# Patient Record
Sex: Male | Born: 1960 | Race: White | Hispanic: No | Smoking: Current every day smoker
Health system: Southern US, Community
[De-identification: ages and names within clinical notes are randomized; demographics above are authoritative.]

## PROBLEM LIST (undated history)

## (undated) DIAGNOSIS — F17200 Nicotine dependence, unspecified, uncomplicated: Secondary | ICD-10-CM

## (undated) DIAGNOSIS — Z8639 Personal history of other endocrine, nutritional and metabolic disease: Secondary | ICD-10-CM

## (undated) DIAGNOSIS — Z862 Personal history of diseases of the blood and blood-forming organs and certain disorders involving the immune mechanism: Secondary | ICD-10-CM

## (undated) DIAGNOSIS — N183 Chronic kidney disease, stage 3 (moderate): Secondary | ICD-10-CM

## (undated) DIAGNOSIS — I2510 Atherosclerotic heart disease of native coronary artery without angina pectoris: Secondary | ICD-10-CM

## (undated) DIAGNOSIS — E7800 Pure hypercholesterolemia, unspecified: Secondary | ICD-10-CM

## (undated) DIAGNOSIS — E782 Mixed hyperlipidemia: Secondary | ICD-10-CM

## (undated) DIAGNOSIS — I219 Acute myocardial infarction, unspecified: Secondary | ICD-10-CM

## (undated) DIAGNOSIS — E119 Type 2 diabetes mellitus without complications: Secondary | ICD-10-CM

## (undated) DIAGNOSIS — I10 Essential (primary) hypertension: Secondary | ICD-10-CM

## (undated) DIAGNOSIS — K219 Gastro-esophageal reflux disease without esophagitis: Secondary | ICD-10-CM

## (undated) DIAGNOSIS — E118 Type 2 diabetes mellitus with unspecified complications: Secondary | ICD-10-CM

## (undated) DIAGNOSIS — I639 Cerebral infarction, unspecified: Secondary | ICD-10-CM

## (undated) DIAGNOSIS — I739 Peripheral vascular disease, unspecified: Secondary | ICD-10-CM

## (undated) DIAGNOSIS — M1990 Unspecified osteoarthritis, unspecified site: Secondary | ICD-10-CM

## (undated) DIAGNOSIS — D649 Anemia, unspecified: Secondary | ICD-10-CM

## (undated) HISTORY — DX: Personal history of diseases of the blood and blood-forming organs and certain disorders involving the immune mechanism: Z86.2

## (undated) HISTORY — DX: Cerebral infarction, unspecified: I63.9

## (undated) HISTORY — DX: Atherosclerotic heart disease of native coronary artery without angina pectoris: I25.10

## (undated) HISTORY — DX: Personal history of other endocrine, nutritional and metabolic disease: Z86.39

## (undated) HISTORY — DX: Acute myocardial infarction, unspecified: I21.9

## (undated) HISTORY — PX: TONSILLECTOMY: SUR1361

## (undated) HISTORY — DX: Chronic kidney disease, stage 3 (moderate): N18.3

## (undated) HISTORY — DX: Anemia, unspecified: D64.9

## (undated) HISTORY — DX: Type 2 diabetes mellitus with unspecified complications: E11.8

## (undated) HISTORY — PX: KNEE ARTHROSCOPY: SHX127

## (undated) HISTORY — DX: Unspecified osteoarthritis, unspecified site: M19.90

## (undated) HISTORY — DX: Nicotine dependence, unspecified, uncomplicated: F17.200

## (undated) HISTORY — PX: BELOW KNEE LEG AMPUTATION: SUR23

## (undated) HISTORY — DX: Type 2 diabetes mellitus without complications: E11.9

## (undated) HISTORY — PX: TOE AMPUTATION: SHX809

## (undated) HISTORY — DX: Peripheral vascular disease, unspecified: I73.9

## (undated) HISTORY — DX: Mixed hyperlipidemia: E78.2

## (undated) HISTORY — DX: Pure hypercholesterolemia, unspecified: E78.00

## (undated) HISTORY — PX: HAND SURGERY: SHX662

## (undated) HISTORY — DX: Essential (primary) hypertension: I10

---

## 2004-08-08 DIAGNOSIS — I219 Acute myocardial infarction, unspecified: Secondary | ICD-10-CM

## 2004-08-08 HISTORY — DX: Acute myocardial infarction, unspecified: I21.9

## 2004-08-08 HISTORY — PX: CARDIAC CATHETERIZATION: SHX172

## 2009-03-13 ENCOUNTER — Emergency Department (HOSPITAL_COMMUNITY): Admission: EM | Admit: 2009-03-13 | Discharge: 2009-03-13 | Payer: Self-pay | Admitting: Emergency Medicine

## 2009-03-14 ENCOUNTER — Ambulatory Visit (HOSPITAL_COMMUNITY): Admission: RE | Admit: 2009-03-14 | Discharge: 2009-03-14 | Payer: Self-pay | Admitting: Emergency Medicine

## 2009-03-14 ENCOUNTER — Ambulatory Visit: Payer: Self-pay | Admitting: Vascular Surgery

## 2009-03-14 ENCOUNTER — Encounter (INDEPENDENT_AMBULATORY_CARE_PROVIDER_SITE_OTHER): Payer: Self-pay | Admitting: Emergency Medicine

## 2009-06-22 ENCOUNTER — Encounter: Admission: RE | Admit: 2009-06-22 | Discharge: 2009-08-06 | Payer: Self-pay | Admitting: Orthopedic Surgery

## 2009-08-12 ENCOUNTER — Encounter: Admission: RE | Admit: 2009-08-12 | Discharge: 2009-11-10 | Payer: Self-pay | Admitting: Orthopedic Surgery

## 2010-10-27 DIAGNOSIS — I252 Old myocardial infarction: Secondary | ICD-10-CM | POA: Insufficient documentation

## 2012-12-20 DIAGNOSIS — Z955 Presence of coronary angioplasty implant and graft: Secondary | ICD-10-CM | POA: Insufficient documentation

## 2014-09-29 DIAGNOSIS — M899 Disorder of bone, unspecified: Secondary | ICD-10-CM | POA: Insufficient documentation

## 2014-09-29 DIAGNOSIS — M79644 Pain in right finger(s): Secondary | ICD-10-CM | POA: Insufficient documentation

## 2016-09-20 DIAGNOSIS — Z8673 Personal history of transient ischemic attack (TIA), and cerebral infarction without residual deficits: Secondary | ICD-10-CM | POA: Insufficient documentation

## 2017-04-24 DIAGNOSIS — E1140 Type 2 diabetes mellitus with diabetic neuropathy, unspecified: Secondary | ICD-10-CM | POA: Insufficient documentation

## 2017-04-24 DIAGNOSIS — Z794 Long term (current) use of insulin: Secondary | ICD-10-CM | POA: Insufficient documentation

## 2017-05-12 HISTORY — PX: FEMORAL-POPLITEAL BYPASS GRAFT: SHX937

## 2017-06-15 DIAGNOSIS — D649 Anemia, unspecified: Secondary | ICD-10-CM | POA: Insufficient documentation

## 2017-06-15 DIAGNOSIS — D62 Acute posthemorrhagic anemia: Secondary | ICD-10-CM | POA: Insufficient documentation

## 2017-06-15 DIAGNOSIS — R778 Other specified abnormalities of plasma proteins: Secondary | ICD-10-CM | POA: Insufficient documentation

## 2017-06-15 DIAGNOSIS — I5032 Chronic diastolic (congestive) heart failure: Secondary | ICD-10-CM | POA: Insufficient documentation

## 2017-09-15 DIAGNOSIS — K922 Gastrointestinal hemorrhage, unspecified: Secondary | ICD-10-CM | POA: Insufficient documentation

## 2017-10-12 DIAGNOSIS — K5903 Drug induced constipation: Secondary | ICD-10-CM | POA: Insufficient documentation

## 2017-10-12 DIAGNOSIS — F17200 Nicotine dependence, unspecified, uncomplicated: Secondary | ICD-10-CM | POA: Insufficient documentation

## 2017-10-12 DIAGNOSIS — K611 Rectal abscess: Secondary | ICD-10-CM | POA: Insufficient documentation

## 2017-10-16 DIAGNOSIS — K610 Anal abscess: Secondary | ICD-10-CM | POA: Insufficient documentation

## 2018-02-01 DIAGNOSIS — M6282 Rhabdomyolysis: Secondary | ICD-10-CM | POA: Insufficient documentation

## 2018-02-01 DIAGNOSIS — E876 Hypokalemia: Secondary | ICD-10-CM | POA: Insufficient documentation

## 2018-04-10 DIAGNOSIS — R9439 Abnormal result of other cardiovascular function study: Secondary | ICD-10-CM | POA: Insufficient documentation

## 2018-06-07 DIAGNOSIS — Z2820 Immunization not carried out because of patient decision for unspecified reason: Secondary | ICD-10-CM | POA: Insufficient documentation

## 2018-06-08 DIAGNOSIS — G8929 Other chronic pain: Secondary | ICD-10-CM | POA: Insufficient documentation

## 2018-09-26 ENCOUNTER — Ambulatory Visit: Payer: Self-pay | Admitting: Osteopathic Medicine

## 2018-10-11 ENCOUNTER — Encounter: Payer: Self-pay | Admitting: Osteopathic Medicine

## 2018-10-11 ENCOUNTER — Ambulatory Visit (INDEPENDENT_AMBULATORY_CARE_PROVIDER_SITE_OTHER): Payer: Medicare HMO | Admitting: Osteopathic Medicine

## 2018-10-11 DIAGNOSIS — N183 Chronic kidney disease, stage 3 unspecified: Secondary | ICD-10-CM

## 2018-10-11 DIAGNOSIS — F17200 Nicotine dependence, unspecified, uncomplicated: Secondary | ICD-10-CM

## 2018-10-11 DIAGNOSIS — I10 Essential (primary) hypertension: Secondary | ICD-10-CM

## 2018-10-11 DIAGNOSIS — Z794 Long term (current) use of insulin: Secondary | ICD-10-CM

## 2018-10-11 DIAGNOSIS — E782 Mixed hyperlipidemia: Secondary | ICD-10-CM | POA: Diagnosis not present

## 2018-10-11 DIAGNOSIS — I739 Peripheral vascular disease, unspecified: Secondary | ICD-10-CM

## 2018-10-11 DIAGNOSIS — E118 Type 2 diabetes mellitus with unspecified complications: Secondary | ICD-10-CM

## 2018-10-11 DIAGNOSIS — M1990 Unspecified osteoarthritis, unspecified site: Secondary | ICD-10-CM | POA: Diagnosis not present

## 2018-10-11 DIAGNOSIS — Z862 Personal history of diseases of the blood and blood-forming organs and certain disorders involving the immune mechanism: Secondary | ICD-10-CM | POA: Diagnosis not present

## 2018-10-11 DIAGNOSIS — I2510 Atherosclerotic heart disease of native coronary artery without angina pectoris: Secondary | ICD-10-CM | POA: Diagnosis not present

## 2018-10-11 DIAGNOSIS — N184 Chronic kidney disease, stage 4 (severe): Secondary | ICD-10-CM | POA: Insufficient documentation

## 2018-10-11 HISTORY — DX: Chronic kidney disease, stage 3 unspecified: N18.30

## 2018-10-11 HISTORY — DX: Personal history of diseases of the blood and blood-forming organs and certain disorders involving the immune mechanism: Z86.2

## 2018-10-11 HISTORY — DX: Mixed hyperlipidemia: E78.2

## 2018-10-11 HISTORY — DX: Peripheral vascular disease, unspecified: I73.9

## 2018-10-11 HISTORY — DX: Nicotine dependence, unspecified, uncomplicated: F17.200

## 2018-10-11 HISTORY — DX: Essential (primary) hypertension: I10

## 2018-10-11 HISTORY — DX: Atherosclerotic heart disease of native coronary artery without angina pectoris: I25.10

## 2018-10-11 HISTORY — DX: Type 2 diabetes mellitus with unspecified complications: E11.8

## 2018-10-11 MED ORDER — ASPIRIN 81 MG PO TABS: 81.0000 mg | ORAL_TABLET | Freq: Every day | ORAL | 3 refills | Status: AC

## 2018-10-11 MED ORDER — FERROUS SULFATE 325 (65 FE) MG PO TABS: 325.0000 mg | ORAL_TABLET | Freq: Two times a day (BID) | ORAL | 3 refills | Status: DC

## 2018-10-11 MED ORDER — PANTOPRAZOLE SODIUM 40 MG PO TBEC: 40.0000 mg | DELAYED_RELEASE_TABLET | Freq: Every day | ORAL | 3 refills | Status: DC

## 2018-10-11 MED ORDER — FUROSEMIDE 20 MG PO TABS: 20.0000 mg | ORAL_TABLET | Freq: Every day | ORAL | 3 refills | Status: DC

## 2018-10-11 MED ORDER — CLOPIDOGREL BISULFATE 75 MG PO TABS: 75.0000 mg | ORAL_TABLET | Freq: Every day | ORAL | 3 refills | Status: DC

## 2018-10-11 MED ORDER — CILOSTAZOL 100 MG PO TABS: 100.0000 mg | ORAL_TABLET | Freq: Two times a day (BID) | ORAL | 3 refills | Status: DC

## 2018-10-11 MED ORDER — POTASSIUM CHLORIDE ER 10 MEQ PO TBCR: 10.0000 meq | EXTENDED_RELEASE_TABLET | Freq: Every day | ORAL | 3 refills | Status: DC

## 2018-10-11 MED ORDER — OXYCODONE HCL 10 MG PO TABS: 5.0000 mg | ORAL_TABLET | Freq: Four times a day (QID) | ORAL | 0 refills | Status: DC | PRN

## 2018-10-11 NOTE — Patient Instructions (Signed)
Plan:  Please return to the lab downstairs for fasting blood work sometime within the next week.    Based on those results, we may think about adjusting your medications, particularly adding a statin medicine for cholesterol and prevention of heart attack/stroke, to others to prevent heart attack / stroke and reduce risk of complications from uncontrolled blood pressure.  Refills on chronic medications were sent to [facility].  You should have a years worth of everything.  Sent a refill of the pain medication locally to [place].  Let us plan to see you back in a couple of weeks after we adjust your medications, we can recheck your blood pressure and you lab results in detail.  After that, as long as you are feeling well, can probably see me every 6 months or so, or as needed if anything new comes up.  Thanks for coming in today, and please let me know if there is anything else I can do for you!

## 2018-10-11 NOTE — Progress Notes (Signed)
HPI: Joseph Grimes is a 58 y.o. male who  has a past medical history of CKD (chronic kidney disease) stage 3, GFR 30-59 ml/min ([place]) (10/11/2018), Coronary artery disease (10/11/2018), Diabetes ([place]), Diabetes mellitus type 2, controlled, with complications ([place]) (10/09/1962), Essential hypertension (10/11/2018), Heart attack ([place]), High cholesterol, History of anemia (10/11/2018), History of low potassium, Low hemoglobin, Mixed hyperlipidemia (10/11/2018), Osteoarthritis (10/11/2018), Peripheral vascular disease ([place]) (10/11/2018), Stroke ([place]), and Tobacco dependence (10/11/2018).  he presents to [facility] today, 10/11/18,  for chief complaint of: New to establish - SEE HEADINGS   CARDIOVASCULAR History of coronary artery disease: History of heart attack and stroke.  Patient is on aspirin 81 mg, Plavix 75 mg.  Peripheral vascular disease, following with vascular surgery.  Taking cilostazol 100 mg twice daily.  Reports needs refills on furosemide 20 mg, takes this once daily.  I do not see an ACE inhibitor/ARB or statin on his list.  History of high cholesterol.  Last lipid panel I see on file is from 08/07/2017.  History of hypertension, blood pressure on intake was 157/72, and on recheck was 159/76, patient reports he took his medications this morning at around 10:00  RESPIRATORY Patient is a smoker, about a half a pack a day for 45 years.  Not interested in quitting.  No previous diagnosis of COPD, has rescue inhaler he rarely uses.   NEUROLOGICAL Hx CVA w/ residual R sided weakness.   RENAL  Hx CKD3, pt states previously on lisinopril, stopped for unknown reason.   GASTROINTESTINAL Reports colonoscopy was attempted sometime last year but obstruction complicated the procedure. History of GERD, taking pantoprazole 40 mg daily.  ENDOCRINE Type 2 diabetes, following with [facility] end[facility], last visit 07/16/2018.  At that point, plan was to continue Novolin 70/30, 24  units before breakfast and before supper, follow-up in 3 months.  A1c at that visit was 6.5.  Previously 9.7 on 02/02/2018.  MUSCULOSKELETAL/RHEUM Occasional Oxycodone use, PDMP reviewed, last fill #20 in 02/2018  HEMONC Taking ferrous sulfate 325 mg twice daily.  INFECTIOUS DISEASE  Declines all vaccinations.       Past medical, surgical, social and family history reviewed:  Patient Active Problem List   Diagnosis Date Noted  . Essential hypertension 10/11/2018  . Coronary artery disease 10/11/2018  . Peripheral vascular disease ([place]) 10/11/2018  . Diabetes mellitus type 2, controlled, with complications ([place]) 22/97/9892  . Mixed hyperlipidemia 10/11/2018  . Tobacco dependence 10/11/2018  . Osteoarthritis 10/11/2018  . CKD (chronic kidney disease) stage 3, GFR 30-59 ml/min ([facility]) 10/11/2018  . History of anemia 10/11/2018    History reviewed. No pertinent surgical history.  Social History   Tobacco Use  . Smoking status: Current Every Day Smoker  . Smokeless tobacco: Never Used  Substance Use Topics  . Alcohol use: Not Currently    Frequency: Never    Family History  Problem Relation Age of Onset  . Diabetes Sister   . Diabetes Brother      Current medication list and allergy/intolerance information reviewed:    Current Outpatient Medications  Medication Sig Dispense Refill  . aspirin 81 MG tablet Take 1 tablet (81 mg total) by mouth daily. 90 tablet 3  . cilostazol (PLETAL) 100 MG tablet Take 1 tablet (100 mg total) by mouth 2 (two) times daily. 180 tablet 3  . clopidogrel (PLAVIX) 75 MG tablet Take 1 tablet (75 mg total) by mouth daily. 90 tablet 3  . cyanocobalamin ([employer] VITAMIN B12)  1000 MCG tablet Take by mouth.    . ferrous sulfate 325 (65 FE) MG tablet Take 1 tablet (325 mg total) by mouth 2 (two) times daily with a meal. 180 tablet 3  . furosemide (LASIX) 20 MG tablet Take 1 tablet (20 mg total) by mouth daily. 90 tablet 3  . insulin NPH-regular Human  (70-30) 100 UNIT/ML injection Inject into the skin.    . Oxycodone HCl 10 MG TABS Take 0.5-1 tablets (5-10 mg total) by mouth every 6 (six) hours as needed. 20 tablet 0  . pantoprazole (PROTONIX) 40 MG tablet Take 1 tablet (40 mg total) by mouth daily. 90 tablet 3  . potassium chloride (K-DUR) 10 MEQ tablet Take 1 tablet (10 mEq total) by mouth daily. 90 tablet 3  . rosuvastatin (CRESTOR) 40 MG tablet      No current facility-administered medications for this visit.     No Known Allergies    Review of Systems:  Constitutional:  No  fever, no chills, No recent illness, No unintentional weight changes. No significant fatigue.   HEENT: No  headache, no vision change, no hearing change, No sore throat, No  sinus pressure  Cardiac: No  chest pain, No  pressure, No palpitations, No  Orthopnea  Respiratory:  No  shortness of breath. No  Cough  Gastrointestinal: No  abdominal pain, No  nausea, No  vomiting,  No  blood in stool, No  diarrhea, No  constipation   Musculoskeletal: No new myalgia/arthralgia  Skin: No  Rash, No other wounds/concerning lesions  Genitourinary: No  incontinence, No  abnormal genital bleeding, No abnormal genital discharge  Hem/Onc: No  easy bruising/bleeding, No  abnormal lymph node  Endocrine: No cold intolerance,  No heat intolerance. No polyuria/polydipsia/polyphagia   Neurologic: No  weakness, No  dizziness, No  slurred speech/focal weakness/facial droop  Psychiatric: No  concerns with depression, No  concerns with anxiety, No sleep problems, No mood problems  Exam:  BP (!) 159/76 (BP Location: Left Arm, Patient Position: Sitting, Cuff Size: Normal)   Pulse 90   Temp 97.7 F (36.5 C) (Oral)   Ht 5\' 9"  (1.753 m)   Wt 172 lb 9.6 oz (78.3 kg)   BMI 25.49 kg/m   Constitutional: VS see above. General Appearance: alert, well-developed, well-nourished, NAD  Eyes: Normal lids and conjunctive, non-icteric sclera  Ears, Nose, Mouth, Throat: MMM, Normal  external inspection ears/nares/mouth/lips/gums. TM normal bilaterally. Pharynx/tonsils no erythema, no exudate. Nasal mucosa normal.   Neck: No masses, trachea midline. No thyroid enlargement. No tenderness/mass appreciated. No lymphadenopathy  Respiratory: Normal respiratory effort. no wheeze, no rhonchi, no rales  Cardiovascular: S1/S2 normal, no murmur, no rub/gallop auscultated. RRR. No lower extremity edema.   Gastrointestinal: Nontender, no masses. No hepatomegaly, no splenomegaly. No hernia appreciated. Bowel sounds normal. Rectal exam deferred.   Musculoskeletal: Gait normal. No clubbing/cyanosis of digits.   Neurological: Normal balance/coordination. No tremor. No cranial nerve deficit on limited exam. Motor and sensation intact and symmetric. Cerebellar reflexes intact.   Skin: warm, dry, intact. No rash/ulcer. No concerning nevi or subq nodules on limited exam.    Psychiatric: Normal judgment/insight. Normal mood and affect. Oriented x3.      ASSESSMENT/PLAN: Diagnoses of Essential hypertension, Coronary artery disease involving native heart without angina pectoris, unspecified vessel or lesion type, Peripheral vascular disease ([place]), Controlled type 2 diabetes mellitus with complication, with long-term current use of insulin ([place]), Mixed hyperlipidemia, Tobacco dependence, Osteoarthritis, unspecified osteoarthritis type, unspecified site, CKD (chronic  kidney disease) stage 3, GFR 30-59 ml/min (HCC), and History of anemia were pertinent to this visit.   Orders Placed This Encounter  Procedures  . CBC  . COMPLETE METABOLIC PANEL WITH GFR  . Lipid panel    Meds ordered this encounter  Medications  . aspirin 81 MG tablet    Sig: Take 1 tablet (81 mg total) by mouth daily.    Dispense:  90 tablet    Refill:  3  . cilostazol (PLETAL) 100 MG tablet    Sig: Take 1 tablet (100 mg total) by mouth 2 (two) times daily.    Dispense:  180 tablet    Refill:  3  . clopidogrel  (PLAVIX) 75 MG tablet    Sig: Take 1 tablet (75 mg total) by mouth daily.    Dispense:  90 tablet    Refill:  3  . ferrous sulfate 325 (65 FE) MG tablet    Sig: Take 1 tablet (325 mg total) by mouth 2 (two) times daily with a meal.    Dispense:  180 tablet    Refill:  3  . furosemide (LASIX) 20 MG tablet    Sig: Take 1 tablet (20 mg total) by mouth daily.    Dispense:  90 tablet    Refill:  3  . pantoprazole (PROTONIX) 40 MG tablet    Sig: Take 1 tablet (40 mg total) by mouth daily.    Dispense:  90 tablet    Refill:  3  . potassium chloride (K-DUR) 10 MEQ tablet    Sig: Take 1 tablet (10 mEq total) by mouth daily.    Dispense:  90 tablet    Refill:  3  . Oxycodone HCl 10 MG TABS    Sig: Take 0.5-1 tablets (5-10 mg total) by mouth every 6 (six) hours as needed.    Dispense:  20 tablet    Refill:  0    Patient Instructions  Plan:  Please return to the lab downstairs for fasting blood work sometime within the next week.    Based on those results, we may think about adjusting your medications, particularly adding a statin medicine for cholesterol and prevention of heart attack/stroke, to others to prevent heart attack / stroke and reduce risk of complications from uncontrolled blood pressure.  Refills on chronic medications were sent to [facility].  You should have a years worth of everything.  Sent a refill of the pain medication locally to [place].  Let us plan to see you back in a couple of weeks after we adjust your medications, we can recheck your blood pressure and you lab results in detail.  After that, as long as you are feeling well, can probably see me every 6 months or so, or as needed if anything new comes up.  Thanks for coming in today, and please let me know if there is anything else I can do for you!         Visit summary with medication list and pertinent instructions was printed for patient to review. All questions at time of visit were answered - patient  instructed to contact office with any additional concerns or updates. ER/RTC precautions were reviewed with the patient.   Note: Total time spent 60 minutes, greater than 50% of the visit was spent face-to-face counseling and coordinating care for the above diagnoses listed in assessment/plan.   Please note: voice recognition software was used to produce this document, and typos may escape review. Please contact  Dr. Sheppard Coil for any needed clarifications.     Follow-up plan: Return for Follow-up on blood pressure and review lab results in 2 weeks with Dr. Loni Muse.

## 2018-10-12 DIAGNOSIS — I739 Peripheral vascular disease, unspecified: Secondary | ICD-10-CM | POA: Diagnosis not present

## 2018-10-12 DIAGNOSIS — Z794 Long term (current) use of insulin: Secondary | ICD-10-CM | POA: Diagnosis not present

## 2018-10-12 DIAGNOSIS — I10 Essential (primary) hypertension: Secondary | ICD-10-CM | POA: Diagnosis not present

## 2018-10-12 DIAGNOSIS — F17200 Nicotine dependence, unspecified, uncomplicated: Secondary | ICD-10-CM | POA: Diagnosis not present

## 2018-10-12 DIAGNOSIS — M1990 Unspecified osteoarthritis, unspecified site: Secondary | ICD-10-CM | POA: Diagnosis not present

## 2018-10-12 DIAGNOSIS — I2510 Atherosclerotic heart disease of native coronary artery without angina pectoris: Secondary | ICD-10-CM | POA: Diagnosis not present

## 2018-10-12 DIAGNOSIS — N183 Chronic kidney disease, stage 3 (moderate): Secondary | ICD-10-CM | POA: Diagnosis not present

## 2018-10-12 DIAGNOSIS — E782 Mixed hyperlipidemia: Secondary | ICD-10-CM | POA: Diagnosis not present

## 2018-10-12 DIAGNOSIS — Z862 Personal history of diseases of the blood and blood-forming organs and certain disorders involving the immune mechanism: Secondary | ICD-10-CM | POA: Diagnosis not present

## 2018-10-12 DIAGNOSIS — E118 Type 2 diabetes mellitus with unspecified complications: Secondary | ICD-10-CM | POA: Diagnosis not present

## 2018-10-20 LAB — CBC
HCT: 43.9 % (ref 38.5–50.0)
Hemoglobin: 14.7 g/dL (ref 13.2–17.1)
MCH: 29.9 pg (ref 27.0–33.0)
MCHC: 33.5 g/dL (ref 32.0–36.0)
MCV: 89.2 fL (ref 80.0–100.0)
MPV: 11.5 fL (ref 7.5–12.5)
Platelets: 293 10*3/uL (ref 140–400)
RBC: 4.92 10*6/uL (ref 4.20–5.80)
RDW: 12.4 % (ref 11.0–15.0)
WBC: 11.6 10*3/uL — ABNORMAL HIGH (ref 3.8–10.8)

## 2018-10-20 LAB — COMPLETE METABOLIC PANEL WITH GFR
AG Ratio: 0.8 (calc) — ABNORMAL LOW (ref 1.0–2.5)
ALT: 14 U/L (ref 9–46)
AST: 14 U/L (ref 10–35)
Albumin: 2.5 g/dL — ABNORMAL LOW (ref 3.6–5.1)
Alkaline phosphatase (APISO): 161 U/L — ABNORMAL HIGH (ref 35–144)
BUN/Creatinine Ratio: 11 (calc) (ref 6–22)
BUN: 18 mg/dL (ref 7–25)
CO2: 25 mmol/L (ref 20–32)
Calcium: 8.8 mg/dL (ref 8.6–10.3)
Chloride: 97 mmol/L — ABNORMAL LOW (ref 98–110)
Creat: 1.71 mg/dL — ABNORMAL HIGH (ref 0.70–1.33)
GFR, Est African American: 50 mL/min/{1.73_m2} — ABNORMAL LOW (ref >=60)
GFR, Est Non African American: 43 mL/min/{1.73_m2} — ABNORMAL LOW (ref >=60)
Globulin: 3.1 g/dL (calc) (ref 1.9–3.7)
Glucose, Bld: 492 mg/dL — ABNORMAL HIGH (ref 65–99)
Potassium: 4.4 mmol/L (ref 3.5–5.3)
Sodium: 130 mmol/L — ABNORMAL LOW (ref 135–146)
Total Bilirubin: 0.3 mg/dL (ref 0.2–1.2)
Total Protein: 5.6 g/dL — ABNORMAL LOW (ref 6.1–8.1)

## 2018-10-20 LAB — TEST AUTHORIZATION

## 2018-10-20 LAB — LIPID PANEL
CHOL/HDL RATIO: 8.5 (calc) — AB (ref <5.0)
Cholesterol: 392 mg/dL — ABNORMAL HIGH (ref <200)
HDL: 46 mg/dL (ref >=40)
LDL Cholesterol (Calc): 303 mg/dL (calc) — ABNORMAL HIGH
Non-HDL Cholesterol (Calc): 346 mg/dL (calc) — ABNORMAL HIGH (ref <130)
TRIGLYCERIDES: 233 mg/dL — AB (ref <150)

## 2018-10-20 LAB — HEMOGLOBIN A1C W/OUT EAG: HEMOGLOBIN A1C: 12.2 %{Hb} — AB (ref <5.7)

## 2018-10-25 ENCOUNTER — Ambulatory Visit: Payer: Medicare HMO | Admitting: Osteopathic Medicine

## 2018-11-09 DIAGNOSIS — I69351 Hemiplegia and hemiparesis following cerebral infarction affecting right dominant side: Secondary | ICD-10-CM | POA: Diagnosis not present

## 2018-11-09 DIAGNOSIS — Z7902 Long term (current) use of antithrombotics/antiplatelets: Secondary | ICD-10-CM | POA: Diagnosis not present

## 2018-11-09 DIAGNOSIS — G6289 Other specified polyneuropathies: Secondary | ICD-10-CM | POA: Diagnosis not present

## 2018-11-09 DIAGNOSIS — I69398 Other sequelae of cerebral infarction: Secondary | ICD-10-CM | POA: Diagnosis not present

## 2018-11-09 DIAGNOSIS — E113399 Type 2 diabetes mellitus with moderate nonproliferative diabetic retinopathy without macular edema, unspecified eye: Secondary | ICD-10-CM | POA: Diagnosis not present

## 2018-11-09 DIAGNOSIS — Z95820 Peripheral vascular angioplasty status with implants and grafts: Secondary | ICD-10-CM | POA: Diagnosis not present

## 2018-11-09 DIAGNOSIS — Z7982 Long term (current) use of aspirin: Secondary | ICD-10-CM | POA: Diagnosis not present

## 2018-11-09 DIAGNOSIS — F1120 Opioid dependence, uncomplicated: Secondary | ICD-10-CM | POA: Diagnosis not present

## 2018-11-09 DIAGNOSIS — E1122 Type 2 diabetes mellitus with diabetic chronic kidney disease: Secondary | ICD-10-CM | POA: Diagnosis not present

## 2018-11-09 DIAGNOSIS — Z89421 Acquired absence of other right toe(s): Secondary | ICD-10-CM | POA: Diagnosis not present

## 2018-11-09 DIAGNOSIS — Z794 Long term (current) use of insulin: Secondary | ICD-10-CM | POA: Diagnosis not present

## 2018-11-09 DIAGNOSIS — N183 Chronic kidney disease, stage 3 (moderate): Secondary | ICD-10-CM | POA: Diagnosis not present

## 2018-11-09 DIAGNOSIS — E1151 Type 2 diabetes mellitus with diabetic peripheral angiopathy without gangrene: Secondary | ICD-10-CM | POA: Diagnosis not present

## 2018-12-27 ENCOUNTER — Ambulatory Visit (INDEPENDENT_AMBULATORY_CARE_PROVIDER_SITE_OTHER): Payer: Medicare HMO | Admitting: Osteopathic Medicine

## 2018-12-27 ENCOUNTER — Encounter: Payer: Self-pay | Admitting: Osteopathic Medicine

## 2018-12-27 ENCOUNTER — Other Ambulatory Visit: Payer: Self-pay

## 2018-12-27 VITALS — BP 145/60 | HR 80 | Temp 97.6°F | Wt 175.3 lb

## 2018-12-27 DIAGNOSIS — N183 Chronic kidney disease, stage 3 unspecified: Secondary | ICD-10-CM

## 2018-12-27 DIAGNOSIS — Z125 Encounter for screening for malignant neoplasm of prostate: Secondary | ICD-10-CM | POA: Diagnosis not present

## 2018-12-27 DIAGNOSIS — E1165 Type 2 diabetes mellitus with hyperglycemia: Secondary | ICD-10-CM

## 2018-12-27 DIAGNOSIS — E559 Vitamin D deficiency, unspecified: Secondary | ICD-10-CM

## 2018-12-27 DIAGNOSIS — I493 Ventricular premature depolarization: Secondary | ICD-10-CM | POA: Insufficient documentation

## 2018-12-27 DIAGNOSIS — I499 Cardiac arrhythmia, unspecified: Secondary | ICD-10-CM

## 2018-12-27 DIAGNOSIS — I739 Peripheral vascular disease, unspecified: Secondary | ICD-10-CM | POA: Diagnosis not present

## 2018-12-27 DIAGNOSIS — H532 Diplopia: Secondary | ICD-10-CM | POA: Diagnosis not present

## 2018-12-27 DIAGNOSIS — N529 Male erectile dysfunction, unspecified: Secondary | ICD-10-CM

## 2018-12-27 DIAGNOSIS — I2510 Atherosclerotic heart disease of native coronary artery without angina pectoris: Secondary | ICD-10-CM

## 2018-12-27 MED ORDER — LISINOPRIL 5 MG PO TABS: 5.0000 mg | ORAL_TABLET | Freq: Every day | ORAL | 3 refills | Status: DC

## 2018-12-27 NOTE — Progress Notes (Signed)
HPI: Joseph Grimes is a 58 y.o. male who  has a past medical history of CKD (chronic kidney disease) stage 3, GFR 30-59 ml/min ([place]) (10/11/2018), Coronary artery disease (10/11/2018), Diabetes ([place]), Diabetes mellitus type 2, controlled, with complications ([place]) (10/07/5174), Essential hypertension (10/11/2018), Heart attack ([place]), High cholesterol, History of anemia (10/11/2018), History of low potassium, Low hemoglobin, Mixed hyperlipidemia (10/11/2018), Osteoarthritis (10/11/2018), Peripheral vascular disease ([place]) (10/11/2018), Stroke ([place]), and Tobacco dependence (10/11/2018).  he presents to [facility] today, 12/27/18,  for chief complaint of:  Follow-up diabetes   Was seen 10/11/2018, was advised to RTC 2 weeks after that for discussion of labs and medications, he has not followed up until now, about 2.5 mos later.   DM2: Type 2 diabetes, was following with [facility], last visit 07/16/2018.  At that point, plan was to continue Novolin 70/30, 24 units before breakfast and before supper, follow-up in 3 months.  A1c at that visit was 6.5.  Previously 9.7 on 02/02/2018. We checked it at 12.2, he had been out of insulin for awhile, is back to injections of 25 units bid. Cholesterol was horrible. LDL was >300.   ED: Has been on as needed Viagra and Cialis in the past, these were not particularly helpful for him.  Has not tried daily Cialis and would like to try this medication.  Blurry/double vision: No vision loss, reports can see okay out of 1 eye but when he looks out of both there is a bit of double vision.  No dizziness or headache, no falls.  Has been over a year since ophthalmology visit  Slightly irregular heart rate on auscultation, see below.  Patient denies palpitations, chest pain, trouble breathing   At today's visit 12/28/18 ... PMH, PSH, FH reviewed and updated as needed.  Current medication list and allergy/intolerance hx reviewed and updated as  needed. (See remainder of HPI, ROS, Phys Exam below)   No results found.  Results for orders placed or performed in visit on 12/27/18 (from the past 72 hour(s))  CBC with Differential/Platelet     Status: Abnormal   Collection Time: 12/27/18 11:56 AM  Result Value Ref Range   WBC 10.7 3.8 - 10.8 Thousand/uL   RBC 4.85 4.20 - 5.80 Million/uL   Hemoglobin 14.4 13.2 - 17.1 g/dL   HCT 44.1 38.5 - 50.0 %   MCV 90.9 80.0 - 100.0 fL   MCH 29.7 27.0 - 33.0 pg   MCHC 32.7 32.0 - 36.0 g/dL   RDW 12.7 11.0 - 15.0 %   Platelets 331 140 - 400 Thousand/uL   MPV 11.0 7.5 - 12.5 fL   Neutro Abs 7,201 1,500 - 7,800 cells/uL   Lymphs Abs 1,680 850 - 3,900 cells/uL   Absolute Monocytes 1,081 (H) 200 - 950 cells/uL   Eosinophils Absolute 674 (H) 15 - 500 cells/uL   Basophils Absolute 64 0 - 200 cells/uL   Neutrophils Relative % 67.3 %   Total Lymphocyte 15.7 %   Monocytes Relative 10.1 %   Eosinophils Relative 6.3 %   Basophils Relative 0.6 %  Lipid Panel w/reflex Direct LDL     Status: Abnormal   Collection Time: 12/27/18 11:56 AM  Result Value Ref Range   Cholesterol 339 (H) <200 mg/dL   HDL 38 (L) > OR = 40 mg/dL   Triglycerides 300 (H) <150 mg/dL    Comment: . If a non-fasting specimen was collected, consider repeat triglyceride testing on a fasting specimen  if clinically indicated.  Yates Decamp et al. J. of Clin. Lipidol. 1245;8:099-833. Marland Kitchen    LDL Cholesterol (Calc) 250 (H) mg/dL (calc)    Comment: LDL-C levels > or = 190 mg/dL may indicate familial  hypercholesterolemia (FH). Clinical assessment and  measurement of blood lipid levels should be  considered for all first degree relatives of  patients with an FH diagnosis.  For questions about testing for familial hypercholesterolemia, please call Insurance risk surveyor at [email or web address]. Duncan Dull, et al. J National Lipid Association  Recommendations for Patient-Centered Management of  Dyslipidemia: Part 1 Journal of  Clinical Lipidology  2015;9(2), 129-169. Reference range: <100 . Desirable range <100 mg/dL for primary prevention;   <70 mg/dL for patients with CHD or diabetic patients  with > or = 2 CHD risk factors. Marland Kitchen LDL-C is now calculated using the Martin-Hopkins  calculation, which is a validated novel method providing  better accuracy than the Friedewald equation in the  estimation of LDL-C.  Cresenciano Genre et al. Annamaria Helling. 8250;539(76): 2061-2068  (http://education.[facility].com/faq/FAQ164)    Total CHOL/HDL Ratio 8.9 (H) <5.0 (calc)   Non-HDL Cholesterol (Calc) 301 (H) <130 mg/dL (calc)    Comment: Non-HDL level > or = 220 is very high and may indicate  genetic familial hypercholesterolemia (FH). Clinical  assessment and measurement of blood lipid levels  should be considered for all first-degree relatives  of patients with an FH diagnosis. . For patients with diabetes plus 1 major ASCVD risk  factor, treating to a non-HDL-C goal of <100 mg/dL  (LDL-C of <70 mg/dL) is considered a therapeutic  option.   COMPLETE METABOLIC PANEL WITH GFR     Status: Abnormal   Collection Time: 12/27/18 11:56 AM  Result Value Ref Range   Glucose, Bld 196 (H) 65 - 99 mg/dL    Comment: .            Fasting reference interval . For someone without known diabetes, a glucose value >125 mg/dL indicates that they may have diabetes and this should be confirmed with a follow-up test. .    BUN 17 7 - 25 mg/dL   Creat 1.63 (H) 0.70 - 1.33 mg/dL    Comment: For patients >89 years of age, the reference limit for Creatinine is approximately 13% higher for people identified as African-American. .    GFR, Est Non African American 45 (L) > OR = 60 mL/min/1.67m2   GFR, Est African American 53 (L) > OR = 60 mL/min/1.35m2   BUN/Creatinine Ratio 10 6 - 22 (calc)   Sodium 138 135 - 146 mmol/L   Potassium 4.8 3.5 - 5.3 mmol/L   Chloride 104 98 - 110 mmol/L   CO2 27 20 - 32 mmol/L   Calcium 9.0 8.6 - 10.3 mg/dL    Total Protein 6.0 (L) 6.1 - 8.1 g/dL   Albumin 3.0 (L) 3.6 - 5.1 g/dL   Globulin 3.0 1.9 - 3.7 g/dL (calc)   AG Ratio 1.0 1.0 - 2.5 (calc)   Total Bilirubin 0.3 0.2 - 1.2 mg/dL   Alkaline phosphatase (APISO) 118 35 - 144 U/L   AST 15 10 - 35 U/L   ALT 15 9 - 46 U/L  Fe+TIBC+Fer     Status: Abnormal   Collection Time: 12/27/18 11:56 AM  Result Value Ref Range   Iron 50 50 - 180 mcg/dL   TIBC 207 (L) 250 - 425 mcg/dL (calc)   %SAT 24 20 - 48 % (calc)   Ferritin 133 38 -  380 ng/mL  VITAMIN D 25 Hydroxy (Vit-D Deficiency, Fractures)     Status: Abnormal   Collection Time: 12/27/18 11:56 AM  Result Value Ref Range   Vit D, 25-Hydroxy 8 (L) 30 - 100 ng/mL    Comment: Vitamin D Status         25-OH Vitamin D: . Deficiency:                    <20 ng/mL Insufficiency:             20 - 29 ng/mL Optimal:                 > or = 30 ng/mL . For 25-OH Vitamin D testing on patients on  D2-supplementation and patients for whom quantitation  of D2 and D3 fractions is required, the QuestAssureD(TM) 25-OH VIT D, (D2,D3), LC/MS/MS is recommended: order  code [phone] (patients >47yrs). See Note 1 . Note 1 . For additional information, please refer to  http://education.[facility].com/faq/FAQ199  (This link is being provided for informational/ educational purposes only.)   Hemoglobin A1c     Status: Abnormal   Collection Time: 12/27/18 11:56 AM  Result Value Ref Range   Hgb A1c MFr Bld 7.9 (H) <5.7 % of total Hgb    Comment: For someone without known diabetes, a hemoglobin A1c value of 6.5% or greater indicates that they may have  diabetes and this should be confirmed with a follow-up  test. . For someone with known diabetes, a value <7% indicates  that their diabetes is well controlled and a value  greater than or equal to 7% indicates suboptimal  control. A1c targets should be individualized based on  duration of diabetes, age, comorbid conditions, and  other considerations. .  Currently, no consensus exists regarding use of hemoglobin A1c for diagnosis of diabetes for children. .    Mean Plasma Glucose 180 (calc)   eAG (mmol/L) 10.0 (calc)          ASSESSMENT/PLAN: The primary encounter diagnosis was Uncontrolled type 2 diabetes mellitus with hyperglycemia ([place]). Diagnoses of Coronary artery disease involving native heart without angina pectoris, unspecified vessel or lesion type, Peripheral vascular disease ([place]), CKD (chronic kidney disease) stage 3, GFR 30-59 ml/min (HCC), Irregular heart rate, PVC (premature ventricular contraction), Double vision, Erectile dysfunction, unspecified erectile dysfunction type, and Vitamin D deficiency were also pertinent to this visit.   A1c is improving, cholesterol really has not budged, would strongly consider PCSK9 inhibitor, will discuss with patient.  I think okay to follow-up with me in 3 months  Orders Placed This Encounter  Procedures  . CBC with Differential/Platelet  . Lipid Panel w/reflex Direct LDL  . COMPLETE METABOLIC PANEL WITH GFR  . Fe+TIBC+Fer  . VITAMIN D 25 Hydroxy (Vit-D Deficiency, Fractures)  . PSA, Total with Reflex to PSA, Free  . Hemoglobin A1c  . Ambulatory referral to Ophthalmology  . EKG 12-Lead     Meds ordered this encounter  Medications  . lisinopril (ZESTRIL) 5 MG tablet    Sig: Take 1 tablet (5 mg total) by mouth daily.    Dispense:  90 tablet    Refill:  3  . tadalafil (CIALIS) 5 MG tablet    Sig: Take 0.5-1 tablets (2.5-5 mg total) by mouth daily.    Dispense:  30 tablet    Refill:  11  . Vitamin D, Ergocalciferol, (DRISDOL) 1.25 MG (50000 UT) CAPS capsule    Sig: Take 1 capsule (50,000 Units  total) by mouth every 7 (seven) days. Take for 12 total doses(weeks) than can transition to 1000 units OTC supplement daily    Dispense:  12 capsule    Refill:  0    Patient Instructions  Plan: EKG is ok, see below for information on PVC I added a medication for blood  pressure / kidney protection Will recheck blood work  Follow-up based on blood work results!      Premature Ventricular Contraction  A premature ventricular contraction (PVC) is a common kind of irregular heartbeat (arrhythmia). These contractions are extra heartbeats that start in the ventricles of the heart and occur too early in the normal sequence. During the PVC, the heart's normal electrical pathway is not used, so the beat is shorter and less effective. In most cases, these contractions come and go and do not require treatment. What are the causes? Common causes of the condition include:  Smoking.  Drinking alcohol.  Certain medicines.  Some illegal drugs.  Stress.  Caffeine. Certain medical conditions can also cause PVCs:  Heart failure.  Heart attack, or coronary artery disease.  Heart valve problems.  Changes in minerals in the blood (electrolytes).  Low blood oxygen levels or high carbon dioxide levels. In many cases, the cause of this condition is not known. What are the signs or symptoms? The main symptom of this condition is fast or skipped heartbeats (palpitations). Other symptoms include:  Chest pain.  Shortness of breath.  Feeling tired.  Dizziness.  Difficulty exercising. In some cases, there are no symptoms. How is this diagnosed? This condition may be diagnosed based on:  Your medical history.  A physical exam. During the exam, the health care provider will check for irregular heartbeats.  Tests, such as: ? An ECG (electrocardiogram) to monitor the electrical activity of your heart. ? Ambulatory cardiac monitor. This device records your heartbeats for 24 hours or more. ? Stress tests to see how exercise affects your heart rhythm and blood supply. ? Echocardiogram. This test uses sound waves (ultrasound) to produce an image of your heart. ? Electrophysiology study (EPS). This test checks for electrical problems in your heart. How is  this treated? Treatment for this condition depends on any underlying conditions, the type of PVCs that you are having, and how much the symptoms are interfering with your daily life. Possible treatments include:  Avoiding things that cause premature contractions (triggers). These include caffeine and alcohol.  Taking medicines if symptoms are severe or if the extra heartbeats are frequent.  Getting treatment for underlying conditions that cause PVCs.  Having an implantable cardioverter defibrillator (ICD), if you are at risk for a serious arrhythmia. The ICD is a small device that is inserted into your chest to monitor your heartbeat. When it senses an irregular heartbeat, it sends a shock to bring the heartbeat back to normal.  Having a procedure to destroy the portion of the heart tissue that sends out abnormal signals (catheter ablation). In some cases, no treatment is required. Follow these instructions at home: Alcohol use   Do not drink alcohol if: ? Your health care provider tells you not to drink. ? You are pregnant, may be pregnant, or are planning to become pregnant. ? Alcohol triggers your episodes.  If you drink alcohol, limit how much you have. You may drink: ? 0-1 drink a day for women. ? 0-2 drinks a day for men.  Be aware of how much alcohol is in your drink. In the [place] one  drink equals one typical bottle of beer (12 oz), one-half glass of wine (5 oz), or one shot of hard liquor (1 oz). General instructions  Do not use any products that contain nicotine or tobacco, such as cigarettes and e-cigarettes. If you need help quitting, ask your health care provider.  Find healthy ways to manage stress. Avoid stressful situations when possible.  Exercise regularly. Ask your health care provider what type of exercise is safe for you.  Try to get at least 7-9 hours of sleep each night, or as much as recommended by your health care provider.  If caffeine triggers episodes  of PVC, do not eat, drink, or use anything with caffeine in it.  Do not use illegal drugs.  Take over-the-counter and prescription medicines only as told by your health care provider.  Keep all follow-up visits as told by your health care provider. This is important. Contact a health care provider if you:  Feel palpitations. Get help right away if you:  Have chest pain.  Have shortness of breath.  Have sweating for no reason.  Have nausea and vomiting.  Become light-headed or you faint. Summary  A premature ventricular contraction (PVC) is a common kind of irregular heartbeat (arrhythmia).  In most cases, these contractions come and go and do not require treatment.  You may need to wear an ambulatory cardiac monitor. This records your heartbeats for 24 hours or more.  Treatment depends on any underlying conditions, the type of PVCs that you are having, and how much the symptoms are interfering with your daily life. This information is not intended to replace advice given to you by your health care provider. Make sure you discuss any questions you have with your health care provider. Document Released: 03/11/2004 Document Revised: 03/09/2018 Document Reviewed: 09/12/2017 Elsevier Interactive Patient Education  2019 [employer].      Follow-up plan: Return in about 3 months (around 03/29/2019) for Recheck A1c, see me sooner if needed.                                                 ################################################# ################################################# ################################################# #################################################    Current Meds  Medication Sig  . aspirin 81 MG tablet Take 1 tablet (81 mg total) by mouth daily.  . cilostazol (PLETAL) 100 MG tablet Take 1 tablet (100 mg total) by mouth 2 (two) times daily.  . clopidogrel (PLAVIX) 75 MG tablet Take 1 tablet  (75 mg total) by mouth daily.  . cyanocobalamin ([employer] VITAMIN B12) 1000 MCG tablet Take by mouth.  . ferrous sulfate 325 (65 FE) MG tablet Take 1 tablet (325 mg total) by mouth 2 (two) times daily with a meal.  . furosemide (LASIX) 20 MG tablet Take 1 tablet (20 mg total) by mouth daily.  . insulin NPH-regular Human (70-30) 100 UNIT/ML injection Inject into the skin.  . Oxycodone HCl 10 MG TABS Take 0.5-1 tablets (5-10 mg total) by mouth every 6 (six) hours as needed.  . pantoprazole (PROTONIX) 40 MG tablet Take 1 tablet (40 mg total) by mouth daily.  . potassium chloride (K-DUR) 10 MEQ tablet Take 1 tablet (10 mEq total) by mouth daily.  . rosuvastatin (CRESTOR) 40 MG tablet     No Known Allergies     Review of Systems:  Constitutional: No recent illness  HEENT: No  headache, +  vision change  Cardiac: No  chest pain, No  pressure, No palpitations  Respiratory:  No  shortness of breath. No  Cough  Gastrointestinal: No  abdominal pain, no change on bowel habits  Musculoskeletal: No new myalgia/arthralgia  Skin: No  Rash  Hem/Onc: No  easy bruising/bleeding, No  abnormal lumps/bumps  Neurologic: No  weakness, No  Dizziness  Psychiatric: No  concerns with depression, No  concerns with anxiety  Exam:  BP (!) 145/60   Pulse 80   Temp 97.6 F (36.4 C) (Oral)   Wt 175 lb 4.8 oz (79.5 kg)   BMI 25.89 kg/m    BP Readings from Last 3 Encounters:  12/27/18 (!) 171/54  10/11/18 (!) 159/76     Constitutional: VS see above. General Appearance: alert, well-developed, well-nourished, NAD  Eyes: Normal lids and conjunctive, non-icteric sclera  Ears, Nose, Mouth, Throat: MMM, Normal external inspection ears/nares/mouth/lips/gums.  Neck: No masses, trachea midline.   Respiratory: Normal respiratory effort. no wheeze, no rhonchi, no rales  Cardiovascular: S1/S2 normal, no murmur, no rub/gallop auscultated. RRR but sounds like beats are periodically skipping.    Musculoskeletal: Gait normal. Symmetric and independent movement of all extremities  Neurological: Normal balance/coordination. No tremor.  Skin: warm, dry, intact.   Psychiatric: Normal judgment/insight. Normal mood and affect. Oriented x3.       Visit summary with medication list and pertinent instructions was printed for patient to review, patient was advised to alert [place] if any updates are needed. All questions at time of visit were answered - patient instructed to contact office with any additional concerns. ER/RTC precautions were reviewed with the patient and understanding verbalized.     Please note: voice recognition software was used to produce this document, and typos may escape review. Please contact Dr. Sheppard Coil for any needed clarifications.    Follow up plan: Return in about 3 months (around 03/29/2019) for Recheck A1c, see me sooner if needed.

## 2018-12-27 NOTE — Patient Instructions (Signed)
Plan: EKG is ok, see below for information on PVC I added a medication for blood pressure / kidney protection Will recheck blood work  Follow-up based on blood work results!      Premature Ventricular Contraction  A premature ventricular contraction (PVC) is a common kind of irregular heartbeat (arrhythmia). These contractions are extra heartbeats that start in the ventricles of the heart and occur too early in the normal sequence. During the PVC, the heart's normal electrical pathway is not used, so the beat is shorter and less effective. In most cases, these contractions come and go and do not require treatment. What are the causes? Common causes of the condition include:  Smoking.  Drinking alcohol.  Certain medicines.  Some illegal drugs.  Stress.  Caffeine. Certain medical conditions can also cause PVCs:  Heart failure.  Heart attack, or coronary artery disease.  Heart valve problems.  Changes in minerals in the blood (electrolytes).  Low blood oxygen levels or high carbon dioxide levels. In many cases, the cause of this condition is not known. What are the signs or symptoms? The main symptom of this condition is fast or skipped heartbeats (palpitations). Other symptoms include:  Chest pain.  Shortness of breath.  Feeling tired.  Dizziness.  Difficulty exercising. In some cases, there are no symptoms. How is this diagnosed? This condition may be diagnosed based on:  Your medical history.  A physical exam. During the exam, the health care provider will check for irregular heartbeats.  Tests, such as: ? An ECG (electrocardiogram) to monitor the electrical activity of your heart. ? Ambulatory cardiac monitor. This device records your heartbeats for 24 hours or more. ? Stress tests to see how exercise affects your heart rhythm and blood supply. ? Echocardiogram. This test uses sound waves (ultrasound) to produce an image of your heart. ?  Electrophysiology study (EPS). This test checks for electrical problems in your heart. How is this treated? Treatment for this condition depends on any underlying conditions, the type of PVCs that you are having, and how much the symptoms are interfering with your daily life. Possible treatments include:  Avoiding things that cause premature contractions (triggers). These include caffeine and alcohol.  Taking medicines if symptoms are severe or if the extra heartbeats are frequent.  Getting treatment for underlying conditions that cause PVCs.  Having an implantable cardioverter defibrillator (ICD), if you are at risk for a serious arrhythmia. The ICD is a small device that is inserted into your chest to monitor your heartbeat. When it senses an irregular heartbeat, it sends a shock to bring the heartbeat back to normal.  Having a procedure to destroy the portion of the heart tissue that sends out abnormal signals (catheter ablation). In some cases, no treatment is required. Follow these instructions at home: Alcohol use   Do not drink alcohol if: ? Your health care provider tells you not to drink. ? You are pregnant, may be pregnant, or are planning to become pregnant. ? Alcohol triggers your episodes.  If you drink alcohol, limit how much you have. You may drink: ? 0-1 drink a day for women. ? 0-2 drinks a day for men.  Be aware of how much alcohol is in your drink. In the [place] one drink equals one typical bottle of beer (12 oz), one-half glass of wine (5 oz), or one shot of hard liquor (1 oz). General instructions  Do not use any products that contain nicotine or tobacco, such as cigarettes and e-cigarettes.  If you need help quitting, ask your health care provider.  Find healthy ways to manage stress. Avoid stressful situations when possible.  Exercise regularly. Ask your health care provider what type of exercise is safe for you.  Try to get at least 7-9 hours of sleep each  night, or as much as recommended by your health care provider.  If caffeine triggers episodes of PVC, do not eat, drink, or use anything with caffeine in it.  Do not use illegal drugs.  Take over-the-counter and prescription medicines only as told by your health care provider.  Keep all follow-up visits as told by your health care provider. This is important. Contact a health care provider if you:  Feel palpitations. Get help right away if you:  Have chest pain.  Have shortness of breath.  Have sweating for no reason.  Have nausea and vomiting.  Become light-headed or you faint. Summary  A premature ventricular contraction (PVC) is a common kind of irregular heartbeat (arrhythmia).  In most cases, these contractions come and go and do not require treatment.  You may need to wear an ambulatory cardiac monitor. This records your heartbeats for 24 hours or more.  Treatment depends on any underlying conditions, the type of PVCs that you are having, and how much the symptoms are interfering with your daily life. This information is not intended to replace advice given to you by your health care provider. Make sure you discuss any questions you have with your health care provider. Document Released: 03/11/2004 Document Revised: 03/09/2018 Document Reviewed: 09/12/2017 Elsevier Interactive Patient Education  2019 [employer].

## 2018-12-28 DIAGNOSIS — E559 Vitamin D deficiency, unspecified: Secondary | ICD-10-CM | POA: Insufficient documentation

## 2018-12-28 DIAGNOSIS — N529 Male erectile dysfunction, unspecified: Secondary | ICD-10-CM | POA: Insufficient documentation

## 2018-12-28 LAB — CBC WITH DIFFERENTIAL/PLATELET
Absolute Monocytes: 1081 cells/uL — ABNORMAL HIGH (ref 200–950)
Basophils Absolute: 64 cells/uL (ref 0–200)
Basophils Relative: 0.6 %
Eosinophils Absolute: 674 cells/uL — ABNORMAL HIGH (ref 15–500)
Eosinophils Relative: 6.3 %
HCT: 44.1 % (ref 38.5–50.0)
Hemoglobin: 14.4 g/dL (ref 13.2–17.1)
Lymphs Abs: 1680 cells/uL (ref 850–3900)
MCH: 29.7 pg (ref 27.0–33.0)
MCHC: 32.7 g/dL (ref 32.0–36.0)
MCV: 90.9 fL (ref 80.0–100.0)
MPV: 11 fL (ref 7.5–12.5)
Monocytes Relative: 10.1 %
Neutro Abs: 7201 cells/uL (ref 1500–7800)
Neutrophils Relative %: 67.3 %
Platelets: 331 10*3/uL (ref 140–400)
RBC: 4.85 10*6/uL (ref 4.20–5.80)
RDW: 12.7 % (ref 11.0–15.0)
Total Lymphocyte: 15.7 %
WBC: 10.7 10*3/uL (ref 3.8–10.8)

## 2018-12-28 LAB — COMPLETE METABOLIC PANEL WITH GFR
AG Ratio: 1 (calc) (ref 1.0–2.5)
ALT: 15 U/L (ref 9–46)
AST: 15 U/L (ref 10–35)
Albumin: 3 g/dL — ABNORMAL LOW (ref 3.6–5.1)
Alkaline phosphatase (APISO): 118 U/L (ref 35–144)
BUN/Creatinine Ratio: 10 (calc) (ref 6–22)
BUN: 17 mg/dL (ref 7–25)
CO2: 27 mmol/L (ref 20–32)
Calcium: 9 mg/dL (ref 8.6–10.3)
Chloride: 104 mmol/L (ref 98–110)
Creat: 1.63 mg/dL — ABNORMAL HIGH (ref 0.70–1.33)
GFR, Est African American: 53 mL/min/{1.73_m2} — ABNORMAL LOW (ref >=60)
GFR, Est Non African American: 45 mL/min/{1.73_m2} — ABNORMAL LOW (ref >=60)
Globulin: 3 g/dL (calc) (ref 1.9–3.7)
Glucose, Bld: 196 mg/dL — ABNORMAL HIGH (ref 65–99)
Potassium: 4.8 mmol/L (ref 3.5–5.3)
Sodium: 138 mmol/L (ref 135–146)
Total Bilirubin: 0.3 mg/dL (ref 0.2–1.2)
Total Protein: 6 g/dL — ABNORMAL LOW (ref 6.1–8.1)

## 2018-12-28 LAB — IRON,TIBC AND FERRITIN PANEL
%SAT: 24 % (calc) (ref 20–48)
Ferritin: 133 ng/mL (ref 38–380)
Iron: 50 ug/dL (ref 50–180)
TIBC: 207 mcg/dL (calc) — ABNORMAL LOW (ref 250–425)

## 2018-12-28 LAB — LIPID PANEL W/REFLEX DIRECT LDL
Cholesterol: 339 mg/dL — ABNORMAL HIGH
HDL: 38 mg/dL — ABNORMAL LOW
LDL Cholesterol (Calc): 250 mg/dL — ABNORMAL HIGH
Non-HDL Cholesterol (Calc): 301 mg/dL — ABNORMAL HIGH
Total CHOL/HDL Ratio: 8.9 (calc) — ABNORMAL HIGH
Triglycerides: 300 mg/dL — ABNORMAL HIGH

## 2018-12-28 LAB — HEMOGLOBIN A1C
Hgb A1c MFr Bld: 7.9 % of total Hgb — ABNORMAL HIGH (ref <5.7)
Mean Plasma Glucose: 180 (calc)
eAG (mmol/L): 10 (calc)

## 2018-12-28 LAB — PSA, TOTAL WITH REFLEX TO PSA, FREE: PSA, Total: 0.6 ng/mL

## 2018-12-28 LAB — VITAMIN D 25 HYDROXY (VIT D DEFICIENCY, FRACTURES): Vit D, 25-Hydroxy: 8 ng/mL — ABNORMAL LOW (ref 30–100)

## 2018-12-28 MED ORDER — VITAMIN D (ERGOCALCIFEROL) 1.25 MG (50000 UNIT) PO CAPS: 50000.0000 [IU] | ORAL_CAPSULE | ORAL | 0 refills | Status: DC

## 2018-12-28 MED ORDER — TADALAFIL 5 MG PO TABS: 2.5000 mg | ORAL_TABLET | Freq: Every day | ORAL | 11 refills | Status: DC

## 2019-01-01 ENCOUNTER — Other Ambulatory Visit: Payer: Self-pay | Admitting: Osteopathic Medicine

## 2019-01-01 ENCOUNTER — Telehealth: Payer: Self-pay | Admitting: Osteopathic Medicine

## 2019-01-01 MED ORDER — ROSUVASTATIN CALCIUM 40 MG PO TABS: 40.0000 mg | ORAL_TABLET | Freq: Every day | ORAL | 3 refills | Status: DC

## 2019-01-01 NOTE — Progress Notes (Signed)
Left a detailed vm msg for pt regarding cholesterol medication sent to [facility] m/o & provider's note. Direct call back info provided.

## 2019-01-01 NOTE — Telephone Encounter (Signed)
Additional Information Required Available without authorization. Insurance will only allow 1 a day and does not give me an option to appeal for the Plavix. Please advise.

## 2019-01-01 NOTE — Telephone Encounter (Signed)
Medication: Cilostazol 100MG  Tablets  Key: [place]

## 2019-01-02 NOTE — Telephone Encounter (Signed)
His cardiologist had him on the cilostazol 100 mg bid, plavix 75 mg daily.  if there is an issue, insurance should contact  Cardiologist Horald Chestnut, [place]  [address]  [place], [place] [address]  [phone]  [phone] (Fax)

## 2019-01-02 NOTE — Telephone Encounter (Signed)
Left brief VM for patient to follow up with Cardiology. Patient was asked to call back with questions.

## 2019-01-23 DIAGNOSIS — H2513 Age-related nuclear cataract, bilateral: Secondary | ICD-10-CM | POA: Diagnosis not present

## 2019-01-23 DIAGNOSIS — H524 Presbyopia: Secondary | ICD-10-CM | POA: Diagnosis not present

## 2019-01-23 DIAGNOSIS — E113493 Type 2 diabetes mellitus with severe nonproliferative diabetic retinopathy without macular edema, bilateral: Secondary | ICD-10-CM | POA: Diagnosis not present

## 2019-01-23 LAB — HM DIABETES EYE EXAM

## 2019-01-25 ENCOUNTER — Encounter: Payer: Self-pay | Admitting: Osteopathic Medicine

## 2019-01-30 DIAGNOSIS — E1140 Type 2 diabetes mellitus with diabetic neuropathy, unspecified: Secondary | ICD-10-CM | POA: Diagnosis not present

## 2019-01-30 DIAGNOSIS — E113313 Type 2 diabetes mellitus with moderate nonproliferative diabetic retinopathy with macular edema, bilateral: Secondary | ICD-10-CM | POA: Diagnosis not present

## 2019-01-30 DIAGNOSIS — Z794 Long term (current) use of insulin: Secondary | ICD-10-CM | POA: Diagnosis not present

## 2019-03-11 DIAGNOSIS — I252 Old myocardial infarction: Secondary | ICD-10-CM | POA: Diagnosis not present

## 2019-03-11 DIAGNOSIS — I2510 Atherosclerotic heart disease of native coronary artery without angina pectoris: Secondary | ICD-10-CM | POA: Diagnosis not present

## 2019-03-11 DIAGNOSIS — Z89421 Acquired absence of other right toe(s): Secondary | ICD-10-CM | POA: Diagnosis not present

## 2019-03-11 DIAGNOSIS — I69351 Hemiplegia and hemiparesis following cerebral infarction affecting right dominant side: Secondary | ICD-10-CM | POA: Diagnosis not present

## 2019-03-11 DIAGNOSIS — E1151 Type 2 diabetes mellitus with diabetic peripheral angiopathy without gangrene: Secondary | ICD-10-CM | POA: Diagnosis not present

## 2019-03-11 DIAGNOSIS — E113399 Type 2 diabetes mellitus with moderate nonproliferative diabetic retinopathy without macular edema, unspecified eye: Secondary | ICD-10-CM | POA: Diagnosis not present

## 2019-03-11 DIAGNOSIS — Z7902 Long term (current) use of antithrombotics/antiplatelets: Secondary | ICD-10-CM | POA: Diagnosis not present

## 2019-03-11 DIAGNOSIS — G6289 Other specified polyneuropathies: Secondary | ICD-10-CM | POA: Diagnosis not present

## 2019-03-11 DIAGNOSIS — Z955 Presence of coronary angioplasty implant and graft: Secondary | ICD-10-CM | POA: Diagnosis not present

## 2019-03-11 DIAGNOSIS — F1120 Opioid dependence, uncomplicated: Secondary | ICD-10-CM | POA: Diagnosis not present

## 2019-03-11 DIAGNOSIS — Z7901 Long term (current) use of anticoagulants: Secondary | ICD-10-CM | POA: Diagnosis not present

## 2019-03-11 DIAGNOSIS — Z794 Long term (current) use of insulin: Secondary | ICD-10-CM | POA: Diagnosis not present

## 2019-03-21 ENCOUNTER — Telehealth: Payer: Self-pay | Admitting: Physician Assistant

## 2019-03-21 NOTE — Telephone Encounter (Signed)
Received a fax from [facility] that Plavix tablet is covered through 08/08/2019. Pharmacy aware.

## 2019-05-01 DIAGNOSIS — Z794 Long term (current) use of insulin: Secondary | ICD-10-CM | POA: Diagnosis not present

## 2019-05-01 DIAGNOSIS — E1140 Type 2 diabetes mellitus with diabetic neuropathy, unspecified: Secondary | ICD-10-CM | POA: Diagnosis not present

## 2019-05-01 DIAGNOSIS — E113313 Type 2 diabetes mellitus with moderate nonproliferative diabetic retinopathy with macular edema, bilateral: Secondary | ICD-10-CM | POA: Diagnosis not present

## 2019-06-07 ENCOUNTER — Telehealth: Payer: Self-pay

## 2019-06-07 MED ORDER — OXYCODONE HCL 10 MG PO TABS: 5.0000 mg | ORAL_TABLET | Freq: Four times a day (QID) | ORAL | 0 refills | Status: DC | PRN

## 2019-06-07 NOTE — Telephone Encounter (Signed)
Please call and have pt schedule follow up

## 2019-06-07 NOTE — Telephone Encounter (Signed)
Pt called requesting to schedule an appt with Dr. Sheppard Coil. Pls contact pt for scheduling. Thanks.

## 2019-06-07 NOTE — Telephone Encounter (Signed)
Refilled but he is overdue for visit, from last note:   Follow-up plan: Return in about 3 months (around 03/29/2019) for Recheck A1c, see me sooner if needed.   PDMP reviewed, no red flags, c/w sparing use of opiate

## 2019-06-07 NOTE — Telephone Encounter (Signed)
Pt called requesting a med refill for oxycodone. Pls send rx to [facility].

## 2019-06-07 NOTE — Telephone Encounter (Signed)
Left patient a voicemail with information below. Let patient know to call us back to schedule an appointment in the office. °

## 2019-06-10 ENCOUNTER — Encounter: Payer: Self-pay | Admitting: Family Medicine

## 2019-06-10 ENCOUNTER — Ambulatory Visit (INDEPENDENT_AMBULATORY_CARE_PROVIDER_SITE_OTHER): Payer: Medicare HMO | Admitting: Family Medicine

## 2019-06-10 ENCOUNTER — Other Ambulatory Visit: Payer: Self-pay

## 2019-06-10 VITALS — BP 126/63 | HR 90 | Ht 69.0 in | Wt 161.0 lb

## 2019-06-10 DIAGNOSIS — M79645 Pain in left finger(s): Secondary | ICD-10-CM

## 2019-06-10 DIAGNOSIS — L03012 Cellulitis of left finger: Secondary | ICD-10-CM

## 2019-06-10 MED ORDER — DOXYCYCLINE HYCLATE 100 MG PO TABS: 100.0000 mg | ORAL_TABLET | Freq: Two times a day (BID) | ORAL | 0 refills | Status: DC

## 2019-06-10 MED ORDER — DICLOFENAC SODIUM 1 % TD GEL: 2.0000 g | Freq: Four times a day (QID) | TRANSDERMAL | 99 refills | Status: DC

## 2019-06-10 NOTE — Progress Notes (Signed)
Acute Office Visit  Subjective:    Patient ID: Joseph Grimes, male    DOB: 07/02/1961, 58 y.o.   MRN: [id number]  Chief Complaint  Patient presents with  . infected nail    L thumb nail. he stated that he had a hangnail about 2 mos ago and tried to cut this out. the area is very sore and tender to touch. he hasn't tried soaking or using anything on the area    HPI Patient is in today for L thumb nail. he stated that he had a hangnail about 2 mos ago and tried to cut this out. the area is very sore and tender to touch.  He says about 2 weeks ago he got some thickened skin on it and he pulled that off and saw a little bit of yellow pus.  He hasn't tried soaking or using anything on the area.  Says the area is just getting more sore.  No fever chills or sweats. Past Medical History:  Diagnosis Date  . CKD (chronic kidney disease) stage 3, GFR 30-59 ml/min 10/11/2018  . Coronary artery disease 10/11/2018  . Diabetes ([place])   . Diabetes mellitus type 2, controlled, with complications ([place]) A999333  . Essential hypertension 10/11/2018  . Heart attack ([place])   . High cholesterol   . History of anemia 10/11/2018  . History of low potassium   . Low hemoglobin   . Mixed hyperlipidemia 10/11/2018  . Osteoarthritis 10/11/2018  . Peripheral vascular disease ([place]) 10/11/2018  . Stroke ([place])   . Tobacco dependence 10/11/2018    History reviewed. No pertinent surgical history.  Family History  Problem Relation Age of Onset  . Diabetes Sister   . Diabetes Brother     Social History   Socioeconomic History  . Marital status: Married    Spouse name: Not on file  . Number of children: Not on file  . Years of education: Not on file  . Highest education level: Not on file  Occupational History  . Not on file  Social Needs  . Financial resource strain: Not on file  . Food insecurity    Worry: Not on file    Inability: Not on file  . Transportation needs    Medical: Not on file    Non-medical:  Not on file  Tobacco Use  . Smoking status: Current Every Day Smoker    Packs/day: 0.50  . Smokeless tobacco: Never Used  Substance and Sexual Activity  . Alcohol use: Not Currently    Frequency: Never  . Drug use: Never  . Sexual activity: Not Currently    Partners: Female  Lifestyle  . Physical activity    Days per week: Not on file    Minutes per session: Not on file  . Stress: Not on file  Relationships  . Social Herbalist on phone: Not on file    Gets together: Not on file    Attends religious service: Not on file    Active member of club or organization: Not on file    Attends meetings of clubs or organizations: Not on file    Relationship status: Not on file  . Intimate partner violence    Fear of current or ex partner: Not on file    Emotionally abused: Not on file    Physically abused: Not on file    Forced sexual activity: Not on file  Other Topics Concern  . Not on file  Social History Narrative  . Not on file    Outpatient Medications Prior to Visit  Medication Sig Dispense Refill  . ACCU-CHEK AVIVA PLUS test strip     . Alcohol Swabs (B-D SINGLE USE SWABS REGULAR) PADS     . aspirin 81 MG tablet Take 1 tablet (81 mg total) by mouth daily. 90 tablet 3  . Aspirin Buf,CaCarb-MgCarb-MgO, 81 MG TABS Take by mouth.    . cilostazol (PLETAL) 100 MG tablet Take 1 tablet (100 mg total) by mouth 2 (two) times daily. 180 tablet 3  . clopidogrel (PLAVIX) 75 MG tablet Take 1 tablet (75 mg total) by mouth daily. 90 tablet 3  . cyanocobalamin ([facility] VITAMIN B12) 1000 MCG tablet Take by mouth.    . ferrous sulfate 325 (65 FE) MG tablet Take 1 tablet (325 mg total) by mouth 2 (two) times daily with a meal. 180 tablet 3  . furosemide (LASIX) 20 MG tablet Take 1 tablet (20 mg total) by mouth daily. 90 tablet 3  . insulin NPH-regular Human (70-30) 100 UNIT/ML injection Inject into the skin.    . Oxycodone HCl 10 MG TABS Take 0.5-1 tablets (5-10 mg total) by mouth every  6 (six) hours as needed. 20 tablet 0  . pantoprazole (PROTONIX) 40 MG tablet Take 1 tablet (40 mg total) by mouth daily. 90 tablet 3  . potassium chloride (K-DUR) 10 MEQ tablet Take 1 tablet (10 mEq total) by mouth daily. 90 tablet 3  . potassium chloride (K-DUR) 10 MEQ tablet     . rosuvastatin (CRESTOR) 40 MG tablet Take 1 tablet (40 mg total) by mouth daily. 90 tablet 3  . clotrimazole-betamethasone (LOTRISONE) cream     . collagenase (SANTYL) ointment APP EXT AA D    . folic acid (FOLVITE) 1 MG tablet Take by mouth.    Marland Kitchen lisinopril (ZESTRIL) 5 MG tablet Take 1 tablet (5 mg total) by mouth daily. 90 tablet 3  . tadalafil (CIALIS) 5 MG tablet Take 0.5-1 tablets (2.5-5 mg total) by mouth daily. 30 tablet 11  . Vitamin D, Ergocalciferol, (DRISDOL) 1.25 MG (50000 UT) CAPS capsule Take 1 capsule (50,000 Units total) by mouth every 7 (seven) days. Take for 12 total doses(weeks) than can transition to 1000 units OTC supplement daily 12 capsule 0   No facility-administered medications prior to visit.     No Known Allergies  ROS     Objective:    Physical Exam  Constitutional: He is oriented to person, place, and time. He appears well-developed and well-nourished.  HENT:  Head: Normocephalic and atraumatic.  Eyes: Conjunctivae and EOM are normal.  Cardiovascular: Normal rate.  Pulmonary/Chest: Effort normal.  Musculoskeletal:     Comments: Left thumb with normal flexion range of motion.  He does have a little bit of swelling and tenderness along the lateral border.  He is also just a little bit tender over the distal joint.  A little bit of dusky appearance at the base of the nail but no bright red erythema.  No active drainage.  No significant fluctuance.  The skin is mostly dry  Neurological: He is alert and oriented to person, place, and time.  Skin: Skin is dry. No pallor.  Psychiatric: He has a normal mood and affect. His behavior is normal.  Vitals reviewed.   BP 126/63   Pulse  90   Ht 5\' 9"  (1.753 m)   Wt 161 lb (73 kg)   SpO2 99%   BMI 23.78  kg/m  Wt Readings from Last 3 Encounters:  06/10/19 161 lb (73 kg)  12/27/18 175 lb 4.8 oz (79.5 kg)  10/11/18 172 lb 9.6 oz (78.3 kg)    There are no preventive care reminders to display for this patient.  There are no preventive care reminders to display for this patient.   No results found for: TSH Lab Results  Component Value Date   WBC 10.7 12/27/2018   HGB 14.4 12/27/2018   HCT 44.1 12/27/2018   MCV 90.9 12/27/2018   PLT 331 12/27/2018   Lab Results  Component Value Date   NA 138 12/27/2018   K 4.8 12/27/2018   CO2 27 12/27/2018   GLUCOSE 196 (H) 12/27/2018   BUN 17 12/27/2018   CREATININE 1.63 (H) 12/27/2018   BILITOT 0.3 12/27/2018   AST 15 12/27/2018   ALT 15 12/27/2018   PROT 6.0 (L) 12/27/2018   CALCIUM 9.0 12/27/2018   Lab Results  Component Value Date   CHOL 339 (H) 12/27/2018   Lab Results  Component Value Date   HDL 38 (L) 12/27/2018   Lab Results  Component Value Date   LDLCALC 250 (H) 12/27/2018   Lab Results  Component Value Date   TRIG 300 (H) 12/27/2018   Lab Results  Component Value Date   CHOLHDL 8.9 (H) 12/27/2018   Lab Results  Component Value Date   HGBA1C 7.9 (H) 12/27/2018       Assessment & Plan:   Problem List Items Addressed This Visit    None    Visit Diagnoses    Thumb pain, left    -  Primary   Relevant Medications   diclofenac sodium (VOLTAREN) 1 % GEL   doxycycline (VIBRA-TABS) 100 MG tablet   Paronychia of left thumb         Left thumb pain/paronychia-though I really do not think there is a lot of pus or drainage present so I did not recommend I&D.  We will treat with oral antibiotics as he starting to get some pain near the joint.  If not improving over the next 10 days then please return.  Also sent a prescription for Voltaren gel to use on the joint.   Meds ordered this encounter  Medications  . diclofenac sodium (VOLTAREN) 1 %  GEL    Sig: Apply 2 g topically 4 (four) times daily.    Dispense:  100 g    Refill:  PRN  . doxycycline (VIBRA-TABS) 100 MG tablet    Sig: Take 1 tablet (100 mg total) by mouth 2 (two) times daily.    Dispense:  20 tablet    Refill:  0     Beatrice Lecher, MD

## 2019-06-24 ENCOUNTER — Ambulatory Visit (INDEPENDENT_AMBULATORY_CARE_PROVIDER_SITE_OTHER): Payer: Medicare HMO

## 2019-06-24 ENCOUNTER — Ambulatory Visit (INDEPENDENT_AMBULATORY_CARE_PROVIDER_SITE_OTHER): Payer: Medicare HMO | Admitting: Family Medicine

## 2019-06-24 ENCOUNTER — Encounter: Payer: Self-pay | Admitting: Family Medicine

## 2019-06-24 ENCOUNTER — Other Ambulatory Visit: Payer: Self-pay

## 2019-06-24 VITALS — BP 124/60 | HR 65 | Ht 69.0 in | Wt 164.0 lb

## 2019-06-24 DIAGNOSIS — M79645 Pain in left finger(s): Secondary | ICD-10-CM | POA: Diagnosis not present

## 2019-06-24 DIAGNOSIS — R937 Abnormal findings on diagnostic imaging of other parts of musculoskeletal system: Secondary | ICD-10-CM

## 2019-06-24 DIAGNOSIS — G8929 Other chronic pain: Secondary | ICD-10-CM

## 2019-06-24 NOTE — Progress Notes (Signed)
Acute Office Visit  Subjective:    Patient ID: Joseph Grimes, male    DOB: 11/28/1960, 58 y.o.   MRN: [id number]  Chief Complaint  Patient presents with  . thumb pain    L thumb he finished the ABX and reports that the thumb has not begun to feel any better. he has not tried any epsom salt soaks    HPI Patient is in today for persistent left thumb pain.  I actually saw him about 2 weeks ago.  Prior to that about 2 months prior he had a hangnail and tried to cut it out.  The area became very sore and tender and then the skin became very thickened and he pulled it off and saw a little bit of yellow pus.  He had not tried soaking the area or using any other products on her medications.  Decided to treat him with oral doxycycline and Voltaren gel.  He says he completed all the antibiotics and says it really does not feel tremendously better to him.  Just feels sore to touch over the border of the nail particularly facing the first finger he has not had any more drainage or pus from the area.  Sometimes he will get a little discomfort in the DIP joint but not often.  He is right-handed.  Past Medical History:  Diagnosis Date  . CKD (chronic kidney disease) stage 3, GFR 30-59 ml/min 10/11/2018  . Coronary artery disease 10/11/2018  . Diabetes ([place])   . Diabetes mellitus type 2, controlled, with complications ([place]) A999333  . Essential hypertension 10/11/2018  . Heart attack ([place])   . High cholesterol   . History of anemia 10/11/2018  . History of low potassium   . Low hemoglobin   . Mixed hyperlipidemia 10/11/2018  . Osteoarthritis 10/11/2018  . Peripheral vascular disease ([place]) 10/11/2018  . Stroke ([place])   . Tobacco dependence 10/11/2018    No past surgical history on file.  Family History  Problem Relation Age of Onset  . Diabetes Sister   . Diabetes Brother     Social History   Socioeconomic History  . Marital status: Married    Spouse name: Not on file  . Number of children: Not  on file  . Years of education: Not on file  . Highest education level: Not on file  Occupational History  . Not on file  Social Needs  . Financial resource strain: Not on file  . Food insecurity    Worry: Not on file    Inability: Not on file  . Transportation needs    Medical: Not on file    Non-medical: Not on file  Tobacco Use  . Smoking status: Current Every Day Smoker    Packs/day: 0.50  . Smokeless tobacco: Never Used  Substance and Sexual Activity  . Alcohol use: Not Currently    Frequency: Never  . Drug use: Never  . Sexual activity: Not Currently    Partners: Female  Lifestyle  . Physical activity    Days per week: Not on file    Minutes per session: Not on file  . Stress: Not on file  Relationships  . Social Herbalist on phone: Not on file    Gets together: Not on file    Attends religious service: Not on file    Active member of club or organization: Not on file    Attends meetings of clubs or organizations: Not on file  Relationship status: Not on file  . Intimate partner violence    Fear of current or ex partner: Not on file    Emotionally abused: Not on file    Physically abused: Not on file    Forced sexual activity: Not on file  Other Topics Concern  . Not on file  Social History Narrative  . Not on file    Outpatient Medications Prior to Visit  Medication Sig Dispense Refill  . ACCU-CHEK AVIVA PLUS test strip     . Alcohol Swabs (B-D SINGLE USE SWABS REGULAR) PADS     . aspirin 81 MG tablet Take 1 tablet (81 mg total) by mouth daily. 90 tablet 3  . Aspirin Buf,CaCarb-MgCarb-MgO, 81 MG TABS Take by mouth.    . cilostazol (PLETAL) 100 MG tablet Take 1 tablet (100 mg total) by mouth 2 (two) times daily. 180 tablet 3  . clopidogrel (PLAVIX) 75 MG tablet Take 1 tablet (75 mg total) by mouth daily. 90 tablet 3  . cyanocobalamin ([facility]S VITAMIN B12) 1000 MCG tablet Take by mouth.    . diclofenac sodium (VOLTAREN) 1 % GEL Apply 2 g topically  4 (four) times daily. 100 g PRN  . ferrous sulfate 325 (65 FE) MG tablet Take 1 tablet (325 mg total) by mouth 2 (two) times daily with a meal. 180 tablet 3  . furosemide (LASIX) 20 MG tablet Take 1 tablet (20 mg total) by mouth daily. 90 tablet 3  . insulin NPH-regular Human (70-30) 100 UNIT/ML injection Inject into the skin.    . pantoprazole (PROTONIX) 40 MG tablet Take 1 tablet (40 mg total) by mouth daily. 90 tablet 3  . potassium chloride (K-DUR) 10 MEQ tablet Take 1 tablet (10 mEq total) by mouth daily. 90 tablet 3  . potassium chloride (K-DUR) 10 MEQ tablet     . rosuvastatin (CRESTOR) 40 MG tablet Take 1 tablet (40 mg total) by mouth daily. 90 tablet 3  . doxycycline (VIBRA-TABS) 100 MG tablet Take 1 tablet (100 mg total) by mouth 2 (two) times daily. 20 tablet 0  . Oxycodone HCl 10 MG TABS Take 0.5-1 tablets (5-10 mg total) by mouth every 6 (six) hours as needed. 20 tablet 0   No facility-administered medications prior to visit.     No Known Allergies  ROS     Objective:    Physical Exam  Constitutional: He is oriented to person, place, and time. He appears well-developed and well-nourished.  HENT:  Head: Normocephalic and atraumatic.  Eyes: Conjunctivae and EOM are normal.  Cardiovascular: Normal rate.  Pulmonary/Chest: Effort normal.  Neurological: He is alert and oriented to person, place, and time.  Skin: Skin is dry. No pallor.  Left thumb with normal range of motion.  No induration or swelling.  There is just a little bit of a dusky appearance of the DIP joint.  But no erythema or drainage.  Is mildly tender over the border facing the first finger  Psychiatric: He has a normal mood and affect. His behavior is normal.  Vitals reviewed.   BP 124/60   Pulse 65   Ht 5\' 9"  (1.753 m)   Wt 164 lb (74.4 kg)   SpO2 99%   BMI 24.22 kg/m  Wt Readings from Last 3 Encounters:  06/24/19 164 lb (74.4 kg)  06/10/19 161 lb (73 kg)  12/27/18 175 lb 4.8 oz (79.5 kg)     Health Maintenance Due  Topic Date Due  . URINE MICROALBUMIN  07/28/1969    There are no preventive care reminders to display for this patient.   No results found for: TSH Lab Results  Component Value Date   WBC 10.7 12/27/2018   HGB 14.4 12/27/2018   HCT 44.1 12/27/2018   MCV 90.9 12/27/2018   PLT 331 12/27/2018   Lab Results  Component Value Date   NA 138 12/27/2018   K 4.8 12/27/2018   CO2 27 12/27/2018   GLUCOSE 196 (H) 12/27/2018   BUN 17 12/27/2018   CREATININE 1.63 (H) 12/27/2018   BILITOT 0.3 12/27/2018   AST 15 12/27/2018   ALT 15 12/27/2018   PROT 6.0 (L) 12/27/2018   CALCIUM 9.0 12/27/2018   Lab Results  Component Value Date   CHOL 339 (H) 12/27/2018   Lab Results  Component Value Date   HDL 38 (L) 12/27/2018   Lab Results  Component Value Date   LDLCALC 250 (H) 12/27/2018   Lab Results  Component Value Date   TRIG 300 (H) 12/27/2018   Lab Results  Component Value Date   CHOLHDL 8.9 (H) 12/27/2018   Lab Results  Component Value Date   HGBA1C 7.9 (H) 12/27/2018       Assessment & Plan:   Problem List Items Addressed This Visit    None    Visit Diagnoses    Chronic pain of left thumb    -  Primary   Relevant Orders   DG Finger Thumb Left   Ambulatory referral to Hand Surgery     Left thumb pain-today just has a little bit more of a dusky appearance right around the joint space but no acute swelling no induration and no active drainage.  Normal range of motion of the DIP joint.  He does not member any trauma or injury that would have caused it he does not member any kind of foreign body or anything poking or stabbing the skin.  It just started with a hangnail.  Recommend x-ray today just to rule out any other bony lesions that could be causing his pain or any sign of foreign body.  We will plan to get him in with hand Ortho for further work-up as at this point I am not really sure what is causing his pain   No orders of the defined  types were placed in this encounter.    Beatrice Lecher, MD

## 2019-06-27 ENCOUNTER — Other Ambulatory Visit: Payer: Self-pay | Admitting: Family Medicine

## 2019-06-27 DIAGNOSIS — N1832 Chronic kidney disease, stage 3b: Secondary | ICD-10-CM

## 2019-06-27 DIAGNOSIS — I10 Essential (primary) hypertension: Secondary | ICD-10-CM

## 2019-06-27 NOTE — Progress Notes (Signed)
Labs needed for imaging.

## 2019-06-28 DIAGNOSIS — I10 Essential (primary) hypertension: Secondary | ICD-10-CM | POA: Diagnosis not present

## 2019-06-29 LAB — COMPLETE METABOLIC PANEL WITH GFR
AG Ratio: 1.1 (calc) (ref 1.0–2.5)
ALT: 11 U/L (ref 9–46)
AST: 14 U/L (ref 10–35)
Albumin: 3.3 g/dL — ABNORMAL LOW (ref 3.6–5.1)
Alkaline phosphatase (APISO): 155 U/L — ABNORMAL HIGH (ref 35–144)
BUN/Creatinine Ratio: 7 (calc) (ref 6–22)
BUN: 13 mg/dL (ref 7–25)
CO2: 23 mmol/L (ref 20–32)
Calcium: 9.4 mg/dL (ref 8.6–10.3)
Chloride: 106 mmol/L (ref 98–110)
Creat: 1.89 mg/dL — ABNORMAL HIGH (ref 0.70–1.33)
GFR, Est African American: 44 mL/min/{1.73_m2} — ABNORMAL LOW (ref >=60)
GFR, Est Non African American: 38 mL/min/{1.73_m2} — ABNORMAL LOW (ref >=60)
Globulin: 2.9 g/dL (calc) (ref 1.9–3.7)
Glucose, Bld: 137 mg/dL — ABNORMAL HIGH (ref 65–99)
Potassium: 3.6 mmol/L (ref 3.5–5.3)
Sodium: 138 mmol/L (ref 135–146)
Total Bilirubin: 0.3 mg/dL (ref 0.2–1.2)
Total Protein: 6.2 g/dL (ref 6.1–8.1)

## 2019-07-01 ENCOUNTER — Ambulatory Visit (INDEPENDENT_AMBULATORY_CARE_PROVIDER_SITE_OTHER): Payer: Medicare HMO

## 2019-07-01 ENCOUNTER — Other Ambulatory Visit: Payer: Self-pay

## 2019-07-01 ENCOUNTER — Telehealth: Payer: Self-pay

## 2019-07-01 DIAGNOSIS — R937 Abnormal findings on diagnostic imaging of other parts of musculoskeletal system: Secondary | ICD-10-CM | POA: Diagnosis not present

## 2019-07-01 DIAGNOSIS — G8929 Other chronic pain: Secondary | ICD-10-CM | POA: Diagnosis not present

## 2019-07-01 DIAGNOSIS — L03012 Cellulitis of left finger: Secondary | ICD-10-CM | POA: Diagnosis not present

## 2019-07-01 DIAGNOSIS — M899 Disorder of bone, unspecified: Secondary | ICD-10-CM

## 2019-07-01 DIAGNOSIS — M86142 Other acute osteomyelitis, left hand: Secondary | ICD-10-CM

## 2019-07-01 DIAGNOSIS — M79645 Pain in left finger(s): Secondary | ICD-10-CM | POA: Diagnosis not present

## 2019-07-01 MED ORDER — GADOBUTROL 1 MMOL/ML IV SOLN
7.5000 mL | Freq: Once | INTRAVENOUS | Status: AC | PRN
  Administered 2019-07-01: 7.5 mL via INTRAVENOUS

## 2019-07-01 NOTE — Addendum Note (Signed)
Addended by: Beatrice Lecher D on: 07/01/2019 04:18 PM   Modules accepted: Orders

## 2019-07-01 NOTE — Progress Notes (Signed)
US ordered

## 2019-07-01 NOTE — Telephone Encounter (Signed)
Referrals placed per request in result   "Notes recorded by Hali Marry, MD on 07/01/2019 at 4:11 PM EST  Call patient and let him know the MRI does show that he has osteomyelitis which is an infection of the bone. This definitely explains why he is continuing to have discomfort and pain. Work on try to get him in with the infectious disease doctor and orthopedics ASAP. We will try to get him in tomorrow if at all possible."

## 2019-07-02 ENCOUNTER — Ambulatory Visit: Payer: Medicare HMO | Admitting: Osteopathic Medicine

## 2019-07-08 ENCOUNTER — Telehealth: Payer: Self-pay | Admitting: Family Medicine

## 2019-07-08 MED ORDER — SULFAMETHOXAZOLE-TRIMETHOPRIM 800-160 MG PO TABS: 1.0000 | ORAL_TABLET | Freq: Two times a day (BID) | ORAL | 0 refills | Status: DC

## 2019-07-08 NOTE — Telephone Encounter (Signed)
Please call patient and let him know I think his appointments are coming up this week for his thumb I like to go ahead and put him back on an oral antibiotic at least while were waiting to get in with him.  Prescription sent over to his local pharmacy.

## 2019-07-08 NOTE — Telephone Encounter (Signed)
I called the patient and he is agreeable the antibiotic and he has picked this up. He did not have any other questions.

## 2019-07-09 ENCOUNTER — Other Ambulatory Visit: Payer: Self-pay

## 2019-07-09 ENCOUNTER — Ambulatory Visit (INDEPENDENT_AMBULATORY_CARE_PROVIDER_SITE_OTHER): Payer: Medicare HMO

## 2019-07-09 DIAGNOSIS — N189 Chronic kidney disease, unspecified: Secondary | ICD-10-CM | POA: Diagnosis not present

## 2019-07-09 DIAGNOSIS — N1832 Chronic kidney disease, stage 3b: Secondary | ICD-10-CM

## 2019-07-12 ENCOUNTER — Encounter: Payer: Self-pay | Admitting: Family Medicine

## 2019-07-12 ENCOUNTER — Ambulatory Visit (INDEPENDENT_AMBULATORY_CARE_PROVIDER_SITE_OTHER): Payer: Medicare HMO | Admitting: Orthopaedic Surgery

## 2019-07-12 ENCOUNTER — Other Ambulatory Visit: Payer: Self-pay

## 2019-07-12 ENCOUNTER — Encounter: Payer: Self-pay | Admitting: Orthopaedic Surgery

## 2019-07-12 ENCOUNTER — Encounter (HOSPITAL_BASED_OUTPATIENT_CLINIC_OR_DEPARTMENT_OTHER): Payer: Self-pay

## 2019-07-12 DIAGNOSIS — N183 Chronic kidney disease, stage 3 unspecified: Secondary | ICD-10-CM

## 2019-07-12 DIAGNOSIS — I739 Peripheral vascular disease, unspecified: Secondary | ICD-10-CM

## 2019-07-12 DIAGNOSIS — M86242 Subacute osteomyelitis, left hand: Secondary | ICD-10-CM

## 2019-07-12 DIAGNOSIS — F17200 Nicotine dependence, unspecified, uncomplicated: Secondary | ICD-10-CM

## 2019-07-12 DIAGNOSIS — M86642 Other chronic osteomyelitis, left hand: Secondary | ICD-10-CM | POA: Diagnosis not present

## 2019-07-12 DIAGNOSIS — E118 Type 2 diabetes mellitus with unspecified complications: Secondary | ICD-10-CM

## 2019-07-12 DIAGNOSIS — Z794 Long term (current) use of insulin: Secondary | ICD-10-CM

## 2019-07-12 MED ORDER — HYDROCODONE-ACETAMINOPHEN 5-325 MG PO TABS: 1.0000 | ORAL_TABLET | Freq: Every day | ORAL | 0 refills | Status: DC | PRN

## 2019-07-12 NOTE — Progress Notes (Signed)
Patient was on my schedule for left thumb distal phalanx osteomyelitis.  He has diabetes, peripheral vascular disease, tobacco abuse, chronic kidney disease.  He has been on 2 different antibiotics and is not making progress.  MRI scan confirmed osteomyelitis.  I have discussed his case with Dr. Erlinda Hong who will see him this morning instead.

## 2019-07-12 NOTE — Progress Notes (Signed)
Office Visit Note   Patient: Joseph Grimes           Date of Birth: 1961-01-23           MRN: [id number] Visit Date: 07/12/2019              Requested by: Emeterio Reeve, [address] [place],  [place] [id number] PCP: Emeterio Reeve, DO   Assessment & Plan: Visit Diagnoses:  1. Chronic osteomyelitis of left hand ([place])     Plan: Impression is chronic left thumb distal phalanx osteomyelitis.  MRI shows erosion of the volar aspect of the distal phalanx consistent with chronic osteomyelitis.  These findings were reviewed with the patient in detail and based on discussion on treatment with antibiotics versus partial amputation the patient has elected to proceed with partial amputation of the left thumb.  Risk benefits alternatives reviewed with the patient including surgical site infection, nonhealing.  He understands and wishes to proceed. Total face to face encounter time was greater than 45 minutes and over half of this time was spent in counseling and/or coordination of care.  Follow-Up Instructions: Return for 1 week postop visit.   Orders:  No orders of the defined types were placed in this encounter.  Meds ordered this encounter  Medications   HYDROcodone-acetaminophen (NORCO) 5-325 MG tablet    Sig: Take 1 tablet by mouth daily as needed.    Dispense:  10 tablet    Refill:  0      Procedures: No procedures performed   Clinical Data: No additional findings.   Subjective: Chief Complaint  Patient presents with   Left Hand - Pain    Joseph Grimes is a 58 year old male who is a referral from his primary care doctor for chronic left thumb pain.  He states that couple months ago he was clipping an ingrown nail and he pulled some skin off and since then it has been tender and swollen but without drainage.  Denies any constitutional symptoms.  He is on Plavix chronically.  Due to the continued pain he had an MRI of the left thumb which shows chronic  osteomyelitis of the distal phalanx left thumb.  He comes in today for management of this.   Review of Systems  Constitutional: Negative.   All other systems reviewed and are negative.    Objective: Vital Signs: Ht 5\' 9"  (1.753 m)    Wt 161 lb (73 kg)    BMI 23.78 kg/m   Physical Exam Vitals signs and nursing note reviewed.  Constitutional:      Appearance: He is well-developed.  HENT:     Head: Normocephalic and atraumatic.  Eyes:     Pupils: Pupils are equal, round, and reactive to light.  Neck:     Musculoskeletal: Neck supple.  Pulmonary:     Effort: Pulmonary effort is normal.  Abdominal:     Palpations: Abdomen is soft.  Musculoskeletal: Normal range of motion.  Skin:    General: Skin is warm.  Neurological:     Mental Status: He is alert and oriented to person, place, and time.  Psychiatric:        Behavior: Behavior normal.        Thought Content: Thought content normal.        Judgment: Judgment normal.     Ortho Exam Left thumb exam shows no swelling or drainage or cellulitis forming warmth.  It is quite tender to palpation throughout the distal portion.  Sensation is intact.  Capillary refill is normal.  Flexor extensor functions are intact. Specialty Comments:  No specialty comments available.  Imaging: No results found.   PMFS History: Patient Active Problem List   Diagnosis Date Noted   Chronic osteomyelitis of left hand ([place]) 07/12/2019   Vitamin D deficiency 12/28/2018   Erectile dysfunction 12/28/2018   PVC (premature ventricular contraction) 12/27/2018   Essential hypertension 10/11/2018   Coronary artery disease 10/11/2018   Peripheral vascular disease ([place]) 10/11/2018   Diabetes mellitus type 2, controlled, with complications ([place]) [id number]   Mixed hyperlipidemia 10/11/2018   Tobacco dependence 10/11/2018   Osteoarthritis 10/11/2018   CKD (chronic kidney disease) stage 3, GFR 30-59 ml/min 10/11/2018   History of anemia  10/11/2018   Other chronic pain 06/08/2018   Abnormal stress test 04/10/2018   Non-traumatic rhabdomyolysis 02/01/2018   Hypokalemia 02/01/2018   Hypophosphatemia 02/01/2018   Perianal abscess 10/16/2017   Perirectal abscess 10/12/2017   Drug-induced constipation 10/12/2017   GI bleed 09/15/2017   Elevated troponin 06/15/2017   Chronic diastolic heart failure ([place]) 06/15/2017   Acute on chronic blood loss anemia 06/15/2017   History of cerebrovascular accident 09/20/2016   Bone lesion 09/29/2014   Finger pain, right 09/29/2014   S/P coronary artery stent placement 12/20/2012   Old myocardial infarction 10/27/2010   Past Medical History:  Diagnosis Date   CKD (chronic kidney disease) stage 3, GFR 30-59 ml/min 10/11/2018   Coronary artery disease 10/11/2018   Diabetes ([place])    Diabetes mellitus type 2, controlled, with complications ([place]) [id number]   Essential hypertension 10/11/2018   Heart attack ([place])    High cholesterol    History of anemia 10/11/2018   History of low potassium    Low hemoglobin    Mixed hyperlipidemia 10/11/2018   Osteoarthritis 10/11/2018   Peripheral vascular disease ([place]) 10/11/2018   Stroke ([place])    Tobacco dependence 10/11/2018    Family History  Problem Relation Age of Onset   Diabetes Sister    Diabetes Brother     History reviewed. No pertinent surgical history. Social History   Occupational History   Not on file  Tobacco Use   Smoking status: Current Every Day Smoker    Packs/day: 0.50   Smokeless tobacco: Never Used  Substance and Sexual Activity   Alcohol use: Not Currently    Frequency: Never   Drug use: Never   Sexual activity: Not Currently    Partners: Female

## 2019-07-12 NOTE — Progress Notes (Signed)
Chart reviewed with Dr. Sabra Heck. Ok to proceed with surgery as scheduled. Cardiac clearance not needed at this time.

## 2019-07-15 ENCOUNTER — Ambulatory Visit: Payer: Medicare HMO | Admitting: Internal Medicine

## 2019-07-15 ENCOUNTER — Other Ambulatory Visit: Payer: Self-pay

## 2019-07-15 NOTE — Progress Notes (Signed)
Notified pt to come pick up Ensure pre surgery drink, pt verbalized understanding.

## 2019-07-16 ENCOUNTER — Other Ambulatory Visit (HOSPITAL_COMMUNITY)
Admission: RE | Admit: 2019-07-16 | Discharge: 2019-07-16 | Disposition: A | Discharge status: Home / Self Care | Payer: Medicare HMO | Source: Ambulatory Visit | Attending: Orthopaedic Surgery | Admitting: Orthopaedic Surgery

## 2019-07-16 ENCOUNTER — Other Ambulatory Visit: Payer: Self-pay | Admitting: Physician Assistant

## 2019-07-16 ENCOUNTER — Telehealth: Payer: Self-pay

## 2019-07-16 DIAGNOSIS — Z20828 Contact with and (suspected) exposure to other viral communicable diseases: Secondary | ICD-10-CM | POA: Insufficient documentation

## 2019-07-16 DIAGNOSIS — Z01812 Encounter for preprocedural laboratory examination: Secondary | ICD-10-CM | POA: Insufficient documentation

## 2019-07-16 NOTE — Progress Notes (Signed)

## 2019-07-16 NOTE — Telephone Encounter (Signed)
Patient needs more pain medication.  Surgery is Friday.  He uses [facility] in [place].

## 2019-07-16 NOTE — Telephone Encounter (Signed)
Please send in Rx into his pharm. Thanks.

## 2019-07-16 NOTE — Telephone Encounter (Signed)
Too early to refill.  Will give new rx after sx

## 2019-07-17 LAB — NOVEL CORONAVIRUS, NAA (HOSP ORDER, SEND-OUT TO REF LAB; TAT 18-24 HRS): SARS-CoV-2, NAA: NOT DETECTED

## 2019-07-17 NOTE — Telephone Encounter (Signed)
Called patient to advise  °

## 2019-07-19 ENCOUNTER — Ambulatory Visit (HOSPITAL_BASED_OUTPATIENT_CLINIC_OR_DEPARTMENT_OTHER)
Admission: RE | Admit: 2019-07-19 | Discharge: 2019-07-19 | Disposition: A | Discharge status: Home / Self Care | Payer: Medicare HMO | Attending: Orthopaedic Surgery | Admitting: Orthopaedic Surgery

## 2019-07-19 ENCOUNTER — Encounter (HOSPITAL_BASED_OUTPATIENT_CLINIC_OR_DEPARTMENT_OTHER): Payer: Self-pay | Admitting: Orthopaedic Surgery

## 2019-07-19 ENCOUNTER — Ambulatory Visit (HOSPITAL_BASED_OUTPATIENT_CLINIC_OR_DEPARTMENT_OTHER): Payer: Medicare HMO | Admitting: Certified Registered"

## 2019-07-19 ENCOUNTER — Encounter (HOSPITAL_BASED_OUTPATIENT_CLINIC_OR_DEPARTMENT_OTHER)
Admission: RE | Disposition: A | Discharge status: Home / Self Care | Payer: Self-pay | Source: Home / Self Care | Attending: Orthopaedic Surgery

## 2019-07-19 ENCOUNTER — Other Ambulatory Visit: Payer: Self-pay

## 2019-07-19 DIAGNOSIS — E782 Mixed hyperlipidemia: Secondary | ICD-10-CM | POA: Diagnosis not present

## 2019-07-19 DIAGNOSIS — I252 Old myocardial infarction: Secondary | ICD-10-CM | POA: Diagnosis not present

## 2019-07-19 DIAGNOSIS — M1990 Unspecified osteoarthritis, unspecified site: Secondary | ICD-10-CM | POA: Insufficient documentation

## 2019-07-19 DIAGNOSIS — E1151 Type 2 diabetes mellitus with diabetic peripheral angiopathy without gangrene: Secondary | ICD-10-CM | POA: Insufficient documentation

## 2019-07-19 DIAGNOSIS — E1169 Type 2 diabetes mellitus with other specified complication: Secondary | ICD-10-CM | POA: Diagnosis not present

## 2019-07-19 DIAGNOSIS — Z95820 Peripheral vascular angioplasty status with implants and grafts: Secondary | ICD-10-CM | POA: Diagnosis not present

## 2019-07-19 DIAGNOSIS — Z8673 Personal history of transient ischemic attack (TIA), and cerebral infarction without residual deficits: Secondary | ICD-10-CM | POA: Insufficient documentation

## 2019-07-19 DIAGNOSIS — Z7902 Long term (current) use of antithrombotics/antiplatelets: Secondary | ICD-10-CM | POA: Insufficient documentation

## 2019-07-19 DIAGNOSIS — Z79899 Other long term (current) drug therapy: Secondary | ICD-10-CM | POA: Insufficient documentation

## 2019-07-19 DIAGNOSIS — K219 Gastro-esophageal reflux disease without esophagitis: Secondary | ICD-10-CM | POA: Diagnosis not present

## 2019-07-19 DIAGNOSIS — Z7982 Long term (current) use of aspirin: Secondary | ICD-10-CM | POA: Diagnosis not present

## 2019-07-19 DIAGNOSIS — E1122 Type 2 diabetes mellitus with diabetic chronic kidney disease: Secondary | ICD-10-CM | POA: Diagnosis not present

## 2019-07-19 DIAGNOSIS — M86642 Other chronic osteomyelitis, left hand: Secondary | ICD-10-CM | POA: Insufficient documentation

## 2019-07-19 DIAGNOSIS — I2510 Atherosclerotic heart disease of native coronary artery without angina pectoris: Secondary | ICD-10-CM | POA: Insufficient documentation

## 2019-07-19 DIAGNOSIS — N183 Chronic kidney disease, stage 3 unspecified: Secondary | ICD-10-CM | POA: Diagnosis not present

## 2019-07-19 DIAGNOSIS — M869 Osteomyelitis, unspecified: Secondary | ICD-10-CM | POA: Diagnosis not present

## 2019-07-19 DIAGNOSIS — I129 Hypertensive chronic kidney disease with stage 1 through stage 4 chronic kidney disease, or unspecified chronic kidney disease: Secondary | ICD-10-CM | POA: Insufficient documentation

## 2019-07-19 DIAGNOSIS — Z794 Long term (current) use of insulin: Secondary | ICD-10-CM | POA: Diagnosis not present

## 2019-07-19 DIAGNOSIS — F17210 Nicotine dependence, cigarettes, uncomplicated: Secondary | ICD-10-CM | POA: Insufficient documentation

## 2019-07-19 DIAGNOSIS — L905 Scar conditions and fibrosis of skin: Secondary | ICD-10-CM | POA: Diagnosis not present

## 2019-07-19 DIAGNOSIS — M868X4 Other osteomyelitis, hand: Secondary | ICD-10-CM | POA: Diagnosis not present

## 2019-07-19 HISTORY — PX: AMPUTATION: SHX166

## 2019-07-19 HISTORY — DX: Gastro-esophageal reflux disease without esophagitis: K21.9

## 2019-07-19 LAB — GLUCOSE, CAPILLARY
Glucose-Capillary: 74 mg/dL (ref 70–99)
Glucose-Capillary: 73 mg/dL (ref 70–99)
Glucose-Capillary: 93 mg/dL (ref 70–99)

## 2019-07-19 SURGERY — AMPUTATION DIGIT
Anesthesia: Monitor Anesthesia Care | Site: Thumb | Laterality: Left

## 2019-07-19 MED ORDER — LACTATED RINGERS IV SOLN
INTRAVENOUS | Status: DC
  Administered 2019-07-19: 12:00:00 via INTRAVENOUS

## 2019-07-19 MED ORDER — CEFAZOLIN SODIUM-DEXTROSE 2-4 GM/100ML-% IV SOLN
2.0000 g | INTRAVENOUS | Status: AC
  Administered 2019-07-19: 2 g via INTRAVENOUS

## 2019-07-19 MED ORDER — PROPOFOL 10 MG/ML IV BOLUS
INTRAVENOUS | Status: AC
  Filled 2019-07-19: qty 20

## 2019-07-19 MED ORDER — LIDOCAINE HCL (CARDIAC) PF 100 MG/5ML IV SOSY
PREFILLED_SYRINGE | INTRAVENOUS | Status: DC | PRN
  Administered 2019-07-19: 60 mg via INTRAVENOUS

## 2019-07-19 MED ORDER — ONDANSETRON HCL 4 MG/2ML IJ SOLN: 4.0000 mg | Freq: Once | INTRAMUSCULAR | Status: DC | PRN

## 2019-07-19 MED ORDER — HYDROCODONE-ACETAMINOPHEN 5-325 MG PO TABS: 1.0000 | ORAL_TABLET | Freq: Three times a day (TID) | ORAL | 0 refills | Status: DC | PRN

## 2019-07-19 MED ORDER — HYDROMORPHONE HCL 1 MG/ML IJ SOLN: 0.2500 mg | INTRAMUSCULAR | Status: DC | PRN

## 2019-07-19 MED ORDER — MUPIROCIN 2 % EX OINT
TOPICAL_OINTMENT | CUTANEOUS | Status: DC | PRN
  Administered 2019-07-19: 1 via TOPICAL

## 2019-07-19 MED ORDER — PROPOFOL 500 MG/50ML IV EMUL
INTRAVENOUS | Status: DC | PRN
  Administered 2019-07-19: 50 ug/kg/min via INTRAVENOUS

## 2019-07-19 MED ORDER — CHLORHEXIDINE GLUCONATE 4 % EX LIQD: 60.0000 mL | Freq: Once | CUTANEOUS | Status: DC

## 2019-07-19 MED ORDER — CEFAZOLIN SODIUM-DEXTROSE 2-4 GM/100ML-% IV SOLN
INTRAVENOUS | Status: AC
  Filled 2019-07-19: qty 100

## 2019-07-19 MED ORDER — MIDAZOLAM HCL 2 MG/2ML IJ SOLN
INTRAMUSCULAR | Status: AC
  Filled 2019-07-19: qty 2

## 2019-07-19 MED ORDER — DEXAMETHASONE SODIUM PHOSPHATE 10 MG/ML IJ SOLN
INTRAMUSCULAR | Status: DC | PRN
  Administered 2019-07-19: 5 mg via INTRAVENOUS

## 2019-07-19 MED ORDER — PHENYLEPHRINE 40 MCG/ML (10ML) SYRINGE FOR IV PUSH (FOR BLOOD PRESSURE SUPPORT)
PREFILLED_SYRINGE | INTRAVENOUS | Status: DC | PRN
  Administered 2019-07-19 (×4): 80 ug via INTRAVENOUS

## 2019-07-19 MED ORDER — LIDOCAINE HCL (PF) 1 % IJ SOLN
INTRAMUSCULAR | Status: AC
  Filled 2019-07-19: qty 30

## 2019-07-19 MED ORDER — LACTATED RINGERS IV SOLN: INTRAVENOUS | Status: DC

## 2019-07-19 MED ORDER — GLYCOPYRROLATE PF 0.2 MG/ML IJ SOSY
PREFILLED_SYRINGE | INTRAMUSCULAR | Status: AC
  Filled 2019-07-19: qty 2

## 2019-07-19 MED ORDER — BUPIVACAINE HCL (PF) 0.5 % IJ SOLN
INTRAMUSCULAR | Status: AC
  Filled 2019-07-19: qty 30

## 2019-07-19 MED ORDER — MEPERIDINE HCL 25 MG/ML IJ SOLN: 6.2500 mg | INTRAMUSCULAR | Status: DC | PRN

## 2019-07-19 MED ORDER — FENTANYL CITRATE (PF) 100 MCG/2ML IJ SOLN
INTRAMUSCULAR | Status: AC
  Filled 2019-07-19: qty 2

## 2019-07-19 MED ORDER — MIDAZOLAM HCL 5 MG/5ML IJ SOLN
INTRAMUSCULAR | Status: DC | PRN
  Administered 2019-07-19: 2 mg via INTRAVENOUS

## 2019-07-19 MED ORDER — ONDANSETRON HCL 4 MG/2ML IJ SOLN
INTRAMUSCULAR | Status: DC | PRN
  Administered 2019-07-19: 4 mg via INTRAVENOUS

## 2019-07-19 MED ORDER — PHENYLEPHRINE 40 MCG/ML (10ML) SYRINGE FOR IV PUSH (FOR BLOOD PRESSURE SUPPORT)
PREFILLED_SYRINGE | INTRAVENOUS | Status: AC
  Filled 2019-07-19: qty 10

## 2019-07-19 MED ORDER — ONDANSETRON HCL 4 MG PO TABS: 4.0000 mg | ORAL_TABLET | Freq: Three times a day (TID) | ORAL | 0 refills | Status: DC | PRN

## 2019-07-19 MED ORDER — ONDANSETRON HCL 4 MG/2ML IJ SOLN
INTRAMUSCULAR | Status: AC
  Filled 2019-07-19: qty 2

## 2019-07-19 MED ORDER — LIDOCAINE 2% (20 MG/ML) 5 ML SYRINGE
INTRAMUSCULAR | Status: AC
  Filled 2019-07-19: qty 10

## 2019-07-19 MED ORDER — MUPIROCIN 2 % EX OINT
TOPICAL_OINTMENT | CUTANEOUS | Status: AC
  Filled 2019-07-19: qty 22

## 2019-07-19 MED ORDER — FENTANYL CITRATE (PF) 100 MCG/2ML IJ SOLN
INTRAMUSCULAR | Status: DC | PRN
  Administered 2019-07-19 (×2): 25 ug via INTRAVENOUS

## 2019-07-19 MED ORDER — LIDOCAINE HCL 1 % IJ SOLN
INTRAMUSCULAR | Status: DC | PRN
  Administered 2019-07-19: 19 mL

## 2019-07-19 SURGICAL SUPPLY — 54 items
BAND RUBBER #18 3X1/16 STRL (MISCELLANEOUS) IMPLANT
BLADE SURG 15 STRL LF DISP TIS (BLADE) ×2 IMPLANT
BLADE SURG 15 STRL SS (BLADE) ×2
BNDG COHESIVE 1X5 TAN STRL LF (GAUZE/BANDAGES/DRESSINGS) ×2 IMPLANT
BNDG CONFORM 2 STRL LF (GAUZE/BANDAGES/DRESSINGS) ×2 IMPLANT
BNDG ELASTIC 3X5.8 VLCR STR LF (GAUZE/BANDAGES/DRESSINGS) IMPLANT
BNDG ESMARK 4X9 LF (GAUZE/BANDAGES/DRESSINGS) ×2 IMPLANT
BRUSH SCRUB EZ PLAIN DRY (MISCELLANEOUS) ×2 IMPLANT
CORD BIPOLAR FORCEPS 12FT (ELECTRODE) ×2 IMPLANT
COVER BACK TABLE REUSABLE LG (DRAPES) ×2 IMPLANT
COVER MAYO STAND REUSABLE (DRAPES) ×2 IMPLANT
COVER WAND RF STERILE (DRAPES) IMPLANT
CUFF TOURN SGL QUICK 18X4 (TOURNIQUET CUFF) ×2 IMPLANT
DECANTER SPIKE VIAL GLASS SM (MISCELLANEOUS) IMPLANT
DERMABOND ADVANCED (GAUZE/BANDAGES/DRESSINGS)
DERMABOND ADVANCED .7 DNX12 (GAUZE/BANDAGES/DRESSINGS) IMPLANT
DRAPE EXTREMITY T 121X128X90 (DISPOSABLE) ×2 IMPLANT
DRAPE IMP U-DRAPE 54X76 (DRAPES) ×2 IMPLANT
DRAPE SURG 17X23 STRL (DRAPES) ×2 IMPLANT
DRSG EMULSION OIL 3X3 NADH (GAUZE/BANDAGES/DRESSINGS) ×2 IMPLANT
DRSG TEGADERM 4X4.75 (GAUZE/BANDAGES/DRESSINGS) ×2 IMPLANT
GAUZE 4X4 16PLY RFD (DISPOSABLE) IMPLANT
GAUZE SPONGE 4X4 12PLY STRL (GAUZE/BANDAGES/DRESSINGS) ×2 IMPLANT
GAUZE XEROFORM 1X8 LF (GAUZE/BANDAGES/DRESSINGS) ×2 IMPLANT
GLOVE BIOGEL PI IND STRL 7.0 (GLOVE) ×2 IMPLANT
GLOVE BIOGEL PI INDICATOR 7.0 (GLOVE) ×2
GLOVE ECLIPSE 6.5 STRL STRAW (GLOVE) ×2 IMPLANT
GLOVE ECLIPSE 7.0 STRL STRAW (GLOVE) ×2 IMPLANT
GLOVE EXAM NITRILE MD LF STRL (GLOVE) ×2 IMPLANT
GLOVE SKINSENSE NS SZ7.5 (GLOVE) ×1
GLOVE SKINSENSE STRL SZ7.5 (GLOVE) ×1 IMPLANT
GLOVE SURG SYN 7.5  E (GLOVE) ×1
GLOVE SURG SYN 7.5 E (GLOVE) ×1 IMPLANT
GOWN STRL REIN XL XLG (GOWN DISPOSABLE) ×2 IMPLANT
GOWN STRL REUS W/ TWL LRG LVL3 (GOWN DISPOSABLE) ×1 IMPLANT
GOWN STRL REUS W/ TWL XL LVL3 (GOWN DISPOSABLE) ×1 IMPLANT
GOWN STRL REUS W/TWL LRG LVL3 (GOWN DISPOSABLE) ×1
GOWN STRL REUS W/TWL XL LVL3 (GOWN DISPOSABLE) ×1
NEEDLE HYPO 25X1 1.5 SAFETY (NEEDLE) ×2 IMPLANT
NS IRRIG 1000ML POUR BTL (IV SOLUTION) ×2 IMPLANT
PACK BASIN DAY SURGERY FS (CUSTOM PROCEDURE TRAY) ×2 IMPLANT
STOCKINETTE 4X48 STRL (DRAPES) IMPLANT
SUT ETHILON 2 0 FS 18 (SUTURE) IMPLANT
SUT ETHILON 4 0 PS 2 18 (SUTURE) ×4 IMPLANT
SUT VIC AB 0 CT1 27 (SUTURE)
SUT VIC AB 0 CT1 27XBRD ANBCTR (SUTURE) IMPLANT
SUT VIC AB 2-0 CT1 27 (SUTURE)
SUT VIC AB 2-0 CT1 TAPERPNT 27 (SUTURE) IMPLANT
SWAB COLLECTION DEVICE MRSA (MISCELLANEOUS) IMPLANT
SWAB CULTURE ESWAB REG 1ML (MISCELLANEOUS) IMPLANT
SYR BULB 3OZ (MISCELLANEOUS) ×2 IMPLANT
SYR CONTROL 10ML LL (SYRINGE) ×2 IMPLANT
TOWEL GREEN STERILE FF (TOWEL DISPOSABLE) ×2 IMPLANT
TRAY DSU PREP LF (CUSTOM PROCEDURE TRAY) ×2 IMPLANT

## 2019-07-19 NOTE — Anesthesia Postprocedure Evaluation (Signed)
Anesthesia Post Note  Patient: Joseph Grimes  Procedure(s) Performed: LEFT PARTIAL THUMB AMPUTATION (Left Thumb)     Patient location during evaluation: PACU Anesthesia Type: MAC Level of consciousness: awake and alert Pain management: pain level controlled Vital Signs Assessment: post-procedure vital signs reviewed and stable Respiratory status: spontaneous breathing, nonlabored ventilation, respiratory function stable and patient connected to nasal cannula oxygen Cardiovascular status: stable and blood pressure returned to baseline Postop Assessment: no apparent nausea or vomiting Anesthetic complications: no    Last Vitals:  Vitals:   07/19/19 1443 07/19/19 1512  BP:  125/78  Pulse: 73 83  Resp: 14 16  Temp:  36.6 C  SpO2: 98% 97%    Last Pain:  Vitals:   07/19/19 1512  TempSrc:   PainSc: 0-No pain                 Kadesia Robel DAVID

## 2019-07-19 NOTE — Discharge Instructions (Signed)
 Postoperative instructions:  Dressing instructions: Keep your dressing and/or splint clean and dry at all times.  It will be removed at your first post-operative appointment.  Your stitches and/or staples will be removed at this visit.  Incision instructions:  Do not soak your incision for 3 weeks after surgery.  If the incision gets wet, pat dry and do not scrub the incision.  Pain control:  You have been given a prescription to be taken as directed for post-operative pain control.  In addition, elevate the operative extremity above the heart at all times to prevent swelling and throbbing pain.  Take over-the-counter Colace, 100mg by mouth twice a day while taking narcotic pain medications to help prevent constipation.  Follow up appointments: 1) 7 days for wound check. 2) Joseph Grimes as scheduled.   -------------------------------------------------------------------------------------------------------------  After Surgery Pain Control:  After your surgery, post-surgical discomfort or pain is likely. This discomfort can last several days to a few weeks. At certain times of the day your discomfort may be more intense.  Did you receive a nerve block?  A nerve block can provide pain relief for one hour to two days after your surgery. As long as the nerve block is working, you will experience little or no sensation in the area the surgeon operated on.  As the nerve block wears off, you will begin to experience pain or discomfort. It is very important that you begin taking your prescribed pain medication before the nerve block fully wears off. Treating your pain at the first sign of the block wearing off will ensure your pain is better controlled and more tolerable when full-sensation returns. Do not wait until the pain is intolerable, as the medicine will be less effective. It is better to treat pain in advance than to try and catch up.  General Anesthesia:  If you did not receive a nerve block  during your surgery, you will need to start taking your pain medication shortly after your surgery and should continue to do so as prescribed by your surgeon.  Pain Medication:  Most commonly we prescribe Vicodin and Percocet for post-operative pain. Both of these medications contain a combination of acetaminophen (Tylenol) and a narcotic to help control pain.   It takes between 30 and 45 minutes before pain medication starts to work. It is important to take your medication before your pain level gets too intense.   Nausea is a common side effect of many pain medications. You will want to eat something before taking your pain medicine to help prevent nausea.   If you are taking a prescription pain medication that contains acetaminophen, we recommend that you do not take additional over the counter acetaminophen (Tylenol).  Other pain relieving options:   Using a cold pack to ice the affected area a few times a day (15 to 20 minutes at a time) can help to relieve pain, reduce swelling and bruising.   Elevation of the affected area can also help to reduce pain and swelling.     Post Anesthesia Home Care Instructions  Activity: Get plenty of rest for the remainder of the day. A responsible individual must stay with you for 24 hours following the procedure.  For the next 24 hours, DO NOT: -Drive a car -Operate machinery -Drink alcoholic beverages -Take any medication unless instructed by your physician -Make any legal decisions or sign important papers.  Meals: Start with liquid foods such as gelatin or soup. Progress to regular foods as tolerated.   Avoid greasy, spicy, heavy foods. If nausea and/or vomiting occur, drink only clear liquids until the nausea and/or vomiting subsides. Call your physician if vomiting continues.  Special Instructions/Symptoms: Your throat may feel dry or sore from the anesthesia or the breathing tube placed in your throat during surgery. If this causes  discomfort, gargle with warm salt water. The discomfort should disappear within 24 hours.  If you had a scopolamine patch placed behind your ear for the management of post- operative nausea and/or vomiting:  1. The medication in the patch is effective for 72 hours, after which it should be removed.  Wrap patch in a tissue and discard in the trash. Wash hands thoroughly with soap and water. 2. You may remove the patch earlier than 72 hours if you experience unpleasant side effects which may include dry mouth, dizziness or visual disturbances. 3. Avoid touching the patch. Wash your hands with soap and water after contact with the patch.     

## 2019-07-19 NOTE — Anesthesia Preprocedure Evaluation (Signed)
Anesthesia Evaluation  Patient identified by MRN, date of birth, ID band Patient awake    Reviewed: Allergy & Precautions, NPO status , Patient's Chart, lab work & pertinent test results  Airway Mallampati: I  TM Distance: >3 FB Neck ROM: Full    Dental   Pulmonary Current Smoker,    Pulmonary exam normal        Cardiovascular hypertension, + CAD, + Past MI and + Cardiac Stents  Normal cardiovascular exam     Neuro/Psych CVA    GI/Hepatic GERD  Controlled,  Endo/Other  diabetes, Type 2, Insulin Dependent  Renal/GU Renal InsufficiencyRenal disease     Musculoskeletal   Abdominal   Peds  Hematology   Anesthesia Other Findings   Reproductive/Obstetrics                             Anesthesia Physical Anesthesia Plan  ASA: III  Anesthesia Plan: MAC   Post-op Pain Management:    Induction: Intravenous  PONV Risk Score and Plan: Ondansetron and Treatment may vary due to age or medical condition  Airway Management Planned: Nasal Cannula  Additional Equipment:   Intra-op Plan:   Post-operative Plan:   Informed Consent: I have reviewed the patients History and Physical, chart, labs and discussed the procedure including the risks, benefits and alternatives for the proposed anesthesia with the patient or authorized representative who has indicated his/her understanding and acceptance.       Plan Discussed with: CRNA and Surgeon  Anesthesia Plan Comments:         Anesthesia Quick Evaluation

## 2019-07-19 NOTE — H&P (Addendum)
PREOPERATIVE H&P  Chief Complaint: left thumb osteomyelitis  HPI: Joseph Grimes is a 58 y.o. male who presents for surgical treatment of left thumb osteomyelitis.  He denies any changes in medical history.  Past Medical History:  Diagnosis Date  . CKD (chronic kidney disease) stage 3, GFR 30-59 ml/min 10/11/2018  . Coronary artery disease 10/11/2018  . Diabetes ([place])   . Diabetes mellitus type 2, controlled, with complications ([place]) [id number]  . Essential hypertension 10/11/2018  . GERD (gastroesophageal reflux disease)   . Heart attack [facility]) 2006   stent placed in right coronary artery  . High cholesterol   . History of anemia 10/11/2018  . History of low potassium   . Low hemoglobin   . Mixed hyperlipidemia 10/11/2018  . Osteoarthritis 10/11/2018  . Peripheral vascular disease ([place]) 10/11/2018  . Stroke ([place])   . Tobacco dependence 10/11/2018   Past Surgical History:  Procedure Laterality Date  . CARDIAC CATHETERIZATION  2006   04/18/18: no stents, stent in '06  . FEMORAL-POPLITEAL BYPASS GRAFT Right 05/12/2017  . HAND SURGERY    . KNEE ARTHROSCOPY     bilat  . TOE AMPUTATION     x2  . TONSILLECTOMY     Social History   Socioeconomic History  . Marital status: Married    Spouse name: Not on file  . Number of children: Not on file  . Years of education: Not on file  . Highest education level: Not on file  Occupational History  . Not on file  Tobacco Use  . Smoking status: Current Every Day Smoker    Packs/day: 0.50  . Smokeless tobacco: Never Used  Substance and Sexual Activity  . Alcohol use: Not Currently  . Drug use: Never  . Sexual activity: Not Currently    Partners: Female  Other Topics Concern  . Not on file  Social History Narrative  . Not on file   Social Determinants of Health   Financial Resource Strain:   . Difficulty of Paying Living Expenses: Not on file  Food Insecurity:   . Worried About Charity fundraiser in the Last Year: Not on file  .  Ran Out of Food in the Last Year: Not on file  Transportation Needs:   . Lack of Transportation (Medical): Not on file  . Lack of Transportation (Non-Medical): Not on file  Physical Activity:   . Days of Exercise per Week: Not on file  . Minutes of Exercise per Session: Not on file  Stress:   . Feeling of Stress : Not on file  Social Connections:   . Frequency of Communication with Friends and Family: Not on file  . Frequency of Social Gatherings with Friends and Family: Not on file  . Attends Religious Services: Not on file  . Active Member of Clubs or Organizations: Not on file  . Attends Archivist Meetings: Not on file  . Marital Status: Not on file   Family History  Problem Relation Age of Onset  . Diabetes Sister   . Diabetes Brother    No Known Allergies Prior to Admission medications   Medication Sig Start Date End Date Taking? Authorizing Provider  aspirin 81 MG tablet Take 1 tablet (81 mg total) by mouth daily. 10/11/18  Yes Emeterio Reeve, DO  cilostazol (PLETAL) 100 MG tablet Take 1 tablet (100 mg total) by mouth 2 (two) times daily. 10/11/18  Yes Emeterio Reeve, DO  clopidogrel (PLAVIX) 75 MG tablet  Take 1 tablet (75 mg total) by mouth daily. 10/11/18  Yes Emeterio Reeve, DO  cyanocobalamin ([employer] VITAMIN B12) 1000 MCG tablet Take by mouth.   Yes [provider]  ferrous sulfate 325 (65 FE) MG tablet Take 1 tablet (325 mg total) by mouth 2 (two) times daily with a meal. 10/11/18  Yes Emeterio Reeve, DO  furosemide (LASIX) 20 MG tablet Take 1 tablet (20 mg total) by mouth daily. 10/11/18  Yes Emeterio Reeve, DO  insulin NPH-regular Human (70-30) 100 UNIT/ML injection Inject 30 Units into the skin 2 (two) times daily.  06/09/17  Yes [provider]  pantoprazole (PROTONIX) 40 MG tablet Take 1 tablet (40 mg total) by mouth daily. 10/11/18  Yes Emeterio Reeve, DO  potassium chloride (K-DUR) 10 MEQ tablet Take 1 tablet (10 mEq total) by  mouth daily. 10/11/18 06/25/20 Yes Emeterio Reeve, DO  rosuvastatin (CRESTOR) 40 MG tablet Take 1 tablet (40 mg total) by mouth daily. 01/01/19  Yes Emeterio Reeve, DO  sulfamethoxazole-trimethoprim (BACTRIM DS) 800-160 MG tablet Take 1 tablet by mouth 2 (two) times daily. 07/08/19  Yes Hali Marry, MD  ACCU-CHEK AVIVA PLUS test strip  12/24/18   [provider]  Alcohol Swabs (B-D SINGLE USE SWABS REGULAR) PADS  03/27/19 09/08/19  [provider]     Positive ROS: All other systems have been reviewed and were otherwise negative with the exception of those mentioned in the HPI and as above.  Physical Exam: General: Alert, no acute distress Cardiovascular: No pedal edema Respiratory: No cyanosis, no use of accessory musculature GI: abdomen soft Skin: No lesions in the area of chief complaint Neurologic: Sensation intact distally Psychiatric: Patient is competent for consent with normal mood and affect Lymphatic: no lymphedema  MUSCULOSKELETAL: exam stable  Assessment: left thumb osteomyelitis  Plan: Plan for Procedure(s): LEFT PARTIAL THUMB AMPUTATION  The risks benefits and alternatives were discussed with the patient including but not limited to the risks of nonoperative treatment, versus surgical intervention including infection, bleeding, nerve injury,  blood clots, cardiopulmonary complications, morbidity, mortality, among others, and they were willing to proceed.   Eduard Roux, MD   07/19/2019 10:44 AM

## 2019-07-19 NOTE — Op Note (Signed)
   Date of Surgery: 07/19/2019  INDICATIONS: Mr. Shiller is a 58 y.o.-year-old male with a left thumb distal phalanx chronic osteomyelitis;  The patient did consent to the procedure after discussion of the risks and benefits.  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  PROCEDURE:  1. Amputation of left thumb through IP joint 2. Adjacent tissue rearrangement left thumb 2 cm  SURGEON: N. Eduard Roux, M.D.  ASSIST: Ciro Backer Goodman, [place]; necessary for the timely completion of procedure and due to complexity of procedure.  ANESTHESIA:  Digital block and MAC  IV FLUIDS AND URINE: See anesthesia.  ESTIMATED BLOOD LOSS: minimal mL.  IMPLANTS: none  DRAINS: none  COMPLICATIONS: see description of procedure.  DESCRIPTION OF PROCEDURE: The patient was brought to the operating room and placed supine on the operating table.  The patient had been signed prior to the procedure and this was documented. The patient had the anesthesia placed by the anesthesiologist.  A time-out was performed to confirm that this was the correct patient, site, side and location. The patient did receive antibiotics prior to the incision and was re-dosed during the procedure as needed at indicated intervals. The patient had the operative extremity prepped and draped in the standard surgical fashion.    A fishmouth incision was made based over the IP joint of the left thumb.  Full-thickness flaps were raised off of the proximal phalanx and the IP joint.  The IP joint was disarticulated with a 15 blade.  The neurovascular bundles were identified and cauterized and the nerves were transected.  This was then thoroughly irrigated.  Hemostasis was obtained with bipolar cautery.  Adjacent tissue rearrangement was performed of approximately 2 cm in order to approximate the skin edges with 4-0 nylon sutures.  Sterile dressings were applied.  Patient tolerated  procedure well had no me complications.  POSTOPERATIVE PLAN: Discharge home and follow-up in 1 week for wound check.  Azucena Cecil, MD 2:21 PM

## 2019-07-19 NOTE — Transfer of Care (Signed)
Immediate Anesthesia Transfer of Care Note  Patient: JASHAN COTTEN  Procedure(s) Performed: LEFT PARTIAL THUMB AMPUTATION (Left Thumb)  Patient Location: PACU  Anesthesia Type:MAC  Level of Consciousness: awake, alert  and oriented  Airway & Oxygen Therapy: Patient Spontanous Breathing and Patient connected to face mask oxygen  Post-op Assessment: Report given to RN and Post -op Vital signs reviewed and stable  Post vital signs: Reviewed and stable  Last Vitals:  Vitals Value Taken Time  BP 118/68 07/19/19 1430  Temp    Pulse 74 07/19/19 1430  Resp 16 07/19/19 1430  SpO2 100 % 07/19/19 1430  Vitals shown include unvalidated device data.  Last Pain:  Vitals:   07/19/19 1220  TempSrc: Temporal  PainSc: 5       Patients Stated Pain Goal: 3 (32/76/14 7092)  Complications: No apparent anesthesia complications

## 2019-07-22 ENCOUNTER — Encounter: Payer: Self-pay | Admitting: *Deleted

## 2019-07-22 LAB — SURGICAL PATHOLOGY

## 2019-07-26 ENCOUNTER — Ambulatory Visit (INDEPENDENT_AMBULATORY_CARE_PROVIDER_SITE_OTHER): Payer: Medicare HMO | Admitting: Osteopathic Medicine

## 2019-07-26 ENCOUNTER — Encounter: Payer: Self-pay | Admitting: Orthopaedic Surgery

## 2019-07-26 ENCOUNTER — Encounter: Payer: Self-pay | Admitting: Osteopathic Medicine

## 2019-07-26 ENCOUNTER — Ambulatory Visit (INDEPENDENT_AMBULATORY_CARE_PROVIDER_SITE_OTHER): Payer: Medicare HMO | Admitting: Physician Assistant

## 2019-07-26 ENCOUNTER — Other Ambulatory Visit: Payer: Self-pay

## 2019-07-26 VITALS — BP 135/77 | HR 94 | Temp 97.8°F | Wt 165.0 lb

## 2019-07-26 VITALS — Ht 69.0 in | Wt 160.7 lb

## 2019-07-26 DIAGNOSIS — Z89012 Acquired absence of left thumb: Secondary | ICD-10-CM

## 2019-07-26 DIAGNOSIS — S68012D Complete traumatic metacarpophalangeal amputation of left thumb, subsequent encounter: Secondary | ICD-10-CM

## 2019-07-26 DIAGNOSIS — E118 Type 2 diabetes mellitus with unspecified complications: Secondary | ICD-10-CM

## 2019-07-26 LAB — POCT GLYCOSYLATED HEMOGLOBIN (HGB A1C): Hemoglobin A1C: 6.8 % — AB (ref 4.0–5.6)

## 2019-07-26 MED ORDER — MUPIROCIN 2 % EX OINT: 1.0000 "application " | TOPICAL_OINTMENT | Freq: Two times a day (BID) | CUTANEOUS | 0 refills | Status: DC

## 2019-07-26 MED ORDER — HYDROCODONE-ACETAMINOPHEN 5-325 MG PO TABS: 1.0000 | ORAL_TABLET | Freq: Four times a day (QID) | ORAL | 0 refills | Status: DC | PRN

## 2019-07-26 NOTE — Progress Notes (Signed)
Post-Op Visit Note   Patient: Joseph Grimes           Date of Birth: Aug 11, 1960           MRN: [id number] Visit Date: 07/26/2019 PCP: Emeterio Reeve, DO   Assessment & Plan:  Chief Complaint:  Chief Complaint  Patient presents with  . Left Thumb - Routine Post Op    07/19/2019 left thumb partial amp   Visit Diagnoses:  1. Amputation of left thumb, subsequent encounter     Plan: Patient is a pleasant 58 year old gentleman who presents our clinic today 7 days status post left partial thumb amputation, date of surgery 07/19/2019.  He has been doing well.  He does note throbbing pain most of the time.  No fevers or chills.  Semination of his left thumb reveals a well-healing surgical incision with nylon sutures in place.  No evidence of infection.  There is slight bloody drainage on his bandage.  At this point, we will apply mupirocin and a dry bandage.  He will do this twice a day.  He will follow-up with [place] in a little over a week for likely suture removal.  Call with concerns or questions in the meantime.  Follow-Up Instructions: Return in about 2 weeks (around 08/09/2019).   Orders:  No orders of the defined types were placed in this encounter.  Meds ordered this encounter  Medications  . HYDROcodone-acetaminophen (NORCO) 5-325 MG tablet    Sig: Take 1-2 tablets by mouth every 6 (six) hours as needed for moderate pain.    Dispense:  30 tablet    Refill:  0  . mupirocin ointment (BACTROBAN) 2 %    Sig: Place 1 application into the nose 2 (two) times daily.    Dispense:  22 g    Refill:  0    Imaging: No new imaging   PMFS History: Patient Active Problem List   Diagnosis Date Noted  . Osteomyelitis of finger of left hand ([place])   . Vitamin D deficiency 12/28/2018  . Erectile dysfunction 12/28/2018  . PVC (premature ventricular contraction) 12/27/2018  . Essential hypertension 10/11/2018  . Coronary artery disease 10/11/2018  . Peripheral vascular disease  ([place]) 10/11/2018  . Diabetes mellitus type 2, controlled, with complications ([place]) [id number]  . Mixed hyperlipidemia 10/11/2018  . Tobacco dependence 10/11/2018  . Osteoarthritis 10/11/2018  . CKD (chronic kidney disease) stage 3, GFR 30-59 ml/min 10/11/2018  . History of anemia 10/11/2018  . Other chronic pain 06/08/2018  . Abnormal stress test 04/10/2018  . Non-traumatic rhabdomyolysis 02/01/2018  . Hypokalemia 02/01/2018  . Hypophosphatemia 02/01/2018  . Perianal abscess 10/16/2017  . Perirectal abscess 10/12/2017  . Drug-induced constipation 10/12/2017  . GI bleed 09/15/2017  . Elevated troponin 06/15/2017  . Chronic diastolic heart failure ([place]) 06/15/2017  . Acute on chronic blood loss anemia 06/15/2017  . History of cerebrovascular accident 09/20/2016  . Bone lesion 09/29/2014  . Finger pain, right 09/29/2014  . S/P coronary artery stent placement 12/20/2012  . Old myocardial infarction 10/27/2010   Past Medical History:  Diagnosis Date  . CKD (chronic kidney disease) stage 3, GFR 30-59 ml/min 10/11/2018  . Coronary artery disease 10/11/2018  . Diabetes ([place])   . Diabetes mellitus type 2, controlled, with complications ([place]) [id number]  . Essential hypertension 10/11/2018  . GERD (gastroesophageal reflux disease)   . Heart attack [facility]) 2006   stent placed in right coronary artery  . High cholesterol   . History of  anemia 10/11/2018  . History of low potassium   . Low hemoglobin   . Mixed hyperlipidemia 10/11/2018  . Osteoarthritis 10/11/2018  . Peripheral vascular disease ([place]) 10/11/2018  . Stroke ([place])   . Tobacco dependence 10/11/2018    Family History  Problem Relation Age of Onset  . Diabetes Sister   . Diabetes Brother     Past Surgical History:  Procedure Laterality Date  . AMPUTATION Left 07/19/2019   Procedure: LEFT PARTIAL THUMB AMPUTATION;  Surgeon: Leandrew Koyanagi, MD;  Location: [place];  Service: Orthopedics;  Laterality: Left;  with digital  block  . CARDIAC CATHETERIZATION  2006   04/18/18: no stents, stent in '06  . FEMORAL-POPLITEAL BYPASS GRAFT Right 05/12/2017  . HAND SURGERY    . KNEE ARTHROSCOPY     bilat  . TOE AMPUTATION     x2  . TONSILLECTOMY     Social History   Occupational History  . Not on file  Tobacco Use  . Smoking status: Current Every Day Smoker    Packs/day: 0.50  . Smokeless tobacco: Never Used  Substance and Sexual Activity  . Alcohol use: Not Currently  . Drug use: Never  . Sexual activity: Not Currently    Partners: Female

## 2019-07-26 NOTE — Progress Notes (Signed)
HPI: Joseph Grimes is a 58 y.o. male who  has a past medical history of CKD (chronic kidney disease) stage 3, GFR 30-59 ml/min (10/11/2018), Coronary artery disease (10/11/2018), Diabetes ([place]), Diabetes mellitus type 2, controlled, with complications ([place]) (A999333), Essential hypertension (10/11/2018), GERD (gastroesophageal reflux disease), Heart attack ([place]) (2006), High cholesterol, History of anemia (10/11/2018), History of low potassium, Low hemoglobin, Mixed hyperlipidemia (10/11/2018), Osteoarthritis (10/11/2018), Peripheral vascular disease ([place]) (10/11/2018), Stroke ([place]), and Tobacco dependence (10/11/2018).  he presents to 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  Follow-up today with orthopedics, no concerns from their standpoint.  Patient here today for diabetes follow-up.    DIABETES SCREENING/PREVENTIVE CARE: A1C past 3-6 mos: Yes  controlled? Yes   10/12/18: 12.2!  12/27/18: 7.9  07/26/19 today: 6.8% -patient reports minimal hypoglycemic episodes, if he has these he typically will decrease his dose of insulin.  He is not concerned about these episodes as they have been few and far between.  BP goal <130/80: close!  BP Readings from Last 3 Encounters:  07/26/19 135/77  07/19/19 125/78  06/24/19 124/60   LDL goal <70: No  Eye exam annually: 01/2019, importance discussed with patient Foot exam: done Microalbuminuria:No  Metformin: No -Declines ACE/ARB: No -Declines Antiplatelet if ASCVD Risk >10%: Yes  Statin: Yes  Pneumovax: none on file -Declines  There is no immunization history on file for this patient.      At today's visit 07/26/19 ... PMH, PSH, FH reviewed and updated as needed.  Current medication list and allergy/intolerance hx reviewed and updated as needed. (See remainder of HPI, ROS, Phys Exam below)   No  results found.  No results found for this or any previous visit (from the past 72 hour(s)).        ASSESSMENT/PLAN: The encounter diagnosis was Controlled type 2 diabetes mellitus with complication, without long-term current use of insulin ([place]).   Orders Placed This Encounter  Procedures  . POCT HgB A1C        Follow-up plan: Return in about 4 months (around 11/24/2019) for follow up diabetes, see me sooner if needed! .                                                 ################################################# ################################################# ################################################# #################################################    No outpatient medications have been marked as taking for the 07/26/19 encounter (Appointment) with Emeterio Reeve, DO.    No Known Allergies     Review of Systems:  Constitutional: No recent illness  HEENT: No  headache, no vision change  Cardiac: No  chest pain, No  pressure, No palpitations  Respiratory:  No  shortness of breath. No  Cough  Neurologic: No  weakness, No  Dizziness  Psychiatric: No  concerns with depression, No  concerns with anxiety  Exam:  BP 135/77 (BP Location: Right Arm, Patient Position: Sitting, Cuff Size: Normal)   Pulse 94   Temp 97.8 F (36.6 C) (Oral)   Wt 165 lb 0.6 oz (74.9 kg)   BMI 24.37 kg/m   Constitutional: VS see above. General Appearance: alert, well-developed, well-nourished, NAD  Eyes: Normal lids and conjunctive, non-icteric sclera  Neck: No masses, trachea midline.   Respiratory: Normal respiratory effort. no wheeze, no  rhonchi, no rales, diminished breath sounds bilaterally   Cardiovascular: S1/S2 normal, no murmur, no rub/gallop auscultated. RRR.   Musculoskeletal: Gait normal. Symmetric and independent movement of all extremities  Neurological: Normal balance/coordination. No  tremor.  Skin: warm, dry, intact.   Psychiatric: Normal judgment/insight. Normal mood and affect. Oriented x3.       Visit summary with medication list and pertinent instructions was printed for patient to review, patient was advised to alert [place] if any updates are needed. All questions at time of visit were answered - patient instructed to contact office with any additional concerns. ER/RTC precautions were reviewed with the patient and understanding verbalized.   Note: Total time spent 25 minutes, greater than 50% of the visit was spent face-to-face counseling and coordinating care for the following: The encounter diagnosis was Controlled type 2 diabetes mellitus with complication, without long-term current use of insulin ([place])..  Please note: voice recognition software was used to produce this document, and typos may escape review. Please contact Dr. Sheppard Coil for any needed clarifications.    Follow up plan: Return in about 4 months (around 11/24/2019) for follow up diabetes, see me sooner if needed! Marland Kitchen

## 2019-07-29 ENCOUNTER — Telehealth: Payer: Self-pay

## 2019-07-29 ENCOUNTER — Telehealth: Payer: Self-pay | Admitting: Orthopaedic Surgery

## 2019-07-29 DIAGNOSIS — I509 Heart failure, unspecified: Secondary | ICD-10-CM | POA: Diagnosis not present

## 2019-07-29 DIAGNOSIS — E261 Secondary hyperaldosteronism: Secondary | ICD-10-CM | POA: Diagnosis not present

## 2019-07-29 DIAGNOSIS — Z89012 Acquired absence of left thumb: Secondary | ICD-10-CM | POA: Diagnosis not present

## 2019-07-29 DIAGNOSIS — D692 Other nonthrombocytopenic purpura: Secondary | ICD-10-CM | POA: Diagnosis not present

## 2019-07-29 DIAGNOSIS — Z6824 Body mass index (BMI) 24.0-24.9, adult: Secondary | ICD-10-CM | POA: Diagnosis not present

## 2019-07-29 NOTE — Telephone Encounter (Signed)
Pt called in requesting a call back from someone regarding home health care said he didn't want to talk to me.   [phone]

## 2019-07-29 NOTE — Telephone Encounter (Signed)
Spoke with Caryl Asp, they will contact surgeon

## 2019-07-29 NOTE — Telephone Encounter (Signed)
Order for home health for dressing change for surgical wound should probably be managed by the surgeon, please have nurse contact patient's orthopedic surgeon so that they can specify dressing instructions etc.

## 2019-07-29 NOTE — Telephone Encounter (Signed)
NP Bevelyn Buckles called requesting a referral for at home nursing services for wound dressing care. As per provider, pt's last dressing change was on 07/26/2019 and he is due to have his partial thumb amputation wound cleaned and dressed. NP Kathie Rhodes did not specify a location. Pls advise, thanks.

## 2019-07-30 ENCOUNTER — Telehealth: Payer: Self-pay | Admitting: Orthopaedic Surgery

## 2019-07-30 NOTE — Telephone Encounter (Signed)
Patient called and stated that he is needing a Home health service and Erlinda Hong needs to put order in.  Please call patient to advise  [phone]

## 2019-07-31 ENCOUNTER — Telehealth: Payer: Self-pay

## 2019-07-31 NOTE — Telephone Encounter (Signed)
Pt called requesting a med refill for oxycodone. As per pt, he had mentioned about possibly wanting a med refill from provider. Pls send rx to [facility].

## 2019-07-31 NOTE — Telephone Encounter (Signed)
See other message

## 2019-07-31 NOTE — Telephone Encounter (Signed)
Yes approve

## 2019-08-01 MED ORDER — HYDROCODONE-ACETAMINOPHEN 5-325 MG PO TABS: 1.0000 | ORAL_TABLET | Freq: Three times a day (TID) | ORAL | 0 refills | Status: AC | PRN

## 2019-08-01 NOTE — Telephone Encounter (Signed)
Will send limited supply given holiday weekend but he needs to discuss pain management with his surgeon if he is having issues.

## 2019-08-05 NOTE — Telephone Encounter (Signed)
Faxed order to [facility]

## 2019-08-06 ENCOUNTER — Other Ambulatory Visit: Payer: Self-pay

## 2019-08-06 ENCOUNTER — Telehealth: Payer: Self-pay

## 2019-08-06 DIAGNOSIS — S68012D Complete traumatic metacarpophalangeal amputation of left thumb, subsequent encounter: Secondary | ICD-10-CM

## 2019-08-06 NOTE — Telephone Encounter (Signed)
I had faxed HHPT order to [facility]. States patient will need to go to outpatient PT instead since in will not cover/approve since he does not have a medical reason why patient cannot go out to Outpatient PT per [facility] guidelines.   Called patient to advise no answer LMOM. If he calls back please advise. Thanks.   Will put outpatient referral for PT

## 2019-08-06 NOTE — Telephone Encounter (Signed)
Patient called back and I advised him of the message.  He stated that he does not need PT, but instead would like someone to come out to change the bandages on his thumb which needs to be done every couple of days and he is not able to do that.  CB#[phone].  Thank you.

## 2019-08-07 DIAGNOSIS — Z794 Long term (current) use of insulin: Secondary | ICD-10-CM | POA: Diagnosis not present

## 2019-08-07 DIAGNOSIS — E113393 Type 2 diabetes mellitus with moderate nonproliferative diabetic retinopathy without macular edema, bilateral: Secondary | ICD-10-CM | POA: Diagnosis not present

## 2019-08-07 DIAGNOSIS — E1140 Type 2 diabetes mellitus with diabetic neuropathy, unspecified: Secondary | ICD-10-CM | POA: Diagnosis not present

## 2019-08-07 NOTE — Telephone Encounter (Signed)
See messages below.  

## 2019-08-07 NOTE — Telephone Encounter (Signed)
He needs to be applying mupirocin and dry bandage twice daily.  Is he not able to do this?  Does he have anyone that can help him?  If not, is it possible to have someone go out and help him?

## 2019-08-08 ENCOUNTER — Telehealth: Payer: Self-pay | Admitting: Orthopaedic Surgery

## 2019-08-08 ENCOUNTER — Other Ambulatory Visit: Payer: Self-pay | Admitting: Physician Assistant

## 2019-08-08 MED ORDER — HYDROCODONE-ACETAMINOPHEN 5-325 MG PO TABS: 1.0000 | ORAL_TABLET | Freq: Every day | ORAL | 0 refills | Status: DC | PRN

## 2019-08-08 NOTE — Telephone Encounter (Signed)
Patient called in requesting refill on hydrocodone pain medication. Patient asked for medication be filled at [facility] in [place]. Patient phone number is [phone].

## 2019-08-12 ENCOUNTER — Other Ambulatory Visit: Payer: Self-pay | Admitting: Physician Assistant

## 2019-08-12 NOTE — Telephone Encounter (Signed)
He states he would like to discuss PT and also dressing changes with MD tomorrow at his appt.

## 2019-08-12 NOTE — Telephone Encounter (Signed)
I filled this on 12/31

## 2019-08-13 ENCOUNTER — Other Ambulatory Visit: Payer: Self-pay

## 2019-08-13 ENCOUNTER — Ambulatory Visit (INDEPENDENT_AMBULATORY_CARE_PROVIDER_SITE_OTHER): Payer: Medicare HMO | Admitting: Physician Assistant

## 2019-08-13 ENCOUNTER — Encounter: Payer: Self-pay | Admitting: Orthopaedic Surgery

## 2019-08-13 DIAGNOSIS — S68012D Complete traumatic metacarpophalangeal amputation of left thumb, subsequent encounter: Secondary | ICD-10-CM

## 2019-08-13 DIAGNOSIS — Z89012 Acquired absence of left thumb: Secondary | ICD-10-CM

## 2019-08-13 MED ORDER — HYDROCODONE-ACETAMINOPHEN 5-325 MG PO TABS: 1.0000 | ORAL_TABLET | Freq: Two times a day (BID) | ORAL | 0 refills | Status: DC | PRN

## 2019-08-13 NOTE — Progress Notes (Signed)
Post-Op Visit Note   Patient: Joseph Grimes           Date of Birth: 1961-04-10           MRN: [id number] Visit Date: 08/13/2019 PCP: Emeterio Reeve, DO   Assessment & Plan:  Chief Complaint:  Chief Complaint  Patient presents with  . Left Thumb - Follow-up   Visit Diagnoses:  1. Amputation of left thumb, subsequent encounter     Plan: Patient is a pleasant 59 year old gentleman who presents our clinic today 3-1/2 weeks out left partial thumb amputation, date of surgery 07/19/2019.  He has been doing okay.  No fevers or chills.  He does note a throbbing to the thumb.  He has been taking a fair amount of Norco for this.  He has been doing mupirocin bandage changes every other day as opposed to twice daily like we discussed at his last appointment.  examination of the left thumb reveals well-healing surgical incision with nylon sutures in place.  No evidence of infection or cellulitis.  Today, nylon sutures were removed.  He will advance with activity as tolerated.  Follow-up with [place] in 4 weeks time for recheck.  Call with concerns or questions.  Follow-Up Instructions: Return in about 4 weeks (around 09/10/2019).   Orders:  No orders of the defined types were placed in this encounter.  Meds ordered this encounter  Medications  . HYDROcodone-acetaminophen (NORCO) 5-325 MG tablet    Sig: Take 1 tablet by mouth 2 (two) times daily as needed for moderate pain.    Dispense:  20 tablet    Refill:  0    Imaging: No new imaging  PMFS History: Patient Active Problem List   Diagnosis Date Noted  . Osteomyelitis of finger of left hand ([place])   . Vitamin D deficiency 12/28/2018  . Erectile dysfunction 12/28/2018  . PVC (premature ventricular contraction) 12/27/2018  . Essential hypertension 10/11/2018  . Coronary artery disease 10/11/2018  . Peripheral vascular disease ([place]) 10/11/2018  . Diabetes mellitus type 2, controlled, with complications ([place]) [id number]  . Mixed  hyperlipidemia 10/11/2018  . Tobacco dependence 10/11/2018  . Osteoarthritis 10/11/2018  . CKD (chronic kidney disease) stage 3, GFR 30-59 ml/min 10/11/2018  . History of anemia 10/11/2018  . Other chronic pain 06/08/2018  . Abnormal stress test 04/10/2018  . Non-traumatic rhabdomyolysis 02/01/2018  . Hypokalemia 02/01/2018  . Hypophosphatemia 02/01/2018  . Perianal abscess 10/16/2017  . Perirectal abscess 10/12/2017  . Drug-induced constipation 10/12/2017  . GI bleed 09/15/2017  . Elevated troponin 06/15/2017  . Chronic diastolic heart failure ([place]) 06/15/2017  . Acute on chronic blood loss anemia 06/15/2017  . History of cerebrovascular accident 09/20/2016  . Bone lesion 09/29/2014  . Finger pain, right 09/29/2014  . S/P coronary artery stent placement 12/20/2012  . Old myocardial infarction 10/27/2010   Past Medical History:  Diagnosis Date  . CKD (chronic kidney disease) stage 3, GFR 30-59 ml/min 10/11/2018  . Coronary artery disease 10/11/2018  . Diabetes ([place])   . Diabetes mellitus type 2, controlled, with complications ([place]) [id number]  . Essential hypertension 10/11/2018  . GERD (gastroesophageal reflux disease)   . Heart attack [facility]) 2006   stent placed in right coronary artery  . High cholesterol   . History of anemia 10/11/2018  . History of low potassium   . Low hemoglobin   . Mixed hyperlipidemia 10/11/2018  . Osteoarthritis 10/11/2018  . Peripheral vascular disease ([place]) 10/11/2018  . Stroke ([place])   .  Tobacco dependence 10/11/2018    Family History  Problem Relation Age of Onset  . Diabetes Sister   . Diabetes Brother     Past Surgical History:  Procedure Laterality Date  . AMPUTATION Left 07/19/2019   Procedure: LEFT PARTIAL THUMB AMPUTATION;  Surgeon: Leandrew Koyanagi, MD;  Location: [place];  Service: Orthopedics;  Laterality: Left;  with digital block  . CARDIAC CATHETERIZATION  2006   04/18/18: no stents, stent in '06  . FEMORAL-POPLITEAL BYPASS  GRAFT Right 05/12/2017  . HAND SURGERY    . KNEE ARTHROSCOPY     bilat  . TOE AMPUTATION     x2  . TONSILLECTOMY     Social History   Occupational History  . Not on file  Tobacco Use  . Smoking status: Current Every Day Smoker    Packs/day: 0.50  . Smokeless tobacco: Never Used  Substance and Sexual Activity  . Alcohol use: Not Currently  . Drug use: Never  . Sexual activity: Not Currently    Partners: Female

## 2019-08-19 ENCOUNTER — Other Ambulatory Visit: Payer: Self-pay | Admitting: Osteopathic Medicine

## 2019-08-23 ENCOUNTER — Telehealth: Payer: Self-pay | Admitting: Osteopathic Medicine

## 2019-08-23 NOTE — Telephone Encounter (Signed)
Received a fax from [facility] that Plavix is approved through 08/07/2020.

## 2019-08-23 NOTE — Telephone Encounter (Signed)
Received fax from [employer] that Plavix requires a PA. Information has been sent to the insurance company. Awaiting determination.

## 2019-08-26 ENCOUNTER — Telehealth: Payer: Self-pay

## 2019-08-26 NOTE — Telephone Encounter (Signed)
Pt left two msgs today with his name, DOB and contact number. No other info was given as to reason of call. Did not return a call back to pt since medical assistant vm msg is clear on clinic's protocol for leaving a msg.

## 2019-08-28 NOTE — Progress Notes (Deleted)
Subjective:   Joseph Grimes is a 59 y.o. male who presents for Medicare Annual/Subsequent preventive examination.  Review of Systems:  No ROS.  Medicare Wellness Virtual Visit.  Visual/audio telehealth visit, UTA vital signs.   See social history for additional risk factors.      Sleep patterns:  Home Safety/Smoke Alarms: Feels safe in home. Smoke alarms in place.  Living environment; Seat Belt Safety/Bike Helmet: Wears seat belt.   Male:   CCS-     PSA- No results found for: PSA       Objective:    Vitals: There were no vitals taken for this visit.  There is no height or weight on file to calculate BMI.  Advanced Directives 07/19/2019 07/12/2019  Does Patient Have a Medical Advance Directive? No Yes  Type of Advance Directive - Living will;Healthcare Power of Attorney  Does patient want to make changes to medical advance directive? No - Patient declined No - Patient declined  Would patient like information on creating a medical advance directive? No - Patient declined -    Tobacco Social History   Tobacco Use  Smoking Status Current Every Day Smoker  . Packs/day: 0.50  Smokeless Tobacco Never Used     Ready to quit: Not Answered Counseling given: Not Answered   Clinical Intake:                       Past Medical History:  Diagnosis Date  . CKD (chronic kidney disease) stage 3, GFR 30-59 ml/min 10/11/2018  . Coronary artery disease 10/11/2018  . Diabetes ([place])   . Diabetes mellitus type 2, controlled, with complications ([place]) [id number]  . Essential hypertension 10/11/2018  . GERD (gastroesophageal reflux disease)   . Heart attack [facility]) 2006   stent placed in right coronary artery  . High cholesterol   . History of anemia 10/11/2018  . History of low potassium   . Low hemoglobin   . Mixed hyperlipidemia 10/11/2018  . Osteoarthritis 10/11/2018  . Peripheral vascular disease ([place]) 10/11/2018  . Stroke ([place])   . Tobacco dependence 10/11/2018   Past  Surgical History:  Procedure Laterality Date  . AMPUTATION Left 07/19/2019   Procedure: LEFT PARTIAL THUMB AMPUTATION;  Surgeon: Leandrew Koyanagi, MD;  Location: [place];  Service: Orthopedics;  Laterality: Left;  with digital block  . CARDIAC CATHETERIZATION  2006   04/18/18: no stents, stent in '06  . FEMORAL-POPLITEAL BYPASS GRAFT Right 05/12/2017  . HAND SURGERY    . KNEE ARTHROSCOPY     bilat  . TOE AMPUTATION     x2  . TONSILLECTOMY     Family History  Problem Relation Age of Onset  . Diabetes Sister   . Diabetes Brother    Social History   Socioeconomic History  . Marital status: Married    Spouse name: Not on file  . Number of children: Not on file  . Years of education: Not on file  . Highest education level: Not on file  Occupational History  . Not on file  Tobacco Use  . Smoking status: Current Every Day Smoker    Packs/day: 0.50  . Smokeless tobacco: Never Used  Substance and Sexual Activity  . Alcohol use: Not Currently  . Drug use: Never  . Sexual activity: Not Currently    Partners: Female  Other Topics Concern  . Not on file  Social History Narrative  . Not on file  Social Determinants of Health   Financial Resource Strain:   . Difficulty of Paying Living Expenses: Not on file  Food Insecurity:   . Worried About Charity fundraiser in the Last Year: Not on file  . Ran Out of Food in the Last Year: Not on file  Transportation Needs:   . Lack of Transportation (Medical): Not on file  . Lack of Transportation (Non-Medical): Not on file  Physical Activity:   . Days of Exercise per Week: Not on file  . Minutes of Exercise per Session: Not on file  Stress:   . Feeling of Stress : Not on file  Social Connections:   . Frequency of Communication with Friends and Family: Not on file  . Frequency of Social Gatherings with Friends and Family: Not on file  . Attends Religious Services: Not on file  . Active Member of Clubs or  Organizations: Not on file  . Attends Archivist Meetings: Not on file  . Marital Status: Not on file    Outpatient Encounter Medications as of 09/04/2019  Medication Sig  . ACCU-CHEK AVIVA PLUS test strip   . Alcohol Swabs (B-D SINGLE USE SWABS REGULAR) PADS   . aspirin 81 MG tablet Take 1 tablet (81 mg total) by mouth daily.  . cilostazol (PLETAL) 100 MG tablet Take 1 tablet (100 mg total) by mouth 2 (two) times daily.  . clopidogrel (PLAVIX) 75 MG tablet TAKE 1 TABLET EVERY DAY  . cyanocobalamin ([employer] VITAMIN B12) 1000 MCG tablet Take by mouth.  . ferrous sulfate 325 (65 FE) MG tablet Take 1 tablet (325 mg total) by mouth 2 (two) times daily with a meal.  . furosemide (LASIX) 20 MG tablet TAKE 1 TABLET EVERY DAY  . HYDROcodone-acetaminophen (NORCO) 5-325 MG tablet Take 1 tablet by mouth 2 (two) times daily as needed for moderate pain.  Marland Kitchen insulin NPH-regular Human (70-30) 100 UNIT/ML injection Inject 30 Units into the skin 2 (two) times daily.   . mupirocin ointment (BACTROBAN) 2 % Place 1 application into the nose 2 (two) times daily.  . ondansetron (ZOFRAN) 4 MG tablet Take 1-2 tablets (4-8 mg total) by mouth every 8 (eight) hours as needed for nausea or vomiting.  . pantoprazole (PROTONIX) 40 MG tablet TAKE 1 TABLET (40 MG TOTAL) BY MOUTH DAILY.  Marland Kitchen potassium chloride (K-DUR) 10 MEQ tablet Take 1 tablet (10 mEq total) by mouth daily.  . potassium chloride (KLOR-CON) 10 MEQ tablet TAKE 1 TABLET EVERY DAY  . rosuvastatin (CRESTOR) 40 MG tablet Take 1 tablet (40 mg total) by mouth daily.  Marland Kitchen sulfamethoxazole-trimethoprim (BACTRIM DS) 800-160 MG tablet Take 1 tablet by mouth 2 (two) times daily.   No facility-administered encounter medications on file as of 09/04/2019.    Activities of Daily Living In your present state of health, do you have any difficulty performing the following activities: 07/19/2019  Hearing? N  Vision? N  Difficulty concentrating or making decisions? N   Walking or climbing stairs? N  Dressing or bathing? N  Some recent data might be hidden    Patient Care Team: Emeterio Reeve, DO as PCP - General (Osteopathic Medicine)   Assessment:   This is a routine wellness examination for [place].Physical assessment deferred to PCP.   Exercise Activities and Dietary recommendations   Diet  Breakfast: Lunch:  Dinner:       Goals   None     Fall Risk Fall Risk  06/24/2019  Falls in the  past year? 0  Number falls in past yr: 0  Injury with Fall? 0   Is the patient's home free of loose throw rugs in walkways, pet beds, electrical cords, etc?   {Blank single:19197::"yes","no"}      Grab bars in the bathroom? {Blank single:19197::"yes","no"}      Handrails on the stairs?   {Blank single:19197::"yes","no"}      Adequate lighting?   {Blank single:19197::"yes","no"}   Depression Screen PHQ 2/9 Scores 10/11/2018  PHQ - 2 Score 0    Cognitive Function         There is no immunization history on file for this patient.  Screening Tests Health Maintenance  Topic Date Due  . URINE MICROALBUMIN  07/28/1969  . TETANUS/TDAP  10/11/2019 (Originally 07/28/1978)  . INFLUENZA VACCINE  11/06/2019 (Originally 03/09/2019)  . PNEUMOCOCCAL POLYSACCHARIDE VACCINE AGE 51-64 HIGH RISK  03/20/2020 (Originally 07/28/1961)  . Hepatitis C Screening  03/20/2020 (Originally [birth year])  . HIV Screening  03/20/2020 (Originally 07/28/1974)  . COLONOSCOPY  06/09/2020 (Originally 07/28/2009)  . FOOT EXAM  12/27/2019  . OPHTHALMOLOGY EXAM  01/23/2020  . HEMOGLOBIN A1C  01/24/2020       Plan:   ***  I have personally reviewed and noted the following in the patient's chart:   . Medical and social history . Use of alcohol, tobacco or illicit drugs  . Current medications and supplements . Functional ability and status . Nutritional status . Physical activity . Advanced directives . List of other physicians . Hospitalizations, surgeries, and ER  visits in previous 12 months . Vitals . Screenings to include cognitive, depression, and falls . Referrals and appointments  In addition, I have reviewed and discussed with patient certain preventive protocols, quality metrics, and best practice recommendations. A written personalized care plan for preventive services as well as general preventive health recommendations were provided to patient.     Joanne Chars, LPN  [id number]

## 2019-09-04 ENCOUNTER — Ambulatory Visit: Payer: Medicare HMO

## 2019-09-10 ENCOUNTER — Telehealth: Payer: Self-pay

## 2019-09-10 ENCOUNTER — Ambulatory Visit (INDEPENDENT_AMBULATORY_CARE_PROVIDER_SITE_OTHER): Payer: Medicare HMO | Admitting: Physician Assistant

## 2019-09-10 ENCOUNTER — Other Ambulatory Visit: Payer: Self-pay

## 2019-09-10 ENCOUNTER — Encounter: Payer: Self-pay | Admitting: Orthopaedic Surgery

## 2019-09-10 DIAGNOSIS — S68012D Complete traumatic metacarpophalangeal amputation of left thumb, subsequent encounter: Secondary | ICD-10-CM

## 2019-09-10 DIAGNOSIS — Z89012 Acquired absence of left thumb: Secondary | ICD-10-CM

## 2019-09-10 NOTE — Progress Notes (Signed)
Post-Op Visit Note   Patient: Joseph Grimes           Date of Birth: 1960/11/11           MRN: [id number] Visit Date: 09/10/2019 PCP: Emeterio Reeve, DO   Assessment & Plan:  Chief Complaint:  Chief Complaint  Patient presents with  . Left Thumb - Follow-up   Visit Diagnoses:  1. Amputation of left thumb, subsequent encounter     Plan: Patient is a pleasant 59 year old gentleman who comes and almost 8 weeks out left partial thumb amputation 07/19/2019.  He has been doing well.  He denies any pain to the thumb.  He has not been doing mupirocin dressing changes for the past week due to running out of supplies.  No fevers or chills.  Examination of left thumb reveals a fully healed surgical scar without complication.  He does have fair amount of dry skin to the tip of the thumb.  At this point, we will have him use mupirocin to soften his tissue.  He will advance with activity as tolerated and follow-up with [place] as needed.  Call with concerns or questions in the meantime.  Follow-Up Instructions: Return if symptoms worsen or fail to improve.   Orders:  No orders of the defined types were placed in this encounter.  No orders of the defined types were placed in this encounter.   Imaging: No new imaging   PMFS History: Patient Active Problem List   Diagnosis Date Noted  . Osteomyelitis of finger of left hand ([place])   . Vitamin D deficiency 12/28/2018  . Erectile dysfunction 12/28/2018  . PVC (premature ventricular contraction) 12/27/2018  . Essential hypertension 10/11/2018  . Coronary artery disease 10/11/2018  . Peripheral vascular disease ([place]) 10/11/2018  . Diabetes mellitus type 2, controlled, with complications ([place]) [id number]  . Mixed hyperlipidemia 10/11/2018  . Tobacco dependence 10/11/2018  . Osteoarthritis 10/11/2018  . CKD (chronic kidney disease) stage 3, GFR 30-59 ml/min 10/11/2018  . History of anemia 10/11/2018  . Other chronic pain 06/08/2018  .  Abnormal stress test 04/10/2018  . Non-traumatic rhabdomyolysis 02/01/2018  . Hypokalemia 02/01/2018  . Hypophosphatemia 02/01/2018  . Perianal abscess 10/16/2017  . Perirectal abscess 10/12/2017  . Drug-induced constipation 10/12/2017  . GI bleed 09/15/2017  . Elevated troponin 06/15/2017  . Chronic diastolic heart failure ([place]) 06/15/2017  . Acute on chronic blood loss anemia 06/15/2017  . History of cerebrovascular accident 09/20/2016  . Bone lesion 09/29/2014  . Finger pain, right 09/29/2014  . S/P coronary artery stent placement 12/20/2012  . Old myocardial infarction 10/27/2010   Past Medical History:  Diagnosis Date  . CKD (chronic kidney disease) stage 3, GFR 30-59 ml/min 10/11/2018  . Coronary artery disease 10/11/2018  . Diabetes ([place])   . Diabetes mellitus type 2, controlled, with complications ([place]) [id number]  . Essential hypertension 10/11/2018  . GERD (gastroesophageal reflux disease)   . Heart attack [facility]) 2006   stent placed in right coronary artery  . High cholesterol   . History of anemia 10/11/2018  . History of low potassium   . Low hemoglobin   . Mixed hyperlipidemia 10/11/2018  . Osteoarthritis 10/11/2018  . Peripheral vascular disease ([place]) 10/11/2018  . Stroke ([place])   . Tobacco dependence 10/11/2018    Family History  Problem Relation Age of Onset  . Diabetes Sister   . Diabetes Brother     Past Surgical History:  Procedure Laterality Date  . AMPUTATION Left  07/19/2019   Procedure: LEFT PARTIAL THUMB AMPUTATION;  Surgeon: Leandrew Koyanagi, MD;  Location: [place];  Service: Orthopedics;  Laterality: Left;  with digital block  . CARDIAC CATHETERIZATION  2006   04/18/18: no stents, stent in '06  . FEMORAL-POPLITEAL BYPASS GRAFT Right 05/12/2017  . HAND SURGERY    . KNEE ARTHROSCOPY     bilat  . TOE AMPUTATION     x2  . TONSILLECTOMY     Social History   Occupational History  . Not on file  Tobacco Use  . Smoking status: Current Every Day  Smoker    Packs/day: 0.50  . Smokeless tobacco: Never Used  Substance and Sexual Activity  . Alcohol use: Not Currently  . Drug use: Never  . Sexual activity: Not Currently    Partners: Female

## 2019-09-10 NOTE — Telephone Encounter (Signed)
Pt left a vm msg requesting a call back. No specific details given for reason of call. Attempted to contacted, no answer. Left pt a msg to return a call back to clinic for assistance. Direct call back info provided.

## 2019-09-10 NOTE — Telephone Encounter (Signed)
Pt called requesting a med refill for oxycodone. Pls advise, thanks.

## 2019-09-11 NOTE — Telephone Encounter (Signed)
Denied PDMP reviewed Looks like he's recently gotten this from a different prescriber last filled 08/16/19 - orthopedics, he needs to follow up with them  He should be relatively well recovered from surgery by now

## 2019-09-11 NOTE — Telephone Encounter (Signed)
Pt returned call back - upset that request was denied. As per pt, he got a rx for hydrocodone-ace filled in January for the pain in his amputated thumb. He mentioned that he is requesting oxycodone, which provider has filled in the past for body pains and is different from hydrocodone-ace. Pt states that provider has made an error in denying his refill. Pt insists on med refill for oxycodone. Pls advise, thanks.

## 2019-09-11 NOTE — Telephone Encounter (Signed)
Attempted to contact pt, no answer. Left a detailed vm msg for pt regarding provider's note. Pt aware to contact orthopedics office regarding oxycodone rx. Direct call back info provided.

## 2019-09-12 MED ORDER — OXYCODONE HCL 10 MG PO TABS: 5.0000 mg | ORAL_TABLET | Freq: Four times a day (QID) | ORAL | 0 refills | Status: DC | PRN

## 2019-09-12 NOTE — Telephone Encounter (Signed)
He does not have opiate agreement on file with [place], so this needs to be established if he is going to request narcotics from this office for chronic use. He needs visit in office. I'll refill for now but NO NARCOTICS AGAIN UNTIL SEEN IN OFFICE

## 2019-09-12 NOTE — Telephone Encounter (Signed)
Called pt, no answer. Left a detailed vm msg for pt regarding provider's note / recommendation. Aware oxycodone rx refill sent to local pharmacy. Pt informed of clinic's policy regarding management of controlled medications. Direct call back info provided.

## 2019-10-21 DIAGNOSIS — I509 Heart failure, unspecified: Secondary | ICD-10-CM | POA: Diagnosis not present

## 2019-10-21 DIAGNOSIS — Z89012 Acquired absence of left thumb: Secondary | ICD-10-CM | POA: Diagnosis not present

## 2019-10-21 DIAGNOSIS — N183 Chronic kidney disease, stage 3 unspecified: Secondary | ICD-10-CM | POA: Diagnosis not present

## 2019-10-21 DIAGNOSIS — Z7902 Long term (current) use of antithrombotics/antiplatelets: Secondary | ICD-10-CM | POA: Diagnosis not present

## 2019-10-21 DIAGNOSIS — E261 Secondary hyperaldosteronism: Secondary | ICD-10-CM | POA: Diagnosis not present

## 2019-10-21 DIAGNOSIS — G63 Polyneuropathy in diseases classified elsewhere: Secondary | ICD-10-CM | POA: Diagnosis not present

## 2019-10-21 DIAGNOSIS — E1169 Type 2 diabetes mellitus with other specified complication: Secondary | ICD-10-CM | POA: Diagnosis not present

## 2019-10-21 DIAGNOSIS — Z7982 Long term (current) use of aspirin: Secondary | ICD-10-CM | POA: Diagnosis not present

## 2019-10-21 DIAGNOSIS — E1122 Type 2 diabetes mellitus with diabetic chronic kidney disease: Secondary | ICD-10-CM | POA: Diagnosis not present

## 2019-10-21 DIAGNOSIS — Z794 Long term (current) use of insulin: Secondary | ICD-10-CM | POA: Diagnosis not present

## 2019-10-21 DIAGNOSIS — E1151 Type 2 diabetes mellitus with diabetic peripheral angiopathy without gangrene: Secondary | ICD-10-CM | POA: Diagnosis not present

## 2019-10-21 DIAGNOSIS — F1120 Opioid dependence, uncomplicated: Secondary | ICD-10-CM | POA: Diagnosis not present

## 2019-10-21 DIAGNOSIS — Z95820 Peripheral vascular angioplasty status with implants and grafts: Secondary | ICD-10-CM | POA: Diagnosis not present

## 2019-10-21 DIAGNOSIS — Z6823 Body mass index (BMI) 23.0-23.9, adult: Secondary | ICD-10-CM | POA: Diagnosis not present

## 2019-10-22 ENCOUNTER — Ambulatory Visit (INDEPENDENT_AMBULATORY_CARE_PROVIDER_SITE_OTHER): Payer: Medicare HMO | Admitting: Osteopathic Medicine

## 2019-10-22 ENCOUNTER — Encounter: Payer: Self-pay | Admitting: Osteopathic Medicine

## 2019-10-22 ENCOUNTER — Other Ambulatory Visit: Payer: Self-pay | Admitting: Osteopathic Medicine

## 2019-10-22 VITALS — BP 137/72 | HR 99 | Wt 158.0 lb

## 2019-10-22 DIAGNOSIS — M1990 Unspecified osteoarthritis, unspecified site: Secondary | ICD-10-CM

## 2019-10-22 DIAGNOSIS — Z79899 Other long term (current) drug therapy: Secondary | ICD-10-CM | POA: Diagnosis not present

## 2019-10-22 DIAGNOSIS — L989 Disorder of the skin and subcutaneous tissue, unspecified: Secondary | ICD-10-CM

## 2019-10-22 DIAGNOSIS — E118 Type 2 diabetes mellitus with unspecified complications: Secondary | ICD-10-CM | POA: Diagnosis not present

## 2019-10-22 LAB — POCT UA - MICROALBUMIN
Albumin/Creatinine Ratio, Urine, POC: >300
Creatinine, POC: 50 mg/dL
Microalbumin Ur, POC: 150 mg/L

## 2019-10-22 LAB — POCT GLYCOSYLATED HEMOGLOBIN (HGB A1C): Hemoglobin A1C: 7.1 % — AB (ref 4.0–5.6)

## 2019-10-22 MED ORDER — HYDROCODONE-ACETAMINOPHEN 5-325 MG PO TABS: 1.0000 | ORAL_TABLET | Freq: Three times a day (TID) | ORAL | 0 refills | Status: DC | PRN

## 2019-10-22 NOTE — Progress Notes (Signed)
Joseph Grimes is a 59 y.o. male who presents to  [place] [facility]  today, 10/22/19, seeking care for the following: . Rash  . Pain Rx     ASSESSMENT & PLAN with other pertinent history/findings:  The primary encounter diagnosis was Skin lesion. Diagnoses of Controlled type 2 diabetes mellitus with complication, without long-term current use of insulin (HCC) and Osteoarthritis, unspecified osteoarthritis type, unspecified site were also pertinent to this visit.  1. Controlled type 2 diabetes mellitus with complication, without long-term current use of insulin (HCC) A1C ok  2. Osteoarthritis, unspecified osteoarthritis type, unspecified site Pain management: limited use of opiates is ok with me but sparing use to prevent tolerance/dependence. Pt reports his R thumb amputation bothers him somewhat as well as other aches/pains in knees and feet on occasion. UDs today or tomorrow, controlled substance agreement in place   3. Skin lesions L scrotum - few months, lump, nonpainful. On exam, pea-sized lump, firm, non-ulcerated, fleshy-colored. Ddx genital wart vs basal cell vs sebaceous cyst.  R hip - 3 mos, few scattered crusting lesions. Ddx inflamed seborrheic keratosis vs squamous cell  Unsure what to make of these skin lesions, destruction w/o clear diagnosis seems unwise, thin scrotal skin would like dermatology opinion re: biopsy vs other management. Thanks!   Orders Placed This Encounter  Procedures  . Pain Mgmt, Profile 5 w/o Conf, U  . Ambulatory referral to Dermatology  . POCT HgB A1C    Meds ordered this encounter  Medications  . HYDROcodone-acetaminophen (NORCO/VICODIN) 5-325 MG tablet    Sig: Take 1-2 tablets by mouth every 8 (eight) hours as needed for moderate pain. #30 must last at least 90 days (until 01/20/20)    Dispense:  30 tablet    Refill:  0       Follow-up instructions: Return in about 3 months (around  01/22/2020) for recheck A1C, continue controlled substance pain Rx .                                         BP 137/72 (BP Location: Left Arm, Patient Position: Sitting, Cuff Size: Normal)   Pulse 99   Wt 158 lb 0.6 oz (71.7 kg)   BMI 23.34 kg/m   Current Meds  Medication Sig  . ACCU-CHEK AVIVA PLUS test strip   . Accu-Chek Softclix Lancets lancets Use to monitor blood glucose 4 time(s) daily  . aspirin 81 MG tablet Take 1 tablet (81 mg total) by mouth daily.  . cilostazol (PLETAL) 100 MG tablet Take 1 tablet (100 mg total) by mouth 2 (two) times daily.  . clopidogrel (PLAVIX) 75 MG tablet TAKE 1 TABLET EVERY DAY  . cyanocobalamin ([employer]S VITAMIN B12) 1000 MCG tablet Take by mouth.  . ferrous sulfate 325 (65 FE) MG tablet Take 1 tablet (325 mg total) by mouth 2 (two) times daily with a meal.  . furosemide (LASIX) 20 MG tablet TAKE 1 TABLET EVERY DAY  . insulin NPH-regular Human (70-30) 100 UNIT/ML injection Inject 30 Units into the skin 2 (two) times daily.   . mupirocin ointment (BACTROBAN) 2 % Place 1 application into the nose 2 (two) times daily.  . ondansetron (ZOFRAN) 4 MG tablet Take 1-2 tablets (4-8 mg total) by mouth every 8 (eight) hours as needed for nausea or vomiting.  . Oxycodone HCl 10 MG TABS Take  0.5-1 tablets (5-10 mg total) by mouth every 6 (six) hours as needed.  . pantoprazole (PROTONIX) 40 MG tablet TAKE 1 TABLET (40 MG TOTAL) BY MOUTH DAILY.  Marland Kitchen potassium chloride (K-DUR) 10 MEQ tablet Take 1 tablet (10 mEq total) by mouth daily.  . potassium chloride (KLOR-CON) 10 MEQ tablet TAKE 1 TABLET EVERY DAY  . rosuvastatin (CRESTOR) 40 MG tablet Take 1 tablet (40 mg total) by mouth daily.  Marland Kitchen sulfamethoxazole-trimethoprim (BACTRIM DS) 800-160 MG tablet Take 1 tablet by mouth 2 (two) times daily.    Results for orders placed or performed in visit on 10/22/19 (from the past 72 hour(s))  POCT HgB A1C     Status: Abnormal   Collection Time:  10/22/19  3:09 PM  Result Value Ref Range   Hemoglobin A1C 7.1 (A) 4.0 - 5.6 %   HbA1c POC (<> result, manual entry)     HbA1c, POC (prediabetic range)     HbA1c, POC (controlled diabetic range)      No results found.  Depression screen [facility] 2/9 10/11/2018  Decreased Interest 0  Down, Depressed, Hopeless 0  PHQ - 2 Score 0    GAD 7 : Generalized Anxiety Score 10/11/2018  Nervous, Anxious, on Edge 0  Control/stop worrying 0  Worry too much - different things 0  Trouble relaxing 0  Restless 0  Easily annoyed or irritable 0  Afraid - awful might happen 0  Total GAD 7 Score 0      All questions at time of visit were answered - patient instructed to contact office with any additional concerns or updates.  ER/RTC precautions were reviewed with the patient.  Please note: voice recognition software was used to produce this document, and typos may escape review. Please contact Dr. Sheppard Coil for any needed clarifications.   Total encounter time: 30 minutes.

## 2019-10-22 NOTE — Addendum Note (Signed)
Addended by: Peggye Ley on: 10/22/2019 03:44 PM   Modules accepted: Orders

## 2019-10-23 ENCOUNTER — Telehealth: Payer: Self-pay

## 2019-10-23 LAB — PAIN MGMT, PROFILE 5 W/O CONF, U
Amphetamines: NEGATIVE ng/mL
Barbiturates: NEGATIVE ng/mL
Benzodiazepines: NEGATIVE ng/mL
Cocaine Metabolite: NEGATIVE ng/mL
Creatinine: 34.6 mg/dL
Marijuana Metabolite: NEGATIVE ng/mL
Methadone Metabolite: NEGATIVE ng/mL
Opiates: NEGATIVE ng/mL
Oxidant: NEGATIVE ug/mL
Oxycodone: NEGATIVE ng/mL
pH: 5.5 (ref 4.5–9.0)

## 2019-10-23 NOTE — Telephone Encounter (Signed)
Pt called stating that provider sent in the wrong pain medication to the pharmacy. As per pt, he is requesting Oxycodone not Hydrocodone. Requesting that provider expedite the request. Pls advise, thanks.

## 2019-10-23 NOTE — Telephone Encounter (Signed)
Please call pharmacy and cancel hydrocodone Once confirmed cancelled I will send oxycodone  Thanks!

## 2019-10-24 MED ORDER — OXYCODONE HCL 10 MG PO TABS: 5.0000 mg | ORAL_TABLET | Freq: Four times a day (QID) | ORAL | 0 refills | Status: DC | PRN

## 2019-10-24 NOTE — Telephone Encounter (Signed)
At provider's request contacted [place]. Spoke to pharmacist Minette Brine - hydrocodone-ace rx has been cancelled. Pls send med refill for oxycodone. Thanks.

## 2019-10-24 NOTE — Addendum Note (Signed)
Addended by: Maryla Morrow on: 10/24/2019 11:33 AM   Modules accepted: Orders

## 2019-11-01 DIAGNOSIS — E1140 Type 2 diabetes mellitus with diabetic neuropathy, unspecified: Secondary | ICD-10-CM | POA: Diagnosis not present

## 2019-11-01 DIAGNOSIS — Z794 Long term (current) use of insulin: Secondary | ICD-10-CM | POA: Diagnosis not present

## 2019-11-03 DIAGNOSIS — N179 Acute kidney failure, unspecified: Secondary | ICD-10-CM | POA: Insufficient documentation

## 2019-11-03 DIAGNOSIS — D649 Anemia, unspecified: Secondary | ICD-10-CM | POA: Diagnosis not present

## 2019-11-03 DIAGNOSIS — R531 Weakness: Secondary | ICD-10-CM | POA: Diagnosis not present

## 2019-11-03 DIAGNOSIS — K611 Rectal abscess: Secondary | ICD-10-CM | POA: Diagnosis not present

## 2019-11-03 DIAGNOSIS — E876 Hypokalemia: Secondary | ICD-10-CM | POA: Diagnosis not present

## 2019-11-03 DIAGNOSIS — D6489 Other specified anemias: Secondary | ICD-10-CM | POA: Diagnosis not present

## 2019-11-03 DIAGNOSIS — K8020 Calculus of gallbladder without cholecystitis without obstruction: Secondary | ICD-10-CM | POA: Diagnosis not present

## 2019-11-03 DIAGNOSIS — E871 Hypo-osmolality and hyponatremia: Secondary | ICD-10-CM | POA: Diagnosis not present

## 2019-11-03 DIAGNOSIS — E1151 Type 2 diabetes mellitus with diabetic peripheral angiopathy without gangrene: Secondary | ICD-10-CM | POA: Diagnosis not present

## 2019-11-03 DIAGNOSIS — E11621 Type 2 diabetes mellitus with foot ulcer: Secondary | ICD-10-CM | POA: Diagnosis not present

## 2019-11-03 DIAGNOSIS — R778 Other specified abnormalities of plasma proteins: Secondary | ICD-10-CM | POA: Diagnosis not present

## 2019-11-03 DIAGNOSIS — I739 Peripheral vascular disease, unspecified: Secondary | ICD-10-CM | POA: Diagnosis not present

## 2019-11-03 DIAGNOSIS — L0501 Pilonidal cyst with abscess: Secondary | ICD-10-CM | POA: Diagnosis not present

## 2019-11-03 DIAGNOSIS — N281 Cyst of kidney, acquired: Secondary | ICD-10-CM | POA: Diagnosis not present

## 2019-11-03 DIAGNOSIS — I10 Essential (primary) hypertension: Secondary | ICD-10-CM | POA: Diagnosis not present

## 2019-11-03 DIAGNOSIS — A419 Sepsis, unspecified organism: Secondary | ICD-10-CM | POA: Insufficient documentation

## 2019-11-03 DIAGNOSIS — I4510 Unspecified right bundle-branch block: Secondary | ICD-10-CM | POA: Diagnosis not present

## 2019-11-04 DIAGNOSIS — E876 Hypokalemia: Secondary | ICD-10-CM | POA: Diagnosis not present

## 2019-11-04 DIAGNOSIS — D649 Anemia, unspecified: Secondary | ICD-10-CM | POA: Diagnosis not present

## 2019-11-04 DIAGNOSIS — K611 Rectal abscess: Secondary | ICD-10-CM | POA: Diagnosis not present

## 2019-11-04 DIAGNOSIS — R778 Other specified abnormalities of plasma proteins: Secondary | ICD-10-CM | POA: Diagnosis not present

## 2019-11-04 DIAGNOSIS — I739 Peripheral vascular disease, unspecified: Secondary | ICD-10-CM | POA: Diagnosis not present

## 2019-11-04 DIAGNOSIS — E871 Hypo-osmolality and hyponatremia: Secondary | ICD-10-CM | POA: Diagnosis not present

## 2019-11-04 DIAGNOSIS — E1151 Type 2 diabetes mellitus with diabetic peripheral angiopathy without gangrene: Secondary | ICD-10-CM | POA: Diagnosis not present

## 2019-11-04 DIAGNOSIS — R531 Weakness: Secondary | ICD-10-CM | POA: Diagnosis not present

## 2019-11-04 DIAGNOSIS — N179 Acute kidney failure, unspecified: Secondary | ICD-10-CM | POA: Diagnosis not present

## 2019-11-05 DIAGNOSIS — E876 Hypokalemia: Secondary | ICD-10-CM | POA: Diagnosis not present

## 2019-11-05 DIAGNOSIS — E871 Hypo-osmolality and hyponatremia: Secondary | ICD-10-CM | POA: Diagnosis not present

## 2019-11-05 DIAGNOSIS — D649 Anemia, unspecified: Secondary | ICD-10-CM | POA: Diagnosis not present

## 2019-11-05 DIAGNOSIS — E1151 Type 2 diabetes mellitus with diabetic peripheral angiopathy without gangrene: Secondary | ICD-10-CM | POA: Diagnosis not present

## 2019-11-05 DIAGNOSIS — I739 Peripheral vascular disease, unspecified: Secondary | ICD-10-CM | POA: Diagnosis not present

## 2019-11-05 DIAGNOSIS — R531 Weakness: Secondary | ICD-10-CM | POA: Diagnosis not present

## 2019-11-05 DIAGNOSIS — N179 Acute kidney failure, unspecified: Secondary | ICD-10-CM | POA: Diagnosis not present

## 2019-11-05 DIAGNOSIS — L0501 Pilonidal cyst with abscess: Secondary | ICD-10-CM | POA: Diagnosis not present

## 2019-11-05 DIAGNOSIS — R778 Other specified abnormalities of plasma proteins: Secondary | ICD-10-CM | POA: Diagnosis not present

## 2019-11-25 ENCOUNTER — Ambulatory Visit: Payer: Medicare HMO | Admitting: Osteopathic Medicine

## 2019-11-25 DIAGNOSIS — L0591 Pilonidal cyst without abscess: Secondary | ICD-10-CM | POA: Diagnosis not present

## 2019-11-26 ENCOUNTER — Other Ambulatory Visit: Payer: Self-pay

## 2019-11-26 ENCOUNTER — Ambulatory Visit (INDEPENDENT_AMBULATORY_CARE_PROVIDER_SITE_OTHER): Payer: Medicare HMO | Admitting: Osteopathic Medicine

## 2019-11-26 ENCOUNTER — Encounter: Payer: Self-pay | Admitting: Osteopathic Medicine

## 2019-11-26 VITALS — BP 108/59 | HR 99 | Temp 97.5°F | Wt 158.0 lb

## 2019-11-26 DIAGNOSIS — D649 Anemia, unspecified: Secondary | ICD-10-CM

## 2019-11-26 DIAGNOSIS — E876 Hypokalemia: Secondary | ICD-10-CM | POA: Diagnosis not present

## 2019-11-26 DIAGNOSIS — R202 Paresthesia of skin: Secondary | ICD-10-CM | POA: Diagnosis not present

## 2019-11-26 DIAGNOSIS — N183 Chronic kidney disease, stage 3 unspecified: Secondary | ICD-10-CM

## 2019-11-26 DIAGNOSIS — E118 Type 2 diabetes mellitus with unspecified complications: Secondary | ICD-10-CM

## 2019-11-26 NOTE — Progress Notes (Signed)
Joseph Grimes is a 59 y.o. male who presents to  [place] at [facility]  today, 11/26/19, seeking care for the following: . Potassium concern . Refills      ASSESSMENT & PLAN with other pertinent history/findings:  The primary encounter diagnosis was Anemia, unspecified type. Diagnoses of Paresthesia, Controlled type 2 diabetes mellitus with complication, without long-term current use of insulin ([place]), Stage 3 chronic kidney disease, unspecified whether stage 3a or 3b CKD, and Hypokalemia were also pertinent to this visit.  Patient states potassium was low due to being out of his medication Refill has since been sent and patient states that he has this on hand at home, due for potassium recheck.  Only other complaint is some intermittent numbness in his fingertips.  Labs in [place]: 11/05/2019: Hgb 7.6, MCV 105, Cr 1.98 (basline), Potassium 4.2 11/03/2019: Hemoglobin 9.1, MCV 99, written 2.14, potassium down elevated 2.8 at 03 40 9 AM, up to 3.5 on 11/04/2019 at 4:05 AM and at 11/05/2019 as above at 4.2.  Plan: Labs today to recheck issues as above, ensure that he has refills necessary.   Denies weakness, dizziness, blood in stool or epistaxis.  On exam, cardiac RRR, S1-S2 normal, CTA BL     Orders Placed This Encounter  Procedures  . COMPLETE METABOLIC PANEL WITH GFR  . TSH  . Vitamin B12  . Fe+TIBC+Fer  . Reticulocytes  . CBC (INCLUDES DIFF/PLT) WITH PATHOLOGIST REVIEW  . Urinalysis, Routine w reflex microscopic    No orders of the defined types were placed in this encounter.      Follow-up instructions: Return for RECHECK PENDING RESULTS / IF WORSE OR CHANGE.  Due for follow-up diabetes 01/2020                                         BP (!) 108/59 (BP Location: Right Arm, Patient Position: Sitting, Cuff Size: Normal)   Pulse 99   Temp (!) 97.5 F (36.4 C) (Oral)   Wt 158 lb (71.7 kg)    BMI 23.33 kg/m   Current Meds  Medication Sig  . ACCU-CHEK AVIVA PLUS test strip   . Accu-Chek Softclix Lancets lancets Use to monitor blood glucose 4 time(s) daily  . aspirin 81 MG tablet Take 1 tablet (81 mg total) by mouth daily.  . cilostazol (PLETAL) 100 MG tablet TAKE 1 TABLET TWICE DAILY  . clopidogrel (PLAVIX) 75 MG tablet TAKE 1 TABLET EVERY DAY  . cyanocobalamin ([employer] VITAMIN B12) 1000 MCG tablet Take by mouth.  . ferrous sulfate 325 (65 FE) MG tablet Take 1 tablet (325 mg total) by mouth 2 (two) times daily with a meal.  . furosemide (LASIX) 20 MG tablet TAKE 1 TABLET EVERY DAY  . HYDROcodone-acetaminophen (NORCO/VICODIN) 5-325 MG tablet Take 1-2 tablets by mouth every 8 (eight) hours as needed for moderate pain. #30 must last at least 90 days (until 01/20/20)  . insulin NPH-regular Human (70-30) 100 UNIT/ML injection Inject 30 Units into the skin 2 (two) times daily.   . mupirocin ointment (BACTROBAN) 2 % Place 1 application into the nose 2 (two) times daily.  . ondansetron (ZOFRAN) 4 MG tablet Take 1-2 tablets (4-8 mg total) by mouth every 8 (eight) hours as needed for nausea or vomiting.  . Oxycodone HCl 10 MG TABS Take 0.5-1 tablets (5-10 mg total) by mouth  every 6 (six) hours as needed. Please print on bottle and verbally confirm w/ patient: "#30 pills must last 90 days"  . pantoprazole (PROTONIX) 40 MG tablet TAKE 1 TABLET (40 MG TOTAL) BY MOUTH DAILY.  Marland Kitchen potassium chloride (K-DUR) 10 MEQ tablet Take 1 tablet (10 mEq total) by mouth daily.  . potassium chloride (KLOR-CON) 10 MEQ tablet TAKE 1 TABLET EVERY DAY  . rosuvastatin (CRESTOR) 40 MG tablet TAKE 1 TABLET (40 MG TOTAL) BY MOUTH DAILY.    No results found for this or any previous visit (from the past 72 hour(s)).  No results found.  Depression screen [facility] 2/9 10/11/2018  Decreased Interest 0  Down, Depressed, Hopeless 0  PHQ - 2 Score 0    GAD 7 : Generalized Anxiety Score 10/11/2018  Nervous, Anxious, on Edge 0   Control/stop worrying 0  Worry too much - different things 0  Trouble relaxing 0  Restless 0  Easily annoyed or irritable 0  Afraid - awful might happen 0  Total GAD 7 Score 0      All questions at time of visit were answered - patient instructed to contact office with any additional concerns or updates.  ER/RTC precautions were reviewed with the patient.  Please note: voice recognition software was used to produce this document, and typos may escape review. Please contact Dr. Sheppard Coil for any needed clarifications.

## 2019-11-27 LAB — CBC (INCLUDES DIFF/PLT) WITH PATHOLOGIST REVIEW
Absolute Monocytes: 1172 cells/uL — ABNORMAL HIGH (ref 200–950)
Basophils Absolute: 35 cells/uL (ref 0–200)
Basophils Relative: 0.3 %
Eosinophils Absolute: 278 cells/uL (ref 15–500)
Eosinophils Relative: 2.4 %
HCT: 26.7 % — ABNORMAL LOW (ref 38.5–50.0)
Hemoglobin: 8.5 g/dL — ABNORMAL LOW (ref 13.2–17.1)
Lymphs Abs: 1264 cells/uL (ref 850–3900)
MCH: 32.7 pg (ref 27.0–33.0)
MCHC: 31.8 g/dL — ABNORMAL LOW (ref 32.0–36.0)
MCV: 102.7 fL — ABNORMAL HIGH (ref 80.0–100.0)
MPV: 10.4 fL (ref 7.5–12.5)
Monocytes Relative: 10.1 %
Neutro Abs: 8851 cells/uL — ABNORMAL HIGH (ref 1500–7800)
Neutrophils Relative %: 76.3 %
Platelets: 397 10*3/uL (ref 140–400)
RBC: 2.6 10*6/uL — ABNORMAL LOW (ref 4.20–5.80)
RDW: 12.3 % (ref 11.0–15.0)
Total Lymphocyte: 10.9 %
WBC: 11.6 10*3/uL — ABNORMAL HIGH (ref 3.8–10.8)

## 2019-11-27 LAB — IRON,TIBC AND FERRITIN PANEL
%SAT: 31 % (calc) (ref 20–48)
Ferritin: 45 ng/mL (ref 24–380)
Iron: 71 ug/dL (ref 50–180)
TIBC: 229 mcg/dL (calc) — ABNORMAL LOW (ref 250–425)

## 2019-11-27 LAB — URINALYSIS, ROUTINE W REFLEX MICROSCOPIC
Bacteria, UA: NONE SEEN /HPF
Bilirubin Urine: NEGATIVE
Hyaline Cast: NONE SEEN /LPF
Ketones, ur: NEGATIVE
Leukocytes,Ua: NEGATIVE
Nitrite: NEGATIVE
Specific Gravity, Urine: 1.026 (ref 1.001–1.03)
Squamous Epithelial / HPF: NONE SEEN /HPF (ref <=5)
WBC, UA: NONE SEEN /HPF (ref 0–5)
pH: 6 (ref 5.0–8.0)

## 2019-11-27 LAB — COMPLETE METABOLIC PANEL WITH GFR
AG Ratio: 1.3 (calc) (ref 1.0–2.5)
ALT: 9 U/L (ref 9–46)
AST: 9 U/L — ABNORMAL LOW (ref 10–35)
Albumin: 3.3 g/dL — ABNORMAL LOW (ref 3.6–5.1)
Alkaline phosphatase (APISO): 138 U/L (ref 35–144)
BUN/Creatinine Ratio: 9 (calc) (ref 6–22)
BUN: 17 mg/dL (ref 7–25)
CO2: 23 mmol/L (ref 20–32)
Calcium: 9.1 mg/dL (ref 8.6–10.3)
Chloride: 102 mmol/L (ref 98–110)
Creat: 1.82 mg/dL — ABNORMAL HIGH (ref 0.70–1.25)
GFR, Est African American: 46 mL/min/{1.73_m2} — ABNORMAL LOW (ref >=60)
GFR, Est Non African American: 39 mL/min/{1.73_m2} — ABNORMAL LOW (ref >=60)
Globulin: 2.6 g/dL (calc) (ref 1.9–3.7)
Glucose, Bld: 391 mg/dL — ABNORMAL HIGH (ref 65–99)
Potassium: 3.6 mmol/L (ref 3.5–5.3)
Sodium: 133 mmol/L — ABNORMAL LOW (ref 135–146)
Total Bilirubin: 0.4 mg/dL (ref 0.2–1.2)
Total Protein: 5.9 g/dL — ABNORMAL LOW (ref 6.1–8.1)

## 2019-11-27 LAB — RETICULOCYTES
ABS Retic: 247000 cells/uL — ABNORMAL HIGH (ref 25000–9000)
Retic Ct Pct: 9.5 %

## 2019-11-27 LAB — VITAMIN B12: Vitamin B-12: 952 pg/mL (ref 200–1100)

## 2019-11-27 LAB — TSH: TSH: 1.25 mIU/L (ref 0.40–4.50)

## 2019-11-29 NOTE — Addendum Note (Signed)
Addended by: Maryla Morrow on: 11/29/2019 09:36 AM   Modules accepted: Orders

## 2019-12-03 ENCOUNTER — Telehealth: Payer: Self-pay

## 2019-12-03 NOTE — Telephone Encounter (Signed)
Okay to stop Plavix 7 days prior to surgical procedure

## 2019-12-03 NOTE — Telephone Encounter (Signed)
Levada Dy from Dr. Reyes/[facility] office called requesting authorization regarding pt's current anticoagulation therapy. Pt will need to stop their clopidogrel therapy 7 days prior to having surgical procedure for Pilonidal cystectomy. Pls advise, thanks.

## 2019-12-04 NOTE — Telephone Encounter (Signed)
Left a message advising of recommendations.  

## 2019-12-04 NOTE — Telephone Encounter (Signed)
Levada Dy with Dr Arvin Collard office advised of recommendations.

## 2019-12-04 NOTE — Telephone Encounter (Signed)
Left a detailed vm msg for [name] (Dr. Arvin Collard assistant) regarding provider's note on 12/04/19. Direct contact info was provided.

## 2019-12-05 DIAGNOSIS — E119 Type 2 diabetes mellitus without complications: Secondary | ICD-10-CM | POA: Diagnosis not present

## 2019-12-05 DIAGNOSIS — Z794 Long term (current) use of insulin: Secondary | ICD-10-CM | POA: Diagnosis not present

## 2019-12-11 DIAGNOSIS — N1832 Chronic kidney disease, stage 3b: Secondary | ICD-10-CM | POA: Diagnosis not present

## 2019-12-11 DIAGNOSIS — D649 Anemia, unspecified: Secondary | ICD-10-CM | POA: Diagnosis not present

## 2019-12-23 DIAGNOSIS — E1122 Type 2 diabetes mellitus with diabetic chronic kidney disease: Secondary | ICD-10-CM | POA: Diagnosis not present

## 2019-12-23 DIAGNOSIS — E1142 Type 2 diabetes mellitus with diabetic polyneuropathy: Secondary | ICD-10-CM | POA: Diagnosis not present

## 2019-12-23 DIAGNOSIS — M1990 Unspecified osteoarthritis, unspecified site: Secondary | ICD-10-CM | POA: Diagnosis not present

## 2019-12-23 DIAGNOSIS — N183 Chronic kidney disease, stage 3 unspecified: Secondary | ICD-10-CM | POA: Diagnosis not present

## 2019-12-23 DIAGNOSIS — I129 Hypertensive chronic kidney disease with stage 1 through stage 4 chronic kidney disease, or unspecified chronic kidney disease: Secondary | ICD-10-CM | POA: Diagnosis not present

## 2019-12-23 DIAGNOSIS — Z794 Long term (current) use of insulin: Secondary | ICD-10-CM | POA: Diagnosis not present

## 2019-12-23 DIAGNOSIS — E785 Hyperlipidemia, unspecified: Secondary | ICD-10-CM | POA: Diagnosis not present

## 2019-12-23 DIAGNOSIS — I69951 Hemiplegia and hemiparesis following unspecified cerebrovascular disease affecting right dominant side: Secondary | ICD-10-CM | POA: Diagnosis not present

## 2019-12-23 DIAGNOSIS — E113313 Type 2 diabetes mellitus with moderate nonproliferative diabetic retinopathy with macular edema, bilateral: Secondary | ICD-10-CM | POA: Diagnosis not present

## 2019-12-23 DIAGNOSIS — I69351 Hemiplegia and hemiparesis following cerebral infarction affecting right dominant side: Secondary | ICD-10-CM | POA: Diagnosis not present

## 2019-12-23 DIAGNOSIS — L0591 Pilonidal cyst without abscess: Secondary | ICD-10-CM | POA: Diagnosis not present

## 2019-12-23 DIAGNOSIS — I2510 Atherosclerotic heart disease of native coronary artery without angina pectoris: Secondary | ICD-10-CM | POA: Diagnosis not present

## 2019-12-23 DIAGNOSIS — Z955 Presence of coronary angioplasty implant and graft: Secondary | ICD-10-CM | POA: Diagnosis not present

## 2019-12-23 DIAGNOSIS — N179 Acute kidney failure, unspecified: Secondary | ICD-10-CM | POA: Diagnosis not present

## 2020-01-20 ENCOUNTER — Telehealth: Payer: Self-pay | Admitting: Osteopathic Medicine

## 2020-01-20 NOTE — Telephone Encounter (Signed)
Received fax for PA on Cilostazol sent through cover my meds waiting on determination. - CF

## 2020-01-22 NOTE — Telephone Encounter (Signed)
Received fax from [facility] and they approved coverage on Cilostazol valid until 08/07/2020. - CF

## 2020-01-24 ENCOUNTER — Other Ambulatory Visit: Payer: Self-pay

## 2020-01-24 MED ORDER — FERROUS SULFATE 325 (65 FE) MG PO TABS: 325.0000 mg | ORAL_TABLET | Freq: Two times a day (BID) | ORAL | 1 refills | Status: DC

## 2020-01-29 DIAGNOSIS — E1140 Type 2 diabetes mellitus with diabetic neuropathy, unspecified: Secondary | ICD-10-CM | POA: Diagnosis not present

## 2020-01-29 DIAGNOSIS — Z794 Long term (current) use of insulin: Secondary | ICD-10-CM | POA: Diagnosis not present

## 2020-02-13 DIAGNOSIS — Z794 Long term (current) use of insulin: Secondary | ICD-10-CM | POA: Diagnosis not present

## 2020-02-13 DIAGNOSIS — E1159 Type 2 diabetes mellitus with other circulatory complications: Secondary | ICD-10-CM | POA: Diagnosis not present

## 2020-02-13 DIAGNOSIS — D692 Other nonthrombocytopenic purpura: Secondary | ICD-10-CM | POA: Diagnosis not present

## 2020-02-13 DIAGNOSIS — Z6823 Body mass index (BMI) 23.0-23.9, adult: Secondary | ICD-10-CM | POA: Diagnosis not present

## 2020-02-13 DIAGNOSIS — I2510 Atherosclerotic heart disease of native coronary artery without angina pectoris: Secondary | ICD-10-CM | POA: Diagnosis not present

## 2020-02-13 DIAGNOSIS — F17200 Nicotine dependence, unspecified, uncomplicated: Secondary | ICD-10-CM | POA: Diagnosis not present

## 2020-02-13 DIAGNOSIS — Z7901 Long term (current) use of anticoagulants: Secondary | ICD-10-CM | POA: Diagnosis not present

## 2020-02-13 DIAGNOSIS — D600 Chronic acquired pure red cell aplasia: Secondary | ICD-10-CM | POA: Diagnosis not present

## 2020-02-13 DIAGNOSIS — I252 Old myocardial infarction: Secondary | ICD-10-CM | POA: Diagnosis not present

## 2020-04-21 ENCOUNTER — Other Ambulatory Visit: Payer: Self-pay

## 2020-04-21 DIAGNOSIS — M1990 Unspecified osteoarthritis, unspecified site: Secondary | ICD-10-CM

## 2020-04-21 MED ORDER — OXYCODONE HCL 10 MG PO TABS: 5.0000 mg | ORAL_TABLET | Freq: Four times a day (QID) | ORAL | 0 refills | Status: DC | PRN

## 2020-04-21 NOTE — Telephone Encounter (Signed)
Patient called into office stating he has waited 6 months for Oxycodone. [facility] confirmed the last RX filled and picked up was Feburary 2021 for Hydrocodone. Oxycodone was sent on 10/24/2019 but patient never picked it up from pharmacy. Everything confirmed with pharmacy. They said this RX has expired and a new one will need to be sent. Please Thanks

## 2020-04-27 ENCOUNTER — Telehealth: Payer: Self-pay

## 2020-04-27 NOTE — Telephone Encounter (Signed)
Pharmacy has been updated, patient is to receive 10 pills a month for 3 months. Per Sheppard Coil 30 pills must last 90 days.

## 2020-05-20 DIAGNOSIS — E113293 Type 2 diabetes mellitus with mild nonproliferative diabetic retinopathy without macular edema, bilateral: Secondary | ICD-10-CM | POA: Diagnosis not present

## 2020-05-20 LAB — HM DIABETES EYE EXAM

## 2020-05-28 DIAGNOSIS — Z0289 Encounter for other administrative examinations: Secondary | ICD-10-CM | POA: Diagnosis not present

## 2020-05-30 ENCOUNTER — Other Ambulatory Visit: Payer: Self-pay | Admitting: Osteopathic Medicine

## 2020-06-03 ENCOUNTER — Other Ambulatory Visit: Payer: Self-pay | Admitting: Osteopathic Medicine

## 2020-06-11 DIAGNOSIS — E1165 Type 2 diabetes mellitus with hyperglycemia: Secondary | ICD-10-CM | POA: Diagnosis not present

## 2020-06-11 DIAGNOSIS — Z794 Long term (current) use of insulin: Secondary | ICD-10-CM | POA: Diagnosis not present

## 2020-06-11 DIAGNOSIS — Z6824 Body mass index (BMI) 24.0-24.9, adult: Secondary | ICD-10-CM | POA: Diagnosis not present

## 2020-07-08 ENCOUNTER — Ambulatory Visit: Payer: Medicare HMO | Admitting: Osteopathic Medicine

## 2020-07-13 ENCOUNTER — Telehealth: Payer: Self-pay | Admitting: Osteopathic Medicine

## 2020-07-13 ENCOUNTER — Ambulatory Visit: Payer: Medicare HMO | Admitting: Osteopathic Medicine

## 2020-07-13 NOTE — Telephone Encounter (Signed)
FYI: Patient called first thing this morning to reschedule his appt time to another day

## 2020-07-14 ENCOUNTER — Encounter: Payer: Self-pay | Admitting: Osteopathic Medicine

## 2020-07-14 ENCOUNTER — Ambulatory Visit (INDEPENDENT_AMBULATORY_CARE_PROVIDER_SITE_OTHER): Payer: Medicare HMO | Admitting: Osteopathic Medicine

## 2020-07-14 ENCOUNTER — Other Ambulatory Visit: Payer: Self-pay

## 2020-07-14 VITALS — BP 142/74 | HR 84 | Temp 97.4°F | Wt 138.1 lb

## 2020-07-14 DIAGNOSIS — N5089 Other specified disorders of the male genital organs: Secondary | ICD-10-CM

## 2020-07-14 NOTE — Progress Notes (Signed)
Joseph Grimes is a 59 y.o. male who presents to  [place] at [facility]  today, 07/14/20, seeking care for the following:  . Enlargement in R csrotum, just noted it seems bigger, no pain, no rash, no discoloration, testicle feels normal in the scrotum, no penile discharge, no rectal pain. On exam, normal scrotum/testicles, R side may be slightly larger than on L, no masses      ASSESSMENT & PLAN with other pertinent findings:  The encounter diagnosis was Swollen scrotum.   [place] pending Very low suspicion for torsion Unclear if mass   No results found for this or any previous visit (from the past 24 hour(s)).  There are no Patient Instructions on file for this visit.  Orders Placed This Encounter  Procedures  . US Scrotum    No orders of the defined types were placed in this encounter.      Follow-up instructions: Return for RECHECK PENDING RESULTS / IF WORSE OR CHANGE.                                         BP (!) 142/74 (BP Location: Left Arm, Patient Position: Sitting, Cuff Size: Normal)   Pulse 84   Temp (!) 97.4 F (36.3 C) (Oral)   Wt 138 lb 1.9 oz (62.7 kg)   BMI 20.40 kg/m   Current Meds  Medication Sig  . ACCU-CHEK AVIVA PLUS test strip   . Accu-Chek Softclix Lancets lancets Use to monitor blood glucose 4 time(s) daily  . aspirin 81 MG tablet Take 1 tablet (81 mg total) by mouth daily.  . cilostazol (PLETAL) 100 MG tablet TAKE 1 TABLET TWICE DAILY  . FEROSUL 325 (65 Fe) MG tablet TAKE 1 TABLET TWICE DAILY WITH MEALS  . furosemide (LASIX) 20 MG tablet TAKE 1 TABLET EVERY DAY  . insulin NPH-regular Human (70-30) 100 UNIT/ML injection Inject 30 Units into the skin 2 (two) times daily.   . Oxycodone HCl 10 MG TABS Take 0.5-1 tablets (5-10 mg total) by mouth every 6 (six) hours as needed. Please print on bottle and verbally confirm w/ patient: "#30 pills must last 90 days"  .  pantoprazole (PROTONIX) 40 MG tablet TAKE 1 TABLET BY MOUTH DAILY.  Marland Kitchen potassium chloride (KLOR-CON) 10 MEQ tablet TAKE 1 TABLET EVERY DAY  . rosuvastatin (CRESTOR) 40 MG tablet TAKE 1 TABLET (40 MG TOTAL) BY MOUTH DAILY.    No results found for this or any previous visit (from the past 72 hour(s)).  No results found.     All questions at time of visit were answered - patient instructed to contact office with any additional concerns or updates.  ER/RTC precautions were reviewed with the patient as applicable.   Please note: voice recognition software was used to produce this document, and typos may escape review. Please contact Dr. Sheppard Coil for any needed clarifications.

## 2020-07-23 ENCOUNTER — Ambulatory Visit: Payer: Medicare HMO

## 2020-07-23 ENCOUNTER — Ambulatory Visit (INDEPENDENT_AMBULATORY_CARE_PROVIDER_SITE_OTHER): Payer: Medicare HMO | Admitting: Osteopathic Medicine

## 2020-07-23 DIAGNOSIS — Z0000 Encounter for general adult medical examination without abnormal findings: Secondary | ICD-10-CM | POA: Diagnosis not present

## 2020-07-23 NOTE — Patient Instructions (Signed)
Diabetes Mellitus and Foot Care Foot care is an important part of your health, especially when you have diabetes. Diabetes may cause you to have problems because of poor blood flow (circulation) to your feet and legs, which can cause your skin to:  Become thinner and drier.  Break more easily.  Heal more slowly.  Peel and crack. You may also have nerve damage (neuropathy) in your legs and feet, causing decreased feeling in them. This means that you may not notice minor injuries to your feet that could lead to more serious problems. Noticing and addressing any potential problems early is the best way to prevent future foot problems. How to care for your feet Foot hygiene  Wash your feet daily with warm water and mild soap. Do not use hot water. Then, pat your feet and the areas between your toes until they are completely dry. Do not soak your feet as this can dry your skin.  Trim your toenails straight across. Do not dig under them or around the cuticle. File the edges of your nails with an emery board or nail file.  Apply a moisturizing lotion or petroleum jelly to the skin on your feet and to dry, brittle toenails. Use lotion that does not contain alcohol and is unscented. Do not apply lotion between your toes. Shoes and socks  Wear clean socks or stockings every day. Make sure they are not too tight. Do not wear knee-high stockings since they may decrease blood flow to your legs.  Wear shoes that fit properly and have enough cushioning. Always look in your shoes before you put them on to be sure there are no objects inside.  To break in new shoes, wear them for just a few hours a day. This prevents injuries on your feet. Wounds, scrapes, corns, and calluses  Check your feet daily for blisters, cuts, bruises, sores, and redness. If you cannot see the bottom of your feet, use a mirror or ask someone for help.  Do not cut corns or calluses or try to remove them with medicine.  If  you find a minor scrape, cut, or break in the skin on your feet, keep it and the skin around it clean and dry. You may clean these areas with mild soap and water. Do not clean the area with peroxide, alcohol, or iodine.  If you have a wound, scrape, corn, or callus on your foot, look at it several times a day to make sure it is healing and not infected. Check for: ? Redness, swelling, or pain. ? Fluid or blood. ? Warmth. ? Pus or a bad smell. General instructions  Do not cross your legs. This may decrease blood flow to your feet.  Do not use heating pads or hot water bottles on your feet. They may burn your skin. If you have lost feeling in your feet or legs, you may not know this is happening until it is too late.  Protect your feet from hot and cold by wearing shoes, such as at the beach or on hot pavement.  Schedule a complete foot exam at least once a year (annually) or more often if you have foot problems. If you have foot problems, report any cuts, sores, or bruises to your health care provider immediately. Contact a health care provider if:  You have a medical condition that increases your risk of infection and you have any cuts, sores, or bruises on your feet.  You have an injury that is  not healing.  You have redness on your legs or feet.  You feel burning or tingling in your legs or feet.  You have pain or cramps in your legs and feet.  Your legs or feet are numb.  Your feet always feel cold.  You have pain around a toenail. Get help right away if:  You have a wound, scrape, corn, or callus on your foot and: ? You have pain, swelling, or redness that gets worse. ? You have fluid or blood coming from the wound, scrape, corn, or callus. ? Your wound, scrape, corn, or callus feels warm to the touch. ? You have pus or a bad smell coming from the wound, scrape, corn, or callus. ? You have a fever. ? You have a red line going up your leg. Summary  Check your feet every  day for cuts, sores, red spots, swelling, and blisters.  Moisturize feet and legs daily.  Wear shoes that fit properly and have enough cushioning.  If you have foot problems, report any cuts, sores, or bruises to your health care provider immediately.  Schedule a complete foot exam at least once a year (annually) or more often if you have foot problems. This information is not intended to replace advice given to you by your health care provider. Make sure you discuss any questions you have with your health care provider. Document Revised: 04/17/2019 Document Reviewed: 08/26/2016 Elsevier Patient Education  [place] Prevention in the Home, Adult Falls can cause injuries. They can happen to people of all ages. There are many things you can do to make your home safe and to help prevent falls. Ask for help when making these changes, if needed. What actions can I take to prevent falls? General Instructions  Use good lighting in all rooms. Replace any light bulbs that burn out.  Turn on the lights when you go into a dark area. Use night-lights.  Keep items that you use often in easy-to-reach places. Lower the shelves around your home if necessary.  Set up your furniture so you have a clear path. Avoid moving your furniture around.  Do not have throw rugs and other things on the floor that can make you trip.  Avoid walking on wet floors.  If any of your floors are uneven, fix them.  Add color or contrast paint or tape to clearly mark and help you see: ? Any grab bars or handrails. ? First and last steps of stairways. ? Where the edge of each step is.  If you use a stepladder: ? Make sure that it is fully opened. Do not climb a closed stepladder. ? Make sure that both sides of the stepladder are locked into place. ? Ask someone to hold the stepladder for you while you use it.  If there are any pets around you, be aware of where they are. What can I do in the  bathroom?      Keep the floor dry. Clean up any water that spills onto the floor as soon as it happens.  Remove soap buildup in the tub or shower regularly.  Use non-skid mats or decals on the floor of the tub or shower.  Attach bath mats securely with double-sided, non-slip rug tape.  If you need to sit down in the shower, use a plastic, non-slip stool.  Install grab bars by the toilet and in the tub and shower. Do not use towel bars as grab bars. What can  I do in the bedroom?  Make sure that you have a light by your bed that is easy to reach.  Do not use any sheets or blankets that are too big for your bed. They should not hang down onto the floor.  Have a firm chair that has side arms. You can use this for support while you get dressed. What can I do in the kitchen?  Clean up any spills right away.  If you need to reach something above you, use a strong step stool that has a grab bar.  Keep electrical cords out of the way.  Do not use floor polish or wax that makes floors slippery. If you must use wax, use non-skid floor wax. What can I do with my stairs?  Do not leave any items on the stairs.  Make sure that you have a light switch at the top of the stairs and the bottom of the stairs. If you do not have them, ask someone to add them for you.  Make sure that there are handrails on both sides of the stairs, and use them. Fix handrails that are broken or loose. Make sure that handrails are as long as the stairways.  Install non-slip stair treads on all stairs in your home.  Avoid having throw rugs at the top or bottom of the stairs. If you do have throw rugs, attach them to the floor with carpet tape.  Choose a carpet that does not hide the edge of the steps on the stairway.  Check any carpeting to make sure that it is firmly attached to the stairs. Fix any carpet that is loose or worn. What can I do on the outside of my home?  Use bright outdoor  lighting.  Regularly fix the edges of walkways and driveways and fix any cracks.  Remove anything that might make you trip as you walk through a door, such as a raised step or threshold.  Trim any bushes or trees on the path to your home.  Regularly check to see if handrails are loose or broken. Make sure that both sides of any steps have handrails.  Install guardrails along the edges of any raised decks and porches.  Clear walking paths of anything that might make someone trip, such as tools or rocks.  Have any leaves, snow, or ice cleared regularly.  Use sand or salt on walking paths during winter.  Clean up any spills in your garage right away. This includes grease or oil spills. What other actions can I take?  Wear shoes that: ? Have a low heel. Do not wear high heels. ? Have rubber bottoms. ? Are comfortable and fit you well. ? Are closed at the toe. Do not wear open-toe sandals.  Use tools that help you move around (mobility aids) if they are needed. These include: ? Canes. ? Walkers. ? Scooters. ? Crutches.  Review your medicines with your doctor. Some medicines can make you feel dizzy. This can increase your chance of falling. Ask your doctor what other things you can do to help prevent falls. Where to find more information  Centers for Disease Control and Prevention, STEADI: https://garcia.biz/  [employer] on Aging: BrainJudge.co.uk Contact a doctor if:  You are afraid of falling at home.  You feel weak, drowsy, or dizzy at home.  You fall at home. Summary  There are many simple things that you can do to make your home safe and to help prevent falls.  Ways  to make your home safe include removing tripping hazards and installing grab bars in the bathroom.  Ask for help when making these changes in your home. This information is not intended to replace advice given to you by your health care provider. Make sure you discuss any questions you  have with your health care provider. Document Revised: 11/15/2018 Document Reviewed: 03/09/2017 Elsevier Patient Education  2020 [place] Maintenance, Male Adopting a healthy lifestyle and getting preventive care are important in promoting health and wellness. Ask your health care provider about:  The right schedule for you to have regular tests and exams.  Things you can do on your own to prevent diseases and keep yourself healthy. What should I know about diet, weight, and exercise? Eat a healthy diet   Eat a diet that includes plenty of vegetables, fruits, low-fat dairy products, and lean protein.  Do not eat a lot of foods that are high in solid fats, added sugars, or sodium. Maintain a healthy weight Body mass index (BMI) is a measurement that can be used to identify possible weight problems. It estimates body fat based on height and weight. Your health care provider can help determine your BMI and help you achieve or maintain a healthy weight. Get regular exercise Get regular exercise. This is one of the most important things you can do for your health. Most adults should:  Exercise for at least 150 minutes each week. The exercise should increase your heart rate and make you sweat (moderate-intensity exercise).  Do strengthening exercises at least twice a week. This is in addition to the moderate-intensity exercise.  Spend less time sitting. Even light physical activity can be beneficial. Watch cholesterol and blood lipids Have your blood tested for lipids and cholesterol at 61 years of age, then have this test every 5 years. You may need to have your cholesterol levels checked more often if:  Your lipid or cholesterol levels are high.  You are older than 61 years of age.  You are at high risk for heart disease. What should I know about cancer screening? Many types of cancers can be detected early and may often be prevented. Depending on your health history  and family history, you may need to have cancer screening at various ages. This may include screening for:  Colorectal cancer.  Prostate cancer.  Skin cancer.  Lung cancer. What should I know about heart disease, diabetes, and high blood pressure? Blood pressure and heart disease  High blood pressure causes heart disease and increases the risk of stroke. This is more likely to develop in people who have high blood pressure readings, are of African descent, or are overweight.  Talk with your health care provider about your target blood pressure readings.  Have your blood pressure checked: ? Every 3-5 years if you are 52-31 years of age. ? Every year if you are 57 years old or older.  If you are between the ages of 64 and 27 and are a current or former smoker, ask your health care provider if you should have a one-time screening for abdominal aortic aneurysm (AAA). Diabetes Have regular diabetes screenings. This checks your fasting blood sugar level. Have the screening done:  Once every three years after age 20 if you are at a normal weight and have a low risk for diabetes.  More often and at a younger age if you are overweight or have a high risk for diabetes. What should I know about  preventing infection? Hepatitis B If you have a higher risk for hepatitis B, you should be screened for this virus. Talk with your health care provider to find out if you are at risk for hepatitis B infection. Hepatitis C Blood testing is recommended for:  Everyone born from [birth year] through 1967.  Anyone with known risk factors for hepatitis C. Sexually transmitted infections (STIs)  You should be screened each year for STIs, including gonorrhea and chlamydia, if: ? You are sexually active and are younger than 61 years of age. ? You are older than 61 years of age and your health care provider tells you that you are at risk for this type of infection. ? Your sexual activity has changed since you were  last screened, and you are at increased risk for chlamydia or gonorrhea. Ask your health care provider if you are at risk.  Ask your health care provider about whether you are at high risk for HIV. Your health care provider may recommend a prescription medicine to help prevent HIV infection. If you choose to take medicine to prevent HIV, you should first get tested for HIV. You should then be tested every 3 months for as long as you are taking the medicine. Follow these instructions at home: Lifestyle  Do not use any products that contain nicotine or tobacco, such as cigarettes, e-cigarettes, and chewing tobacco. If you need help quitting, ask your health care provider.  Do not use street drugs.  Do not share needles.  Ask your health care provider for help if you need support or information about quitting drugs. Alcohol use  Do not drink alcohol if your health care provider tells you not to drink.  If you drink alcohol: ? Limit how much you have to 0-2 drinks a day. ? Be aware of how much alcohol is in your drink. In the [place] one drink equals one 12 oz bottle of beer (355 mL), one 5 oz glass of wine (148 mL), or one 1 oz glass of hard liquor (44 mL). General instructions  Schedule regular health, dental, and eye exams.  Stay current with your vaccines.  Tell your health care provider if: ? You often feel depressed. ? You have ever been abused or do not feel safe at home. Summary  Adopting a healthy lifestyle and getting preventive care are important in promoting health and wellness.  Follow your health care provider's instructions about healthy diet, exercising, and getting tested or screened for diseases.  Follow your health care provider's instructions on monitoring your cholesterol and blood pressure. This information is not intended to replace advice given to you by your health care provider. Make sure you discuss any questions you have with your health care  provider. Document Revised: 07/18/2018 Document Reviewed: 07/18/2018 Elsevier Patient Education  2020 [employer].   Steps to Quit Smoking Smoking tobacco is the leading cause of preventable death. It can affect almost every organ in the body. Smoking puts you and people around you at risk for many serious, long-lasting (chronic) diseases. Quitting smoking can be hard, but it is one of the best things that you can do for your health. It is never too late to quit. How do I get ready to quit? When you decide to quit smoking, make a plan to help you succeed. Before you quit:  Pick a date to quit. Set a date within the next 2 weeks to give you time to prepare.  Write down the reasons why you are  quitting. Keep this list in places where you will see it often.  Tell your family, friends, and co-workers that you are quitting. Their support is important.  Talk with your doctor about the choices that may help you quit.  Find out if your health insurance will pay for these treatments.  Know the people, places, things, and activities that make you want to smoke (triggers). Avoid them. What first steps can I take to quit smoking?  Throw away all cigarettes at home, at work, and in your car.  Throw away the things that you use when you smoke, such as ashtrays and lighters.  Clean your car. Make sure to empty the ashtray.  Clean your home, including curtains and carpets. What can I do to help me quit smoking? Talk with your doctor about taking medicines and seeing a counselor at the same time. You are more likely to succeed when you do both.  If you are pregnant or breastfeeding, talk with your doctor about counseling or other ways to quit smoking. Do not take medicine to help you quit smoking unless your doctor tells you to do so. To quit smoking: Quit right away  Quit smoking totally, instead of slowly cutting back on how much you smoke over a period of time.  Go to counseling. You are  more likely to quit if you go to counseling sessions regularly. Take medicine You may take medicines to help you quit. Some medicines need a prescription, and some you can buy over-the-counter. Some medicines may contain a drug called nicotine to replace the nicotine in cigarettes. Medicines may:  Help you to stop having the desire to smoke (cravings).  Help to stop the problems that come when you stop smoking (withdrawal symptoms). Your doctor may ask you to use:  Nicotine patches, gum, or lozenges.  Nicotine inhalers or sprays.  Non-nicotine medicine that is taken by mouth. Find resources Find resources and other ways to help you quit smoking and remain smoke-free after you quit. These resources are most helpful when you use them often. They include:  Online chats with a Social worker.  Phone quitlines.  Printed Furniture conservator/restorer.  Support groups or group counseling.  Text messaging programs.  Mobile phone apps. Use apps on your mobile phone or tablet that can help you stick to your quit plan. There are many free apps for mobile phones and tablets as well as websites. Examples include Quit Guide from the [employer] and smokefree.gov  What things can I do to make it easier to quit?   Talk to your family and friends. Ask them to support and encourage you.  Call a phone quitline (1-800-QUIT-NOW), reach out to support groups, or work with a Social worker.  Ask people who smoke to not smoke around you.  Avoid places that make you want to smoke, such as: ? Bars. ? Parties. ? Smoke-break areas at work.  Spend time with people who do not smoke.  Lower the stress in your life. Stress can make you want to smoke. Try these things to help your stress: ? Getting regular exercise. ? Doing deep-breathing exercises. ? Doing yoga. ? Meditating. ? Doing a body scan. To do this, close your eyes, focus on one area of your body at a time from head to toe. Notice which parts of your body are tense.  Try to relax the muscles in those areas. How will I feel when I quit smoking? Day 1 to 3 weeks Within the first 24 hours, you  may start to have some problems that come from quitting tobacco. These problems are very bad 2-3 days after you quit, but they do not often last for more than 2-3 weeks. You may get these symptoms:  Mood swings.  Feeling restless, nervous, angry, or annoyed.  Trouble concentrating.  Dizziness.  Strong desire for high-sugar foods and nicotine.  Weight gain.  Trouble pooping (constipation).  Feeling like you may vomit (nausea).  Coughing or a sore throat.  Changes in how the medicines that you take for other issues work in your body.  Depression.  Trouble sleeping (insomnia). Week 3 and afterward After the first 2-3 weeks of quitting, you may start to notice more positive results, such as:  Better sense of smell and taste.  Less coughing and sore throat.  Slower heart rate.  Lower blood pressure.  Clearer skin.  Better breathing.  Fewer sick days. Quitting smoking can be hard. Do not give up if you fail the first time. Some people need to try a few times before they succeed. Do your best to stick to your quit plan, and talk with your doctor if you have any questions or concerns. Summary  Smoking tobacco is the leading cause of preventable death. Quitting smoking can be hard, but it is one of the best things that you can do for your health.  When you decide to quit smoking, make a plan to help you succeed.  Quit smoking right away, not slowly over a period of time.  When you start quitting, seek help from your doctor, family, or friends. This information is not intended to replace advice given to you by your health care provider. Make sure you discuss any questions you have with your health care provider. Document Revised: 04/19/2019 Document Reviewed: 10/13/2018 Elsevier Patient Education  2020 [place] you for enrolling in  [place]. Please follow the instructions below to securely access your online medical record. MyChart allows you to send messages to your doctor, view your test results, manage appointments, and more.   How Do I Sign Up? 1. In your Internet browser, go to [employer] and enter [email or web address]. 2. Click on the Sign Up Now link in the Sign In box. You will see the New Member Sign Up page. 3. Enter your MyChart Access Code exactly as it appears below. You will not need to use this code after you've completed the sign-up process. If you do not sign up before the expiration date, you must request a new code.  MyChart Access Code: [email or web address] Expires: 08/28/2020 10:47 AM  4. Enter your Social Security Number ([email or web address]) and Date of Birth (mm/dd/yyyy) as indicated and click Submit. You will be taken to the next sign-up page. 5. Create a MyChart ID. This will be your MyChart login ID and cannot be changed, so think of one that is secure and easy to remember. 6. Create a MyChart password. You can change your password at any time. 7. Enter your Password Reset Question and Answer. This can be used at a later time if you forget your password.  8. Enter your e-mail address. You will receive e-mail notification when new information is available in [place]. 9. Click Sign Up. You can now view your medical record.   Additional Information Remember, MyChart is NOT to be used for urgent needs. For medical emergencies, dial 911.

## 2020-07-23 NOTE — Progress Notes (Signed)
MEDICARE ANNUAL WELLNESS VISIT  07/23/2020  Telephone Visit Disclaimer This Medicare AWV was conducted by telephone due to national recommendations for restrictions regarding the COVID-19 Pandemic (e.g. social distancing).  I verified, using two identifiers, that I am speaking with Joseph Grimes or their authorized healthcare agent. I discussed the limitations, risks, security, and privacy concerns of performing an evaluation and management service by telephone and the potential availability of an in-person appointment in the future. The patient expressed understanding and agreed to proceed.  Location of Patient: Home Location of Provider (nurse):  [place], In the office.    Subjective:   Joseph Grimes is a 59 y.o. male who presents for Medicare Annual/Subsequent preventive examination.  Review of Systems    Cardiac Risk Factors include: advanced age (>25men, >17 women);diabetes mellitus;family history of premature cardiovascular disease;hypertension;male gender;sedentary lifestyle;smoking/ tobacco exposure     Objective:    There were no vitals filed for this visit. There is no height or weight on file to calculate BMI.  Advanced Directives 07/23/2020 07/19/2019 07/12/2019  Does Patient Have a Medical Advance Directive? No No Yes  Type of Advance Directive - - Living will;Healthcare Power of Attorney  Does patient want to make changes to medical advance directive? - No - Patient declined No - Patient declined  Would patient like information on creating a medical advance directive? No - Patient declined No - Patient declined -    Current Medications (verified) Outpatient Encounter Medications as of 07/23/2020  Medication Sig   ACCU-CHEK AVIVA PLUS test strip    Accu-Chek Softclix Lancets lancets Use to monitor blood glucose 4 time(s) daily   aspirin 81 MG EC tablet Take by mouth.   aspirin 81 MG tablet Take 1 tablet (81 mg total) by mouth daily.    cilostazol (PLETAL) 100 MG tablet TAKE 1 TABLET TWICE DAILY   cyanocobalamin 1000 MCG tablet Take by mouth.   FEROSUL 325 (65 Fe) MG tablet TAKE 1 TABLET TWICE DAILY WITH MEALS   furosemide (LASIX) 20 MG tablet TAKE 1 TABLET EVERY DAY   insulin NPH-regular Human (70-30) 100 UNIT/ML injection Inject 30 Units into the skin 2 (two) times daily.    ondansetron (ZOFRAN) 4 MG tablet Take 1-2 tablets (4-8 mg total) by mouth every 8 (eight) hours as needed for nausea or vomiting.   Oxycodone HCl 10 MG TABS Take 0.5-1 tablets (5-10 mg total) by mouth every 6 (six) hours as needed. Please print on bottle and verbally confirm w/ patient: "#30 pills must last 90 days"   pantoprazole (PROTONIX) 40 MG tablet TAKE 1 TABLET BY MOUTH DAILY.   pantoprazole (PROTONIX) 40 MG tablet Take by mouth.   potassium chloride (KLOR-CON) 10 MEQ tablet TAKE 1 TABLET EVERY DAY   potassium chloride (KLOR-CON) 10 MEQ tablet Take by mouth.   rosuvastatin (CRESTOR) 40 MG tablet TAKE 1 TABLET (40 MG TOTAL) BY MOUTH DAILY.   potassium chloride (K-DUR) 10 MEQ tablet Take 1 tablet (10 mEq total) by mouth daily.   [DISCONTINUED] clopidogrel (PLAVIX) 75 MG tablet TAKE 1 TABLET EVERY DAY (Patient not taking: Reported on 07/14/2020)   [DISCONTINUED] mupirocin ointment (BACTROBAN) 2 % Place 1 application into the nose 2 (two) times daily. (Patient not taking: Reported on 07/14/2020)   No facility-administered encounter medications on file as of 07/23/2020.    Allergies (verified) Patient has no known allergies.   History: Past Medical History:  Diagnosis Date   CKD (chronic kidney disease) stage 3,  GFR 30-59 ml/min (HCC) 10/11/2018   Coronary artery disease 10/11/2018   Diabetes ([place])    Diabetes mellitus type 2, controlled, with complications ([place]) 04/10/2670   Essential hypertension 10/11/2018   GERD (gastroesophageal reflux disease)    Heart attack ([place]) 2006   stent placed in right coronary artery   High  cholesterol    History of anemia 10/11/2018   History of low potassium    Low hemoglobin    Mixed hyperlipidemia 10/11/2018   Peripheral vascular disease ([place]) 10/11/2018   Stroke ([place])    Tobacco dependence 10/11/2018   Past Surgical History:  Procedure Laterality Date   AMPUTATION Left 07/19/2019   Procedure: LEFT PARTIAL THUMB AMPUTATION;  Surgeon: Leandrew Koyanagi, MD;  Location: [place];  Service: Orthopedics;  Laterality: Left;  with digital block   CARDIAC CATHETERIZATION  2006   04/18/18: no stents, stent in '06   FEMORAL-POPLITEAL BYPASS GRAFT Right 05/12/2017   HAND SURGERY     KNEE ARTHROSCOPY     bilat   TOE AMPUTATION     x2   TONSILLECTOMY     Family History  Problem Relation Age of Onset   Diabetes Sister    Diabetes Brother    Heart disease Mother    Heart attack Father    Diabetes Father    Diabetes Sister    Social History   Socioeconomic History   Marital status: Widowed    Spouse name: Not on file   Number of children: 7   Years of education: Not on file   Highest education level: Associate degree: academic program  Occupational History   Occupation: Retired; Disabled.  Tobacco Use   Smoking status: Current Every Day Smoker    Packs/day: 0.50   Smokeless tobacco: Never Used   Tobacco comment: refused  Vaping Use   Vaping Use: Never used  Substance and Sexual Activity   Alcohol use: Not Currently   Drug use: Never   Sexual activity: Not Currently    Partners: Female  Other Topics Concern   Not on file  Social History Narrative   Lives with daughter and 2 grand children. Enjoys spending time with the grand children and watching TV.   Social Determinants of Health   Financial Resource Strain: Low Risk    Difficulty of Paying Living Expenses: Not very hard  Food Insecurity: No Food Insecurity   Worried About Charity fundraiser in the Last Year: Never true   Ran Out of Food in the Last Year:  Never true  Transportation Needs: No Transportation Needs   Lack of Transportation (Medical): No   Lack of Transportation (Non-Medical): No  Physical Activity: Inactive   Days of Exercise per Week: 0 days   Minutes of Exercise per Session: 0 min  Stress: No Stress Concern Present   Feeling of Stress : Not at all  Social Connections: Socially Isolated   Frequency of Communication with Friends and Family: More than three times a week   Frequency of Social Gatherings with Friends and Family: More than three times a week   Attends Religious Services: Never   Marine scientist or Organizations: No   Attends Archivist Meetings: Never   Marital Status: Widowed    Tobacco Counseling Ready to quit: No Counseling given: No Comment: refused   Clinical Intake:  Pre-visit preparation completed: Yes  Pain : No/denies pain     BMI - recorded: 20.4 Nutritional Status: BMI of 19-24  Normal Nutritional Risks: None Diabetes: Yes CBG done?: No Did pt. bring in CBG monitor from home?: No  How often do you need to have someone help you when you read instructions, pamphlets, or other written materials from your doctor or pharmacy?: 1 - Never What is the last grade level you completed in school?: Associates degree  Diabetic?yes  Interpreter Needed?: No  Information entered by :: Tinnie Gens, RN   Activities of Daily Living In your present state of health, do you have any difficulty performing the following activities: 07/23/2020  Hearing? N  Vision? N  Difficulty concentrating or making decisions? N  Walking or climbing stairs? Y  Dressing or bathing? N  Doing errands, shopping? N  Preparing Food and eating ? N  Using the Toilet? N  In the past six months, have you accidently leaked urine? N  Do you have problems with loss of bowel control? N  Managing your Medications? N  Managing your Finances? N  Housekeeping or managing your Housekeeping? N   Some recent data might be hidden    Patient Care Team: Emeterio Reeve, DO as PCP - General (Osteopathic Medicine)  Indicate any recent Medical Services you may have received from other than [facility] providers in the past year (date may be approximate).     Assessment:   This is a routine wellness examination for [place].  Hearing/Vision screen No exam data present  Dietary issues and exercise activities discussed: Current Exercise Habits: The patient does not participate in regular exercise at present, Exercise limited by: None identified  Goals     Patient Stated     07/23/2020 AWV Goal: Diabetes Management   Patient will maintain an A1C level below 8.0  Patient will not develop any diabetic foot complications  Patient will not experience any hypoglycemic episodes over the next 3 months  Patient will notify our office of any CBG readings outside of the provider recommended range by calling [phone]  Patient will adhere to provider recommendations for diabetes management  Patient Self Management Activities  take all medications as prescribed and report any negative side effects  monitor and record blood sugar readings as directed  adhere to a low carbohydrate diet that incorporates lean proteins, vegetables, whole grains, low glycemic fruits  check feet daily noting any sores, cracks, injuries, or callous formations  see PCP or podiatrist if he notices any changes in his legs, feet, or toenails  Patient will visit PCP and have an A1C level checked every 3 to 6 months as directed   have a yearly eye exam to monitor for vascular changes associated with diabetes and will request that the report be sent to his pcp.   consult with his PCP regarding any changes in his health or new or worsening symptoms       Depression Screen PHQ 2/9 Scores 07/23/2020 10/11/2018  PHQ - 2 Score 0 0    Fall Risk Fall Risk  07/23/2020 06/24/2019  Falls in the past year? 0 0   Number falls in past yr: 0 0  Injury with Fall? 0 0  Risk for fall due to : No Fall Risks -  Follow up Falls evaluation completed -    FALL RISK PREVENTION PERTAINING TO THE HOME:  Any stairs in or around the home? No  Home free of loose throw rugs in walkways, pet beds, electrical cords, etc? Yes Adequate lighting in your home to reduce risk of falls? Yes   Cognitive Function:  6CIT Screen 07/23/2020  What Year? 0 points  What month? 0 points  What time? 0 points  Count back from 20 0 points  Months in reverse 0 points  Repeat phrase 0 points  Total Score 0    Immunizations Immunization History  Administered Date(s) Administered   Janssen (J&J) SARS-COV-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 12/04/2019   PFIZER SARS-COV-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 06/10/2020    TDAP status: Up to date  Flu Vaccine status: Declined, Education has been provided regarding the importance of this vaccine but patient still declined. Advised may receive this vaccine at local pharmacy or Health Dept. Aware to provide a copy of the vaccination record if obtained from local pharmacy or Health Dept. Verbalized acceptance and understanding.  Pneumococcal vaccine status: Declined,  Education has been provided regarding the importance of this vaccine but patient still declined. Advised may receive this vaccine at local pharmacy or Health Dept. Aware to provide a copy of the vaccination record if obtained from local pharmacy or Health Dept. Verbalized acceptance and understanding.   Covid-19 vaccine status: Completed vaccines  Qualifies for Shingles Vaccine? Yes   Zostavax completed No   Shingrix Completed?: No.    Education has been provided regarding the importance of this vaccine. Patient has been advised to call insurance company to determine out of pocket expense if they have not yet received this vaccine. Advised may also receive vaccine at local pharmacy or Health Dept. Verbalized acceptance and understanding.  Screening  Tests Health Maintenance  Topic Date Due   FOOT EXAM  10/06/2020 (Originally 12/27/2019)   HEMOGLOBIN A1C  10/06/2020 (Originally 04/23/2020)   Hepatitis C Screening  10/06/2020 (Originally 01-Jul-1961)   HIV Screening  10/06/2020 (Originally 07/28/1974)   TETANUS/TDAP  10/21/2020 (Originally 07/28/1978)   INFLUENZA VACCINE  11/05/2020 (Originally 03/08/2020)   PNEUMOCOCCAL POLYSACCHARIDE VACCINE AGE 77-64 HIGH RISK  07/23/2021 (Originally 07/28/1961)   URINE MICROALBUMIN  10/21/2020   OPHTHALMOLOGY EXAM  05/08/2021   COLONOSCOPY  07/19/2027   COVID-19 Vaccine  Completed    Health Maintenance  There are no preventive care reminders to display for this patient.  Colorectal cancer screening: Type of screening: Colonoscopy. Completed 2018. Repeat every 10 years  Lung Cancer Screening: (Low Dose CT Chest recommended if Age 31-80 years, 30 pack-year currently smoking OR have quit w/in 15years.) does qualify.   Lung Cancer Screening Referral: patient declined  Additional Screening:  Hepatitis C Screening: does qualify; Completed Patient will complete at next office visit.  Vision Screening: Recommended annual ophthalmology exams for early detection of glaucoma and other disorders of the eye. Is the patient up to date with their annual eye exam?  Yes  Who is the provider or what is the name of the office in which the patient attends annual eye exams? [facility] If pt is not established with a provider, would they like to be referred to a provider to establish care? No .   Dental Screening: Recommended annual dental exams for proper oral hygiene  Community Resource Referral / Chronic Care Management: CRR required this visit?  [facility] referral   CCM required this visit?  [facility] referral     Plan:     I have personally reviewed and noted the following in the patients chart:    Medical and social history  Use of alcohol, tobacco or illicit drugs   Current  medications and supplements  Functional ability and status  Nutritional status  Physical activity  Advanced directives  List of other physicians  Hospitalizations, surgeries, and  ER visits in previous 12 months  Vitals  Screenings to include cognitive, depression, and falls  Referrals and appointments  In addition, I have reviewed and discussed with patient certain preventive protocols, quality metrics, and best practice recommendations. A written personalized care plan for preventive services as well as general preventive health recommendations were provided to patient.     Tinnie Gens, RN   07/23/2020   Nurse Notes: Patient stated that he has not had his insulin for 3 months and has stopped checking his blood sugar. Educated the patient on importance of checking his blood sugar daily; signs and symptoms of hyperglycemia and hypoglycemia. Dr. Sheppard Coil notified regarding the insulin and [facility] referral has been completed.

## 2020-07-30 ENCOUNTER — Other Ambulatory Visit: Payer: Self-pay

## 2020-07-30 NOTE — Patient Outreach (Signed)
[facility]) Care Management  07/30/2020  CRISTOFHER LIVECCHI 11-Apr-1961 [id number]  Referral for medication assistance from Jon Billings, RN sent to [place].  Ina Homes [facility] Management Assistant [phone]

## 2020-07-30 NOTE — Patient Outreach (Signed)
[facility]) Care Management  07/30/2020  Joseph Grimes 1961-05-08 [id number]   Referral Date: 07/23/20 Referral Source: Md referral Referral Reason: Cannot afford insulin   Outreach Attempt: spoke with patient.  Addressed reason for referral. Explained [facility] pharmacy assistance.  Patient agreeable but hesitant about referral. Patient reports his insulin costing $25-30 per month and he does not take it as he cannot afford it.  Discussed importance of needing insulin for his health.  He verbalized understanding.    Social: Patient lives in the home with his daughter but he is independent with his care.    Conditions: Patient admits to diabetes, previous heart attack and stroke.  Patient declined disease management support   Medications: Patient unable to afford insulin but takes other medications.    Appointments: Patient last saw primary care on 07-23-20.   Advanced Directives: Patient does not have an advanced directive and declined assistance.    Consent: Discussed [facility] services and support. Patient only agreeable to pharmacy only.     Plan: RN CM will refer to pharmacy and close CM part of case.    Jone Baseman, RN, MSN [facility] Care Management Care Management Coordinator Direct Line [phone] Toll Free: [phone]  Fax: [phone]

## 2020-08-08 ENCOUNTER — Other Ambulatory Visit: Payer: Self-pay | Admitting: Osteopathic Medicine

## 2020-08-11 ENCOUNTER — Other Ambulatory Visit: Payer: Medicare HMO

## 2020-08-13 ENCOUNTER — Other Ambulatory Visit: Payer: Medicare HMO

## 2020-08-20 ENCOUNTER — Other Ambulatory Visit: Payer: Self-pay

## 2020-08-20 DIAGNOSIS — M1990 Unspecified osteoarthritis, unspecified site: Secondary | ICD-10-CM

## 2020-08-20 NOTE — Telephone Encounter (Signed)
Pt called requesting a med refill for oxycodone. Rx pended.

## 2020-08-21 MED ORDER — OXYCODONE HCL 10 MG PO TABS: 5.0000 mg | ORAL_TABLET | Freq: Four times a day (QID) | ORAL | 0 refills | Status: DC | PRN

## 2020-08-21 NOTE — Telephone Encounter (Signed)
Pt left a second vm msg today regarding med refill. Routing to covering provider. Thanks in advance.

## 2020-08-21 NOTE — Telephone Encounter (Signed)
Task completed. Left a detailed vm msg for pt regarding oxycodone refill sent to the pharmacy. Direct call back info provided.

## 2020-08-24 ENCOUNTER — Encounter: Payer: Self-pay | Admitting: Osteopathic Medicine

## 2020-08-25 ENCOUNTER — Telehealth: Payer: Self-pay

## 2020-08-25 DIAGNOSIS — M1990 Unspecified osteoarthritis, unspecified site: Secondary | ICD-10-CM

## 2020-08-25 MED ORDER — OXYCODONE HCL 10 MG PO TABS: 5.0000 mg | ORAL_TABLET | Freq: Four times a day (QID) | ORAL | 0 refills | Status: DC | PRN

## 2020-08-25 NOTE — Telephone Encounter (Signed)
[employer] called and states the prescription for Oxycodone can't be more than a 30 day supply. Or the "#30 pills must last 90 days" must be taken out of the directions.

## 2020-08-25 NOTE — Telephone Encounter (Signed)
Rx amended Will still note that he needs to make 30 last 90 days

## 2020-08-26 NOTE — Telephone Encounter (Signed)
[facility] m/o pharmacy regarding amended rx. Per pharmacist, insurance will denied the amended rx. If provider wants #30 to last 90 days, pharmacy can only dispense #10/month. Pharmacist is going to send the first #10 tablets to pt, however requesting 2 additional rxs with #10 and specific dates to dispense.

## 2020-08-27 MED ORDER — OXYCODONE HCL 10 MG PO TABS: 5.0000 mg | ORAL_TABLET | Freq: Four times a day (QID) | ORAL | 0 refills | Status: DC | PRN

## 2020-08-27 NOTE — Telephone Encounter (Signed)
Sent "30 days" supply then  Patient can also   Meds ordered this encounter  Medications  . DISCONTD: Oxycodone HCl 10 MG TABS    Sig: Take 0.5-1 tablets (5-10 mg total) by mouth every 6 (six) hours as needed.    Dispense:  30 tablet    Refill:  0  . Oxycodone HCl 10 MG TABS    Sig: Take 0.5-1 tablets (5-10 mg total) by mouth every 6 (six) hours as needed.    Dispense:  10 tablet    Refill:  0   30 pills is also $10 at [facility] through [facility] if patient wants to just pay cash for 90 days supply

## 2020-08-27 NOTE — Addendum Note (Signed)
Addended by: Maryla Morrow on: 08/27/2020 03:52 PM   Modules accepted: Orders

## 2020-09-02 ENCOUNTER — Other Ambulatory Visit: Payer: Self-pay | Admitting: Osteopathic Medicine

## 2020-09-02 ENCOUNTER — Other Ambulatory Visit: Payer: Self-pay

## 2020-09-02 NOTE — Patient Outreach (Signed)
[facility]) Care Management  09/02/2020  Joseph Grimes 01-Mar-1961 [id number]     Received massage from [place] tech in response to patient application for patient assistance with insulin:   Per CPh[facility] workflow, patient has been outreached twice. Once on 08/27/20 and again on 09/02/2020. Both calls went unanswered and a voicemail was left each time. The voicemail from 08/2020 has not been returned. Therefore, since unable to maintain contact with patient, will close the patient assistance case.   Will gladly re open the case if the calls are returned and/or application is received back.   Thanks,   Luiz Ochoa. [place], [place]  [phone]    Mazi Schuff Durwin Reges, RN, MSN [facility] Management Care Management Coordinator Direct Line [phone] Cell [phone] Toll Free: [phone]  Fax: [phone]

## 2020-09-03 MED ORDER — FUROSEMIDE 20 MG PO TABS: 20.0000 mg | ORAL_TABLET | Freq: Every day | ORAL | 0 refills | Status: DC

## 2020-09-03 MED ORDER — PANTOPRAZOLE SODIUM 40 MG PO TBEC: 40.0000 mg | DELAYED_RELEASE_TABLET | Freq: Every day | ORAL | 0 refills | Status: DC

## 2020-09-07 ENCOUNTER — Other Ambulatory Visit: Payer: Medicare HMO

## 2020-09-10 ENCOUNTER — Other Ambulatory Visit: Payer: Self-pay

## 2020-09-10 ENCOUNTER — Ambulatory Visit (INDEPENDENT_AMBULATORY_CARE_PROVIDER_SITE_OTHER): Payer: Medicare HMO

## 2020-09-10 DIAGNOSIS — N433 Hydrocele, unspecified: Secondary | ICD-10-CM | POA: Diagnosis not present

## 2020-09-10 DIAGNOSIS — N492 Inflammatory disorders of scrotum: Secondary | ICD-10-CM | POA: Diagnosis not present

## 2020-10-08 DIAGNOSIS — Z9114 Patient's other noncompliance with medication regimen: Secondary | ICD-10-CM | POA: Diagnosis not present

## 2020-10-08 DIAGNOSIS — E1151 Type 2 diabetes mellitus with diabetic peripheral angiopathy without gangrene: Secondary | ICD-10-CM | POA: Diagnosis not present

## 2020-10-08 DIAGNOSIS — D692 Other nonthrombocytopenic purpura: Secondary | ICD-10-CM | POA: Diagnosis not present

## 2020-10-08 DIAGNOSIS — Z7902 Long term (current) use of antithrombotics/antiplatelets: Secondary | ICD-10-CM | POA: Diagnosis not present

## 2020-10-08 DIAGNOSIS — N183 Chronic kidney disease, stage 3 unspecified: Secondary | ICD-10-CM | POA: Diagnosis not present

## 2020-10-08 DIAGNOSIS — E1165 Type 2 diabetes mellitus with hyperglycemia: Secondary | ICD-10-CM | POA: Diagnosis not present

## 2020-10-08 DIAGNOSIS — F17200 Nicotine dependence, unspecified, uncomplicated: Secondary | ICD-10-CM | POA: Diagnosis not present

## 2020-10-08 DIAGNOSIS — Z89012 Acquired absence of left thumb: Secondary | ICD-10-CM | POA: Diagnosis not present

## 2020-10-08 DIAGNOSIS — E1142 Type 2 diabetes mellitus with diabetic polyneuropathy: Secondary | ICD-10-CM | POA: Diagnosis not present

## 2020-10-08 DIAGNOSIS — Z794 Long term (current) use of insulin: Secondary | ICD-10-CM | POA: Diagnosis not present

## 2020-10-08 DIAGNOSIS — E1122 Type 2 diabetes mellitus with diabetic chronic kidney disease: Secondary | ICD-10-CM | POA: Diagnosis not present

## 2020-10-08 DIAGNOSIS — L84 Corns and callosities: Secondary | ICD-10-CM | POA: Diagnosis not present

## 2020-10-08 DIAGNOSIS — Z89421 Acquired absence of other right toe(s): Secondary | ICD-10-CM | POA: Diagnosis not present

## 2020-10-08 DIAGNOSIS — Z7982 Long term (current) use of aspirin: Secondary | ICD-10-CM | POA: Diagnosis not present

## 2020-10-09 ENCOUNTER — Telehealth: Payer: Self-pay

## 2020-10-09 NOTE — Telephone Encounter (Signed)
NP Bevelyn Buckles called with several concerns for the patient. She is requesting physical therapy for the pt due to lower extremity weakness. Pt had 2 falls recently with no injuries but was unable to stand up after the fall and EMS was called for assistance. Pt has been without his 70/30 insulin for 4 months. Per pt, he is unable to afford the medication ($25 dollars/vial). 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 His A1c was >=13.0. NP Bevelyn Buckles stated that [facility] pharmacist provided pt with a form for patient's assistance for his insulin. However, pt was reluctant in completing the form because he is unsure whether he will qualified for the program. NP Anderson Malta has requested for patient to schedule an appointment with provider as soon as possible. No upcoming appointment scheduled. Attempted to contact the patient, no answer. Left a detailed vm msg to return a call back to the clinic for scheduling. Direct contact info provided.

## 2020-10-12 ENCOUNTER — Ambulatory Visit: Payer: Medicare HMO | Admitting: Osteopathic Medicine

## 2020-10-12 NOTE — Telephone Encounter (Signed)
Agree needs appt

## 2020-10-13 ENCOUNTER — Other Ambulatory Visit: Payer: Self-pay

## 2020-10-13 MED ORDER — ROSUVASTATIN CALCIUM 40 MG PO TABS: 40.0000 mg | ORAL_TABLET | Freq: Every day | ORAL | 0 refills | Status: DC

## 2020-10-21 ENCOUNTER — Other Ambulatory Visit: Payer: Self-pay | Admitting: Osteopathic Medicine

## 2020-11-25 ENCOUNTER — Telehealth: Payer: Self-pay

## 2020-11-25 DIAGNOSIS — M1990 Unspecified osteoarthritis, unspecified site: Secondary | ICD-10-CM

## 2020-11-25 MED ORDER — OXYCODONE HCL 10 MG PO TABS: 5.0000 mg | ORAL_TABLET | Freq: Four times a day (QID) | ORAL | 0 refills | Status: DC | PRN

## 2020-11-25 NOTE — Telephone Encounter (Signed)
Task completed. Pt has been updated that rx refill sent to mail order. No other inquiries during the call.

## 2020-11-25 NOTE — Telephone Encounter (Signed)
Pt called requesting med refill for oxycodone. Rx not listed in active med list.

## 2020-11-25 NOTE — Telephone Encounter (Signed)
Okay to refill, sent to [facility] which is where it has gone before

## 2020-12-02 ENCOUNTER — Other Ambulatory Visit: Payer: Self-pay | Admitting: Osteopathic Medicine

## 2020-12-14 ENCOUNTER — Other Ambulatory Visit: Payer: Self-pay | Admitting: Osteopathic Medicine

## 2020-12-18 ENCOUNTER — Telehealth: Payer: Self-pay | Admitting: Osteopathic Medicine

## 2020-12-18 NOTE — Progress Notes (Signed)
  Chronic Care Management   Note  12/18/2020 Name: JAZIEL POLHEMUS MRN: [id number] DOB: 29-Dec-1960  MORTEN LETO is a 60 y.o. year old male who is a primary care patient of Emeterio Reeve, DO. I reached out to [name] by phone today in response to a referral sent by Mr. Brandall Luong Bullinger's PCP, Emeterio Reeve, DO.   Mr. Kutz was given information about Chronic Care Management services today including:  1. CCM service includes personalized support from designated clinical staff supervised by his physician, including individualized plan of care and coordination with other care providers 2. 24/7 contact phone numbers for assistance for urgent and routine care needs. 3. Service will only be billed when office clinical staff spend 20 minutes or more in a month to coordinate care. 4. Only one practitioner may furnish and bill the service in a calendar month. 5. The patient may stop CCM services at any time (effective at the end of the month) by phone call to the office staff.   Patient agreed to services and verbal consent obtained.   Follow up plan:   [place]

## 2020-12-23 ENCOUNTER — Other Ambulatory Visit: Payer: Self-pay

## 2020-12-23 ENCOUNTER — Ambulatory Visit (INDEPENDENT_AMBULATORY_CARE_PROVIDER_SITE_OTHER): Payer: Medicare HMO | Admitting: Pharmacist

## 2020-12-23 DIAGNOSIS — E782 Mixed hyperlipidemia: Secondary | ICD-10-CM

## 2020-12-23 DIAGNOSIS — E118 Type 2 diabetes mellitus with unspecified complications: Secondary | ICD-10-CM

## 2020-12-23 DIAGNOSIS — I10 Essential (primary) hypertension: Secondary | ICD-10-CM

## 2020-12-23 LAB — POCT GLYCOSYLATED HEMOGLOBIN (HGB A1C): HbA1c POC (<> result, manual entry): >14 % (ref 4.0–5.6)

## 2020-12-23 NOTE — Progress Notes (Signed)
Chronic Care Management Pharmacy Note  12/23/2020 Name:  Joseph Grimes MRN:  [id number] DOB:  June 24, 1961  Subjective: Joseph Grimes is an 60 y.o. year old male who is a primary patient of Emeterio Reeve, DO.  The CCM team was consulted for assistance with disease management and care coordination needs.    Engaged with patient face to face for initial visit in response to provider referral for pharmacy case management and/or care coordination services.   Consent to Services:  The patient was given the following information about Chronic Care Management services today, agreed to services, and gave verbal consent: 1. CCM service includes personalized support from designated clinical staff supervised by the primary care provider, including individualized plan of care and coordination with other care providers 2. 24/7 contact phone numbers for assistance for urgent and routine care needs. 3. Service will only be billed when office clinical staff spend 20 minutes or more in a month to coordinate care. 4. Only one practitioner may furnish and bill the service in a calendar month. 5.The patient may stop CCM services at any time (effective at the end of the month) by phone call to the office staff. 6. The patient will be responsible for cost sharing (co-pay) of up to 20% of the service fee (after annual deductible is met). Patient agreed to services and consent obtained.  Patient Care Team: Emeterio Reeve, DO as PCP - General (Osteopathic Medicine) Darius Bump, [facility] as Pharmacist (Pharmacist)  Recent office visits: 07/23/20: medicare AWV 07/14/20: swollen scrotum  Recent consult visits: 01/29/20: Endocrinology [facility]) - patient states was video visit and lost to follow-up  Hospital visits: None in previous 6 months  Objective:  Lab Results  Component Value Date   CREATININE 1.82 (H) 11/26/2019   CREATININE 1.89 (H) 06/28/2019   CREATININE 1.63 (H) 12/27/2018     Lab Results  Component Value Date   HGBA1C >14 12/23/2020   Last diabetic Eye exam:  Lab Results  Component Value Date/Time   HMDIABEYEEXA Retinopathy (A) 05/20/2020 12:00 AM    Last diabetic Foot exam: No results found for: HMDIABFOOTEX      Component Value Date/Time   CHOL 339 (H) 12/27/2018 1156   TRIG 300 (H) 12/27/2018 1156   HDL 38 (L) 12/27/2018 1156   CHOLHDL 8.9 (H) 12/27/2018 1156   LDLCALC 250 (H) 12/27/2018 1156    Hepatic Function Latest Ref Rng & Units 11/26/2019 06/28/2019 12/27/2018  Total Protein 6.1 - 8.1 g/dL 5.9(L) 6.2 6.0(L)  AST 10 - 35 U/L 9(L) 14 15  ALT 9 - 46 U/L 9 11 15   Total Bilirubin 0.2 - 1.2 mg/dL 0.4 0.3 0.3    Lab Results  Component Value Date/Time   TSH 1.25 11/26/2019 11:14 AM    CBC Latest Ref Rng & Units 11/26/2019 12/27/2018 10/12/2018  WBC 3.8 - 10.8 Thousand/uL 11.6(H) 10.7 11.6(H)  Hemoglobin 13.2 - 17.1 g/dL 8.5(L) 14.4 14.7  Hematocrit 38.5 - 50.0 % 26.7(L) 44.1 43.9  Platelets 140 - 400 Thousand/uL 397 331 293    Lab Results  Component Value Date/Time   VD25OH 8 (L) 12/27/2018 11:56 AM    Clinical ASCVD: Yes  The ASCVD Risk score Mikey Bussing DC Jr., et al., 2013) failed to calculate for the following reasons:   The patient has a prior MI or stroke diagnosis    Other: (CHADS2VASc if Afib, PHQ9 if depression, MMRC or CAT for COPD, ACT, DEXA)  Social History   Tobacco Use  Smoking Status Current Every Day Smoker  . Packs/day: 0.50  Smokeless Tobacco Never Used  Tobacco Comment   refused   BP Readings from Last 3 Encounters:  07/14/20 (!) 142/74  11/26/19 (!) 108/59  10/22/19 137/72   Pulse Readings from Last 3 Encounters:  07/14/20 84  11/26/19 99  10/22/19 99   Wt Readings from Last 3 Encounters:  07/14/20 138 lb 1.9 oz (62.7 kg)  11/26/19 158 lb (71.7 kg)  10/22/19 158 lb 0.6 oz (71.7 kg)    Assessment: Review of patient past medical history, allergies, medications, health status, including review of  consultants reports, laboratory and other test data, was performed as part of comprehensive evaluation and provision of chronic care management services.   SDOH:  (Social Determinants of Health) assessments and interventions performed:  SDOH Interventions   Flowsheet Row Most Recent Value  SDOH Interventions   Financial Strain Interventions Other (Comment)  [patient assistance program for medication]      CCM Care Plan  No Known Allergies  Medications Reviewed Today    Reviewed by Darius Bump, [facility] (Pharmacist) on 12/23/20 at 1523  Med List Status: <None>  Medication Order Taking? Sig Documenting Provider Last Dose Status Informant  ACCU-CHEK AVIVA PLUS test strip [id number] No   Patient not taking: Reported on 12/23/2020   [provider] Not Taking Active   Accu-Chek Softclix Lancets lancets [id number] No Use to monitor blood glucose 4 time(s) daily  Patient not taking: Reported on 12/23/2020   [provider] Not Taking Active         Discontinued 12/23/20 1521 (Error)   aspirin 81 MG tablet [id number] Yes Take 1 tablet (81 mg total) by mouth daily. Emeterio Reeve, DO Taking Active   cilostazol (PLETAL) 100 MG tablet [id number] Yes TAKE 1 TABLET TWICE DAILY Emeterio Reeve, DO Taking Active         Discontinued 12/23/20 1521 (Error)   FEROSUL 325 (65 Fe) MG tablet [id number] Yes TAKE 1 TABLET TWICE DAILY WITH MEALS Emeterio Reeve, DO Taking Active            Med Note Dorene Ar Dec 23, 2020  3:21 PM) Takes 1 tablet daily.  furosemide (LASIX) 20 MG tablet [id number] Yes TAKE 1 TABLET EVERY DAY (OVERDUE FOR VISIT/LABS) Emeterio Reeve, DO Taking Active   insulin NPH-regular Human (70-30) 100 UNIT/ML injection [id number] No Inject 30 Units into the skin 2 (two) times daily.   Patient not taking: Reported on 12/23/2020   [provider] Not Taking Active         Discontinued 12/23/20 1522 (Error)   Oxycodone HCl 10 MG TABS [id number]  Take  0.5-1 tablets (5-10 mg total) by mouth every 6 (six) hours as needed. Emeterio Reeve, DO  Active         Discontinued 12/23/20 1522 (Error)   pantoprazole (PROTONIX) 40 MG tablet [id number] Yes TAKE 1 TABLET EVERY DAY (OVERDUE FOR VISIT/LABS) Emeterio Reeve, DO Taking Active         Discontinued 12/23/20 1522 (Error)   potassium chloride (KLOR-CON) 10 MEQ tablet [id number] Yes TAKE 1 TABLET EVERY DAY Emeterio Reeve, DO Taking Active         Discontinued 12/23/20 1523 (Error)   rosuvastatin (CRESTOR) 40 MG tablet [id number] Yes TAKE 1 TABLET EVERY DAY (NEED MD APPOINTMENT) Emeterio Reeve, DO Taking Active           Patient Active Problem List   Diagnosis Date Noted  .  Pilonidal cyst 11/25/2019  . AKI (acute kidney injury) ([place]) 11/03/2019  . Hyponatremia 11/03/2019  . Sepsis ([place]) 11/03/2019  . Weakness 11/03/2019  . Osteomyelitis of finger of left hand ([place])   . Vitamin D deficiency 12/28/2018  . Erectile dysfunction 12/28/2018  . PVC (premature ventricular contraction) 12/27/2018  . Essential hypertension 10/11/2018  . Coronary artery disease 10/11/2018  . Peripheral vascular disease ([place]) 10/11/2018  . Mixed hyperlipidemia 10/11/2018  . Osteoarthritis 10/11/2018  . CKD (chronic kidney disease) stage 3, GFR 30-59 ml/min (HCC) 10/11/2018  . History of anemia 10/11/2018  . Other chronic pain 06/08/2018  . Vaccine refused by patient 06/07/2018  . Abnormal stress test 04/10/2018  . Non-traumatic rhabdomyolysis 02/01/2018  . Hypokalemia 02/01/2018  . Hypophosphatemia 02/01/2018  . Perianal abscess 10/16/2017  . Nicotine dependence with current use 10/12/2017  . Perirectal abscess 10/12/2017  . Drug-induced constipation 10/12/2017  . GI bleed 09/15/2017  . Elevated troponin 06/15/2017  . Chronic diastolic heart failure ([place]) 06/15/2017  . Normocytic anemia 06/15/2017  . Type 2 diabetes mellitus with diabetic neuropathy, with long-term current use of insulin ([place])  04/24/2017  . History of cerebrovascular accident 09/20/2016  . Bone lesion 09/29/2014  . Finger pain, right 09/29/2014  . S/P coronary artery stent placement 12/20/2012  . Old myocardial infarction 10/27/2010    Immunization History  Administered Date(s) Administered  . Janssen (J&J) SARS-COV-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 12/04/2019  . PFIZER(Purple Top)SARS-COV-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 06/10/2020    Conditions to be addressed/monitored: HTN, HLD and DMII  Care Plan : Medication Management  Updates made by Darius Bump, [place] since 12/23/2020 12:00 AM    Problem: Diabetes, HTN, HLD   Priority: High    Long-Range Goal: Disease Progression Prevention   Start Date: 12/23/2020  This Visit's Progress: On track  Priority: High  Note:   Current Barriers:  . Unable to independently afford treatment regimen . Unable to achieve control of diabetes  . Does not adhere to prescribed medication regimen  Pharmacist Clinical Goal(s):  Marland Kitchen Over the next 90 days, patient will verbalize ability to afford treatment regimen . achieve adherence to monitoring guidelines and medication adherence to achieve therapeutic efficacy . adhere to prescribed medication regimen as evidenced by fill history & verbal confirmation of patient assistance status  through collaboration with PharmD and provider.   Interventions: . 1:1 collaboration with Emeterio Reeve, DO regarding development and update of comprehensive plan of care as evidenced by provider attestation and co-signature . Inter-disciplinary care team collaboration (see longitudinal plan of care) . Comprehensive medication review performed; medication list updated in electronic medical record  Diabetes: . Uncontrolled: no current treatment, patient has previously been on NPH 70/30 BID, but unable to afford and has been without medication or checking BG for ~72month . Current glucose readings: not currently checking . Denies hypoglycemic/hyperglycemic symptoms . Current  meal patterns: breakfast: eggs, biscuit, sausage or bacon, corn beef hash; lunch: skips lunch; dinner: varies - spaghetti, cheeseburgers, pork sausage; drinks: Mt Dew (2-3 per day) . Current exercise: limited by residual stroke deficits but does shoulder rotations . Educated on diabetes progression, realistic diet modification options, and medication necessity Recommended small goal of incorporating a green vegetable on his plate at least 3 times per week Assessed patient finances. Fixed income, medicare without part D, initiated [employer] patient assistance for [employer],   Initiated Toujeo 15 units nightly, sample provided Hypertension: . Uncontrolled; current treatment: furosemide 255mdaily . Current home readings: not checking at home . Denies hypotensive/hypertensive  symptoms . Recommended purchase of BP measurement device and checking at home, educated on proper technique of checking BP Assessed medication therapy, to be addressed at subsequent visits  (consider lisinopril 83m) Hyperlipidemia: . Controlled, current treatment: Rosuvastatin 423m  . Medications previously tried: N/A . Educated on multiple benefits of statin therapy Recommended continue current regimen  Patient Goals/Self-Care Activities . Over the next 90 days, patient will:  take medications as prescribed, check glucose 2x day (morning and afternoon), document, and provide at future appointments, collaborate with provider on medication access solutions, and engage in dietary modifications by choosing a green vegetable at least 3x per week  Follow Up Plan: Telephone follow up appointment with care management team member scheduled for:  01/22/21 at 11:00am          Medication Assistance: Application for [employer] [Toujeo]  medication assistance program. in process.  Anticipated assistance start date 12/30/20.  See plan of care for additional detail.  Patient's preferred pharmacy is:  [address]: [phone]: [id number] [place] [address]: [phone]: [phone] Follow Up:  Patient agrees to Care Plan and Follow-up.  Plan:  Telephone follow up appointment with care management team member scheduled for:  01/22/21 at 11:00am  KeDarius BumpRPCurahealth Hospital [facility]

## 2020-12-23 NOTE — Patient Instructions (Signed)
Visit Information   PATIENT GOALS:  Goals Addressed   None     Consent to CCM Services: Joseph Grimes was given information about Chronic Care Management services today including:  1. CCM service includes personalized support from designated clinical staff supervised by his physician, including individualized plan of care and coordination with other care providers 2. 24/7 contact phone numbers for assistance for urgent and routine care needs. 3. Service will only be billed when office clinical staff spend 20 minutes or more in a month to coordinate care. 4. Only one practitioner may furnish and bill the service in a calendar month. 5. The patient may stop CCM services at any time (effective at the end of the month) by phone call to the office staff. 6. The patient will be responsible for cost sharing (co-pay) of up to 20% of the service fee (after annual deductible is met).  Patient agreed to services and verbal consent obtained.   Print copy of patient instructions, educational materials, and care plan provided in person.  Telephone follow up appointment with care management team member scheduled for: 01/22/21 at 11:00am  Signature [place] CARE PLAN: Patient Care Plan: Medication Management    Problem Identified: Diabetes, HTN, HLD   Priority: High    Long-Range Goal: Disease Progression Prevention   Start Date: 12/23/2020  This Visit's Progress: On track  Priority: High  Note:   Current Barriers:  . Unable to independently afford treatment regimen . Unable to achieve control of diabetes  . Does not adhere to prescribed medication regimen  Pharmacist Clinical Goal(s):  Marland Kitchen Over the next 90 days, patient will verbalize ability to afford treatment regimen . achieve adherence to monitoring guidelines and medication adherence to achieve therapeutic efficacy . adhere to prescribed medication regimen as evidenced by fill history & verbal confirmation of patient  assistance status  through collaboration with PharmD and provider.   Interventions: . 1:1 collaboration with Emeterio Reeve, DO regarding development and update of comprehensive plan of care as evidenced by provider attestation and co-signature . Inter-disciplinary care team collaboration (see longitudinal plan of care) . Comprehensive medication review performed; medication list updated in electronic medical record  Diabetes: . Uncontrolled: no current treatment, patient has previously been on NPH 70/30 BID, but unable to afford and has been without medication or checking BG for ~254month . Current glucose readings: not currently checking . Denies hypoglycemic/hyperglycemic symptoms . Current meal patterns: breakfast: eggs, biscuit, sausage or bacon, corn beef hash; lunch: skips lunch; dinner: varies - spaghetti, cheeseburgers, pork sausage; drinks: Mt Dew (2-3 per day) . Current exercise: limited by residual stroke deficits but does shoulder rotations . Educated on diabetes progression, realistic diet modification options, and medication necessity Recommended small goal of incorporating a green vegetable on his plate at least 3 times per week Assessed patient finances. Fixed income, medicare without part D, initiated [employer] patient assistance for [employer],   Initiated Toujeo 15 units nightly, sample provided Hypertension: . Uncontrolled; current treatment: furosemide 247mdaily . Current home readings: not checking at home . Denies hypotensive/hypertensive symptoms . Recommended purchase of BP measurement device and checking at home, educated on proper technique of checking BP Assessed medication therapy, to be addressed at subsequent visits  (consider lisinopril 54m71mHyperlipidemia: . Controlled, current treatment: Rosuvastatin 36m31m. Medications previously tried: N/A . Educated on multiple benefits of statin therapy Recommended continue current regimen  Patient Goals/Self-Care  Activities . Over the next 90 days, patient  will:  take medications as prescribed, check glucose 2x day (morning and afternoon), document, and provide at future appointments, collaborate with provider on medication access solutions, and engage in dietary modifications by choosing a green vegetable at least 3x per week  Follow Up Plan: Telephone follow up appointment with care management team member scheduled for:  01/22/21 at 11:00am

## 2020-12-24 LAB — COMPLETE METABOLIC PANEL WITH GFR
AG Ratio: 1.5 (calc) (ref 1.0–2.5)
ALT: 14 U/L (ref 9–46)
AST: 10 U/L (ref 10–35)
Albumin: 3.7 g/dL (ref 3.6–5.1)
Alkaline phosphatase (APISO): 147 U/L — ABNORMAL HIGH (ref 35–144)
BUN/Creatinine Ratio: 11 (calc) (ref 6–22)
BUN: 22 mg/dL (ref 7–25)
CO2: 23 mmol/L (ref 20–32)
Calcium: 9.2 mg/dL (ref 8.6–10.3)
Chloride: 96 mmol/L — ABNORMAL LOW (ref 98–110)
Creat: 1.94 mg/dL — ABNORMAL HIGH (ref 0.70–1.25)
GFR, Est African American: 42 mL/min/{1.73_m2} — ABNORMAL LOW (ref >=60)
GFR, Est Non African American: 36 mL/min/{1.73_m2} — ABNORMAL LOW (ref >=60)
Globulin: 2.4 g/dL (calc) (ref 1.9–3.7)
Glucose, Bld: 715 mg/dL (ref 65–99)
Potassium: 4.1 mmol/L (ref 3.5–5.3)
Sodium: 127 mmol/L — ABNORMAL LOW (ref 135–146)
Total Bilirubin: 0.3 mg/dL (ref 0.2–1.2)
Total Protein: 6.1 g/dL (ref 6.1–8.1)

## 2020-12-24 MED ORDER — INSULIN PEN NEEDLE 29G X 12.7MM MISC: 100.0000 | Freq: Every day | 2 refills | Status: DC

## 2020-12-24 MED ORDER — TOUJEO MAX SOLOSTAR 300 UNIT/ML ~~LOC~~ SOPN: 16.0000 [IU] | PEN_INJECTOR | Freq: Every evening | SUBCUTANEOUS | 2 refills | Status: DC

## 2020-12-24 NOTE — Addendum Note (Signed)
Addended by: Darius Bump on: 12/24/2020 05:24 PM   Modules accepted: Orders

## 2021-01-06 ENCOUNTER — Ambulatory Visit (INDEPENDENT_AMBULATORY_CARE_PROVIDER_SITE_OTHER): Payer: Medicare HMO | Admitting: Pharmacist

## 2021-01-06 ENCOUNTER — Other Ambulatory Visit: Payer: Self-pay | Admitting: Osteopathic Medicine

## 2021-01-06 ENCOUNTER — Other Ambulatory Visit: Payer: Self-pay

## 2021-01-06 DIAGNOSIS — E782 Mixed hyperlipidemia: Secondary | ICD-10-CM

## 2021-01-06 DIAGNOSIS — E118 Type 2 diabetes mellitus with unspecified complications: Secondary | ICD-10-CM | POA: Diagnosis not present

## 2021-01-06 DIAGNOSIS — I10 Essential (primary) hypertension: Secondary | ICD-10-CM

## 2021-01-06 NOTE — Progress Notes (Signed)
Chronic Care Management Pharmacy Note  01/06/2021 Name:  Joseph Grimes MRN:  [id number] DOB:  09/08/1960   Subjective: Joseph Grimes is an 60 y.o. year old male who is a primary patient of Joseph Reeve, DO.  The CCM team was consulted for assistance with disease management and care coordination needs.    Engaged with patient face to face for follow up visit in response to provider referral for pharmacy case management and/or care coordination services.   Consent to Services:  The patient was given information about Chronic Care Management services, agreed to services, and gave verbal consent prior to initiation of services.  Please see initial visit note for detailed documentation.   Patient Care Team: Joseph Reeve, DO as PCP - General (Osteopathic Medicine) Joseph Grimes, [facility] as Pharmacist (Pharmacist)  Recent office visits: 12/23/20 - initial CCM pharmacist visit: a1c >14, initiated toujeo samples + sanofi patient assistance application  Recent consult visits: [facility] visits: None in previous 6 months  Objective:  Lab Results  Component Value Date   CREATININE 1.94 (H) 12/23/2020   CREATININE 1.82 (H) 11/26/2019   CREATININE 1.89 (H) 06/28/2019    Lab Results  Component Value Date   HGBA1C >14 12/23/2020   Last diabetic Eye exam:  Lab Results  Component Value Date/Time   HMDIABEYEEXA Retinopathy (A) 05/20/2020 12:00 AM    Last diabetic Foot exam: No results found for: HMDIABFOOTEX      Component Value Date/Time   CHOL 339 (H) 12/27/2018 1156   TRIG 300 (H) 12/27/2018 1156   HDL 38 (L) 12/27/2018 1156   CHOLHDL 8.9 (H) 12/27/2018 1156   LDLCALC 250 (H) 12/27/2018 1156    Hepatic Function Latest Ref Rng & Units 12/23/2020 11/26/2019 06/28/2019  Total Protein 6.1 - 8.1 g/dL 6.1 5.9(L) 6.2  AST 10 - 35 U/L 10 9(L) 14  ALT 9 - 46 U/L '14 9 11  '$ Total Bilirubin 0.2 - 1.2 mg/dL 0.3 0.4 0.3    Lab Results  Component Value Date/Time    TSH 1.25 11/26/2019 11:14 AM    CBC Latest Ref Rng & Units 11/26/2019 12/27/2018 10/12/2018  WBC 3.8 - 10.8 Thousand/uL 11.6(H) 10.7 11.6(H)  Hemoglobin 13.2 - 17.1 g/dL 8.5(L) 14.4 14.7  Hematocrit 38.5 - 50.0 % 26.7(L) 44.1 43.9  Platelets 140 - 400 Thousand/uL 397 331 293      Social History   Tobacco Use  Smoking Status Current Every Day Smoker  . Packs/day: 0.50  Smokeless Tobacco Never Used  Tobacco Comment   refused   BP Readings from Last 3 Encounters:  07/14/20 (!) 142/74  11/26/19 (!) 108/59  10/22/19 137/72   Pulse Readings from Last 3 Encounters:  07/14/20 84  11/26/19 99  10/22/19 99   Wt Readings from Last 3 Encounters:  07/14/20 138 lb 1.9 oz (62.7 kg)  11/26/19 158 lb (71.7 kg)  10/22/19 158 lb 0.6 oz (71.7 kg)    Assessment: Review of patient past medical history, allergies, medications, health status, including review of consultants reports, laboratory and other test data, was performed as part of comprehensive evaluation and provision of chronic care management services.   SDOH:  (Social Determinants of Health) assessments and interventions performed:    CCM Care Plan  No Known Allergies  Medications Reviewed Today    Reviewed by Joseph Grimes, [facility] (Pharmacist) on 01/06/21 at 1147  Med List Status: <None>  Medication Order Taking? Sig Documenting Provider Last Dose Status Informant  ACCU-CHEK AVIVA PLUS test strip [id number] Yes  [provider] Taking Active   Accu-Chek Softclix Lancets lancets [id number] Yes  [provider] Taking Active   aspirin 81 MG tablet [id number] Yes Take 1 tablet (81 mg total) by mouth daily. Joseph Reeve, DO Taking Active   cilostazol (PLETAL) 100 MG tablet [id number] Yes TAKE 1 TABLET TWICE DAILY Joseph Reeve, DO Taking Active   FEROSUL 325 (65 Fe) MG tablet [id number] Yes TAKE 1 TABLET TWICE DAILY WITH MEALS Joseph Reeve, DO Taking Active            Med Note Joseph Grimes  Dec 23, 2020  3:21 PM) Takes 1 tablet daily.  furosemide (LASIX) 20 MG tablet [id number] Yes TAKE 1 TABLET EVERY DAY (OVERDUE FOR VISIT/LABS) Joseph Reeve, DO Taking Active   insulin glargine, 2 Unit Dial, (TOUJEO MAX SOLOSTAR) 300 UNIT/ML Solostar Pen [id number] Yes Inject 16 Units into the skin at bedtime. Increase by 4 units every 4 days until fasting (morning) blood sugar is <150, then stay at that dose. Joseph Reeve, DO Taking Active   Insulin Pen Needle 29G X 12.7MM MISC [id number] Yes 100 each by Does not apply route daily. Please dispense qty, brand, appropriate pen sizing for toujeo solostar Joseph Reeve, DO Taking Active   pantoprazole (PROTONIX) 40 MG tablet [id number] Yes TAKE 1 TABLET EVERY DAY (OVERDUE FOR VISIT/LABS) Joseph Reeve, DO Taking Active   potassium chloride (KLOR-CON) 10 MEQ tablet [id number] Yes TAKE 1 TABLET EVERY DAY Joseph Reeve, DO Taking Active   rosuvastatin (CRESTOR) 40 MG tablet [id number] Yes TAKE 1 TABLET EVERY DAY (NEED MD APPOINTMENT) Joseph Reeve, DO Taking Active           Patient Active Problem List   Diagnosis Date Noted  . Pilonidal cyst 11/25/2019  . AKI (acute kidney injury) ([place]) 11/03/2019  . Hyponatremia 11/03/2019  . Sepsis ([place]) 11/03/2019  . Weakness 11/03/2019  . Osteomyelitis of finger of left hand ([place])   . Vitamin D deficiency 12/28/2018  . Erectile dysfunction 12/28/2018  . PVC (premature ventricular contraction) 12/27/2018  . Essential hypertension 10/11/2018  . Coronary artery disease 10/11/2018  . Peripheral vascular disease ([place]) 10/11/2018  . Mixed hyperlipidemia 10/11/2018  . Osteoarthritis 10/11/2018  . CKD (chronic kidney disease) stage 3, GFR 30-59 ml/min (HCC) 10/11/2018  . History of anemia 10/11/2018  . Other chronic pain 06/08/2018  . Vaccine refused by patient 06/07/2018  . Abnormal stress test 04/10/2018  . Non-traumatic rhabdomyolysis 02/01/2018  . Hypokalemia 02/01/2018  .  Hypophosphatemia 02/01/2018  . Perianal abscess 10/16/2017  . Nicotine dependence with current use 10/12/2017  . Perirectal abscess 10/12/2017  . Drug-induced constipation 10/12/2017  . GI bleed 09/15/2017  . Elevated troponin 06/15/2017  . Chronic diastolic heart failure ([place]) 06/15/2017  . Normocytic anemia 06/15/2017  . Type 2 diabetes mellitus with diabetic neuropathy, with long-term current use of insulin ([place]) 04/24/2017  . History of cerebrovascular accident 09/20/2016  . Bone lesion 09/29/2014  . Finger pain, right 09/29/2014  . S/P coronary artery stent placement 12/20/2012  . Old myocardial infarction 10/27/2010    Immunization History  Administered Date(s) Administered  . Janssen (J&J) SARS-COV-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 12/04/2019  . PFIZER(Purple Top)SARS-COV-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 06/10/2020    Conditions to be addressed/monitored: HTN, HLD and DMII  Care Plan : Medication Management  Updates made by Joseph Grimes, [place] since 01/06/2021 12:00 AM    Problem: Diabetes, HTN, HLD   Priority: High    Long-Range  Goal: Disease Progression Prevention   Start Date: 12/23/2020  Recent Progress: On track  Priority: High  Note:   Current Barriers:  . Unable to independently afford treatment regimen . Unable to achieve control of diabetes  . Does not adhere to prescribed medication regimen  Pharmacist Clinical Goal(s):  Marland Kitchen Over the next 90 days, patient will verbalize ability to afford treatment regimen . achieve adherence to monitoring guidelines and medication adherence to achieve therapeutic efficacy . adhere to prescribed medication regimen as evidenced by fill history & verbal confirmation of patient assistance status  through collaboration with PharmD and provider.   Interventions: . 1:1 collaboration with Joseph Reeve, DO regarding development and update of comprehensive plan of care as evidenced by provider attestation and co-signature . Inter-disciplinary care team  collaboration (see longitudinal plan of care) . Comprehensive medication review performed; medication list updated in electronic medical record  Diabetes: . Uncontrolled: no current treatment, patient has previously been on NPH 70/30 BID, but unable to afford and has been without medication or checking BG for ~8month . Current glucose readings: 270s in AM fasting, 300s for PM. Advised patient to write down numbers & will discuss at next f/u appt . Denies hypoglycemic/hyperglycemic symptoms . Current meal patterns: breakfast: eggs, biscuit, sausage or bacon, corn beef hash; lunch: skips lunch; dinner: varies - spaghetti, cheeseburgers, pork sausage; drinks: Mt Dew (2-3 per day) . Current exercise: limited by residual stroke deficits but does shoulder rotations . Previously Recommended small goal of incorporating a green vegetable on his plate at least 3 times per week, patient reports he is doing well with this Assessed patient finances. Fixed income, medicare without part D, initiated [employer] patient assistance for [employer],   Patient assistance for toujeo ([employer] PAP) approved 12/28/20, medication delivered to primary care office 01/06/21, 90DS will continue to be delivered. Recertification of application for [id number] can be completed July 22, 2021. Patient picked up medication today and is following dosage units increase per RX. Hypertension: . Uncontrolled; current treatment: furosemide '20mg'$  daily . Current home readings: not checking at home . Denies hypotensive/hypertensive symptoms . Recommended purchase of BP measurement device and checking at home, educated on proper technique of checking BP Assessed medication therapy, to be addressed at subsequent visits  (consider lisinopril '5mg'$ )  Hyperlipidemia: . Controlled, current treatment: Rosuvastatin '40mg'$ ;  . Medications previously tried: N/A . Educated on multiple benefits of statin therapy Recommended continue current regimen  Patient  Goals/Self-Care Activities . Over the next 90 days, patient will:  take medications as prescribed, check glucose 2x day (morning and afternoon), document, and provide at future appointments, collaborate with provider on medication access solutions, and engage in dietary modifications by choosing a green vegetable at least 3x per week  Follow Up Plan: Telephone follow up appointment with care management team member scheduled for:  01/22/21 at 11:00am          Medication Assistance: Toujeoobtained through [place]ication assistance program.  Enrollment ends 08/07/21 (resubmit for 2023 year on 07/22/21)  Patient's preferred pharmacy is:  [address]: [phone]: [address] [place]Phone: [phone]: [phone]  Follow Up:  Patient agrees to Care Plan and Follow-up.  Plan: Telephone follow up appointment with care management  team member scheduled for:  June 17 at 11:00am  Joseph Grimes

## 2021-01-06 NOTE — Telephone Encounter (Signed)
Patient made a f/u appt with PCP on 01/11/21 but stated he was out of his Oxycodone and needed a refill. AM

## 2021-01-06 NOTE — Patient Instructions (Signed)
Visit Information  PATIENT GOALS: Goals Addressed            This Visit's Progress   . Medication Management       Patient Goals/Self-Care Activities . Over the next 90 days, patient will:  take medications as prescribed, check glucose 2x day (morning and afternoon), document, and provide at future appointments, collaborate with provider on medication access solutions, and engage in dietary modifications by choosing a green vegetable at least 3x per week  Follow Up Plan: Telephone follow up appointment with care management team member scheduled for:  01/22/21 at 11:00am         The patient verbalized understanding of instructions, educational materials, and care plan provided today and agreed to receive a mailed copy of patient instructions, educational materials, and care plan.   Telephone follow up appointment with care management team member scheduled for: 01/22/21 at 11:00am  Darius Bump

## 2021-01-07 DIAGNOSIS — E1165 Type 2 diabetes mellitus with hyperglycemia: Secondary | ICD-10-CM | POA: Diagnosis not present

## 2021-01-07 DIAGNOSIS — Z794 Long term (current) use of insulin: Secondary | ICD-10-CM | POA: Diagnosis not present

## 2021-01-07 NOTE — Telephone Encounter (Signed)
[employer] m/o order pharmacy

## 2021-01-07 NOTE — Telephone Encounter (Signed)
Need to know pharmacy

## 2021-01-08 ENCOUNTER — Telehealth: Payer: Self-pay

## 2021-01-08 DIAGNOSIS — M1990 Unspecified osteoarthritis, unspecified site: Secondary | ICD-10-CM

## 2021-01-08 MED ORDER — OXYCODONE HCL 10 MG PO TABS: 5.0000 mg | ORAL_TABLET | Freq: Four times a day (QID) | ORAL | 0 refills | Status: DC | PRN

## 2021-01-08 MED ORDER — HYDROCODONE-ACETAMINOPHEN 5-325 MG PO TABS: 1.0000 | ORAL_TABLET | Freq: Three times a day (TID) | ORAL | 0 refills | Status: DC | PRN

## 2021-01-08 NOTE — Telephone Encounter (Signed)
Ok please cancel hydrocodone  Oxycodone sent instead apologies for error

## 2021-01-08 NOTE — Telephone Encounter (Signed)
Task completed. Spoke with mail order pharmacist. Hydrocodone rx cancelled. Delivery will be set up for the oxycodone rx.

## 2021-01-08 NOTE — Telephone Encounter (Signed)
Patient called stating that the wrong pain rx was refilled. Per pt, he is requesting oxycodone. Pls advise, thanks.

## 2021-01-11 ENCOUNTER — Ambulatory Visit: Payer: Medicare HMO | Admitting: Osteopathic Medicine

## 2021-01-11 ENCOUNTER — Telehealth: Payer: Self-pay | Admitting: Osteopathic Medicine

## 2021-01-11 NOTE — Telephone Encounter (Signed)
Patient didn't say why they missed appointment today. Rescheduled for Wednesday.

## 2021-01-13 ENCOUNTER — Encounter: Payer: Self-pay | Admitting: Osteopathic Medicine

## 2021-01-13 ENCOUNTER — Ambulatory Visit (INDEPENDENT_AMBULATORY_CARE_PROVIDER_SITE_OTHER): Payer: Medicare HMO | Admitting: Osteopathic Medicine

## 2021-01-13 ENCOUNTER — Other Ambulatory Visit: Payer: Self-pay

## 2021-01-13 VITALS — BP 123/69 | HR 85 | Temp 97.2°F | Wt 137.0 lb

## 2021-01-13 DIAGNOSIS — G8929 Other chronic pain: Secondary | ICD-10-CM | POA: Diagnosis not present

## 2021-01-13 DIAGNOSIS — M1990 Unspecified osteoarthritis, unspecified site: Secondary | ICD-10-CM

## 2021-01-13 DIAGNOSIS — Z9181 History of falling: Secondary | ICD-10-CM

## 2021-01-13 DIAGNOSIS — R262 Difficulty in walking, not elsewhere classified: Secondary | ICD-10-CM

## 2021-01-13 NOTE — Progress Notes (Signed)
Joseph Grimes is a 60 y.o. male who presents to  [place] at [facility]  today, 01/13/21, seeking care for the following:  Marland Kitchen Maintain opiate pain Rx      ASSESSMENT & PLAN with other pertinent findings:  The primary encounter diagnosis was Osteoarthritis, unspecified osteoarthritis type, unspecified site. Diagnoses of Other chronic pain, Ambulatory dysfunction, and At moderate risk for fall were also pertinent to this visit.   Indication for chronic opioid: severe arthritis Medication and dose:  # pills per month: 10 Last UDS date: updated today Opioid Treatment Agreement signed (Y/N): 01/13/21  Opioid Treatment Agreement last reviewed with patient:   NCCSRS reviewed this encounter (include red flags): Yesno concerns    Patient Instructions   As required by [facility] LAW to maintain opiate prescriptions:  Office visits are required every 3 months   Urine drug screen once per year AND/OR at the prescriber's discretion - updated today 01/13/21   Update controlled substance agreement once per year - updated today 01/13/21    Continue follow up with our pharmacist for diabetes medications - she and I are cooperating closely and she will let me know how you're doing!       Orders Placed This Encounter  Procedures  . DRUG MONITOR, PANEL 5, SCREEN, URINE  . Ambulatory referral to Home Health    No orders of the defined types were placed in this encounter.    See below for relevant physical exam findings  See below for recent lab and imaging results reviewed  Medications, allergies, PMH, PSH, SocH, [place] reviewed below    Follow-up instructions: Return in about 3 months (around 04/15/2021) for [place], SEE [place] SOONER IF NEEDED. CALL/MESSAGE W/ QUESTIONS.                                        Exam:  BP 123/69 (BP Location: Left Arm, Patient Position: Sitting, Cuff  Size: Normal)   Pulse 85   Temp (!) 97.2 F (36.2 C) (Oral)   Wt 137 lb 0.6 oz (62.2 kg)   BMI 20.24 kg/m   Constitutional: VS see above. General Appearance: alert, well-developed, well-nourished, NAD  Neck: No masses, trachea midline.   Respiratory: Normal respiratory effort. no wheeze, no rhonchi, no rales  Cardiovascular: S1/S2 normal, no murmur, no rub/gallop auscultated. RRR.   Neurological: Normal balance/coordination. No tremor.  Skin: warm, dry, intact.   Psychiatric: Normal judgment/insight. Normal mood and affect. Oriented x3.   Current Meds  Medication Sig  . ACCU-CHEK AVIVA PLUS test strip   . Accu-Chek Softclix Lancets lancets   . aspirin 81 MG tablet Take 1 tablet (81 mg total) by mouth daily.  . cilostazol (PLETAL) 100 MG tablet TAKE 1 TABLET TWICE DAILY  . FEROSUL 325 (65 Fe) MG tablet TAKE 1 TABLET TWICE DAILY WITH MEALS  . furosemide (LASIX) 20 MG tablet TAKE 1 TABLET EVERY DAY (OVERDUE FOR VISIT/LABS)  . insulin glargine, 2 Unit Dial, (TOUJEO MAX SOLOSTAR) 300 UNIT/ML Solostar Pen Inject 16 Units into the skin at bedtime. Increase by 4 units every 4 days until fasting (morning) blood sugar is <150, then stay at that dose.  . Insulin Pen Needle 29G X 12.7MM MISC 100 each by Does not apply route daily. Please dispense qty, brand, appropriate pen sizing for [employer]  . Oxycodone HCl 10 MG  TABS Take 0.5-1 tablets (5-10 mg total) by mouth every 6 (six) hours as needed.  . pantoprazole (PROTONIX) 40 MG tablet TAKE 1 TABLET EVERY DAY (OVERDUE FOR VISIT/LABS)  . potassium chloride (KLOR-CON) 10 MEQ tablet TAKE 1 TABLET EVERY DAY  . rosuvastatin (CRESTOR) 40 MG tablet TAKE 1 TABLET EVERY DAY (NEED MD APPOINTMENT)    No Known Allergies  Patient Active Problem List   Diagnosis Date Noted  . Pilonidal cyst 11/25/2019  . AKI (acute kidney injury) ([place]) 11/03/2019  . Hyponatremia 11/03/2019  . Sepsis ([place]) 11/03/2019  . Weakness 11/03/2019  . Osteomyelitis  of finger of left hand ([place])   . Vitamin D deficiency 12/28/2018  . Erectile dysfunction 12/28/2018  . PVC (premature ventricular contraction) 12/27/2018  . Essential hypertension 10/11/2018  . Coronary artery disease 10/11/2018  . Peripheral vascular disease ([place]) 10/11/2018  . Mixed hyperlipidemia 10/11/2018  . Osteoarthritis 10/11/2018  . CKD (chronic kidney disease) stage 3, GFR 30-59 ml/min (HCC) 10/11/2018  . History of anemia 10/11/2018  . Other chronic pain 06/08/2018  . Vaccine refused by patient 06/07/2018  . Abnormal stress test 04/10/2018  . Non-traumatic rhabdomyolysis 02/01/2018  . Hypokalemia 02/01/2018  . Hypophosphatemia 02/01/2018  . Perianal abscess 10/16/2017  . Nicotine dependence with current use 10/12/2017  . Perirectal abscess 10/12/2017  . Drug-induced constipation 10/12/2017  . GI bleed 09/15/2017  . Elevated troponin 06/15/2017  . Chronic diastolic heart failure ([place]) 06/15/2017  . Normocytic anemia 06/15/2017  . Type 2 diabetes mellitus with diabetic neuropathy, with long-term current use of insulin ([place]) 04/24/2017  . History of cerebrovascular accident 09/20/2016  . Bone lesion 09/29/2014  . Finger pain, right 09/29/2014  . S/P coronary artery stent placement 12/20/2012  . Old myocardial infarction 10/27/2010    Family History  Problem Relation Age of Onset  . Diabetes Sister   . Diabetes Brother   . Heart disease Mother   . Heart attack Father   . Diabetes Father   . Diabetes Sister     Social History   Tobacco Use  Smoking Status Current Every Day Smoker  . Packs/day: 0.50  Smokeless Tobacco Never Used  Tobacco Comment   refused    Past Surgical History:  Procedure Laterality Date  . AMPUTATION Left 07/19/2019   Procedure: LEFT PARTIAL THUMB AMPUTATION;  Surgeon: Leandrew Koyanagi, MD;  Location: [facility];  Service: Orthopedics;  Laterality: Left;  with digital block  . CARDIAC CATHETERIZATION  2006   04/18/18: no  stents, stent in '06  . FEMORAL-POPLITEAL BYPASS GRAFT Right 05/12/2017  . HAND SURGERY    . KNEE ARTHROSCOPY     bilat  . TOE AMPUTATION     x2  . TONSILLECTOMY      Immunization History  Administered Date(s) Administered  . Janssen (J&J) SARS-COV-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 12/04/2019  . PFIZER(Purple Top)SARS-COV-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 06/10/2020    Recent Results (from the past 2160 hour(s))  POCT HgB A1C     Status: None   Collection Time: 12/23/20 12:02 PM  Result Value Ref Range   Hemoglobin A1C     HbA1c POC (<> result, manual entry) >14 4.0 - 5.6 %   HbA1c, POC (prediabetic range)     HbA1c, POC (controlled diabetic range)    COMPLETE METABOLIC PANEL WITH GFR     Status: Abnormal   Collection Time: 12/23/20 12:18 PM  Result Value Ref Range   Glucose, Bld 715 (HH) 65 - 99 mg/dL    Comment: Verified  by repeat analysis. Marland Kitchen .            Fasting reference interval . For someone without known diabetes, a glucose value >125 mg/dL indicates that they may have diabetes and this should be confirmed with a follow-up test. .    BUN 22 7 - 25 mg/dL   Creat 1.94 (H) 0.70 - 1.25 mg/dL    Comment: For patients >85 years of age, the reference limit for Creatinine is approximately 13% higher for people identified as African-American. .    GFR, Est Non African American 36 (L) > OR = 60 mL/min/1.44m   GFR, Est African American 42 (L) > OR = 60 mL/min/1.720m  BUN/Creatinine Ratio 11 6 - 22 (calc)   Sodium 127 (L) 135 - 146 mmol/L   Potassium 4.1 3.5 - 5.3 mmol/L   Chloride 96 (L) 98 - 110 mmol/L   CO2 23 20 - 32 mmol/L   Calcium 9.2 8.6 - 10.3 mg/dL   Total Protein 6.1 6.1 - 8.1 g/dL   Albumin 3.7 3.6 - 5.1 g/dL   Globulin 2.4 1.9 - 3.7 g/dL (calc)   AG Ratio 1.5 1.0 - 2.5 (calc)   Total Bilirubin 0.3 0.2 - 1.2 mg/dL   Alkaline phosphatase (APISO) 147 (H) 35 - 144 U/L   AST 10 10 - 35 U/L   ALT 14 9 - 46 U/L    No results found.     All questions at time of visit were answered -  patient instructed to contact office with any additional concerns or updates. ER/RTC precautions were reviewed with the patient as applicable.   Please note: manual typing as well as voice recognition software may have been used to produce this document - typos may escape review. Please contact Dr. AlSheppard Coilor any needed clarifications.

## 2021-01-13 NOTE — Patient Instructions (Addendum)
   As required by [facility] LAW to maintain opiate prescriptions:  Office visits are required every 3 months   Urine drug screen once per year AND/OR at the prescriber's discretion - updated today 01/13/21   Update controlled substance agreement once per year - updated today 01/13/21    Continue follow up with our pharmacist for diabetes medications - she and I are cooperating closely and she will let me know how you're doing!

## 2021-01-15 LAB — DRUG MONITOR, PANEL 5, SCREEN, URINE
Amphetamines: NEGATIVE ng/mL (ref <500)
Barbiturates: NEGATIVE ng/mL (ref <300)
Benzodiazepines: NEGATIVE ng/mL (ref <100)
Cocaine Metabolite: NEGATIVE ng/mL (ref <150)
Creatinine: 63.1 mg/dL
Marijuana Metabolite: NEGATIVE ng/mL (ref <20)
Methadone Metabolite: NEGATIVE ng/mL (ref <100)
Opiates: NEGATIVE ng/mL (ref <100)
Oxidant: NEGATIVE ug/mL
Oxycodone: NEGATIVE ng/mL (ref <100)
pH: 5.8 (ref 4.5–9.0)

## 2021-01-15 LAB — DM TEMPLATE

## 2021-01-18 ENCOUNTER — Telehealth: Payer: Self-pay

## 2021-01-18 DIAGNOSIS — D631 Anemia in chronic kidney disease: Secondary | ICD-10-CM | POA: Diagnosis not present

## 2021-01-18 DIAGNOSIS — I130 Hypertensive heart and chronic kidney disease with heart failure and stage 1 through stage 4 chronic kidney disease, or unspecified chronic kidney disease: Secondary | ICD-10-CM | POA: Diagnosis not present

## 2021-01-18 DIAGNOSIS — M1990 Unspecified osteoarthritis, unspecified site: Secondary | ICD-10-CM | POA: Diagnosis not present

## 2021-01-18 DIAGNOSIS — E1151 Type 2 diabetes mellitus with diabetic peripheral angiopathy without gangrene: Secondary | ICD-10-CM | POA: Diagnosis not present

## 2021-01-18 DIAGNOSIS — N183 Chronic kidney disease, stage 3 unspecified: Secondary | ICD-10-CM | POA: Diagnosis not present

## 2021-01-18 DIAGNOSIS — I5032 Chronic diastolic (congestive) heart failure: Secondary | ICD-10-CM | POA: Diagnosis not present

## 2021-01-18 DIAGNOSIS — G8929 Other chronic pain: Secondary | ICD-10-CM | POA: Diagnosis not present

## 2021-01-18 DIAGNOSIS — E1122 Type 2 diabetes mellitus with diabetic chronic kidney disease: Secondary | ICD-10-CM | POA: Diagnosis not present

## 2021-01-18 DIAGNOSIS — E1140 Type 2 diabetes mellitus with diabetic neuropathy, unspecified: Secondary | ICD-10-CM | POA: Diagnosis not present

## 2021-01-18 NOTE — Telephone Encounter (Signed)
Shelia from [facility] called and asked for a verbal order. 2 week 8, 1 week 1 for strength and balance.  Callback # [phone]

## 2021-01-20 ENCOUNTER — Telehealth: Payer: Self-pay | Admitting: Neurology

## 2021-01-20 NOTE — Telephone Encounter (Signed)
Verbal order given  

## 2021-01-20 NOTE — Telephone Encounter (Signed)
OK to give verbal order as requested.

## 2021-01-20 NOTE — Telephone Encounter (Signed)
Verbal order given, thanks

## 2021-01-20 NOTE — Telephone Encounter (Signed)
Received vm from Freda Munro, physical therapist with [facility], [phone]) wanting to get a verbal order to see patient. Please review and call Freda Munro to give VO if appropriate.

## 2021-01-22 ENCOUNTER — Other Ambulatory Visit: Payer: Self-pay

## 2021-01-22 ENCOUNTER — Ambulatory Visit: Payer: Medicare HMO | Admitting: Pharmacist

## 2021-01-22 DIAGNOSIS — I10 Essential (primary) hypertension: Secondary | ICD-10-CM

## 2021-01-22 DIAGNOSIS — E1151 Type 2 diabetes mellitus with diabetic peripheral angiopathy without gangrene: Secondary | ICD-10-CM | POA: Diagnosis not present

## 2021-01-22 DIAGNOSIS — G8929 Other chronic pain: Secondary | ICD-10-CM | POA: Diagnosis not present

## 2021-01-22 DIAGNOSIS — E1140 Type 2 diabetes mellitus with diabetic neuropathy, unspecified: Secondary | ICD-10-CM | POA: Diagnosis not present

## 2021-01-22 DIAGNOSIS — D631 Anemia in chronic kidney disease: Secondary | ICD-10-CM | POA: Diagnosis not present

## 2021-01-22 DIAGNOSIS — E118 Type 2 diabetes mellitus with unspecified complications: Secondary | ICD-10-CM

## 2021-01-22 DIAGNOSIS — E1122 Type 2 diabetes mellitus with diabetic chronic kidney disease: Secondary | ICD-10-CM | POA: Diagnosis not present

## 2021-01-22 DIAGNOSIS — E782 Mixed hyperlipidemia: Secondary | ICD-10-CM

## 2021-01-22 DIAGNOSIS — M1990 Unspecified osteoarthritis, unspecified site: Secondary | ICD-10-CM | POA: Diagnosis not present

## 2021-01-22 DIAGNOSIS — I5032 Chronic diastolic (congestive) heart failure: Secondary | ICD-10-CM | POA: Diagnosis not present

## 2021-01-22 DIAGNOSIS — I130 Hypertensive heart and chronic kidney disease with heart failure and stage 1 through stage 4 chronic kidney disease, or unspecified chronic kidney disease: Secondary | ICD-10-CM | POA: Diagnosis not present

## 2021-01-22 DIAGNOSIS — N183 Chronic kidney disease, stage 3 unspecified: Secondary | ICD-10-CM | POA: Diagnosis not present

## 2021-01-22 MED ORDER — POTASSIUM CHLORIDE CRYS ER 10 MEQ PO TBCR: 10.0000 meq | EXTENDED_RELEASE_TABLET | Freq: Every day | ORAL | 3 refills | Status: DC

## 2021-01-22 MED ORDER — CILOSTAZOL 100 MG PO TABS
100.0000 mg | ORAL_TABLET | Freq: Two times a day (BID) | ORAL | 3 refills | Status: DC
Start: 2021-01-22 — End: 2021-11-22

## 2021-01-22 NOTE — Progress Notes (Addendum)
Chronic Care Management Pharmacy Note  01/22/2021 Name:  Joseph Grimes MRN:  [id number] DOB:  August 11, 1960  Summary: addressed DM, HTN, HLD  Recommendations/Changes made from today's visit: none, patient is doing well w/toujeo  Plan: f/u face-to-face with pharmacist in 2 months  Subjective: Joseph Grimes is an 60 y.o. year old male who is a primary patient of Joseph Reeve, DO.  The CCM team was consulted for assistance with disease management and care coordination needs.    Engaged with patient by telephone for follow up visit in response to provider referral for pharmacy case management and/or care coordination services.   Consent to Services:  The patient was given information about Chronic Care Management services, agreed to services, and gave verbal consent prior to initiation of services.  Please see initial visit note for detailed documentation.   Patient Care Team: Joseph Reeve, DO as PCP - General (Osteopathic Medicine) Darius Bump, [facility] as Pharmacist (Pharmacist)   Objective:  Lab Results  Component Value Date   CREATININE 1.94 (H) 12/23/2020   CREATININE 1.82 (H) 11/26/2019   CREATININE 1.89 (H) 06/28/2019    Lab Results  Component Value Date   HGBA1C >14 12/23/2020   Last diabetic Eye exam:  Lab Results  Component Value Date/Time   HMDIABEYEEXA Retinopathy (A) 05/20/2020 12:00 AM        Component Value Date/Time   CHOL 339 (H) 12/27/2018 1156   TRIG 300 (H) 12/27/2018 1156   HDL 38 (L) 12/27/2018 1156   CHOLHDL 8.9 (H) 12/27/2018 1156   LDLCALC 250 (H) 12/27/2018 1156    Hepatic Function Latest Ref Rng & Units 12/23/2020 11/26/2019 06/28/2019  Total Protein 6.1 - 8.1 g/dL 6.1 5.9(L) 6.2  AST 10 - 35 U/L 10 9(L) 14  ALT 9 - 46 U/L '14 9 11  '$ Total Bilirubin 0.2 - 1.2 mg/dL 0.3 0.4 0.3    Lab Results  Component Value Date/Time   TSH 1.25 11/26/2019 11:14 AM    CBC Latest Ref Rng & Units 11/26/2019 12/27/2018 10/12/2018  WBC 3.8  - 10.8 Thousand/uL 11.6(H) 10.7 11.6(H)  Hemoglobin 13.2 - 17.1 g/dL 8.5(L) 14.4 14.7  Hematocrit 38.5 - 50.0 % 26.7(L) 44.1 43.9  Platelets 140 - 400 Thousand/uL 397 331 293     Social History   Tobacco Use  Smoking Status Every Day   Packs/day: 0.50   Pack years: 0.00   Types: Cigarettes  Smokeless Tobacco Never  Tobacco Comments   refused   BP Readings from Last 3 Encounters:  01/13/21 123/69  07/14/20 (!) 142/74  11/26/19 (!) 108/59   Pulse Readings from Last 3 Encounters:  01/13/21 85  07/14/20 84  11/26/19 99   Wt Readings from Last 3 Encounters:  01/13/21 137 lb 0.6 oz (62.2 kg)  07/14/20 138 lb 1.9 oz (62.7 kg)  11/26/19 158 lb (71.7 kg)    Assessment: Review of patient past medical history, allergies, medications, health status, including review of consultants reports, laboratory and other test data, was performed as part of comprehensive evaluation and provision of chronic care management services.   SDOH:  (Social Determinants of Health) assessments and interventions performed:    CCM Care Plan  No Known Allergies  Medications Reviewed Today     Reviewed by Darius Bump, [facility] (Pharmacist) on 01/22/21 at 1107  Med List Status: <None>   Medication Order Taking? Sig Documenting Provider Last Dose Status Informant  ACCU-CHEK AVIVA PLUS test strip [id number] Yes  [provider] Taking Active   Accu-Chek Softclix Lancets lancets [id number] Yes  [provider] Taking Active   aspirin 81 MG tablet [id number] Yes Take 1 tablet (81 mg total) by mouth daily. Joseph Reeve, DO Taking Active   cilostazol (PLETAL) 100 MG tablet [id number] Yes Take 1 tablet (100 mg total) by mouth 2 (two) times daily. Joseph Reeve, DO Taking Active   FEROSUL 325 [phone] Fe) MG tablet [id number] Yes TAKE 1 TABLET TWICE DAILY WITH MEALS Joseph Reeve, DO Taking Active            Med Note Dorene Ar Dec 23, 2020  3:21 PM) Takes 1 tablet daily.   furosemide (LASIX) 20 MG tablet [id number] Yes TAKE 1 TABLET EVERY DAY (OVERDUE FOR VISIT/LABS) Joseph Reeve, DO Taking Active   insulin glargine, 2 Unit Dial, (TOUJEO MAX SOLOSTAR) 300 UNIT/ML Solostar Pen [id number] Yes Inject 16 Units into the skin at bedtime. Increase by 4 units every 4 days until fasting (morning) blood sugar is <150, then stay at that dose. Joseph Reeve, DO Taking Active            Med Note Darius Bump   Fri Jan 22, 2021 11:07 AM) Taking 36 units as of 01/22/21  Insulin Pen Needle 29G X 12.7MM MISC [id number] Yes 100 each by Does not apply route daily. Please dispense qty, brand, appropriate pen sizing for toujeo solostar Joseph Reeve, DO Taking Active   Oxycodone HCl 10 MG TABS [id number] Yes Take 0.5-1 tablets (5-10 mg total) by mouth every 6 (six) hours as needed. Joseph Reeve, DO Taking Active   pantoprazole (PROTONIX) 40 MG tablet [id number] Yes TAKE 1 TABLET EVERY DAY (OVERDUE FOR VISIT/LABS) Joseph Reeve, DO Taking Active   potassium chloride (KLOR-CON) 10 MEQ tablet [id number] Yes Take 1 tablet (10 mEq total) by mouth daily. Joseph Reeve, DO Taking Active   rosuvastatin (CRESTOR) 40 MG tablet [id number] Yes TAKE 1 TABLET EVERY DAY (NEED MD APPOINTMENT) Joseph Reeve, DO Taking Active             Patient Active Problem List   Diagnosis Date Noted   Pilonidal cyst 11/25/2019   AKI (acute kidney injury) ([place]) 11/03/2019   Hyponatremia 11/03/2019   Sepsis ([place]) 11/03/2019   Weakness 11/03/2019   Osteomyelitis of finger of left hand ([place])    Vitamin D deficiency 12/28/2018   Erectile dysfunction 12/28/2018   PVC (premature ventricular contraction) 12/27/2018   Essential hypertension 10/11/2018   Coronary artery disease 10/11/2018   Peripheral vascular disease ([place]) 10/11/2018   Mixed hyperlipidemia 10/11/2018   Osteoarthritis 10/11/2018   CKD (chronic kidney disease) stage 3, GFR 30-59 ml/min (HCC) 10/11/2018   History  of anemia 10/11/2018   Other chronic pain 06/08/2018   Vaccine refused by patient 06/07/2018   Abnormal stress test 04/10/2018   Non-traumatic rhabdomyolysis 02/01/2018   Hypokalemia 02/01/2018   Hypophosphatemia 02/01/2018   Perianal abscess 10/16/2017   Nicotine dependence with current use 10/12/2017   Perirectal abscess 10/12/2017   Drug-induced constipation 10/12/2017   GI bleed 09/15/2017   Elevated troponin 06/15/2017   Chronic diastolic heart failure ([place]) 06/15/2017   Normocytic anemia 06/15/2017   Type 2 diabetes mellitus with diabetic neuropathy, with long-term current use of insulin ([place]) 04/24/2017   History of cerebrovascular accident 09/20/2016   Bone lesion 09/29/2014   Finger pain, right 09/29/2014   S/P coronary artery stent placement 12/20/2012   Old myocardial infarction 10/27/2010    Immunization History  Administered Date(s) Administered   Janssen (J&J) SARS-COV-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 12/04/2019   PFIZER(Purple Top)SARS-COV-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 06/10/2020    Conditions to be addressed/monitored: HTN, HLD, and DMII  Care Plan : Medication Management  Updates made by Darius Bump, [place] since 01/22/2021 12:00 AM     Problem: Diabetes, HTN, HLD   Priority: High     Long-Range Goal: Disease Progression Prevention   Start Date: 12/23/2020  Recent Progress: On track  Priority: High  Note:   Current Barriers:  Unable to independently afford treatment regimen Unable to achieve control of diabetes   Pharmacist Clinical Goal(s):  Over the next 90 days, patient will verbalize ability to afford treatment regimen achieve adherence to monitoring guidelines and medication adherence to achieve therapeutic efficacy adhere to prescribed medication regimen as evidenced by fill history & verbal confirmation of patient assistance status  through collaboration with PharmD and provider.   Interventions: 1:1 collaboration with Joseph Reeve, DO regarding development and update  of comprehensive plan of care as evidenced by provider attestation and co-signature Inter-disciplinary care team collaboration (see longitudinal plan of care) Comprehensive medication review performed; medication list updated in electronic medical record  Diabetes:  Uncontrolled: toujeo 36 units, increasing to 40 units 01/23/21 per titration  Current glucose readings: 203 in AM fasting, 320s for PM. Advised patient to write down numbers & will discuss at next f/u appt  Denies hypoglycemic/hyperglycemic symptoms  Current meal patterns: breakfast: eggs, hashbrown, sausage or bacon, corn beef hash; lunch: skips lunch; dinner: varies - taco, burrito, spaghetti, cheeseburgers, pork sausage; drinks: Mt Dew (2-3 per day)  Current exercise: limited by residual stroke deficits but does shoulder rotations  Recommended goal of continuing green vegetable on plate 3x per week, NOW ADDING the goal of 2 MtDews per day, maximum, aiming for 1 per day. Assessed patient finances. Fixed income, medicare without part D, initiated [employer] patient assistance for [employer] ,   Patient assistance for toujeo ([employer] PAP) approved 12/28/20, medication delivered to primary care office 01/06/21, 90DS will continue to be delivered. Recertification of application for [id number] year can be completed July 22, 2021.   Hypertension:  Uncontrolled; current treatment: furosemide '20mg'$  daily  Current home readings: not checking at home  Denies hypotensive/hypertensive symptoms  Recommended purchase of BP measurement device and checking at home, educated on proper technique of checking BP Assessed medication therapy, to be addressed at subsequent visits  (consider lisinopril '5mg'$ )  Hyperlipidemia:  Controlled, current treatment: Rosuvastatin '40mg'$ ;   Medications previously tried: N/A  Educated on multiple benefits of statin therapy Recommended continue current regimen  Patient Goals/Self-Care Activities Over the next 90 days, patient  will:  take medications as prescribed, check glucose 2x day (morning and afternoon), document, and provide at future appointments, collaborate with provider on medication access solutions, and engage in dietary modifications by choosing a green vegetable at least 3x per week and aim to decrease to 1-2 Mt Dew per day at most  Follow Up Plan: Face-to-face follow up appointment with care management team member scheduled for:  2 months      Medication Assistance:  Patient assistance for toujeo ([employer] PAP) approved 12/28/20, medication delivered to primary care office 01/06/21, 90DS will continue to be delivered. Recertification of application for 99991111 year can be completed July 22, 2021.   Patient's preferred pharmacy is:  [facility] ([facility] - [place], [place] [address] Phone: [phone] Fax: [phone]  [id number] - [place], [place] - [address] [place] [address] Phone: [phone] Fax: [phone]   Follow Up:  Patient agrees to Care Plan and Follow-up.  Plan: Face to Face appointment with care management team member scheduled for: 2 months Darius Bump

## 2021-01-22 NOTE — Telephone Encounter (Signed)
[facility] ([facility] requesting new rxs for pt:  True Metrix blood glucose meter [employer] True metrix test strip TruePlus 33G lancets BD single use swab True metrix level 1 ctrl soln

## 2021-01-25 MED ORDER — BLOOD GLUCOSE METER KIT: PACK | 0 refills | Status: AC

## 2021-01-26 DIAGNOSIS — I5032 Chronic diastolic (congestive) heart failure: Secondary | ICD-10-CM | POA: Diagnosis not present

## 2021-01-26 DIAGNOSIS — E1140 Type 2 diabetes mellitus with diabetic neuropathy, unspecified: Secondary | ICD-10-CM | POA: Diagnosis not present

## 2021-01-26 DIAGNOSIS — I130 Hypertensive heart and chronic kidney disease with heart failure and stage 1 through stage 4 chronic kidney disease, or unspecified chronic kidney disease: Secondary | ICD-10-CM | POA: Diagnosis not present

## 2021-01-26 DIAGNOSIS — D631 Anemia in chronic kidney disease: Secondary | ICD-10-CM | POA: Diagnosis not present

## 2021-01-26 DIAGNOSIS — M1990 Unspecified osteoarthritis, unspecified site: Secondary | ICD-10-CM | POA: Diagnosis not present

## 2021-01-26 DIAGNOSIS — E1151 Type 2 diabetes mellitus with diabetic peripheral angiopathy without gangrene: Secondary | ICD-10-CM | POA: Diagnosis not present

## 2021-01-26 DIAGNOSIS — N183 Chronic kidney disease, stage 3 unspecified: Secondary | ICD-10-CM | POA: Diagnosis not present

## 2021-01-26 DIAGNOSIS — E1122 Type 2 diabetes mellitus with diabetic chronic kidney disease: Secondary | ICD-10-CM | POA: Diagnosis not present

## 2021-01-26 DIAGNOSIS — G8929 Other chronic pain: Secondary | ICD-10-CM | POA: Diagnosis not present

## 2021-01-28 DIAGNOSIS — I130 Hypertensive heart and chronic kidney disease with heart failure and stage 1 through stage 4 chronic kidney disease, or unspecified chronic kidney disease: Secondary | ICD-10-CM | POA: Diagnosis not present

## 2021-01-28 DIAGNOSIS — G8929 Other chronic pain: Secondary | ICD-10-CM | POA: Diagnosis not present

## 2021-01-28 DIAGNOSIS — M1990 Unspecified osteoarthritis, unspecified site: Secondary | ICD-10-CM | POA: Diagnosis not present

## 2021-01-28 DIAGNOSIS — I5032 Chronic diastolic (congestive) heart failure: Secondary | ICD-10-CM | POA: Diagnosis not present

## 2021-01-28 DIAGNOSIS — E1122 Type 2 diabetes mellitus with diabetic chronic kidney disease: Secondary | ICD-10-CM | POA: Diagnosis not present

## 2021-01-28 DIAGNOSIS — D631 Anemia in chronic kidney disease: Secondary | ICD-10-CM | POA: Diagnosis not present

## 2021-01-28 DIAGNOSIS — E1151 Type 2 diabetes mellitus with diabetic peripheral angiopathy without gangrene: Secondary | ICD-10-CM | POA: Diagnosis not present

## 2021-01-28 DIAGNOSIS — E1140 Type 2 diabetes mellitus with diabetic neuropathy, unspecified: Secondary | ICD-10-CM | POA: Diagnosis not present

## 2021-01-28 DIAGNOSIS — N183 Chronic kidney disease, stage 3 unspecified: Secondary | ICD-10-CM | POA: Diagnosis not present

## 2021-02-01 DIAGNOSIS — E1122 Type 2 diabetes mellitus with diabetic chronic kidney disease: Secondary | ICD-10-CM | POA: Diagnosis not present

## 2021-02-01 DIAGNOSIS — I130 Hypertensive heart and chronic kidney disease with heart failure and stage 1 through stage 4 chronic kidney disease, or unspecified chronic kidney disease: Secondary | ICD-10-CM | POA: Diagnosis not present

## 2021-02-01 DIAGNOSIS — G8929 Other chronic pain: Secondary | ICD-10-CM | POA: Diagnosis not present

## 2021-02-01 DIAGNOSIS — M1990 Unspecified osteoarthritis, unspecified site: Secondary | ICD-10-CM | POA: Diagnosis not present

## 2021-02-01 DIAGNOSIS — D631 Anemia in chronic kidney disease: Secondary | ICD-10-CM | POA: Diagnosis not present

## 2021-02-01 DIAGNOSIS — E1151 Type 2 diabetes mellitus with diabetic peripheral angiopathy without gangrene: Secondary | ICD-10-CM | POA: Diagnosis not present

## 2021-02-01 DIAGNOSIS — I5032 Chronic diastolic (congestive) heart failure: Secondary | ICD-10-CM | POA: Diagnosis not present

## 2021-02-01 DIAGNOSIS — E1140 Type 2 diabetes mellitus with diabetic neuropathy, unspecified: Secondary | ICD-10-CM | POA: Diagnosis not present

## 2021-02-01 DIAGNOSIS — N183 Chronic kidney disease, stage 3 unspecified: Secondary | ICD-10-CM | POA: Diagnosis not present

## 2021-02-03 DIAGNOSIS — M1990 Unspecified osteoarthritis, unspecified site: Secondary | ICD-10-CM | POA: Diagnosis not present

## 2021-02-03 DIAGNOSIS — D631 Anemia in chronic kidney disease: Secondary | ICD-10-CM | POA: Diagnosis not present

## 2021-02-03 DIAGNOSIS — I5032 Chronic diastolic (congestive) heart failure: Secondary | ICD-10-CM | POA: Diagnosis not present

## 2021-02-03 DIAGNOSIS — I130 Hypertensive heart and chronic kidney disease with heart failure and stage 1 through stage 4 chronic kidney disease, or unspecified chronic kidney disease: Secondary | ICD-10-CM | POA: Diagnosis not present

## 2021-02-03 DIAGNOSIS — E1151 Type 2 diabetes mellitus with diabetic peripheral angiopathy without gangrene: Secondary | ICD-10-CM | POA: Diagnosis not present

## 2021-02-03 DIAGNOSIS — G8929 Other chronic pain: Secondary | ICD-10-CM | POA: Diagnosis not present

## 2021-02-03 DIAGNOSIS — E1122 Type 2 diabetes mellitus with diabetic chronic kidney disease: Secondary | ICD-10-CM | POA: Diagnosis not present

## 2021-02-03 DIAGNOSIS — E1140 Type 2 diabetes mellitus with diabetic neuropathy, unspecified: Secondary | ICD-10-CM | POA: Diagnosis not present

## 2021-02-03 DIAGNOSIS — N183 Chronic kidney disease, stage 3 unspecified: Secondary | ICD-10-CM | POA: Diagnosis not present

## 2021-02-04 ENCOUNTER — Other Ambulatory Visit: Payer: Self-pay | Admitting: Osteopathic Medicine

## 2021-02-04 ENCOUNTER — Other Ambulatory Visit: Payer: Self-pay

## 2021-02-04 DIAGNOSIS — M1990 Unspecified osteoarthritis, unspecified site: Secondary | ICD-10-CM

## 2021-02-04 NOTE — Telephone Encounter (Signed)
Patient called requesting med refill for oxycodone. Pls send to [facility]. Rx pended.

## 2021-02-05 MED ORDER — OXYCODONE HCL 10 MG PO TABS: 5.0000 mg | ORAL_TABLET | Freq: Four times a day (QID) | ORAL | 0 refills | Status: AC | PRN

## 2021-02-09 DIAGNOSIS — I5032 Chronic diastolic (congestive) heart failure: Secondary | ICD-10-CM | POA: Diagnosis not present

## 2021-02-09 DIAGNOSIS — N183 Chronic kidney disease, stage 3 unspecified: Secondary | ICD-10-CM | POA: Diagnosis not present

## 2021-02-09 DIAGNOSIS — M1990 Unspecified osteoarthritis, unspecified site: Secondary | ICD-10-CM | POA: Diagnosis not present

## 2021-02-09 DIAGNOSIS — D631 Anemia in chronic kidney disease: Secondary | ICD-10-CM | POA: Diagnosis not present

## 2021-02-09 DIAGNOSIS — E1151 Type 2 diabetes mellitus with diabetic peripheral angiopathy without gangrene: Secondary | ICD-10-CM | POA: Diagnosis not present

## 2021-02-09 DIAGNOSIS — I130 Hypertensive heart and chronic kidney disease with heart failure and stage 1 through stage 4 chronic kidney disease, or unspecified chronic kidney disease: Secondary | ICD-10-CM | POA: Diagnosis not present

## 2021-02-09 DIAGNOSIS — G8929 Other chronic pain: Secondary | ICD-10-CM | POA: Diagnosis not present

## 2021-02-09 DIAGNOSIS — E1140 Type 2 diabetes mellitus with diabetic neuropathy, unspecified: Secondary | ICD-10-CM | POA: Diagnosis not present

## 2021-02-09 DIAGNOSIS — E1122 Type 2 diabetes mellitus with diabetic chronic kidney disease: Secondary | ICD-10-CM | POA: Diagnosis not present

## 2021-02-11 DIAGNOSIS — G8929 Other chronic pain: Secondary | ICD-10-CM | POA: Diagnosis not present

## 2021-02-11 DIAGNOSIS — N183 Chronic kidney disease, stage 3 unspecified: Secondary | ICD-10-CM | POA: Diagnosis not present

## 2021-02-11 DIAGNOSIS — I5032 Chronic diastolic (congestive) heart failure: Secondary | ICD-10-CM | POA: Diagnosis not present

## 2021-02-11 DIAGNOSIS — E1122 Type 2 diabetes mellitus with diabetic chronic kidney disease: Secondary | ICD-10-CM | POA: Diagnosis not present

## 2021-02-11 DIAGNOSIS — E1151 Type 2 diabetes mellitus with diabetic peripheral angiopathy without gangrene: Secondary | ICD-10-CM | POA: Diagnosis not present

## 2021-02-11 DIAGNOSIS — I130 Hypertensive heart and chronic kidney disease with heart failure and stage 1 through stage 4 chronic kidney disease, or unspecified chronic kidney disease: Secondary | ICD-10-CM | POA: Diagnosis not present

## 2021-02-11 DIAGNOSIS — E1140 Type 2 diabetes mellitus with diabetic neuropathy, unspecified: Secondary | ICD-10-CM | POA: Diagnosis not present

## 2021-02-11 DIAGNOSIS — D631 Anemia in chronic kidney disease: Secondary | ICD-10-CM | POA: Diagnosis not present

## 2021-02-11 DIAGNOSIS — M1990 Unspecified osteoarthritis, unspecified site: Secondary | ICD-10-CM | POA: Diagnosis not present

## 2021-02-15 DIAGNOSIS — E1140 Type 2 diabetes mellitus with diabetic neuropathy, unspecified: Secondary | ICD-10-CM | POA: Diagnosis not present

## 2021-02-15 DIAGNOSIS — M1990 Unspecified osteoarthritis, unspecified site: Secondary | ICD-10-CM | POA: Diagnosis not present

## 2021-02-15 DIAGNOSIS — I130 Hypertensive heart and chronic kidney disease with heart failure and stage 1 through stage 4 chronic kidney disease, or unspecified chronic kidney disease: Secondary | ICD-10-CM | POA: Diagnosis not present

## 2021-02-15 DIAGNOSIS — E1122 Type 2 diabetes mellitus with diabetic chronic kidney disease: Secondary | ICD-10-CM | POA: Diagnosis not present

## 2021-02-15 DIAGNOSIS — G8929 Other chronic pain: Secondary | ICD-10-CM | POA: Diagnosis not present

## 2021-02-15 DIAGNOSIS — D631 Anemia in chronic kidney disease: Secondary | ICD-10-CM | POA: Diagnosis not present

## 2021-02-15 DIAGNOSIS — E1151 Type 2 diabetes mellitus with diabetic peripheral angiopathy without gangrene: Secondary | ICD-10-CM | POA: Diagnosis not present

## 2021-02-15 DIAGNOSIS — N183 Chronic kidney disease, stage 3 unspecified: Secondary | ICD-10-CM | POA: Diagnosis not present

## 2021-02-15 DIAGNOSIS — I5032 Chronic diastolic (congestive) heart failure: Secondary | ICD-10-CM | POA: Diagnosis not present

## 2021-02-17 DIAGNOSIS — E1140 Type 2 diabetes mellitus with diabetic neuropathy, unspecified: Secondary | ICD-10-CM | POA: Diagnosis not present

## 2021-02-17 DIAGNOSIS — I130 Hypertensive heart and chronic kidney disease with heart failure and stage 1 through stage 4 chronic kidney disease, or unspecified chronic kidney disease: Secondary | ICD-10-CM | POA: Diagnosis not present

## 2021-02-17 DIAGNOSIS — G8929 Other chronic pain: Secondary | ICD-10-CM | POA: Diagnosis not present

## 2021-02-17 DIAGNOSIS — E1151 Type 2 diabetes mellitus with diabetic peripheral angiopathy without gangrene: Secondary | ICD-10-CM | POA: Diagnosis not present

## 2021-02-17 DIAGNOSIS — E1122 Type 2 diabetes mellitus with diabetic chronic kidney disease: Secondary | ICD-10-CM | POA: Diagnosis not present

## 2021-02-17 DIAGNOSIS — D631 Anemia in chronic kidney disease: Secondary | ICD-10-CM | POA: Diagnosis not present

## 2021-02-17 DIAGNOSIS — I5032 Chronic diastolic (congestive) heart failure: Secondary | ICD-10-CM | POA: Diagnosis not present

## 2021-02-17 DIAGNOSIS — N183 Chronic kidney disease, stage 3 unspecified: Secondary | ICD-10-CM | POA: Diagnosis not present

## 2021-02-17 DIAGNOSIS — M1990 Unspecified osteoarthritis, unspecified site: Secondary | ICD-10-CM | POA: Diagnosis not present

## 2021-02-22 DIAGNOSIS — N183 Chronic kidney disease, stage 3 unspecified: Secondary | ICD-10-CM | POA: Diagnosis not present

## 2021-02-22 DIAGNOSIS — D631 Anemia in chronic kidney disease: Secondary | ICD-10-CM | POA: Diagnosis not present

## 2021-02-22 DIAGNOSIS — I130 Hypertensive heart and chronic kidney disease with heart failure and stage 1 through stage 4 chronic kidney disease, or unspecified chronic kidney disease: Secondary | ICD-10-CM | POA: Diagnosis not present

## 2021-02-22 DIAGNOSIS — G8929 Other chronic pain: Secondary | ICD-10-CM | POA: Diagnosis not present

## 2021-02-22 DIAGNOSIS — I5032 Chronic diastolic (congestive) heart failure: Secondary | ICD-10-CM | POA: Diagnosis not present

## 2021-02-22 DIAGNOSIS — E1140 Type 2 diabetes mellitus with diabetic neuropathy, unspecified: Secondary | ICD-10-CM | POA: Diagnosis not present

## 2021-02-22 DIAGNOSIS — M1990 Unspecified osteoarthritis, unspecified site: Secondary | ICD-10-CM | POA: Diagnosis not present

## 2021-02-22 DIAGNOSIS — E1122 Type 2 diabetes mellitus with diabetic chronic kidney disease: Secondary | ICD-10-CM | POA: Diagnosis not present

## 2021-02-22 DIAGNOSIS — E1151 Type 2 diabetes mellitus with diabetic peripheral angiopathy without gangrene: Secondary | ICD-10-CM | POA: Diagnosis not present

## 2021-02-24 DIAGNOSIS — M1990 Unspecified osteoarthritis, unspecified site: Secondary | ICD-10-CM | POA: Diagnosis not present

## 2021-02-24 DIAGNOSIS — G8929 Other chronic pain: Secondary | ICD-10-CM | POA: Diagnosis not present

## 2021-02-24 DIAGNOSIS — I5032 Chronic diastolic (congestive) heart failure: Secondary | ICD-10-CM | POA: Diagnosis not present

## 2021-02-24 DIAGNOSIS — N183 Chronic kidney disease, stage 3 unspecified: Secondary | ICD-10-CM | POA: Diagnosis not present

## 2021-02-24 DIAGNOSIS — E1140 Type 2 diabetes mellitus with diabetic neuropathy, unspecified: Secondary | ICD-10-CM | POA: Diagnosis not present

## 2021-02-24 DIAGNOSIS — E1151 Type 2 diabetes mellitus with diabetic peripheral angiopathy without gangrene: Secondary | ICD-10-CM | POA: Diagnosis not present

## 2021-02-24 DIAGNOSIS — I130 Hypertensive heart and chronic kidney disease with heart failure and stage 1 through stage 4 chronic kidney disease, or unspecified chronic kidney disease: Secondary | ICD-10-CM | POA: Diagnosis not present

## 2021-02-24 DIAGNOSIS — D631 Anemia in chronic kidney disease: Secondary | ICD-10-CM | POA: Diagnosis not present

## 2021-02-24 DIAGNOSIS — E1122 Type 2 diabetes mellitus with diabetic chronic kidney disease: Secondary | ICD-10-CM | POA: Diagnosis not present

## 2021-03-01 DIAGNOSIS — G8929 Other chronic pain: Secondary | ICD-10-CM | POA: Diagnosis not present

## 2021-03-01 DIAGNOSIS — E1140 Type 2 diabetes mellitus with diabetic neuropathy, unspecified: Secondary | ICD-10-CM | POA: Diagnosis not present

## 2021-03-01 DIAGNOSIS — E1122 Type 2 diabetes mellitus with diabetic chronic kidney disease: Secondary | ICD-10-CM | POA: Diagnosis not present

## 2021-03-01 DIAGNOSIS — E1151 Type 2 diabetes mellitus with diabetic peripheral angiopathy without gangrene: Secondary | ICD-10-CM | POA: Diagnosis not present

## 2021-03-01 DIAGNOSIS — I5032 Chronic diastolic (congestive) heart failure: Secondary | ICD-10-CM | POA: Diagnosis not present

## 2021-03-01 DIAGNOSIS — D631 Anemia in chronic kidney disease: Secondary | ICD-10-CM | POA: Diagnosis not present

## 2021-03-01 DIAGNOSIS — N183 Chronic kidney disease, stage 3 unspecified: Secondary | ICD-10-CM | POA: Diagnosis not present

## 2021-03-01 DIAGNOSIS — I130 Hypertensive heart and chronic kidney disease with heart failure and stage 1 through stage 4 chronic kidney disease, or unspecified chronic kidney disease: Secondary | ICD-10-CM | POA: Diagnosis not present

## 2021-03-01 DIAGNOSIS — M1990 Unspecified osteoarthritis, unspecified site: Secondary | ICD-10-CM | POA: Diagnosis not present

## 2021-03-03 DIAGNOSIS — E1151 Type 2 diabetes mellitus with diabetic peripheral angiopathy without gangrene: Secondary | ICD-10-CM | POA: Diagnosis not present

## 2021-03-03 DIAGNOSIS — E1122 Type 2 diabetes mellitus with diabetic chronic kidney disease: Secondary | ICD-10-CM | POA: Diagnosis not present

## 2021-03-03 DIAGNOSIS — N183 Chronic kidney disease, stage 3 unspecified: Secondary | ICD-10-CM | POA: Diagnosis not present

## 2021-03-03 DIAGNOSIS — E1140 Type 2 diabetes mellitus with diabetic neuropathy, unspecified: Secondary | ICD-10-CM | POA: Diagnosis not present

## 2021-03-03 DIAGNOSIS — I5032 Chronic diastolic (congestive) heart failure: Secondary | ICD-10-CM | POA: Diagnosis not present

## 2021-03-03 DIAGNOSIS — I130 Hypertensive heart and chronic kidney disease with heart failure and stage 1 through stage 4 chronic kidney disease, or unspecified chronic kidney disease: Secondary | ICD-10-CM | POA: Diagnosis not present

## 2021-03-03 DIAGNOSIS — G8929 Other chronic pain: Secondary | ICD-10-CM | POA: Diagnosis not present

## 2021-03-03 DIAGNOSIS — M1990 Unspecified osteoarthritis, unspecified site: Secondary | ICD-10-CM | POA: Diagnosis not present

## 2021-03-03 DIAGNOSIS — D631 Anemia in chronic kidney disease: Secondary | ICD-10-CM | POA: Diagnosis not present

## 2021-03-10 ENCOUNTER — Telehealth: Payer: Self-pay

## 2021-03-10 ENCOUNTER — Other Ambulatory Visit: Payer: Self-pay | Admitting: Osteopathic Medicine

## 2021-03-10 ENCOUNTER — Telehealth: Payer: Self-pay | Admitting: Osteopathic Medicine

## 2021-03-10 ENCOUNTER — Ambulatory Visit: Payer: Medicare HMO

## 2021-03-10 DIAGNOSIS — I5032 Chronic diastolic (congestive) heart failure: Secondary | ICD-10-CM | POA: Diagnosis not present

## 2021-03-10 DIAGNOSIS — G8929 Other chronic pain: Secondary | ICD-10-CM | POA: Diagnosis not present

## 2021-03-10 DIAGNOSIS — M1990 Unspecified osteoarthritis, unspecified site: Secondary | ICD-10-CM | POA: Diagnosis not present

## 2021-03-10 DIAGNOSIS — E1151 Type 2 diabetes mellitus with diabetic peripheral angiopathy without gangrene: Secondary | ICD-10-CM | POA: Diagnosis not present

## 2021-03-10 DIAGNOSIS — D631 Anemia in chronic kidney disease: Secondary | ICD-10-CM | POA: Diagnosis not present

## 2021-03-10 DIAGNOSIS — E1122 Type 2 diabetes mellitus with diabetic chronic kidney disease: Secondary | ICD-10-CM | POA: Diagnosis not present

## 2021-03-10 DIAGNOSIS — I130 Hypertensive heart and chronic kidney disease with heart failure and stage 1 through stage 4 chronic kidney disease, or unspecified chronic kidney disease: Secondary | ICD-10-CM | POA: Diagnosis not present

## 2021-03-10 DIAGNOSIS — N183 Chronic kidney disease, stage 3 unspecified: Secondary | ICD-10-CM | POA: Diagnosis not present

## 2021-03-10 DIAGNOSIS — E1140 Type 2 diabetes mellitus with diabetic neuropathy, unspecified: Secondary | ICD-10-CM | POA: Diagnosis not present

## 2021-03-10 NOTE — Telephone Encounter (Signed)
Pt called requesting med refills for oxycodone. Pls send to [facility]. Preferred pharmacy updated in patient's chart.

## 2021-03-10 NOTE — Telephone Encounter (Signed)
Patient wanted to cancel and reschedule this appointment.

## 2021-03-11 DIAGNOSIS — K5900 Constipation, unspecified: Secondary | ICD-10-CM | POA: Diagnosis not present

## 2021-03-11 DIAGNOSIS — I2510 Atherosclerotic heart disease of native coronary artery without angina pectoris: Secondary | ICD-10-CM | POA: Diagnosis not present

## 2021-03-11 DIAGNOSIS — N189 Chronic kidney disease, unspecified: Secondary | ICD-10-CM | POA: Diagnosis not present

## 2021-03-11 DIAGNOSIS — I700 Atherosclerosis of aorta: Secondary | ICD-10-CM | POA: Diagnosis not present

## 2021-03-11 DIAGNOSIS — K8020 Calculus of gallbladder without cholecystitis without obstruction: Secondary | ICD-10-CM | POA: Diagnosis not present

## 2021-03-11 DIAGNOSIS — I129 Hypertensive chronic kidney disease with stage 1 through stage 4 chronic kidney disease, or unspecified chronic kidney disease: Secondary | ICD-10-CM | POA: Diagnosis not present

## 2021-03-11 DIAGNOSIS — R1032 Left lower quadrant pain: Secondary | ICD-10-CM | POA: Diagnosis not present

## 2021-03-11 DIAGNOSIS — R1084 Generalized abdominal pain: Secondary | ICD-10-CM | POA: Diagnosis not present

## 2021-03-11 DIAGNOSIS — E1122 Type 2 diabetes mellitus with diabetic chronic kidney disease: Secondary | ICD-10-CM | POA: Diagnosis not present

## 2021-03-11 DIAGNOSIS — E1165 Type 2 diabetes mellitus with hyperglycemia: Secondary | ICD-10-CM | POA: Diagnosis not present

## 2021-03-11 DIAGNOSIS — N200 Calculus of kidney: Secondary | ICD-10-CM | POA: Diagnosis not present

## 2021-03-11 MED ORDER — OXYCODONE HCL 10 MG PO TABS: 5.0000 mg | ORAL_TABLET | Freq: Four times a day (QID) | ORAL | 0 refills | Status: DC | PRN

## 2021-03-11 NOTE — Telephone Encounter (Signed)
Sent!

## 2021-03-18 DIAGNOSIS — I130 Hypertensive heart and chronic kidney disease with heart failure and stage 1 through stage 4 chronic kidney disease, or unspecified chronic kidney disease: Secondary | ICD-10-CM | POA: Diagnosis not present

## 2021-03-18 DIAGNOSIS — D631 Anemia in chronic kidney disease: Secondary | ICD-10-CM | POA: Diagnosis not present

## 2021-03-18 DIAGNOSIS — E1140 Type 2 diabetes mellitus with diabetic neuropathy, unspecified: Secondary | ICD-10-CM | POA: Diagnosis not present

## 2021-03-18 DIAGNOSIS — I5032 Chronic diastolic (congestive) heart failure: Secondary | ICD-10-CM | POA: Diagnosis not present

## 2021-03-18 DIAGNOSIS — M1990 Unspecified osteoarthritis, unspecified site: Secondary | ICD-10-CM | POA: Diagnosis not present

## 2021-03-18 DIAGNOSIS — E1151 Type 2 diabetes mellitus with diabetic peripheral angiopathy without gangrene: Secondary | ICD-10-CM | POA: Diagnosis not present

## 2021-03-18 DIAGNOSIS — E1122 Type 2 diabetes mellitus with diabetic chronic kidney disease: Secondary | ICD-10-CM | POA: Diagnosis not present

## 2021-03-18 DIAGNOSIS — N183 Chronic kidney disease, stage 3 unspecified: Secondary | ICD-10-CM | POA: Diagnosis not present

## 2021-03-18 DIAGNOSIS — G8929 Other chronic pain: Secondary | ICD-10-CM | POA: Diagnosis not present

## 2021-03-24 ENCOUNTER — Ambulatory Visit (INDEPENDENT_AMBULATORY_CARE_PROVIDER_SITE_OTHER): Payer: Medicare HMO | Admitting: Pharmacist

## 2021-03-24 VITALS — BP 116/68 | HR 97

## 2021-03-24 DIAGNOSIS — E118 Type 2 diabetes mellitus with unspecified complications: Secondary | ICD-10-CM | POA: Diagnosis not present

## 2021-03-24 DIAGNOSIS — I10 Essential (primary) hypertension: Secondary | ICD-10-CM | POA: Diagnosis not present

## 2021-03-24 DIAGNOSIS — E782 Mixed hyperlipidemia: Secondary | ICD-10-CM | POA: Diagnosis not present

## 2021-03-24 LAB — POCT GLYCOSYLATED HEMOGLOBIN (HGB A1C): HbA1c POC (<> result, manual entry): 8.7 % (ref 4.0–5.6)

## 2021-03-24 MED ORDER — INSULIN PEN NEEDLE 29G X 12.7MM MISC: 100.0000 | Freq: Every day | 2 refills | Status: DC

## 2021-03-24 NOTE — Patient Instructions (Signed)
Visit Information  PATIENT GOALS:  Goals Addressed             This Visit's Progress    Medication Management       Patient Goals/Self-Care Activities Over the next 90 days, patient will:  take medications as prescribed, check glucose 2x day (morning and afternoon), document, and provide at future appointments, collaborate with provider on medication access solutions, and engage in dietary modifications by choosing a green vegetable at least 3x per week and aim to decrease to 1-2 Mt Dew per day at most  Follow Up Plan: Face-to-face follow up appointment with care management team member scheduled for:  1 month        Patient verbalizes understanding of instructions provided today and agrees to view in [place].   Telephone follow up appointment with care management team member scheduled for: 1 month  Darius Bump

## 2021-03-24 NOTE — Progress Notes (Signed)
Chronic Care Management Pharmacy Note  03/24/2021 Name:  Joseph Grimes MRN:  [id number] DOB:  [birth year]  Summary: addressed HTN, HLD, DM. A1c down from >14 (undetectable) to 8.7! This is over the course of 3 months with initiating toujeo via patient assistance program.  Recommendations/Changes made from today's visit:  Continue current regimen, advised patient to keep checking BG, and if having more readings <70 for AM fasting, to decrease his dose from toujeo 42 units daily to 40 units daily.  Plan: f/u with pharmacist in 1 month  Subjective: Joseph Grimes is an 60 y.o. year old male who is a primary patient of Joseph Reeve, DO.  The CCM team was consulted for assistance with disease management and care coordination needs.    Engaged with patient face to face for follow up visit in response to provider referral for pharmacy case management and/or care coordination services.   Consent to Services:  The patient was given information about Chronic Care Management services, agreed to services, and gave verbal consent prior to initiation of services.  Please see initial visit note for detailed documentation.   Patient Care Team: Joseph Reeve, DO as PCP - General (Osteopathic Medicine) Darius Bump, [facility] as Pharmacist (Pharmacist)    Objective:  Lab Results  Component Value Date   CREATININE 1.94 (H) 12/23/2020   CREATININE 1.82 (H) 11/26/2019   CREATININE 1.89 (H) 06/28/2019    Lab Results  Component Value Date   HGBA1C 8.7 03/24/2021   Last diabetic Eye exam:  Lab Results  Component Value Date/Time   HMDIABEYEEXA Retinopathy (A) 05/20/2020 12:00 AM    Last diabetic Foot exam: No results found for: HMDIABFOOTEX      Component Value Date/Time   CHOL 339 (H) 12/27/2018 1156   TRIG 300 (H) 12/27/2018 1156   HDL 38 (L) 12/27/2018 1156   CHOLHDL 8.9 (H) 12/27/2018 1156   LDLCALC 250 (H) 12/27/2018 1156    Hepatic Function Latest Ref Rng & Units  12/23/2020 11/26/2019 06/28/2019  Total Protein 6.1 - 8.1 g/dL 6.1 5.9(L) 6.2  AST 10 - 35 U/L 10 9(L) 14  ALT 9 - 46 U/L 14 9 11   Total Bilirubin 0.2 - 1.2 mg/dL 0.3 0.4 0.3    Lab Results  Component Value Date/Time   TSH 1.25 11/26/2019 11:14 AM    CBC Latest Ref Rng & Units 11/26/2019 12/27/2018 10/12/2018  WBC 3.8 - 10.8 Thousand/uL 11.6(H) 10.7 11.6(H)  Hemoglobin 13.2 - 17.1 g/dL 8.5(L) 14.4 14.7  Hematocrit 38.5 - 50.0 % 26.7(L) 44.1 43.9  Platelets 140 - 400 Thousand/uL 397 331 293   Lab Results  Component Value Date/Time   VD25OH 8 (L) 12/27/2018 11:56 AM    Social History   Tobacco Use  Smoking Status Every Day   Packs/day: 0.50   Types: Cigarettes  Smokeless Tobacco Never  Tobacco Comments   refused   BP Readings from Last 3 Encounters:  03/24/21 116/68  01/13/21 123/69  07/14/20 (!) 142/74   Pulse Readings from Last 3 Encounters:  03/24/21 97  01/13/21 85  07/14/20 84   Wt Readings from Last 3 Encounters:  01/13/21 137 lb 0.6 oz (62.2 kg)  07/14/20 138 lb 1.9 oz (62.7 kg)  11/26/19 158 lb (71.7 kg)    Assessment: Review of patient past medical history, allergies, medications, health status, including review of consultants reports, laboratory and other test data, was performed as part of comprehensive evaluation and provision of chronic care management services.  SDOH:  (Social Determinants of Health) assessments and interventions performed:    CCM Care Plan  No Known Allergies  Medications Reviewed Today     Reviewed by Darius Bump, [facility] (Pharmacist) on 03/24/21 at 1012  Med List Status: <None>   Medication Order Taking? Sig Documenting Provider Last Dose Status Informant  ACCU-CHEK AVIVA PLUS test strip [id number] Yes  [provider] Taking Active   Accu-Chek Softclix Lancets lancets [id number] Yes  [provider] Taking Active   aspirin 81 MG tablet [id number] Yes Take 1 tablet (81 mg total) by mouth daily. Joseph Reeve, DO Taking Active   blood glucose meter kit and supplies [id number] Yes Dispense based on patient and insurance preference. Use up to four times daily as directed. (FOR ICD-10 E10.9, E11.9). Joseph Reeve, DO Taking Active   cilostazol (PLETAL) 100 MG tablet [id number] Yes Take 1 tablet (100 mg total) by mouth 2 (two) times daily. Joseph Reeve, DO Taking Active   FEROSUL 325 (65 Fe) MG tablet [id number] Yes TAKE 1 TABLET TWICE DAILY WITH MEALS Joseph Reeve, DO Taking Active   furosemide (LASIX) 20 MG tablet [id number] Yes TAKE 1 TABLET EVERY DAY (NO REFILLS, OVERDUE FOR VISIT/LABS). Joseph Reeve, DO Taking Active   insulin glargine, 2 Unit Dial, (TOUJEO MAX SOLOSTAR) 300 UNIT/ML Solostar Pen [id number] Yes Inject 16 Units into the skin at bedtime. Increase by 4 units every 4 days until fasting (morning) blood sugar is <150, then stay at that dose. Joseph Reeve, DO Taking Active            Med Note Dorene Ar Mar 24, 2021 10:11 AM) Taking 42 units as of 03/24/21   Insulin Pen Needle 29G X 12.7MM MISC [id number] Yes 100 each by Does not apply route daily. Please dispense qty, brand, appropriate pen sizing for toujeo solostar Joseph Reeve, DO Taking Active   Oxycodone HCl 10 MG TABS [id number] Yes Take 0.5-1 tablets (5-10 mg total) by mouth every 6 (six) hours as needed. Joseph Reeve, DO Taking Active   pantoprazole (PROTONIX) 40 MG tablet [id number] Yes TAKE 1 TABLET EVERY DAY (NO REFILLS, OVERDUE FOR VISIT/LABS) Joseph Reeve, DO Taking Active   potassium chloride (KLOR-CON) 10 MEQ tablet [id number] Yes Take 1 tablet (10 mEq total) by mouth daily. Joseph Reeve, DO Taking Active   rosuvastatin (CRESTOR) 40 MG tablet [id number] Yes TAKE 1 TABLET EVERY DAY (NEED MD APPOINTMENT) Joseph Reeve, DO Taking Active             Patient Active Problem List   Diagnosis Date Noted   Pilonidal cyst 11/25/2019   AKI (acute kidney injury) ([place])  11/03/2019   Hyponatremia 11/03/2019   Sepsis ([place]) 11/03/2019   Weakness 11/03/2019   Osteomyelitis of finger of left hand ([place])    Vitamin D deficiency 12/28/2018   Erectile dysfunction 12/28/2018   PVC (premature ventricular contraction) 12/27/2018   Essential hypertension 10/11/2018   Coronary artery disease 10/11/2018   Peripheral vascular disease ([place]) 10/11/2018   Mixed hyperlipidemia 10/11/2018   Osteoarthritis 10/11/2018   CKD (chronic kidney disease) stage 3, GFR 30-59 ml/min ([place]) 10/11/2018   History of anemia 10/11/2018   Other chronic pain 06/08/2018   Vaccine refused by patient 06/07/2018   Abnormal stress test 04/10/2018   Non-traumatic rhabdomyolysis 02/01/2018   Hypokalemia 02/01/2018   Hypophosphatemia 02/01/2018   Perianal abscess 10/16/2017   Nicotine dependence with current use 10/12/2017   Perirectal abscess 10/12/2017   Drug-induced constipation 10/12/2017  GI bleed 09/15/2017   Elevated troponin 06/15/2017   Chronic diastolic heart failure ([place]) 06/15/2017   Normocytic anemia 06/15/2017   Type 2 diabetes mellitus with diabetic neuropathy, with long-term current use of insulin ([place]) 04/24/2017   History of cerebrovascular accident 09/20/2016   Bone lesion 09/29/2014   Finger pain, right 09/29/2014   S/P coronary artery stent placement 12/20/2012   Old myocardial infarction 10/27/2010    Immunization History  Administered Date(s) Administered   Janssen (J&J) SARS-COV-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 12/04/2019   PFIZER(Purple Top)SARS-COV-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 06/10/2020    Conditions to be addressed/monitored: HTN, HLD, and DMII  Care Plan : Medication Management  Updates made by Darius Bump, [place] since 03/24/2021 12:00 AM     Problem: Diabetes, HTN, HLD   Priority: High     Long-Range Goal: Disease Progression Prevention   Start Date: 12/23/2020  Recent Progress: On track  Priority: High  Note:   Current Barriers:  Unable to independently afford treatment  regimen Unable to achieve control of diabetes   Pharmacist Clinical Goal(s):  Over the next 30 days, patient will verbalize ability to afford treatment regimen achieve adherence to monitoring guidelines and medication adherence to achieve therapeutic efficacy adhere to prescribed medication regimen as evidenced by fill history & verbal confirmation of patient assistance status  through collaboration with PharmD and provider.   Interventions: 1:1 collaboration with Joseph Reeve, DO regarding development and update of comprehensive plan of care as evidenced by provider attestation and co-signature Inter-disciplinary care team collaboration (see longitudinal plan of care) Comprehensive medication review performed; medication list updated in electronic medical record  Diabetes:  Uncontrolled: toujeo 42 units daily  Current glucose readings:  Date AM PM  6-Aug 110 175  7-Aug 122 107  8-Aug 74 195  9-Aug 68 195  10-Aug 115 161  11-Aug 117 228  12-Aug 105 229  13-Aug 136 236  14-Aug 69 142  15-Aug 75 122  16-Aug 162 219  17-Aug 88 146   Denies hypoglycemic/hyperglycemic symptoms  Previously discussed current meal patterns: breakfast: eggs, hashbrown, sausage or bacon, corn beef hash; lunch: skips lunch; dinner: varies - taco, burrito, spaghetti, cheeseburgers, pork sausage; drinks: Mt Dew (2 per day)  Current exercise: limited by residual stroke deficits but does shoulder rotations  Recommended goal of continuing green vegetable on plate 3x per week, NOW ADDING the goal of 2 MtDews per day, maximum, aiming for 1 per day. ,   Recommended continue current toujeo dosage, however if continue to have fasting BG <70, advised to reduce from 42 units to 40 units daily. Patient assistance for toujeo ([employer] PAP) approved 12/28/20, medication delivered to primary care office 01/06/21, 90DS will continue to be delivered. Recertification of application for 1610 year can be completed July 22, 2021.   Hypertension:  Controlled; current treatment: furosemide 13m daily  Current home readings: not checking at home  Denies hypotensive/hypertensive symptoms  Recommended continue current regimen, patient would ideally benefit from low dose ACEI for renal protection but soft BP  (consider lisinopril 2.523min future)  Hyperlipidemia:  Controlled, current treatment: Rosuvastatin 4040m  Medications previously tried: N/A  Educated on multiple benefits of statin therapy Recommended continue current regimen  Patient Goals/Self-Care Activities Over the next 90 days, patient will:  take medications as prescribed, check glucose 2x day (morning and afternoon), document, and provide at future appointments, collaborate with provider on medication access solutions, and engage in dietary modifications by choosing a green vegetable at least 3x per week  and aim to decrease to 1-2 Mt Dew per day at most  Follow Up Plan: Face-to-face follow up appointment with care management team member scheduled for:  1 month      Medication Assistance:   Patient assistance for toujeo ([employer] PAP) approved 12/28/20, medication delivered to primary care office 01/06/21, 90DS will continue to be delivered. Recertification of application for 3383 year can be completed July 22, 2021.   Patient's preferred pharmacy is:  [facility] - [place], [place] [address] Phone: [phone] Fax: [phone]  [id number] - [place], [place] - [address] [place] [address] Phone: [phone] Fax: [phone]  Follow Up:  Patient agrees to Care Plan and Follow-up.  Plan: Telephone follow up appointment with care management team member scheduled for:  1 month  Darius Bump

## 2021-03-29 IMAGING — MR MR [PERSON_NAME]*[PERSON_NAME]* WO/W CM
7 series · 34 of 40 positions shown · IV contrast (gadavist)
Comparison: Radiographs dated 06/24/2019

CLINICAL DATA: Pain and swelling left thumb after a soft tissue
injury 2 months ago. Abnormal radiographs dated 06/24/2019.

EXAM:
MRI OF THE LEFT FINGERS WITHOUT AND WITH CONTRAST
TECHNIQUE: Multiplanar, multisequence MR imaging of the left thumb was
performed before and after the administration of intravenous
contrast.
CONTRAST:  7.5mL GADAVIST GADOBUTROL 1 MMOL/ML IV SOLN

[Series 4: T1 · axial · 4.0mm · 0.39mm/px · z∈[-74,+47]mm · 7 of 30 slices shown]
[im 1/30]
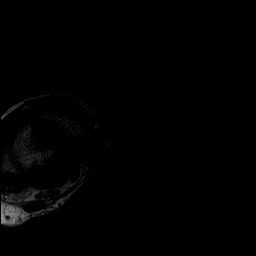
[im 5/30]
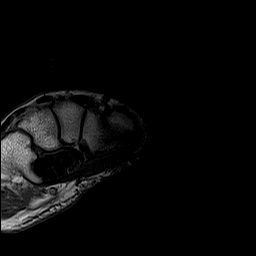
[im 10/30]
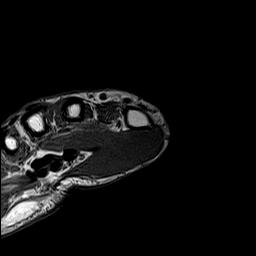
[im 15/30]
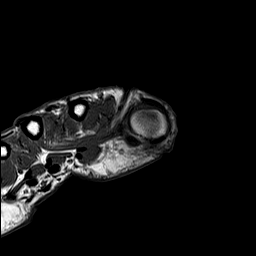
[im 20/30]
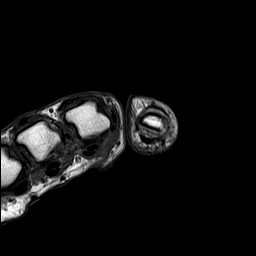
[im 25/30]
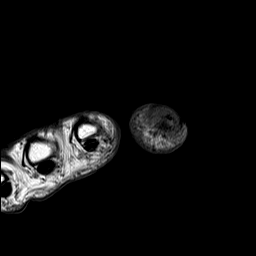
[im 30/30]
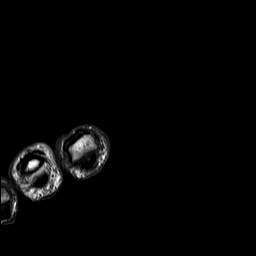

[Series 5: T2 fat-sat · axial · 4.0mm · 0.39mm/px · z∈[-74,+47]mm · 7 of 30 slices shown]
[im 1/30]
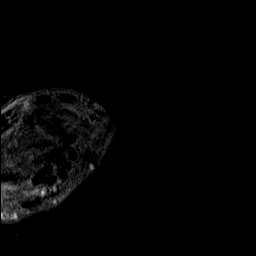
[im 5/30]
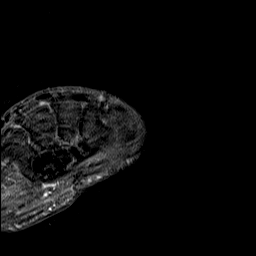
[im 10/30]
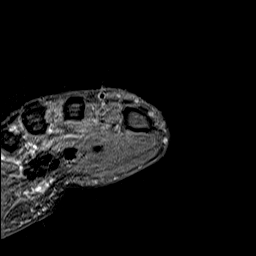
[im 15/30]
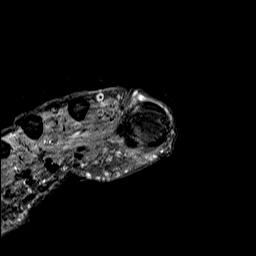
[im 20/30]
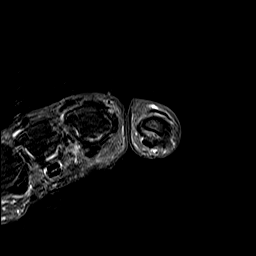
[im 25/30]
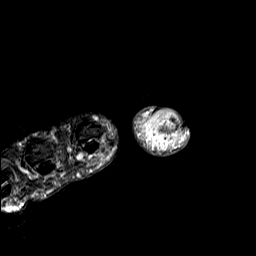
[im 30/30]
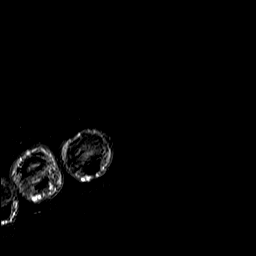

[Series 6: PD fat-sat · oblique · 2.0mm · 0.20mm/px · 4 of 15 slices shown (1 of 2)]
[im 1/15]
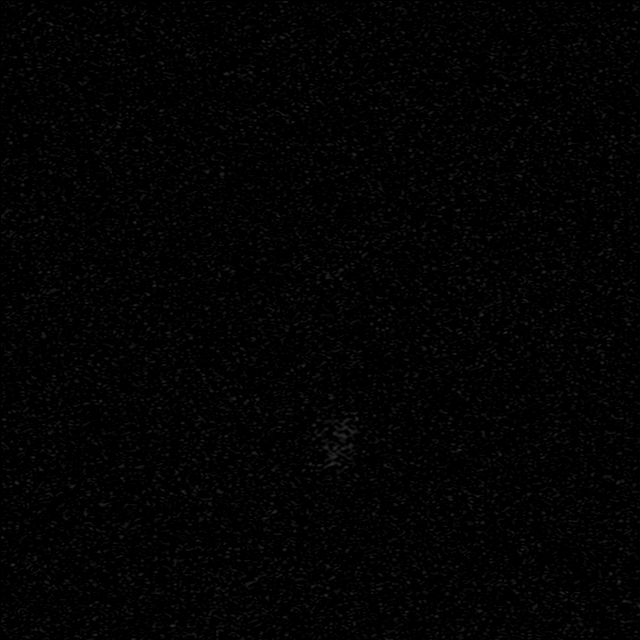
[im 5/15]
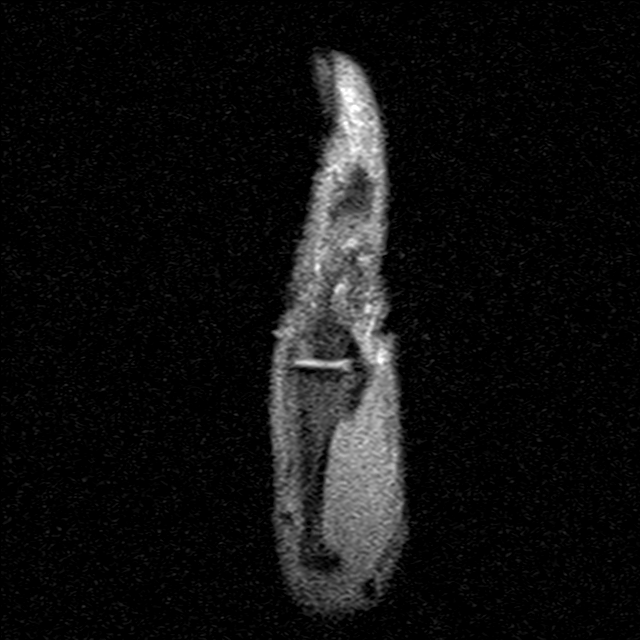
[im 10/15]
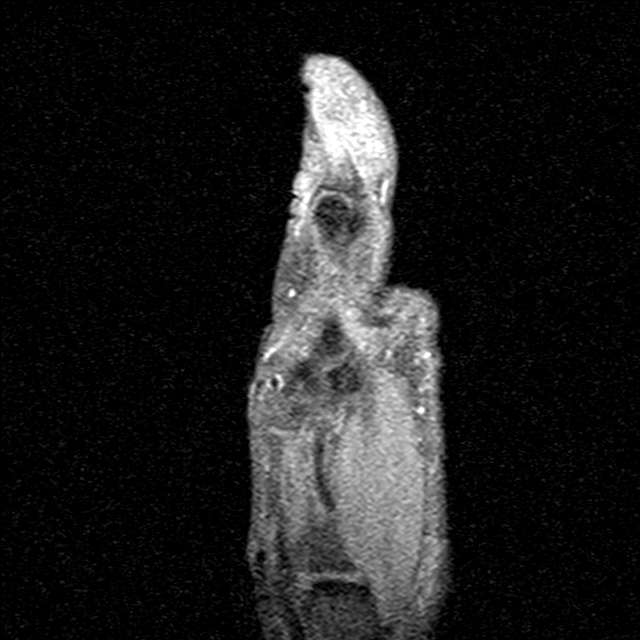
[im 15/15]
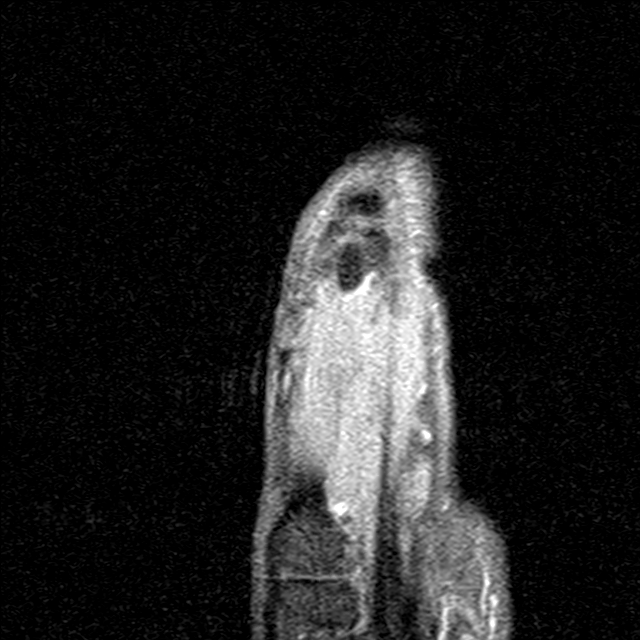

[Series 8: T1 fat-sat · axial · non-contrast · 4.0mm · 0.39mm/px · z∈[-66,+56]mm · 7 of 30 slices shown]
[im 1/30]
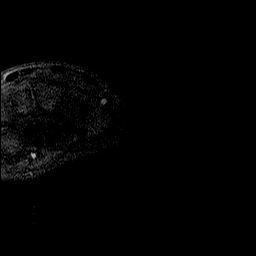
[im 5/30]
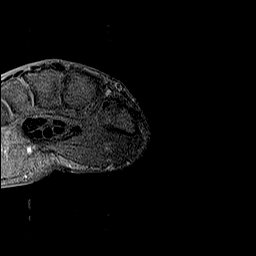
[im 10/30]
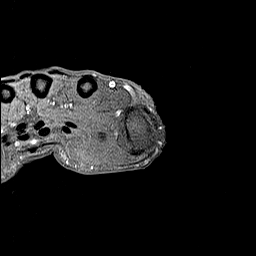
[im 15/30]
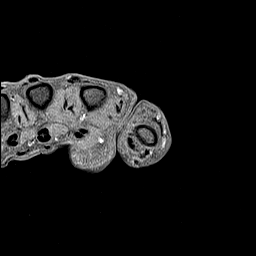
[im 20/30]
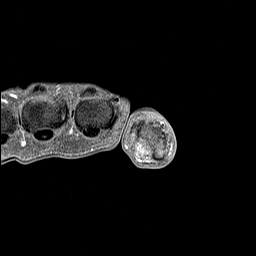
[im 25/30]
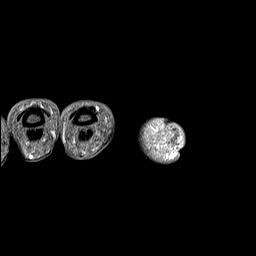
[im 30/30]
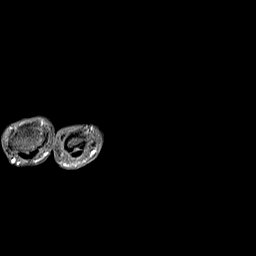

[Series 10: PD fat-sat · coronal · 2.0mm · 0.20mm/px · 4 of 15 slices shown (2 of 2)]
[im 1/15]
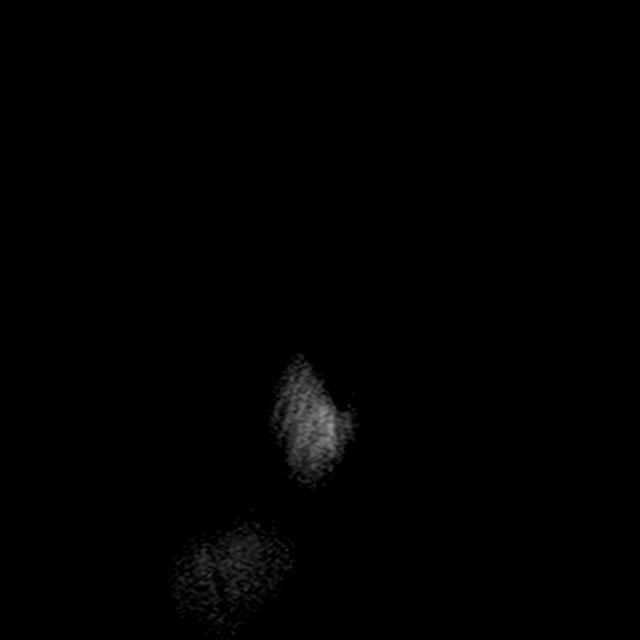
[im 5/15]
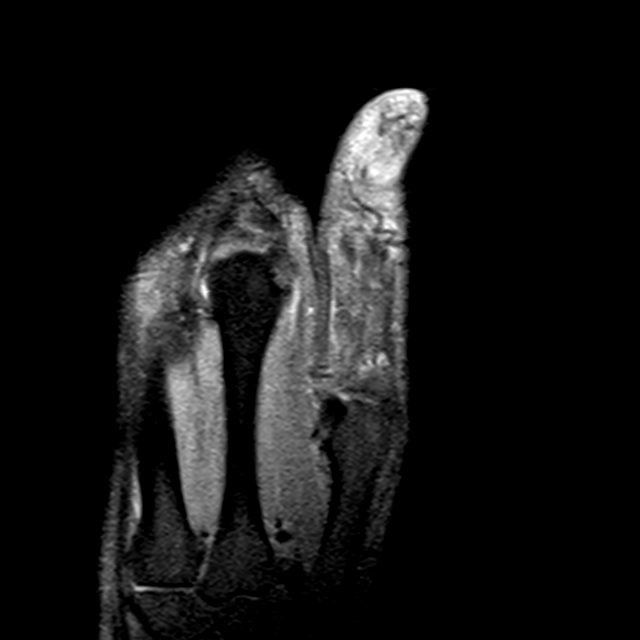
[im 10/15]
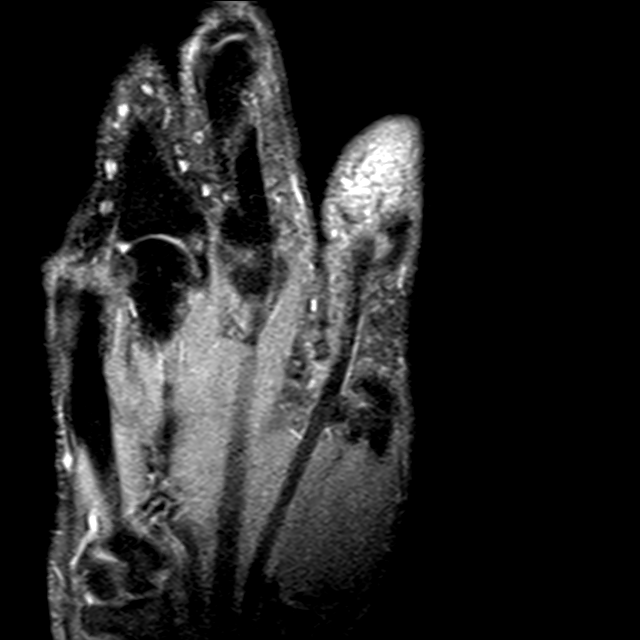
[im 15/15]
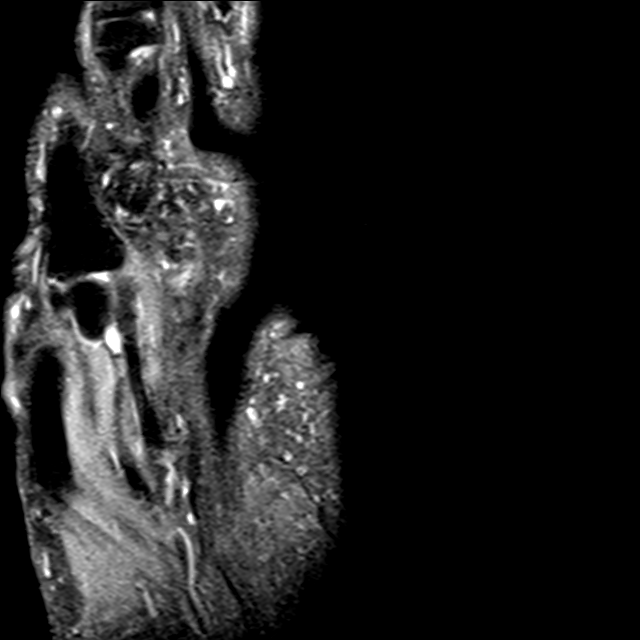

[Series 17: T1 fat-sat post-contrast · coronal · 2.0mm · 0.51mm/px · 4 of 15 slices shown (1 of 2)]
[im 1/15]
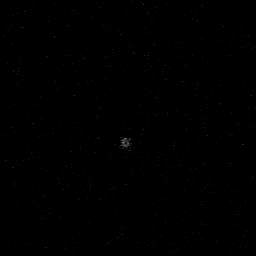
[im 5/15]
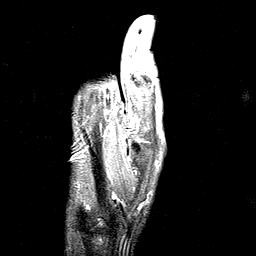
[im 10/15]
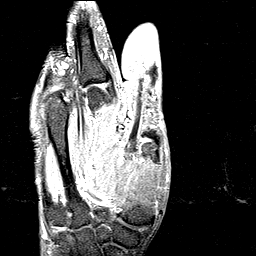
[im 15/15]
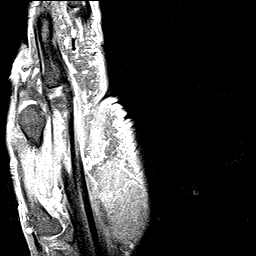

[Series 18: T1 fat-sat post-contrast · axial · 4.0mm · 0.39mm/px · 1 of 27 slices shown (2 of 2)]
[im 1/27]
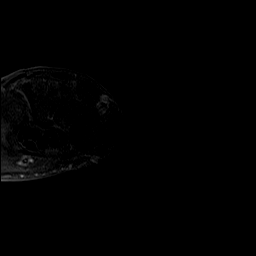

[34 of 40 positions shown; findings below may reference images not displayed]

FINDINGS: Bones/Joint/Cartilage

There is destruction volar medial aspect of the distal phalanx of
left thumb with abnormal T1 and T2 weighted signal and diffuse
abnormal enhancement of soft tissues of the tip of the thumb as well
as abnormal enhancement of portions of the root remnant of the
distal phalanx. There is no joint effusion. There is no definable
tumor.

Ligaments and tendons

Normal.

Soft tissues

Abnormal edema and abnormal enhancement of the soft tissues of tip
of the thumb consistent with cellulitis. No definable abscess.
IMPRESSION: 1. Osteomyelitis of the volar medial aspect of the distal phalanx of
the left thumb.
2. Cellulitis of the tip of the thumb.

## 2021-04-15 ENCOUNTER — Ambulatory Visit: Payer: Medicare HMO | Admitting: Osteopathic Medicine

## 2021-04-18 ENCOUNTER — Other Ambulatory Visit: Payer: Self-pay | Admitting: Osteopathic Medicine

## 2021-04-20 ENCOUNTER — Telehealth: Payer: Self-pay

## 2021-04-20 ENCOUNTER — Ambulatory Visit (INDEPENDENT_AMBULATORY_CARE_PROVIDER_SITE_OTHER): Payer: Medicare HMO | Admitting: Pharmacist

## 2021-04-20 ENCOUNTER — Other Ambulatory Visit: Payer: Self-pay | Admitting: Osteopathic Medicine

## 2021-04-20 ENCOUNTER — Other Ambulatory Visit: Payer: Self-pay

## 2021-04-20 DIAGNOSIS — I10 Essential (primary) hypertension: Secondary | ICD-10-CM

## 2021-04-20 DIAGNOSIS — E782 Mixed hyperlipidemia: Secondary | ICD-10-CM

## 2021-04-20 DIAGNOSIS — E118 Type 2 diabetes mellitus with unspecified complications: Secondary | ICD-10-CM

## 2021-04-20 NOTE — Patient Instructions (Signed)
Diabetes Mellitus and Sick Day Management Blood sugar (glucose) can be difficult to control when you are sick. Common illnesses that can cause problems for people with diabetes (diabetes mellitus) include colds, fever, flu (influenza), nausea, vomiting, and diarrhea. These illnesses can cause stress and loss of body fluids (dehydration), and those issues can cause blood glucose levels to increase. Because of this, it is very important to take your insulin and diabetes medicines and eat some form of carbohydrate when you are sick. You should make a plan for days when you are sick (sick day plan) as part of your diabetes management plan. You and your health care provider should make this plan in advance. The following guidelines are intended to help you manage an illness that lasts for about 24 hours or less. Your health care provider may also give you more specific instructions. How to manage your blood glucose  Check your blood glucose every 2-4 hours, or as often as told by your health care provider. If you use insulin, take your usual dose. If your blood glucose continues to be too high, you may need to take an additional insulin dose as told by your health care provider. Know your sick day treatment goals. Your target blood glucose levels may be different when you are sick. If you use oral diabetes medicine, continue to take your medicines. Have a plan with your health care provider for these medicines while you are sick. If you use injectable hormone medicines other than insulin to control your diabetes, have a plan with your health care provider for these medicines while you are sick. Follow these instructions at home Check your ketones If you have type 1 diabetes, check your urine ketones every 4 hours. If you have type 2 diabetes, check your urine ketones as often as told by your health care provider. Eating and drinking Drink enough fluid to keep your urine pale yellow. This is especially  important if you have a fever, vomiting, or diarrhea. Those symptoms can lead to dehydration. Follow instructions from your health care provider about beverages to avoid. Do not drink alcohol, caffeine, or drinks that contain a lot of sugar. You need to eat some form of carbohydrates when you are sick. Eat 45-50 grams (45-50 g) of carbohydrates every 3-4 hours until you feel better. All of the food choices below contain about 15 g of carbohydrates. Plan ahead and keep some of these foods around so you have them if you get sick. 4-6 oz (120-177 mL) carbonated beverage that contains sugar, such as regular (not diet) soda. You may be able to drink carbonated beverages more easily if you open the beverage and let it sit at room temperature for a few minutes before drinking.  of a twin frozen ice pop. 4 oz (120 g) regular gelatin. 4 oz (120 mL) fruit juice. 4 oz (120 g) ice cream or frozen yogurt. 2 oz (60 g) sherbet. 1 slice bread or toast. 6 saltine crackers. 5 vanilla wafers. Medicines Take-over-the-counter and prescription medicines only as told by your health care provider. Check medicine labels for added sugars. Some medicines may contain sugar or types of sugars that can raise your blood glucose level. Questions to ask your health care provider Should I adjust my diabetes medicines? How often do I need to check my blood glucose? What supplies do I need to manage my diabetes at home when I am sick? What number can I call if I have questions? What foods and drinks should  I avoid? Contact a health care provider if: You have been sick or have had a fever for 2 days or longer and you are not getting better. Your blood glucose is at or above 240 mg/dl (13.3 mmol/L), even after you take an additional insulin dose. You are unable to drink fluids without vomiting. You have any of the following for more than 6 hours: Nausea. Vomiting. Diarrhea. Get help right away if: You have difficulty  breathing. You have moderate or high ketone levels in your urine. You have a change in how you think, feel, or act (mental status). You develop symptoms of diabetic ketoacidosis. These include: Nausea. Vomiting. Excessive thirst. Excessive urination. Fruity or sweet smelling breath. Rapid breathing. Pain in the abdomen. Your blood glucose is lower than '54mg'$ /dl (3.0 mmol/L). You used emergency glucagon to treat low blood glucose. These symptoms may represent a serious problem that is an emergency. Do not wait to see if the symptoms will go away. Get medical help right away. Call your local emergency services (911 in the [place] Do not drive yourself to the hospital. Summary Blood sugar (glucose) can be difficult to control when you are sick. Common illnesses that can cause problems for people with diabetes (diabetes mellitus) include colds, fever, flu (influenza), nausea, vomiting, and diarrhea. Illnesses can cause stress and loss of body fluids (dehydration), and those issues can cause blood glucose levels to increase. Make a plan for days when you are sick (sick day plan) as part of your diabetes management plan. You and your health care provider should make this plan in advance. It is very important to take your insulin and diabetes medicines and to eat some form of carbohydrate when you are sick. Contact your health care provider if have problems managing your blood glucose levels when you are sick, or if you have been sick or had a fever for 2 days or longer and are not getting better. This information is not intended to replace advice given to you by your health care provider. Make sure you discuss any questions you have with your health care provider. Document Revised: 08/15/2019 Document Reviewed: 08/15/2019 Elsevier Patient Education  [place].   Visit Information  PATIENT GOALS:  Goals Addressed             This Visit's Progress    Medication Management        Patient Goals/Self-Care Activities Over the next 30 days, patient will:  take medications as prescribed, check glucose 2x day (morning and afternoon), document, and provide at future appointments, collaborate with provider on medication access solutions, and engage in dietary modifications by choosing a green vegetable at least 3x per week and aim to decrease to 1-2 Mt Dew per day at most  Follow Up Plan: Telephone follow up appointment with care management team member scheduled for:  1 month        Patient verbalizes understanding of instructions provided today and agrees to view in [place].   Telephone follow up appointment with care management team member scheduled for: 1 months  Joseph Grimes

## 2021-04-20 NOTE — Progress Notes (Signed)
Chronic Care Management Pharmacy Note  04/20/2021 Name:  Joseph Grimes MRN:  [id number] DOB:  06-01-1961  Summary: addressed DM, HTN, HLD. Patient taking toujeo 42 units daily, but has not been feeling well/eating less, and noted several BG in 60s, x3. On a day he was eating much less, he reduced insulin to 32 units.  Recommendations/Changes made from today's visit: Reduce toujeo to 36 units daily to avoid hypoglycemia.  Plan: f/u with pharmacist in 1 month  Subjective: Joseph Grimes is an 60 y.o. year old male who is a primary patient of Emeterio Reeve, DO.  The CCM team was consulted for assistance with disease management and care coordination needs.    Engaged with patient by telephone for follow up visit in response to provider referral for pharmacy case management and/or care coordination services.   Consent to Services:  The patient was given information about Chronic Care Management services, agreed to services, and gave verbal consent prior to initiation of services.  Please see initial visit note for detailed documentation.   Patient Care Team: Emeterio Reeve, DO as PCP - General (Osteopathic Medicine) Darius Bump, [facility] as Pharmacist (Pharmacist)  Objective:  Lab Results  Component Value Date   CREATININE 1.94 (H) 12/23/2020   CREATININE 1.82 (H) 11/26/2019   CREATININE 1.89 (H) 06/28/2019    Lab Results  Component Value Date   HGBA1C 8.7 03/24/2021   Last diabetic Eye exam:  Lab Results  Component Value Date/Time   HMDIABEYEEXA Retinopathy (A) 05/20/2020 12:00 AM    Last diabetic Foot exam: No results found for: HMDIABFOOTEX      Component Value Date/Time   CHOL 339 (H) 12/27/2018 1156   TRIG 300 (H) 12/27/2018 1156   HDL 38 (L) 12/27/2018 1156   CHOLHDL 8.9 (H) 12/27/2018 1156   LDLCALC 250 (H) 12/27/2018 1156    Hepatic Function Latest Ref Rng & Units 12/23/2020 11/26/2019 06/28/2019  Total Protein 6.1 - 8.1 g/dL 6.1 5.9(L) 6.2  AST  10 - 35 U/L 10 9(L) 14  ALT 9 - 46 U/L _0 Total Bilirubin 0.2 - 1.2 mg/dL 0.3 0.4 0.3    Lab Results  Component Value Date/Time   TSH 1.25 11/26/2019 11:14 AM    CBC Latest Ref Rng & Units 11/26/2019 12/27/2018 10/12/2018  WBC 3.8 - 10.8 Thousand/uL 11.6(H) 10.7 11.6(H)  Hemoglobin 13.2 - 17.1 g/dL 8.5(L) 14.4 14.7  Hematocrit 38.5 - 50.0 % 26.7(L) 44.1 43.9  Platelets 140 - 400 Thousand/uL 397 331 293    Lab Results  Component Value Date/Time   VD25OH 8 (L) 12/27/2018 11:56 AM     Social History   Tobacco Use  Smoking Status Every Day   Packs/day: 0.50   Types: Cigarettes  Smokeless Tobacco Never  Tobacco Comments   refused   BP Readings from Last 3 Encounters:  03/24/21 116/68  01/13/21 123/69  07/14/20 (!) 142/74   Pulse Readings from Last 3 Encounters:  03/24/21 97  01/13/21 85  07/14/20 84   Wt Readings from Last 3 Encounters:  01/13/21 137 lb 0.6 oz (62.2 kg)  07/14/20 138 lb 1.9 oz (62.7 kg)  11/26/19 158 lb (71.7 kg)    Assessment: Review of patient past medical history, allergies, medications, health status, including review of consultants reports, laboratory and other test data, was performed as part of comprehensive evaluation and provision of chronic care management services.   SDOH:  (Social Determinants of Health) assessments and interventions performed:  CCM Care Plan  No Known Allergies  Medications Reviewed Today     Reviewed by Darius Bump, [facility] (Pharmacist) on 03/24/21 at 1012  Med List Status: <None>   Medication Order Taking? Sig Documenting Provider Last Dose Status Informant  ACCU-CHEK AVIVA PLUS test strip [id number] Yes  [provider] Taking Active   Accu-Chek Softclix Lancets lancets [id number] Yes  [provider] Taking Active   aspirin 81 MG tablet [id number] Yes Take 1 tablet (81 mg total) by mouth daily. Emeterio Reeve, DO Taking Active   blood glucose meter kit and supplies [id number] Yes  Dispense based on patient and insurance preference. Use up to four times daily as directed. (FOR ICD-10 E10.9, E11.9). Emeterio Reeve, DO Taking Active   cilostazol (PLETAL) 100 MG tablet [id number] Yes Take 1 tablet (100 mg total) by mouth 2 (two) times daily. Emeterio Reeve, DO Taking Active   FEROSUL 325 (65 Fe) MG tablet [id number] Yes TAKE 1 TABLET TWICE DAILY WITH MEALS Emeterio Reeve, DO Taking Active   furosemide (LASIX) 20 MG tablet [id number] Yes TAKE 1 TABLET EVERY DAY (NO REFILLS, OVERDUE FOR VISIT/LABS). Emeterio Reeve, DO Taking Active   insulin glargine, 2 Unit Dial, (TOUJEO MAX SOLOSTAR) 300 UNIT/ML Solostar Pen [id number] Yes Inject 16 Units into the skin at bedtime. Increase by 4 units every 4 days until fasting (morning) blood sugar is <150, then stay at that dose. Emeterio Reeve, DO Taking Active            Med Note Dorene Ar Mar 24, 2021 10:11 AM) Taking 42 units as of 03/24/21   Insulin Pen Needle 29G X 12.7MM MISC [id number] Yes 100 each by Does not apply route daily. Please dispense qty, brand, appropriate pen sizing for toujeo solostar Emeterio Reeve, DO Taking Active   Oxycodone HCl 10 MG TABS [id number] Yes Take 0.5-1 tablets (5-10 mg total) by mouth every 6 (six) hours as needed. Emeterio Reeve, DO Taking Active   pantoprazole (PROTONIX) 40 MG tablet [id number] Yes TAKE 1 TABLET EVERY DAY (NO REFILLS, OVERDUE FOR VISIT/LABS) Emeterio Reeve, DO Taking Active   potassium chloride (KLOR-CON) 10 MEQ tablet [id number] Yes Take 1 tablet (10 mEq total) by mouth daily. Emeterio Reeve, DO Taking Active   rosuvastatin (CRESTOR) 40 MG tablet [id number] Yes TAKE 1 TABLET EVERY DAY (NEED MD APPOINTMENT) Emeterio Reeve, DO Taking Active             Patient Active Problem List   Diagnosis Date Noted   Pilonidal cyst 11/25/2019   AKI (acute kidney injury) ([place]) 11/03/2019   Hyponatremia 11/03/2019   Sepsis ([place]) 11/03/2019   Weakness  11/03/2019   Osteomyelitis of finger of left hand ([place])    Vitamin D deficiency 12/28/2018   Erectile dysfunction 12/28/2018   PVC (premature ventricular contraction) 12/27/2018   Essential hypertension 10/11/2018   Coronary artery disease 10/11/2018   Peripheral vascular disease ([place]) 10/11/2018   Mixed hyperlipidemia 10/11/2018   Osteoarthritis 10/11/2018   CKD (chronic kidney disease) stage 3, GFR 30-59 ml/min ([place]) 10/11/2018   History of anemia 10/11/2018   Other chronic pain 06/08/2018   Vaccine refused by patient 06/07/2018   Abnormal stress test 04/10/2018   Non-traumatic rhabdomyolysis 02/01/2018   Hypokalemia 02/01/2018   Hypophosphatemia 02/01/2018   Perianal abscess 10/16/2017   Nicotine dependence with current use 10/12/2017   Perirectal abscess 10/12/2017   Drug-induced constipation 10/12/2017   GI bleed 09/15/2017   Elevated troponin 06/15/2017   Chronic  diastolic heart failure ([place]) 06/15/2017   Normocytic anemia 06/15/2017   Type 2 diabetes mellitus with diabetic neuropathy, with long-term current use of insulin ([place]) 04/24/2017   History of cerebrovascular accident 09/20/2016   Bone lesion 09/29/2014   Finger pain, right 09/29/2014   S/P coronary artery stent placement 12/20/2012   Old myocardial infarction 10/27/2010    Immunization History  Administered Date(s) Administered   Janssen (J&J) SARS-COV-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 12/04/2019   PFIZER(Purple Top)SARS-COV-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 06/10/2020    Conditions to be addressed/monitored: HTN, HLD, and DMII  There are no care plans that you recently modified to display for this patient.   Medication Assistance: Patient assistance for toujeo (Sanofi PAP) approved 12/28/20, medication delivered to primary care office 01/06/21, 90DS will continue to be delivered. Recertification of application for 5465 year can be completed July 22, 2021.  Patient's preferred pharmacy is:  [facility] - [place], [address] [place] [address] Phone: [phone] Fax: [phone]  [id number] - [place], [place] - [address] [place] [address] Phone: [phone] Fax: [phone]  Uses pill box? Yes Pt endorses 100% compliance  Follow Up:  Patient agrees to Care Plan and Follow-up.  Plan: Telephone follow up appointment with care management team member scheduled for:  1 month  Darius Bump

## 2021-04-20 NOTE — Telephone Encounter (Signed)
Pt called stating that he has a sinus infection and wishes to speak with someone.  Attempted to contact pt and LVM for pt to return call to discuss.  Charyl Bigger, CMA

## 2021-04-21 ENCOUNTER — Telehealth: Payer: Medicare HMO | Admitting: Osteopathic Medicine

## 2021-04-21 ENCOUNTER — Other Ambulatory Visit: Payer: Self-pay

## 2021-04-21 MED ORDER — FUROSEMIDE 20 MG PO TABS: ORAL_TABLET | ORAL | 0 refills | Status: DC

## 2021-04-21 NOTE — Telephone Encounter (Signed)
[place] requesting a new rx for  True metrix blood glucose strips.

## 2021-04-21 NOTE — Telephone Encounter (Signed)
Pt states that he has a sinus infection and is requesting an antibiotic be called to the pharmacy for him.  Advised pt that Dr. Sheppard Coil will require an appointment before she will prescribe medications.  Pt scheduled with Dr. Sheppard Coil for 04/22/2021 at 3:30pm for a telephone visit.  Charyl Bigger, CMA

## 2021-04-22 ENCOUNTER — Encounter: Payer: Self-pay | Admitting: Osteopathic Medicine

## 2021-04-22 ENCOUNTER — Telehealth (INDEPENDENT_AMBULATORY_CARE_PROVIDER_SITE_OTHER): Payer: Medicare HMO | Admitting: Osteopathic Medicine

## 2021-04-22 DIAGNOSIS — J329 Chronic sinusitis, unspecified: Secondary | ICD-10-CM | POA: Diagnosis not present

## 2021-04-22 MED ORDER — IPRATROPIUM BROMIDE 0.06 % NA SOLN: 2.0000 | Freq: Four times a day (QID) | NASAL | 1 refills | Status: DC

## 2021-04-22 MED ORDER — AZITHROMYCIN 250 MG PO TABS: ORAL_TABLET | ORAL | 0 refills | Status: AC

## 2021-04-22 NOTE — Progress Notes (Signed)
Telemedicine Visit via  Audio only - telephone (patient preference /  technical difficulty with MyChart video application)  I connected with Joseph Grimes on 04/22/21 at 3:37 PM  by phone or  telemedicine application as noted above  I verified that I am speaking with or regarding  the correct patient using two identifiers.  Participants: Myself, Dr Emeterio Reeve DO Patient: Joseph Grimes Patient proxy if applicable: none Other, if applicable: none  Patient is at home I am in office at [facility]    I discussed the limitations of evaluation and management  by telemedicine and the availability of in person appointments.  The participant(s) above expressed understanding and  agreed to proceed with this appointment via telemedicine.       History of Present Illness: Joseph Grimes is a 60 y.o. male who would like to discuss sinus issues. Ongoing 1+ weeks, concern for increased pressure and frontal headache.       Observations/Objective: There were no vitals taken for this visit. BP Readings from Last 3 Encounters:  03/24/21 116/68  01/13/21 123/69  07/14/20 (!) 142/74   Exam: Normal Speech.  NAD  Lab and Radiology Results No results found for this or any previous visit (from the past 72 hour(s)). No results found.     Assessment and Plan: 60 y.o. male with The encounter diagnosis was Sinusitis, unspecified chronicity, unspecified location.   PDMP not reviewed this encounter. No orders of the defined types were placed in this encounter.  Meds ordered this encounter  Medications   azithromycin (ZITHROMAX) 250 MG tablet    Sig: Take 2 tablets on day 1, then 1 tablet daily on days 2 through 5    Dispense:  6 tablet    Refill:  0   ipratropium (ATROVENT) 0.06 % nasal spray    Sig: Place 2 sprays into both nostrils 4 (four) times daily. As needed for runny nose / postnasal drip    Dispense:  15 mL    Refill:  1   There are no Patient  Instructions on file for this visit.  Instructions sent via MyChart.   Follow Up Instructions: Return if symptoms worsen or fail to improve.    I discussed the assessment and treatment plan with the patient. The patient was provided an opportunity to ask questions and all were answered. The patient agreed with the plan and demonstrated an understanding of the instructions.   The patient was advised to call back or seek an in-person evaluation if any new concerns, if symptoms worsen or if the condition fails to improve as anticipated.  21 minutes of non-face-to-face time was provided during this encounter.      . . . . . . . . . . . . . Marland Kitchen                   Historical information moved to improve visibility of documentation.  Past Medical History:  Diagnosis Date   CKD (chronic kidney disease) stage 3, GFR 30-59 ml/min ([facility]) 10/11/2018   Coronary artery disease 10/11/2018   Diabetes ([place])    Diabetes mellitus type 2, controlled, with complications ([place]) 09/12/971   Essential hypertension 10/11/2018   GERD (gastroesophageal reflux disease)    Heart attack ([place]) 2006   stent placed in right coronary artery   High cholesterol    History of anemia 10/11/2018   History of low potassium    Low hemoglobin    Mixed hyperlipidemia 10/11/2018  Peripheral vascular disease ([place]) 10/11/2018   Stroke ([place])    Tobacco dependence 10/11/2018   Past Surgical History:  Procedure Laterality Date   AMPUTATION Left 07/19/2019   Procedure: LEFT PARTIAL THUMB AMPUTATION;  Surgeon: Leandrew Koyanagi, MD;  Location: [facility];  Service: Orthopedics;  Laterality: Left;  with digital block   CARDIAC CATHETERIZATION  2006   04/18/18: no stents, stent in '06   FEMORAL-POPLITEAL BYPASS GRAFT Right 05/12/2017   HAND SURGERY     KNEE ARTHROSCOPY     bilat   TOE AMPUTATION     x2   TONSILLECTOMY     Social History   Tobacco Use   Smoking status: Every Day    Packs/day:  0.50    Types: Cigarettes   Smokeless tobacco: Never   Tobacco comments:    refused  Substance Use Topics   Alcohol use: Not Currently   family history includes Diabetes in his brother, father, sister, and sister; Heart attack in his father; Heart disease in his mother.  Medications: Current Outpatient Medications  Medication Sig Dispense Refill   Accu-Chek Softclix Lancets lancets      aspirin 81 MG tablet Take 1 tablet (81 mg total) by mouth daily. 90 tablet 3   azithromycin (ZITHROMAX) 250 MG tablet Take 2 tablets on day 1, then 1 tablet daily on days 2 through 5 6 tablet 0   blood glucose meter kit and supplies Dispense based on patient and insurance preference. Use up to four times daily as directed. (FOR ICD-10 E10.9, E11.9). 1 each 0   cilostazol (PLETAL) 100 MG tablet Take 1 tablet (100 mg total) by mouth 2 (two) times daily. 180 tablet 3   FEROSUL 325 (65 Fe) MG tablet TAKE 1 TABLET TWICE DAILY WITH MEALS 180 tablet 1   furosemide (LASIX) 20 MG tablet TAKE 1 TABLET EVERY DAY.  NO REFILLS, OVERDUE FOR VISIT/LABS 90 tablet 0   insulin glargine, 2 Unit Dial, (TOUJEO MAX SOLOSTAR) 300 UNIT/ML Solostar Pen Inject 16 Units into the skin at bedtime. Increase by 4 units every 4 days until fasting (morning) blood sugar is <150, then stay at that dose. 12 each 2   Insulin Pen Needle 29G X 12.7MM MISC 100 each by Does not apply route daily. Please dispense qty, brand, appropriate pen sizing for toujeo solostar 100 each 2   ipratropium (ATROVENT) 0.06 % nasal spray Place 2 sprays into both nostrils 4 (four) times daily. As needed for runny nose / postnasal drip 15 mL 1   Oxycodone HCl 10 MG TABS Take 0.5-1 tablets (5-10 mg total) by mouth every 6 (six) hours as needed. 30 tablet 0   pantoprazole (PROTONIX) 40 MG tablet TAKE 1 TABLET EVERY DAY (NO REFILLS, OVERDUE FOR VISIT/LABS) 90 tablet 0   potassium chloride (KLOR-CON) 10 MEQ tablet Take 1 tablet (10 mEq total) by mouth daily. 90 tablet 3    rosuvastatin (CRESTOR) 40 MG tablet TAKE 1 TABLET EVERY DAY (NEED MD APPOINTMENT) 90 tablet 0   TRUE METRIX BLOOD GLUCOSE TEST test strip TEST UP TO FOUR TIMES DAILY AS DIRECTED 200 strip 2   No current facility-administered medications for this visit.   No Known Allergies   If phone visit, billing and coding can please add appropriate modifier if needed

## 2021-04-27 ENCOUNTER — Ambulatory Visit: Payer: Medicare HMO | Admitting: Osteopathic Medicine

## 2021-05-07 DIAGNOSIS — E118 Type 2 diabetes mellitus with unspecified complications: Secondary | ICD-10-CM | POA: Diagnosis not present

## 2021-05-07 DIAGNOSIS — E782 Mixed hyperlipidemia: Secondary | ICD-10-CM

## 2021-05-07 DIAGNOSIS — I10 Essential (primary) hypertension: Secondary | ICD-10-CM | POA: Diagnosis not present

## 2021-05-16 ENCOUNTER — Other Ambulatory Visit: Payer: Self-pay | Admitting: Osteopathic Medicine

## 2021-05-17 ENCOUNTER — Ambulatory Visit (INDEPENDENT_AMBULATORY_CARE_PROVIDER_SITE_OTHER): Payer: Medicare HMO | Admitting: Pharmacist

## 2021-05-17 ENCOUNTER — Other Ambulatory Visit: Payer: Self-pay

## 2021-05-17 DIAGNOSIS — E782 Mixed hyperlipidemia: Secondary | ICD-10-CM

## 2021-05-17 DIAGNOSIS — I10 Essential (primary) hypertension: Secondary | ICD-10-CM

## 2021-05-17 DIAGNOSIS — E118 Type 2 diabetes mellitus with unspecified complications: Secondary | ICD-10-CM

## 2021-05-17 MED ORDER — ROSUVASTATIN CALCIUM 40 MG PO TABS: 40.0000 mg | ORAL_TABLET | Freq: Every day | ORAL | 0 refills | Status: DC

## 2021-05-17 NOTE — Telephone Encounter (Signed)
Pt has an upcoming appt on the 26th of Oct to transfer care

## 2021-05-17 NOTE — Telephone Encounter (Signed)
Please call pt to schedule appt to establish care with either Dr. Zigmund Daniel or Samuel Bouche for further refills.  Charyl Bigger, CMA

## 2021-05-17 NOTE — Progress Notes (Signed)
Chronic Care Management Pharmacy Note  05/17/2021 Name:  Joseph Grimes MRN:  [id number] DOB:  12-13-1960  Summary: addressed DM, HTN, HLD. Patient taking toujeo 32 units daily, self-reduced his dose due to some low BG <70. Facilitated the process for him to choose PCP, get scheduled, and continue care. He plans to establish care with Samuel Bouche.  Recommendations/Changes made from today's visit: Continue current regimen, collect A1c at next visit with pharmacist in November, may consider GLP1 for ASCVD benefit with goal of reducing insulin, minimizing hypoglycemia, and further achieving BG control. Would initiate patient assistance in order for this to be financially feasible.  Plan: f/u with pharmacist in 1 month  Subjective: Joseph Grimes is an 60 y.o. year old male who is a primary patient of Emeterio Reeve, DO.  The CCM team was consulted for assistance with disease management and care coordination needs.    Engaged with patient by telephone for follow up visit in response to provider referral for pharmacy case management and/or care coordination services.   Consent to Services:  The patient was given information about Chronic Care Management services, agreed to services, and gave verbal consent prior to initiation of services.  Please see initial visit note for detailed documentation.   Patient Care Team: Emeterio Reeve, DO as PCP - General (Osteopathic Medicine) Darius Bump, [facility] as Pharmacist (Pharmacist)  Objective:  Lab Results  Component Value Date   CREATININE 1.94 (H) 12/23/2020   CREATININE 1.82 (H) 11/26/2019   CREATININE 1.89 (H) 06/28/2019    Lab Results  Component Value Date   HGBA1C 8.7 03/24/2021   Last diabetic Eye exam:  Lab Results  Component Value Date/Time   HMDIABEYEEXA Retinopathy (A) 05/20/2020 12:00 AM    Last diabetic Foot exam: No results found for: HMDIABFOOTEX      Component Value Date/Time   CHOL 339 (H) 12/27/2018 1156    TRIG 300 (H) 12/27/2018 1156   HDL 38 (L) 12/27/2018 1156   CHOLHDL 8.9 (H) 12/27/2018 1156   LDLCALC 250 (H) 12/27/2018 1156    Hepatic Function Latest Ref Rng & Units 12/23/2020 11/26/2019 06/28/2019  Total Protein 6.1 - 8.1 g/dL 6.1 5.9(L) 6.2  AST 10 - 35 U/L 10 9(L) 14  ALT 9 - 46 U/L $Remo'14 9 11  'iRWOt$ Total Bilirubin 0.2 - 1.2 mg/dL 0.3 0.4 0.3    Lab Results  Component Value Date/Time   TSH 1.25 11/26/2019 11:14 AM    CBC Latest Ref Rng & Units 11/26/2019 12/27/2018 10/12/2018  WBC 3.8 - 10.8 Thousand/uL 11.6(H) 10.7 11.6(H)  Hemoglobin 13.2 - 17.1 g/dL 8.5(L) 14.4 14.7  Hematocrit 38.5 - 50.0 % 26.7(L) 44.1 43.9  Platelets 140 - 400 Thousand/uL 397 331 293    Lab Results  Component Value Date/Time   VD25OH 8 (L) 12/27/2018 11:56 AM     Social History   Tobacco Use  Smoking Status Every Day   Packs/day: 0.50   Types: Cigarettes  Smokeless Tobacco Never  Tobacco Comments   refused   BP Readings from Last 3 Encounters:  03/24/21 116/68  01/13/21 123/69  07/14/20 (!) 142/74   Pulse Readings from Last 3 Encounters:  03/24/21 97  01/13/21 85  07/14/20 84   Wt Readings from Last 3 Encounters:  01/13/21 137 lb 0.6 oz (62.2 kg)  07/14/20 138 lb 1.9 oz (62.7 kg)  11/26/19 158 lb (71.7 kg)    Assessment: Review of patient past medical history, allergies, medications, health status, including review  of consultants reports, laboratory and other test data, was performed as part of comprehensive evaluation and provision of chronic care management services.   SDOH:  (Social Determinants of Health) assessments and interventions performed:    CCM Care Plan  No Known Allergies  Medications Reviewed Today     Reviewed by Ranelle Oyster, LPN (Licensed Practical Nurse) on 04/22/21 at 1514  Med List Status: <None>   Medication Order Taking? Sig Documenting Provider Last Dose Status Informant  Accu-Chek Softclix Lancets lancets [id number] Yes  [provider]  Taking Active   aspirin 81 MG tablet [id number] Yes Take 1 tablet (81 mg total) by mouth daily. Emeterio Reeve, DO Taking Active   blood glucose meter kit and supplies [id number] Yes Dispense based on patient and insurance preference. Use up to four times daily as directed. (FOR ICD-10 E10.9, E11.9). Emeterio Reeve, DO Taking Active   cilostazol (PLETAL) 100 MG tablet [id number] Yes Take 1 tablet (100 mg total) by mouth 2 (two) times daily. Emeterio Reeve, DO Taking Active   FEROSUL 325 (65 Fe) MG tablet [id number] Yes TAKE 1 TABLET TWICE DAILY WITH MEALS Emeterio Reeve, DO Taking Active   furosemide (LASIX) 20 MG tablet [id number] Yes TAKE 1 TABLET EVERY DAY.  NO REFILLS, OVERDUE FOR VISIT/LABS Emeterio Reeve, DO Taking Active   insulin glargine, 2 Unit Dial, (TOUJEO MAX SOLOSTAR) 300 UNIT/ML Solostar Pen [id number] Yes Inject 16 Units into the skin at bedtime. Increase by 4 units every 4 days until fasting (morning) blood sugar is <150, then stay at that dose. Emeterio Reeve, DO Taking Active            Med Note Dorene Ar Mar 24, 2021 10:11 AM) Taking 42 units as of 03/24/21   Insulin Pen Needle 29G X 12.7MM MISC [id number] Yes 100 each by Does not apply route daily. Please dispense qty, brand, appropriate pen sizing for toujeo solostar Emeterio Reeve, DO Taking Active   Oxycodone HCl 10 MG TABS [id number] Yes Take 0.5-1 tablets (5-10 mg total) by mouth every 6 (six) hours as needed. Emeterio Reeve, DO Taking Active   pantoprazole (PROTONIX) 40 MG tablet [id number] Yes TAKE 1 TABLET EVERY DAY (NO REFILLS, OVERDUE FOR VISIT/LABS) Emeterio Reeve, DO Taking Active   potassium chloride (KLOR-CON) 10 MEQ tablet [id number] Yes Take 1 tablet (10 mEq total) by mouth daily. Emeterio Reeve, DO Taking Active   rosuvastatin (CRESTOR) 40 MG tablet [id number] Yes TAKE 1 TABLET EVERY DAY (NEED MD APPOINTMENT) Emeterio Reeve, DO Taking Active   TRUE METRIX BLOOD GLUCOSE  TEST test strip [id number] Yes TEST UP TO FOUR TIMES DAILY AS DIRECTED Emeterio Reeve, DO Taking Active             Patient Active Problem List   Diagnosis Date Noted   Pilonidal cyst 11/25/2019   AKI (acute kidney injury) ([place]) 11/03/2019   Hyponatremia 11/03/2019   Sepsis ([place]) 11/03/2019   Weakness 11/03/2019   Osteomyelitis of finger of left hand ([place])    Vitamin D deficiency 12/28/2018   Erectile dysfunction 12/28/2018   PVC (premature ventricular contraction) 12/27/2018   Essential hypertension 10/11/2018   Coronary artery disease 10/11/2018   Peripheral vascular disease ([place]) 10/11/2018   Mixed hyperlipidemia 10/11/2018   Osteoarthritis 10/11/2018   CKD (chronic kidney disease) stage 3, GFR 30-59 ml/min ([place]) 10/11/2018   History of anemia 10/11/2018   Other chronic pain 06/08/2018   Vaccine refused by patient 06/07/2018   Abnormal stress test 04/10/2018  Non-traumatic rhabdomyolysis 02/01/2018   Hypokalemia 02/01/2018   Hypophosphatemia 02/01/2018   Perianal abscess 10/16/2017   Nicotine dependence with current use 10/12/2017   Perirectal abscess 10/12/2017   Drug-induced constipation 10/12/2017   GI bleed 09/15/2017   Elevated troponin 06/15/2017   Chronic diastolic heart failure ([place]) 06/15/2017   Normocytic anemia 06/15/2017   Type 2 diabetes mellitus with diabetic neuropathy, with long-term current use of insulin ([place]) 04/24/2017   History of cerebrovascular accident 09/20/2016   Bone lesion 09/29/2014   Finger pain, right 09/29/2014   S/P coronary artery stent placement 12/20/2012   Old myocardial infarction 10/27/2010    Immunization History  Administered Date(s) Administered   Janssen (J&J) SARS-COV-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 12/04/2019   PFIZER(Purple Top)SARS-COV-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 06/10/2020    Conditions to be addressed/monitored: HTN, HLD, and DMII  Care Plan : Medication Management  Updates made by Darius Bump, [place] since 05/17/2021 12:00 AM      Problem: Diabetes, HTN, HLD   Priority: High     Long-Range Goal: Disease Progression Prevention   Start Date: 12/23/2020  Recent Progress: On track  Priority: High  Note:   Current Barriers:  Unable to independently afford treatment regimen Unable to achieve control of diabetes   Pharmacist Clinical Goal(s):  Over the next 30 days, patient will verbalize ability to afford treatment regimen achieve adherence to monitoring guidelines and medication adherence to achieve therapeutic efficacy adhere to prescribed medication regimen as evidenced by fill history & verbal confirmation of patient assistance status  through collaboration with PharmD and provider.   Interventions: 1:1 collaboration with Emeterio Reeve, DO regarding development and update of comprehensive plan of care as evidenced by provider attestation and co-signature Inter-disciplinary care team collaboration (see longitudinal plan of care) Comprehensive medication review performed; medication list updated in electronic medical record  Diabetes:  Uncontrolled: toujeo 32 units daily, reduced dosage due to several BG <70.   Previous glucose readings:  AM PM  98 149  251 162  149 189  75 180  118 119  123 217  147 152  67 189  77   124   99    Denies hypoglycemic/hyperglycemic symptoms  Previously discussed current meal patterns: breakfast: eggs, hashbrown, sausage or bacon, corn beef hash; lunch: skips lunch; dinner: varies - taco, burrito, spaghetti, cheeseburgers, pork sausage; drinks: Mt Dew (2 per day)  Current exercise: limited by residual stroke deficits but does shoulder rotations  Recommended goal of continuing green vegetable on plate 3x per week, NOW ADDING the goal of 2 MtDews per day, maximum, aiming for 1 per day. ,   Recommended continue toujeo at 32 units daily to avoid hypoglycemia. Patient assistance for toujeo ([employer] PAP) approved 12/28/20, medication delivered to primary care office 01/06/21,  90DS will continue to be delivered. Recertification of application for 1696 year can be completed July 22, 2021.      Continue current regimen, collect A1c at next visit with pharmacist in November, may consider GLP1 for ASCVD benefit with goal of reducing insulin, minimizing hypoglycemia, and further achieving BG control. Would initiate patient assistance in order for this to be financially feasible.  Hypertension:  Controlled; current treatment: furosemide 52m daily  Current home readings: not checking at home  Denies hypotensive/hypertensive symptoms  Recommended continue current regimen, patient would ideally benefit from low dose ACEI for renal protection but soft BP  (consider lisinopril 2.568min future)  Hyperlipidemia:  Controlled, current treatment: Rosuvastatin 4024m  Medications previously tried: N/A  Educated  on multiple benefits of statin therapy Recommended continue current regimen  Patient Goals/Self-Care Activities Over the next 30 days, patient will:  take medications as prescribed, check glucose 2x day (morning and afternoon), document, and provide at future appointments, collaborate with provider on medication access solutions, and engage in dietary modifications by choosing a green vegetable at least 3x per week and aim to decrease to 1-2 Mt Dew per day at most  Follow Up Plan: Face to face follow up appointment with care management team member scheduled for:  1 month       Medication Assistance: Patient assistance for toujeo (Sanofi PAP) approved 12/28/20, medication delivered to primary care office 01/06/21, 90DS will continue to be delivered. Recertification of application for 7425 year can be completed July 22, 2021.  Patient's preferred pharmacy is:  [facility] [place], [place] [address] Phone: [phone] Fax: [phone]  [id number] - [place], [place] - [address] [place] [address] Phone: [phone] Fax: [phone]  Uses pill box? Yes Pt endorses 100% compliance  Follow Up:  Patient agrees to Care Plan and Follow-up.  Plan: Face to Face appointment with care management team member scheduled for: 1 month  Larinda Buttery, PharmD Clinical Pharmacist [facility] [phone]

## 2021-05-17 NOTE — Patient Instructions (Signed)
Visit Information  PATIENT GOALS:  Goals Addressed             This Visit's Progress    Medication Management       Patient Goals/Self-Care Activities Over the next 30 days, patient will:  take medications as prescribed, check glucose 2x day (morning and afternoon), document, and provide at future appointments, collaborate with provider on medication access solutions, and engage in dietary modifications by choosing a green vegetable at least 3x per week and aim to decrease to 1-2 Mt Dew per day at most  Follow Up Plan: Face to face follow up appointment with care management team member scheduled for:  1 month        Patient verbalizes understanding of instructions provided today and agrees to view in [place].   Face to Face appointment with care management team member scheduled for:  1 month  Joseph Grimes

## 2021-06-02 ENCOUNTER — Encounter: Payer: Self-pay | Admitting: Medical-Surgical

## 2021-06-02 ENCOUNTER — Ambulatory Visit (INDEPENDENT_AMBULATORY_CARE_PROVIDER_SITE_OTHER): Payer: Medicare HMO | Admitting: Medical-Surgical

## 2021-06-02 VITALS — BP 158/85 | HR 80 | Resp 20 | Ht 69.0 in | Wt 146.0 lb

## 2021-06-02 DIAGNOSIS — E1140 Type 2 diabetes mellitus with diabetic neuropathy, unspecified: Secondary | ICD-10-CM

## 2021-06-02 DIAGNOSIS — Z794 Long term (current) use of insulin: Secondary | ICD-10-CM | POA: Diagnosis not present

## 2021-06-02 DIAGNOSIS — Z7689 Persons encountering health services in other specified circumstances: Secondary | ICD-10-CM | POA: Diagnosis not present

## 2021-06-02 DIAGNOSIS — R030 Elevated blood-pressure reading, without diagnosis of hypertension: Secondary | ICD-10-CM

## 2021-06-02 LAB — POCT UA - MICROALBUMIN
Albumin/Creatinine Ratio, Urine, POC: >300
Creatinine, POC: 100 mg/dL
Microalbumin Ur, POC: 150 mg/L

## 2021-06-02 MED ORDER — OXYCODONE HCL 10 MG PO TABS: 5.0000 mg | ORAL_TABLET | Freq: Four times a day (QID) | ORAL | 0 refills | Status: DC | PRN

## 2021-06-02 MED ORDER — PANTOPRAZOLE SODIUM 40 MG PO TBEC: DELAYED_RELEASE_TABLET | ORAL | 1 refills | Status: DC

## 2021-06-02 NOTE — Addendum Note (Signed)
Addended by: Lynnea Ferrier R on: 06/02/2021 02:59 PM   Modules accepted: Orders

## 2021-06-02 NOTE — Progress Notes (Signed)
  HPI with pertinent ROS:   CC: transfer of care  HPI: Pleasant 60 year old male presenting today to transfer care to a new PCP.    Diabetes-following with clinical care management for diabetes regulation.  Notes his fasting blood sugars have been below 150 most days although he has seen a couple of readings in the low 200s.  Is overdue for his foot exam and microalbumin.  Has not been seen by a podiatrist but does have significant concerns with his feet.  He is afraid to trim his own toenails because it is how he ended up losing several toes on his right foot to infection.  Blood pressure-blood pressure is elevated today although he reports this is unusual for him.  On recheck, his pressure is still elevated.  I reviewed the past medical history, family history, social history, surgical history, and allergies today and no changes were needed.  Please see the problem list section below in epic for further details.   Physical exam:   General: Well Developed, well nourished, and in no acute distress.  Neuro: Alert and oriented x3.  HEENT: Normocephalic, atraumatic.  Skin: Warm and dry. Cardiac: Regular rate and rhythm, no murmurs rubs or gallops, no lower extremity edema.  Respiratory: Clear to auscultation bilaterally. Not using accessory muscles, speaking in full sentences.  Impression and Recommendations:    1. Encounter to establish care Reviewed available information and discussed care concerns with patient.   2. Type 2 diabetes mellitus with diabetic neuropathy, with long-term current use of insulin (HCC) Urine microalbumin completed with high no abnormal results.  Continue working with chronic care management on diabetes care.  Foot exam completed.  Referring to podiatry.  Hemoglobin A1c will be due in 1 month.  Would like to get updated CBC and CMP at that time. - Ambulatory referral to Podiatry  3. Elevated blood pressure reading Blood pressure significantly elevated on  arrival and slightly improved but not at goal on recheck.  Would like him to come back in 2 weeks for nurse visit to recheck this.  If consistently elevated, we will need to start an antihypertensive for better management.  Return in about 3 months (around 09/02/2021) for DM/HTN/HLD/pain follow up.  Return as scheduled for chronic care management appointment next month. ___________________________________________ Clearnce Sorrel, DNP, APRN, FNP-BC Primary [facility] and [place]

## 2021-06-04 ENCOUNTER — Encounter: Payer: Self-pay | Admitting: Podiatry

## 2021-06-04 ENCOUNTER — Ambulatory Visit (INDEPENDENT_AMBULATORY_CARE_PROVIDER_SITE_OTHER): Payer: Medicare HMO | Admitting: Podiatry

## 2021-06-04 ENCOUNTER — Other Ambulatory Visit: Payer: Self-pay

## 2021-06-04 DIAGNOSIS — E1140 Type 2 diabetes mellitus with diabetic neuropathy, unspecified: Secondary | ICD-10-CM

## 2021-06-04 DIAGNOSIS — E1165 Type 2 diabetes mellitus with hyperglycemia: Secondary | ICD-10-CM | POA: Diagnosis not present

## 2021-06-04 DIAGNOSIS — I739 Peripheral vascular disease, unspecified: Secondary | ICD-10-CM | POA: Diagnosis not present

## 2021-06-04 DIAGNOSIS — L97521 Non-pressure chronic ulcer of other part of left foot limited to breakdown of skin: Secondary | ICD-10-CM

## 2021-06-04 MED ORDER — CEPHALEXIN 500 MG PO CAPS: 500.0000 mg | ORAL_CAPSULE | Freq: Four times a day (QID) | ORAL | 0 refills | Status: AC

## 2021-06-04 NOTE — Progress Notes (Signed)
  Subjective:  Patient ID: Joseph Grimes, male    DOB: [birth year],   MRN: [id number]  Chief Complaint  Patient presents with   Foot Pain    The 3rd toe on the left is hurting and has some draining and has been going on for about a month    60 y.o. male presents for concern of left foot third to wound that has been present for about a month. Relates it has been draining but stopped a few days ago. Denies pain.  Relates occasional burning and tinlging. Last A1c 8.7 on 03/24/21. was  Denies any other pedal complaints. Denies n/v/f/c.   PCP: Samuel Bouche NP   Past Medical History:  Diagnosis Date   CKD (chronic kidney disease) stage 3, GFR 30-59 ml/min ([place]) 10/11/2018   Coronary artery disease 10/11/2018   Diabetes ([place])    Diabetes mellitus type 2, controlled, with complications ([place]) 08/14/4942   Essential hypertension 10/11/2018   GERD (gastroesophageal reflux disease)    Heart attack ([place]) 2006   stent placed in right coronary artery   High cholesterol    History of anemia 10/11/2018   History of low potassium    Low hemoglobin    Mixed hyperlipidemia 10/11/2018   Peripheral vascular disease ([place]) 10/11/2018   Stroke ([place])    Tobacco dependence 10/11/2018    Objective:  Physical Exam: Vascular: DP/PT pulses 2/4 bilateral. CFT <3 seconds. Normal hair growth on digits. No edema.  Skin. No lacerations or abrasions bilateral feet. 1 cm x 0.2 cm x0.1 cm wound on plantar left third digit underlying hyperkeratotic tissue. Granular base with mild erythema surrounding. No purulence noted and no probe to bone.  Musculoskeletal: MMT 5/5 bilateral lower extremities in DF, PF, Inversion and Eversion. Deceased ROM in DF of ankle joint.  Neurological: Sensation intact to light touch.   Assessment:   1. Skin ulcer of third toe of left foot, limited to breakdown of skin ([place])   2. Peripheral vascular disease ([place])   3. Poorly controlled type 2 diabetes mellitus with neuropathy ([place])      Plan:   Patient was evaluated and treated and all questions answered. Ulcer left third digit  -Debridement as below. -Dressed with betadine, DSD. -Prescription for keflex provided.   -Discussed glucose control and proper protein-rich diet.  -Discussed if any worsening redness, pain, fever or chills to call or may need to report to the emergency room. Patient expressed understanding.   Procedure: Excisional Debridement of Wound Rationale: Removal of non-viable soft tissue from the wound to promote healing.  Anesthesia: none Pre-Debridement Wound Measurements: Overlying hyperkeratotic tissue  Post-Debridement Wound Measurements: 1 cm x 0.2 cm x 0.1 cm  Type of Debridement: Sharp Excisional Tissue Removed: Non-viable soft tissue Depth of Debridement: subcutaneous tissue. Technique: Sharp excisional debridement to bleeding, viable wound base.  Dressing: Dry, sterile, compression dressing. Disposition: Patient tolerated procedure well. Patient to return in 1 week for follow-up.  Return in about 1 week (around 06/11/2021) for wound check.   Lorenda Peck, DPM

## 2021-06-07 DIAGNOSIS — E118 Type 2 diabetes mellitus with unspecified complications: Secondary | ICD-10-CM

## 2021-06-07 DIAGNOSIS — I10 Essential (primary) hypertension: Secondary | ICD-10-CM | POA: Diagnosis not present

## 2021-06-07 DIAGNOSIS — E782 Mixed hyperlipidemia: Secondary | ICD-10-CM

## 2021-06-11 ENCOUNTER — Ambulatory Visit: Payer: Medicare HMO | Admitting: Podiatry

## 2021-06-16 DIAGNOSIS — E113393 Type 2 diabetes mellitus with moderate nonproliferative diabetic retinopathy without macular edema, bilateral: Secondary | ICD-10-CM | POA: Diagnosis not present

## 2021-06-16 DIAGNOSIS — H524 Presbyopia: Secondary | ICD-10-CM | POA: Diagnosis not present

## 2021-06-16 LAB — HM DIABETES EYE EXAM

## 2021-06-17 ENCOUNTER — Other Ambulatory Visit: Payer: Self-pay

## 2021-06-17 ENCOUNTER — Other Ambulatory Visit: Payer: Self-pay | Admitting: Medical-Surgical

## 2021-06-17 ENCOUNTER — Ambulatory Visit (INDEPENDENT_AMBULATORY_CARE_PROVIDER_SITE_OTHER): Payer: Medicare HMO | Admitting: Medical-Surgical

## 2021-06-17 VITALS — BP 134/65 | HR 83

## 2021-06-17 DIAGNOSIS — R030 Elevated blood-pressure reading, without diagnosis of hypertension: Secondary | ICD-10-CM

## 2021-06-17 NOTE — Progress Notes (Signed)
Established Patient Office Visit  Subjective:  Patient ID: Joseph Grimes, male    DOB: [birth year]  Age: 60 y.o. MRN: [id number]  CC:  Chief Complaint  Patient presents with   Blood Pressure Check    HPI Joseph Grimes presents for blood pressure check. Denies chest pain, shortness of breath or dizziness.   Past Medical History:  Diagnosis Date   CKD (chronic kidney disease) stage 3, GFR 30-59 ml/min (HCC) 10/11/2018   Coronary artery disease 10/11/2018   Diabetes ([place])    Diabetes mellitus type 2, controlled, with complications ([place]) 01/13/3418   Essential hypertension 10/11/2018   GERD (gastroesophageal reflux disease)    Heart attack ([place]) 2006   stent placed in right coronary artery   High cholesterol    History of anemia 10/11/2018   History of low potassium    Low hemoglobin    Mixed hyperlipidemia 10/11/2018   Peripheral vascular disease ([place]) 10/11/2018   Stroke ([place])    Tobacco dependence 10/11/2018    Past Surgical History:  Procedure Laterality Date   AMPUTATION Left 07/19/2019   Procedure: LEFT PARTIAL THUMB AMPUTATION;  Surgeon: Leandrew Koyanagi, MD;  Location: [facility] [place];  Service: Orthopedics;  Laterality: Left;  with digital block   CARDIAC CATHETERIZATION  2006   04/18/18: no stents, stent in '06   FEMORAL-POPLITEAL BYPASS GRAFT Right 05/12/2017   HAND SURGERY     KNEE ARTHROSCOPY     bilat   TOE AMPUTATION     x2   TONSILLECTOMY      Family History  Problem Relation Age of Onset   Diabetes Sister    Diabetes Brother    Heart disease Mother    Heart attack Father    Diabetes Father    Diabetes Sister     Social History   Socioeconomic History   Marital status: Widowed    Spouse name: Not on file   Number of children: 7   Years of education: Not on file   Highest education level: Associate degree: academic program  Occupational History   Occupation: Retired; Disabled.  Tobacco Use   Smoking status: Every Day    Packs/day: 0.50     Types: Cigarettes   Smokeless tobacco: Never   Tobacco comments:    refused  Vaping Use   Vaping Use: Never used  Substance and Sexual Activity   Alcohol use: Not Currently   Drug use: Never   Sexual activity: Not Currently    Partners: Female  Other Topics Concern   Not on file  Social History Narrative   Lives with daughter and 2 grand children. Enjoys spending time with the grand children and watching TV.   Social Determinants of Health   Financial Resource Strain: High Risk   Difficulty of Paying Living Expenses: Hard  Food Insecurity: No Food Insecurity   Worried About Charity fundraiser in the Last Year: Never true   Ran Out of Food in the Last Year: Never true  Transportation Needs: No Transportation Needs   Lack of Transportation (Medical): No   Lack of Transportation (Non-Medical): No  Physical Activity: Inactive   Days of Exercise per Week: 0 days   Minutes of Exercise per Session: 0 min  Stress: No Stress Concern Present   Feeling of Stress : Not at all  Social Connections: Socially Isolated   Frequency of Communication with Friends and Family: More than three times a week   Frequency of Social Gatherings  with Friends and Family: More than three times a week   Attends Religious Services: Never   Active Member of Clubs or Organizations: No   Attends Archivist Meetings: Never   Marital Status: Widowed  Human resources officer Violence: Not At Risk   Fear of Current or Ex-Partner: No   Emotionally Abused: No   Physically Abused: No   Sexually Abused: No    Outpatient Medications Prior to Visit  Medication Sig Dispense Refill   Accu-Chek Softclix Lancets lancets      aspirin 81 MG tablet Take 1 tablet (81 mg total) by mouth daily. 90 tablet 3   blood glucose meter kit and supplies Dispense based on patient and insurance preference. Use up to four times daily as directed. (FOR ICD-10 E10.9, E11.9). 1 each 0   cilostazol (PLETAL) 100 MG tablet Take 1  tablet (100 mg total) by mouth 2 (two) times daily. 180 tablet 3   FEROSUL 325 (65 Fe) MG tablet TAKE 1 TABLET TWICE DAILY WITH MEALS 180 tablet 1   furosemide (LASIX) 20 MG tablet TAKE 1 TABLET EVERY DAY.  NO REFILLS, OVERDUE FOR VISIT/LABS 90 tablet 0   insulin glargine, 2 Unit Dial, (TOUJEO MAX SOLOSTAR) 300 UNIT/ML Solostar Pen Inject 16 Units into the skin at bedtime. Increase by 4 units every 4 days until fasting (morning) blood sugar is <150, then stay at that dose. 12 each 2   Insulin Pen Needle 29G X 12.7MM MISC 100 each by Does not apply route daily. Please dispense qty, brand, appropriate pen sizing for toujeo solostar 100 each 2   [START ON 06/22/2021] Oxycodone HCl 10 MG TABS Take 0.5-1 tablets (5-10 mg total) by mouth every 6 (six) hours as needed. 30 pills must last 90 days 30 tablet 0   pantoprazole (PROTONIX) 40 MG tablet TAKE 1 TABLET EVERY DAY 90 tablet 1   potassium chloride (KLOR-CON) 10 MEQ tablet Take 1 tablet (10 mEq total) by mouth daily. 90 tablet 3   rosuvastatin (CRESTOR) 40 MG tablet Take 1 tablet (40 mg total) by mouth daily. OFFICE VISIT REQUIRED PRIOR TO ANY FURTHER REFILLS 90 tablet 0   TRUE METRIX BLOOD GLUCOSE TEST test strip TEST UP TO FOUR TIMES DAILY AS DIRECTED 200 strip 2   No facility-administered medications prior to visit.    No Known Allergies  ROS Review of Systems    Objective:    Physical Exam  BP 134/65   Pulse 83   SpO2 100%  Wt Readings from Last 3 Encounters:  06/02/21 146 lb (66.2 kg)  01/13/21 137 lb 0.6 oz (62.2 kg)  07/14/20 138 lb 1.9 oz (62.7 kg)     Health Maintenance Due  Topic Date Due   HIV Screening  Never done   Hepatitis C Screening  Never done   TETANUS/TDAP  Never done   COVID-19 Vaccine (3 - Booster for Janssen series) 08/05/2020   OPHTHALMOLOGY EXAM  05/20/2021    There are no preventive care reminders to display for this patient.  Lab Results  Component Value Date   TSH 1.25 11/26/2019   Lab Results   Component Value Date   WBC 11.6 (H) 11/26/2019   HGB 8.5 (L) 11/26/2019   HCT 26.7 (L) 11/26/2019   MCV 102.7 (H) 11/26/2019   PLT 397 11/26/2019   Lab Results  Component Value Date   NA 127 (L) 12/23/2020   K 4.1 12/23/2020   CO2 23 12/23/2020   GLUCOSE 715 ([place]) 12/23/2020  BUN 22 12/23/2020   CREATININE 1.94 (H) 12/23/2020   BILITOT 0.3 12/23/2020   AST 10 12/23/2020   ALT 14 12/23/2020   PROT 6.1 12/23/2020   CALCIUM 9.2 12/23/2020   Lab Results  Component Value Date   CHOL 339 (H) 12/27/2018   Lab Results  Component Value Date   HDL 38 (L) 12/27/2018   Lab Results  Component Value Date   LDLCALC 250 (H) 12/27/2018   Lab Results  Component Value Date   TRIG 300 (H) 12/27/2018   Lab Results  Component Value Date   CHOLHDL 8.9 (H) 12/27/2018   Lab Results  Component Value Date   HGBA1C 8.7 03/24/2021      Assessment & Plan:  Blood pressure - Second check of blood pressure was within normal limits. No medication at this time.   Problem List Items Addressed This Visit   None Visit Diagnoses     Elevated blood pressure reading    -  Primary       No orders of the defined types were placed in this encounter.   Follow-up: No follow-ups on file.    Lavell Luster, [place]

## 2021-06-18 ENCOUNTER — Ambulatory Visit (INDEPENDENT_AMBULATORY_CARE_PROVIDER_SITE_OTHER): Payer: Medicare HMO | Admitting: Podiatry

## 2021-06-18 ENCOUNTER — Encounter: Payer: Self-pay | Admitting: Podiatry

## 2021-06-18 DIAGNOSIS — E1165 Type 2 diabetes mellitus with hyperglycemia: Secondary | ICD-10-CM

## 2021-06-18 DIAGNOSIS — E1140 Type 2 diabetes mellitus with diabetic neuropathy, unspecified: Secondary | ICD-10-CM

## 2021-06-18 DIAGNOSIS — L97521 Non-pressure chronic ulcer of other part of left foot limited to breakdown of skin: Secondary | ICD-10-CM | POA: Diagnosis not present

## 2021-06-18 DIAGNOSIS — I739 Peripheral vascular disease, unspecified: Secondary | ICD-10-CM

## 2021-06-18 MED ORDER — CEPHALEXIN 500 MG PO CAPS: 500.0000 mg | ORAL_CAPSULE | Freq: Four times a day (QID) | ORAL | 0 refills | Status: AC

## 2021-06-18 NOTE — Progress Notes (Signed)
  Subjective:  Patient ID: Joseph Grimes, male    DOB: Jul 27, 1961,   MRN: [id number]  Chief Complaint  Patient presents with   Foot Ulcer    Third toe on the left I am not sure if it is draining and not sure how it looks and still has the same dressing on from the first visit    60 y.o. male presents for follow-up of left third digit ulcer. Relates he missed his last appointment due to car trouble. Has not changed the dressing since. Denies pain or any issues. Finished the abx.  Last A1c 8.7 on 03/24/21. was  Denies any other pedal complaints. Denies n/v/f/c.   PCP: Samuel Bouche NP   Past Medical History:  Diagnosis Date   CKD (chronic kidney disease) stage 3, GFR 30-59 ml/min ([place]) 10/11/2018   Coronary artery disease 10/11/2018   Diabetes ([place])    Diabetes mellitus type 2, controlled, with complications ([place]) 12/11/9739   Essential hypertension 10/11/2018   GERD (gastroesophageal reflux disease)    Heart attack ([place]) 2006   stent placed in right coronary artery   High cholesterol    History of anemia 10/11/2018   History of low potassium    Low hemoglobin    Mixed hyperlipidemia 10/11/2018   Peripheral vascular disease ([place]) 10/11/2018   Stroke ([place])    Tobacco dependence 10/11/2018    Objective:  Physical Exam: Vascular: DP/PT pulses 2/4 bilateral. CFT <3 seconds. Normal hair growth on digits. No edema.  Skin. No lacerations or abrasions bilateral feet. Plantar third digit wound largly healed small 0.1 area with granular base. Latearl thrid tow new wound with granular base measuring about 0.3 cm x 0.5 cm x 0.1 cm. With hyperkeratotic tissue. Mild erythema. No purulence noted and no probe to bone.  Musculoskeletal: MMT 5/5 bilateral lower extremities in DF, PF, Inversion and Eversion. Deceased ROM in DF of ankle joint.  Neurological: Sensation intact to light touch.   Assessment:   1. Skin ulcer of third toe of left foot, limited to breakdown of skin ([place])   2. Peripheral vascular  disease ([place])   3. Poorly controlled type 2 diabetes mellitus with neuropathy ([place])       Plan:  Patient was evaluated and treated and all questions answered. Ulcer left third digit  -Debridement as below. -Dressed with betadine, DSD. -Prescription for keflex provided.   -Discussed glucose control and proper protein-rich diet.  -Discussed if any worsening redness, pain, fever or chills to call or may need to report to the emergency room. Patient expressed understanding.   Procedure: Excisional Debridement of Wound Rationale: Removal of non-viable soft tissue from the wound to promote healing.  Anesthesia: none Pre-Debridement Wound Measurements: 0.2 cm x0.2 cm x 0.1 cm  Post-Debridement Wound Measurements: 0.3 cm x 0.5 cm x 0.1 cm  Type of Debridement: Sharp Excisional Tissue Removed: Non-viable soft tissue Depth of Debridement: subcutaneous tissue. Technique: Sharp excisional debridement to bleeding, viable wound base.  Dressing: Dry, sterile, compression dressing. Disposition: Patient tolerated procedure well. Patient to return in 1 week for follow-up.  Return in about 1 week (around 06/25/2021) for wound check.   Lorenda Peck, DPM

## 2021-06-23 ENCOUNTER — Other Ambulatory Visit: Payer: Self-pay

## 2021-06-23 ENCOUNTER — Other Ambulatory Visit: Payer: Self-pay | Admitting: Osteopathic Medicine

## 2021-06-23 ENCOUNTER — Ambulatory Visit (INDEPENDENT_AMBULATORY_CARE_PROVIDER_SITE_OTHER): Payer: Medicare HMO | Admitting: Pharmacist

## 2021-06-23 DIAGNOSIS — Z794 Long term (current) use of insulin: Secondary | ICD-10-CM | POA: Diagnosis not present

## 2021-06-23 DIAGNOSIS — E782 Mixed hyperlipidemia: Secondary | ICD-10-CM | POA: Diagnosis not present

## 2021-06-23 DIAGNOSIS — I10 Essential (primary) hypertension: Secondary | ICD-10-CM | POA: Diagnosis not present

## 2021-06-23 DIAGNOSIS — E1140 Type 2 diabetes mellitus with diabetic neuropathy, unspecified: Secondary | ICD-10-CM | POA: Diagnosis not present

## 2021-06-23 LAB — POCT GLYCOSYLATED HEMOGLOBIN (HGB A1C): HbA1c, POC (controlled diabetic range): 7.8 % — AB (ref 0.0–7.0)

## 2021-06-23 NOTE — Patient Instructions (Signed)
Visit Information  Patient Goals/Self-Care Activities Over the next 90 days, patient will:  take medications as prescribed, check glucose 2x day (morning and afternoon), document, and provide at future appointments, collaborate with provider on medication access solutions, and engage in dietary modifications by choosing a green vegetable at least 3x per week and aim to decrease to 1-2 Mt Dew per day at most  Follow Up Plan: Face to face follow up appointment with care management team member scheduled for:  3 months  Patient verbalizes understanding of instructions provided today and agrees to view in [place].   Face to Face appointment with care management team member scheduled for:  3 months  Darius Bump

## 2021-06-23 NOTE — Progress Notes (Signed)
Chronic Care Management Pharmacy Note  06/23/2021 Name:  Joseph Grimes MRN:  [id number] DOB:  09/30/1960  Summary: addressed DM, HTN, HLD. Patient taking toujeo 35 units daily. A1c at this visit down to 7.8. Encouraged patient to consider GLP1 for ASCVD benefit with goal of reducing insulin, minimizing hypoglycemia, and further achieving BG control. Patient declines at this time.  Recommendations/Changes made from today's visit: Continue current regimen, completed paperwork for [facility] patient assistance via [employer], faxed 06/23/21. Patient requested refill of pen needles, will facilitate.  Plan: f/u with pharmacist in 3 months  Subjective: Joseph Grimes is an 60 y.o. year old male who is a primary patient of Samuel Bouche, NP.  The CCM team was consulted for assistance with disease management and care coordination needs.    Engaged with patient face to face for follow up visit in response to provider referral for pharmacy case management and/or care coordination services.   Consent to Services:  The patient was given information about Chronic Care Management services, agreed to services, and gave verbal consent prior to initiation of services.  Please see initial visit note for detailed documentation.   Patient Care Team: Samuel Bouche, NP as PCP - General (Nurse Practitioner) Darius Bump, [facility] as Pharmacist (Pharmacist)  Objective:  Lab Results  Component Value Date   CREATININE 1.94 (H) 12/23/2020   CREATININE 1.82 (H) 11/26/2019   CREATININE 1.89 (H) 06/28/2019    Lab Results  Component Value Date   HGBA1C 7.8 (A) 06/23/2021   Last diabetic Eye exam:  Lab Results  Component Value Date/Time   HMDIABEYEEXA Retinopathy (A) 05/20/2020 12:00 AM    Last diabetic Foot exam: No results found for: HMDIABFOOTEX      Component Value Date/Time   CHOL 339 (H) 12/27/2018 1156   TRIG 300 (H) 12/27/2018 1156   HDL 38 (L) 12/27/2018 1156   CHOLHDL 8.9 (H) 12/27/2018 1156    LDLCALC 250 (H) 12/27/2018 1156    Hepatic Function Latest Ref Rng & Units 12/23/2020 11/26/2019 06/28/2019  Total Protein 6.1 - 8.1 g/dL 6.1 5.9(L) 6.2  AST 10 - 35 U/L 10 9(L) 14  ALT 9 - 46 U/L _0 Total Bilirubin 0.2 - 1.2 mg/dL 0.3 0.4 0.3    Lab Results  Component Value Date/Time   TSH 1.25 11/26/2019 11:14 AM    CBC Latest Ref Rng & Units 11/26/2019 12/27/2018 10/12/2018  WBC 3.8 - 10.8 Thousand/uL 11.6(H) 10.7 11.6(H)  Hemoglobin 13.2 - 17.1 g/dL 8.5(L) 14.4 14.7  Hematocrit 38.5 - 50.0 % 26.7(L) 44.1 43.9  Platelets 140 - 400 Thousand/uL 397 331 293    Lab Results  Component Value Date/Time   VD25OH 8 (L) 12/27/2018 11:56 AM     Social History   Tobacco Use  Smoking Status Every Day   Packs/day: 0.50   Types: Cigarettes  Smokeless Tobacco Never  Tobacco Comments   refused   BP Readings from Last 3 Encounters:  06/17/21 134/65  06/02/21 (!) 158/85  03/24/21 116/68   Pulse Readings from Last 3 Encounters:  06/17/21 83  06/02/21 80  03/24/21 97   Wt Readings from Last 3 Encounters:  06/02/21 146 lb (66.2 kg)  01/13/21 137 lb 0.6 oz (62.2 kg)  07/14/20 138 lb 1.9 oz (62.7 kg)    Assessment: Review of patient past medical history, allergies, medications, health status, including review of consultants reports, laboratory and other test data, was performed as part of comprehensive evaluation and provision  of chronic care management services.   SDOH:  (Social Determinants of Health) assessments and interventions performed:    CCM Care Plan  No Known Allergies  Medications Reviewed Today     Reviewed by Darius Bump, [facility] (Pharmacist) on 06/23/21 at 1039  Med List Status: <None>   Medication Order Taking? Sig Documenting Provider Last Dose Status Informant  Accu-Chek Softclix Lancets lancets [id number] Yes  [provider] Taking Active   aspirin 81 MG tablet [id number] Yes Take 1 tablet (81 mg total) by mouth daily. Emeterio Reeve, DO  Taking Active   blood glucose meter kit and supplies [id number] Yes Dispense based on patient and insurance preference. Use up to four times daily as directed. (FOR ICD-10 E10.9, E11.9). Emeterio Reeve, DO Taking Active   cephALEXin (KEFLEX) 500 MG capsule [id number] Yes Take 1 capsule (500 mg total) by mouth 4 (four) times daily for 7 days. Lorenda Peck, MD Taking Active   cilostazol (PLETAL) 100 MG tablet [id number] Yes Take 1 tablet (100 mg total) by mouth 2 (two) times daily. Emeterio Reeve, DO Taking Active   FEROSUL 325 (65 Fe) MG tablet [id number] Yes TAKE 1 TABLET TWICE DAILY WITH MEALS Emeterio Reeve, DO Taking Active   furosemide (LASIX) 20 MG tablet [id number] Yes TAKE 1 TABLET EVERY DAY.  NO REFILLS, OVERDUE FOR VISIT/LABS Emeterio Reeve, DO Taking Active   insulin glargine, 2 Unit Dial, (TOUJEO MAX SOLOSTAR) 300 UNIT/ML Solostar Pen [id number] Yes Inject 16 Units into the skin at bedtime. Increase by 4 units every 4 days until fasting (morning) blood sugar is <150, then stay at that dose.  Patient taking differently: Inject 35 Units into the skin at bedtime. Increase by 4 units every 4 days until fasting (morning) blood sugar is <150, then stay at that dose. (Taking 35 units daily as of 06/23/21)   Emeterio Reeve, DO Taking Active            Med Note Dorene Ar Mar 24, 2021 10:11 AM) Taking 42 units as of 03/24/21   Insulin Pen Needle 29G X 12.7MM MISC [id number] Yes 100 each by Does not apply route daily. Please dispense qty, brand, appropriate pen sizing for toujeo solostar Emeterio Reeve, DO Taking Active   Oxycodone HCl 10 MG TABS [id number] Yes Take 0.5-1 tablets (5-10 mg total) by mouth every 6 (six) hours as needed. 30 pills must last 90 days Samuel Bouche, NP Taking Active   pantoprazole (PROTONIX) 40 MG tablet [id number] Yes TAKE 1 TABLET EVERY DAY Samuel Bouche, NP Taking Active   potassium chloride (KLOR-CON) 10 MEQ tablet [id number] Yes Take 1 tablet (10  mEq total) by mouth daily. Emeterio Reeve, DO Taking Active   rosuvastatin (CRESTOR) 40 MG tablet [id number] Yes Take 1 tablet (40 mg total) by mouth daily. OFFICE VISIT REQUIRED PRIOR TO ANY FURTHER REFILLS Luetta Nutting, DO Taking Active   TRUE METRIX BLOOD GLUCOSE TEST test strip [id number] Yes TEST UP TO FOUR TIMES DAILY AS DIRECTED Emeterio Reeve, DO Taking Active             Patient Active Problem List   Diagnosis Date Noted   Pilonidal cyst 11/25/2019   AKI (acute kidney injury) ([place]) 11/03/2019   Hyponatremia 11/03/2019   Sepsis ([place]) 11/03/2019   Weakness 11/03/2019   Osteomyelitis of finger of left hand ([place])    Vitamin D deficiency 12/28/2018   Erectile dysfunction 12/28/2018   PVC (premature ventricular contraction) 12/27/2018   Essential hypertension 10/11/2018  Coronary artery disease 10/11/2018   Peripheral vascular disease ([place]) 10/11/2018   Mixed hyperlipidemia 10/11/2018   Osteoarthritis 10/11/2018   CKD (chronic kidney disease) stage 3, GFR 30-59 ml/min (HCC) 10/11/2018   History of anemia 10/11/2018   Other chronic pain 06/08/2018   Vaccine refused by patient 06/07/2018   Abnormal stress test 04/10/2018   Non-traumatic rhabdomyolysis 02/01/2018   Hypokalemia 02/01/2018   Hypophosphatemia 02/01/2018   Perianal abscess 10/16/2017   Nicotine dependence with current use 10/12/2017   Perirectal abscess 10/12/2017   Drug-induced constipation 10/12/2017   GI bleed 09/15/2017   Elevated troponin 06/15/2017   Chronic diastolic heart failure ([place]) 06/15/2017   Normocytic anemia 06/15/2017   Type 2 diabetes mellitus with diabetic neuropathy, with long-term current use of insulin ([place]) 04/24/2017   History of cerebrovascular accident 09/20/2016   Bone lesion 09/29/2014   Finger pain, right 09/29/2014   S/P coronary artery stent placement 12/20/2012   Old myocardial infarction 10/27/2010    Immunization History  Administered Date(s) Administered    PFIZER(Purple Top)SARS-COV-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 06/10/2020    Conditions to be addressed/monitored: HTN, HLD, and DMII  Care Plan : Medication Management  Updates made by Darius Bump, [place] since 06/23/2021 12:00 AM     Problem: Diabetes, HTN, HLD   Priority: High     Long-Range Goal: Disease Progression Prevention   Start Date: 12/23/2020  Recent Progress: On track  Priority: High  Note:   Current Barriers:  Unable to independently afford treatment regimen Unable to achieve control of diabetes   Pharmacist Clinical Goal(s):  Over the next 30 days, patient will verbalize ability to afford treatment regimen achieve adherence to monitoring guidelines and medication adherence to achieve therapeutic efficacy adhere to prescribed medication regimen as evidenced by fill history & verbal confirmation of patient assistance status  through collaboration with PharmD and provider.   Interventions: 1:1 collaboration with Emeterio Reeve, DO regarding development and update of comprehensive plan of care as evidenced by provider attestation and co-signature Inter-disciplinary care team collaboration (see longitudinal plan of care) Comprehensive medication review performed; medication list updated in electronic medical record  Diabetes:  Uncontrolled: toujeo 35 units daily  Previous glucose readings:  90-100s  Denies hypoglycemic/hyperglycemic symptoms  Previously discussed current meal patterns: breakfast: eggs, hashbrown, sausage or bacon, corn beef hash; lunch: skips lunch; dinner: varies - taco, burrito, spaghetti, cheeseburgers, pork sausage; drinks: Mt Dew (2 per day)  Current exercise: limited by residual stroke deficits but does shoulder rotations  Recommended goal of continuing green vegetable on plate 3x per week, NOW ADDING the goal of 2 MtDews per day, maximum, aiming for 1 per day. ,   Recommended continue toujeo at 35 units daily. Patient assistance for toujeo ([employer] PAP)  approved 12/28/20, medication delivered to primary care office 01/06/21, 90DS will continue to be delivered. Recertification of application for 8850 year can be completed July 22, 2021.      Continue current regimen, collect A1c at next visit with pharmacist in February, consider GLP1 for ASCVD benefit with goal of reducing insulin, minimizing hypoglycemia, and further achieving BG control. Would initiate patient assistance in order for this to be financially feasible. Patient declines at this time but will keep discussing this option.  Hypertension:  Controlled; current treatment: furosemide 38m daily  Current home readings: not checking at home  Denies hypotensive/hypertensive symptoms  Recommended continue current regimen, patient would ideally benefit from low dose ACEI for renal protection but soft BP  (consider lisinopril 2.571min  future)  Hyperlipidemia:  Controlled, current treatment: Rosuvastatin 23m;   Medications previously tried: N/A  Educated on multiple benefits of statin therapy Recommended continue current regimen  Patient Goals/Self-Care Activities Over the next 90 days, patient will:  take medications as prescribed, check glucose 2x day (morning and afternoon), document, and provide at future appointments, collaborate with provider on medication access solutions, and engage in dietary modifications by choosing a green vegetable at least 3x per week and aim to decrease to 1-2 Mt Dew per day at most  Follow Up Plan: Face to face follow up appointment with care management team member scheduled for:  3 months        Medication Assistance: Patient assistance for toujeo ([employer] PAP) approved 12/28/20, medication delivered to primary care office 01/06/21, 90DS will continue to be delivered. Recertification of application for [id number] can be completed July 22, 2021.  Patient's [facility] is:  [facility] [address]Phone: [phone]: [phone] [id number] [place]Phone: [phone]: [phone] Uses pill box? Yes Pt endorses 100% compliance  Follow Up:  Patient agrees to Care Plan and Follow-up.  Plan: Face to Face appointment with care management team member scheduled for: 3 months  KLarinda Buttery PharmD Clinical Pharmacist [phone]

## 2021-06-25 ENCOUNTER — Encounter: Payer: Self-pay | Admitting: Podiatry

## 2021-06-25 ENCOUNTER — Ambulatory Visit (INDEPENDENT_AMBULATORY_CARE_PROVIDER_SITE_OTHER): Payer: Medicare HMO | Admitting: Podiatry

## 2021-06-25 ENCOUNTER — Other Ambulatory Visit: Payer: Self-pay

## 2021-06-25 DIAGNOSIS — L97521 Non-pressure chronic ulcer of other part of left foot limited to breakdown of skin: Secondary | ICD-10-CM | POA: Diagnosis not present

## 2021-06-25 DIAGNOSIS — I739 Peripheral vascular disease, unspecified: Secondary | ICD-10-CM | POA: Diagnosis not present

## 2021-06-25 DIAGNOSIS — E1140 Type 2 diabetes mellitus with diabetic neuropathy, unspecified: Secondary | ICD-10-CM

## 2021-06-25 DIAGNOSIS — E1165 Type 2 diabetes mellitus with hyperglycemia: Secondary | ICD-10-CM

## 2021-06-25 DIAGNOSIS — L84 Corns and callosities: Secondary | ICD-10-CM | POA: Diagnosis not present

## 2021-06-25 NOTE — Progress Notes (Signed)
  Subjective:  Patient ID: Joseph Grimes, male    DOB: 1960/09/13,   MRN: [id number]  Chief Complaint  Patient presents with   Foot Ulcer    I am not sure how it looks and I am here to see what you say on the 3rd toe left      60 y.o. male presents for follow-up of left third digit ulcer.  Has not changed the dressing since. Denies pain or any issues. Finished the abx.  Last A1c 7.8 on 06/23/21  was  Denies any other pedal complaints. Denies n/v/f/c.   PCP: Samuel Bouche NP   Past Medical History:  Diagnosis Date   CKD (chronic kidney disease) stage 3, GFR 30-59 ml/min ([place]) 10/11/2018   Coronary artery disease 10/11/2018   Diabetes ([place])    Diabetes mellitus type 2, controlled, with complications ([place]) 0/08/6008   Essential hypertension 10/11/2018   GERD (gastroesophageal reflux disease)    Heart attack ([place]) 2006   stent placed in right coronary artery   High cholesterol    History of anemia 10/11/2018   History of low potassium    Low hemoglobin    Mixed hyperlipidemia 10/11/2018   Peripheral vascular disease ([place]) 10/11/2018   Stroke ([place])    Tobacco dependence 10/11/2018    Objective:  Physical Exam: Vascular: DP/PT pulses 2/4 bilateral. CFT <3 seconds. Normal hair growth on digits. No edema.  Skin. No lacerations or abrasions bilateral feet. Plantar third digit wound largly healed small 0.1 area with granular base. Latearl thrid tow new wound healed No  erythema. No purulence noted and no probe to bone.  Musculoskeletal: MMT 5/5 bilateral lower extremities in DF, PF, Inversion and Eversion. Deceased ROM in DF of ankle joint.  Neurological: Sensation intact to light touch.   Assessment:   1. Peripheral vascular disease ([place])   2. Poorly controlled type 2 diabetes mellitus with neuropathy ([place])   3. Skin ulcer of third toe of left foot, limited to breakdown of skin (HCC)   4. Callus of foot        Plan:  Patient was evaluated and treated and all questions answered. Ulcer  left third digit healing well  -Debridement as below. -Dressed with neosporin and bandaid -No additional abx needed.   -Discussed glucose control and proper protein-rich diet.  -Discussed if any worsening redness, pain, fever or chills to call or may need to report to the emergency room. Patient expressed understanding.   Procedure: Excisional Debridement of Wound Rationale: Removal of non-viable soft tissue from the wound to promote healing.  Anesthesia: none Pre-Debridement Wound Measurements: 0.1 cm x0.1 cm x 0.1 cm  Post-Debridement Wound Measurements: 0.1 cm x 0.1 cm x 0.1 cm  Type of Debridement: Sharp Excisional Tissue Removed: Non-viable soft tissue Depth of Debridement: subcutaneous tissue. Technique: Sharp excisional debridement to bleeding, viable wound base.  Dressing: Dry, sterile, compression dressing. Disposition: Patient tolerated procedure well. Patient to return in 1 week for follow-up.  Return in about 2 weeks (around 07/09/2021) for wound check.   Lorenda Peck, DPM

## 2021-07-05 ENCOUNTER — Telehealth: Payer: Self-pay | Admitting: Medical-Surgical

## 2021-07-05 NOTE — Telephone Encounter (Signed)
Pt called.  He wants to talk to you about his last visit.

## 2021-07-06 ENCOUNTER — Ambulatory Visit: Payer: Medicare HMO | Admitting: Pharmacist

## 2021-07-06 MED ORDER — INSULIN PEN NEEDLE 29G X 12.7MM MISC: 100.0000 | Freq: Every day | 2 refills | Status: DC

## 2021-07-06 NOTE — Patient Instructions (Signed)
Visit Information  Thank you for taking time to visit with me today. I sent your pend needles to the pharmacy. Please don't hesitate to contact me if I can be of assistance to you before our next scheduled telephone appointment.  Following are the goals we discussed today:   Patient Goals/Self-Care Activities Over the next 90 days, patient will:   take medications as prescribed, check glucose 2x day (morning and afternoon), document, and provide at future appointments, collaborate with provider on medication access solutions, and engage in dietary modifications by choosing a green vegetable at least 3x per week and aim to decrease to 1-2 Mt Dew per day at most  Follow Up Plan: Face to face follow up appointment with care management team member scheduled for:  3 months  Please call the care guide team at [phone] if you need to cancel or reschedule your appointment.    Patient verbalizes understanding of instructions provided today and agrees to view in [place].   Darius Bump

## 2021-07-06 NOTE — Progress Notes (Signed)
Chronic Care Management Pharmacy Note  07/06/2021 Name:  Joseph Grimes MRN:  [id number] DOB:  [birth year]  Summary: addressed HTN, HLD, and primarily DM. Patient calling to office to relay he still needs pen needles, and is now running low.  [Previous visit details: patient taking toujeo 35 units daily. A1c at this visit down to 7.8. Encouraged patient to consider GLP1 for ASCVD benefit with goal of reducing insulin, minimizing hypoglycemia, and further achieving BG control. Patient declines at this time.]  Recommendations/Changes made from today's visit: Continue current regimen, completed paperwork for [facility] patient assistance via [employer], faxed 06/23/21. Patient requested refill of pen needles, sent RX 07/06/21 to requested pharmacy, [facility].  Plan: f/u with pharmacist in 3 months  Subjective: Joseph Grimes is an 60 y.o. year old male who is a primary patient of Joseph Bouche, NP.  The CCM team was consulted for assistance with disease management and care coordination needs.    Engaged with patient by telephone for follow up visit in response to provider referral for pharmacy case management and/or care coordination services.   Consent to Services:  The patient was given information about Chronic Care Management services, agreed to services, and gave verbal consent prior to initiation of services.  Please see initial visit note for detailed documentation.   Patient Care Team: Joseph Bouche, NP as PCP - General (Nurse Practitioner) Darius Bump, [facility] as Pharmacist (Pharmacist)  Objective:  Lab Results  Component Value Date   CREATININE 1.94 (H) 12/23/2020   CREATININE 1.82 (H) 11/26/2019   CREATININE 1.89 (H) 06/28/2019    Lab Results  Component Value Date   HGBA1C 7.8 (A) 06/23/2021   Last diabetic Eye exam:  Lab Results  Component Value Date/Time   HMDIABEYEEXA Retinopathy (A) 05/20/2020 12:00 AM    Last diabetic Foot exam: No results found for: HMDIABFOOTEX       Component Value Date/Time   CHOL 339 (H) 12/27/2018 1156   TRIG 300 (H) 12/27/2018 1156   HDL 38 (L) 12/27/2018 1156   CHOLHDL 8.9 (H) 12/27/2018 1156   LDLCALC 250 (H) 12/27/2018 1156    Hepatic Function Latest Ref Rng & Units 12/23/2020 11/26/2019 06/28/2019  Total Protein 6.1 - 8.1 g/dL 6.1 5.9(L) 6.2  AST 10 - 35 U/L 10 9(L) 14  ALT 9 - 46 U/L 14 9 11   Total Bilirubin 0.2 - 1.2 mg/dL 0.3 0.4 0.3    Lab Results  Component Value Date/Time   TSH 1.25 11/26/2019 11:14 AM    CBC Latest Ref Rng & Units 11/26/2019 12/27/2018 10/12/2018  WBC 3.8 - 10.8 Thousand/uL 11.6(H) 10.7 11.6(H)  Hemoglobin 13.2 - 17.1 g/dL 8.5(L) 14.4 14.7  Hematocrit 38.5 - 50.0 % 26.7(L) 44.1 43.9  Platelets 140 - 400 Thousand/uL 397 331 293    Lab Results  Component Value Date/Time   VD25OH 8 (L) 12/27/2018 11:56 AM     Social History   Tobacco Use  Smoking Status Every Day   Packs/day: 0.50   Types: Cigarettes  Smokeless Tobacco Never  Tobacco Comments   refused   BP Readings from Last 3 Encounters:  06/17/21 134/65  06/02/21 (!) 158/85  03/24/21 116/68   Pulse Readings from Last 3 Encounters:  06/17/21 83  06/02/21 80  03/24/21 97   Wt Readings from Last 3 Encounters:  06/02/21 146 lb (66.2 kg)  01/13/21 137 lb 0.6 oz (62.2 kg)  07/14/20 138 lb 1.9 oz (62.7 kg)    Assessment: Review of patient  past medical history, allergies, medications, health status, including review of consultants reports, laboratory and other test data, was performed as part of comprehensive evaluation and provision of chronic care management services.   SDOH:  (Social Determinants of Health) assessments and interventions performed:    CCM Care Plan  No Known Allergies  Medications Reviewed Today     Reviewed by Joseph Peck, MD (Physician) on 06/25/21 at 1018  Med List Status: <None>   Medication Order Taking? Sig Documenting Provider Last Dose Status Informant  Accu-Chek Softclix Lancets  lancets [id number] No  [provider] Taking Active   aspirin 81 MG tablet [id number] No Take 1 tablet (81 mg total) by mouth daily. Joseph Reeve, DO Taking Active   blood glucose meter kit and supplies [id number] No Dispense based on patient and insurance preference. Use up to four times daily as directed. (FOR ICD-10 E10.9, E11.9). Joseph Reeve, DO Taking Active   cephALEXin (KEFLEX) 500 MG capsule [id number] No Take 1 capsule (500 mg total) by mouth 4 (four) times daily for 7 days. Joseph Peck, MD Taking Active   cilostazol (PLETAL) 100 MG tablet [id number] No Take 1 tablet (100 mg total) by mouth 2 (two) times daily. Joseph Reeve, DO Taking Active   FEROSUL 325 [id number] Fe) MG tablet [id number] No TAKE 1 TABLET TWICE DAILY WITH MEALS Joseph Reeve, DO Taking Active   furosemide (LASIX) 20 MG tablet [id number]  TAKE 1 TABLET EVERY DAY. Joseph Bouche, NP  Active   insulin glargine, 2 Unit Dial, (TOUJEO MAX SOLOSTAR) 300 UNIT/ML Solostar Pen [id number] No Inject 16 Units into the skin at bedtime. Increase by 4 units every 4 days until fasting (morning) blood sugar is <150, then stay at that dose.  Patient taking differently: Inject 35 Units into the skin at bedtime. Increase by 4 units every 4 days until fasting (morning) blood sugar is <150, then stay at that dose. (Taking 35 units daily as of 06/23/21)   Joseph Reeve, DO Taking Active            Med Note Joseph Grimes Mar 24, 2021 10:11 AM) Taking 42 units as of 03/24/21   Insulin Pen Needle 29G X 12.7MM MISC [id number] No 100 each by Does not apply route daily. Please dispense qty, brand, appropriate pen sizing for toujeo solostar Joseph Reeve, DO Taking Active   Oxycodone HCl 10 MG TABS [id number] No Take 0.5-1 tablets (5-10 mg total) by mouth every 6 (six) hours as needed. 30 pills must last 90 days Joseph Bouche, NP Taking Active   pantoprazole (PROTONIX) 40 MG tablet [id number] No TAKE 1 TABLET EVERY DAY  Grimes, Joy, NP Taking Active   potassium chloride (KLOR-CON) 10 MEQ tablet [id number] No Take 1 tablet (10 mEq total) by mouth daily. Joseph Reeve, DO Taking Active   rosuvastatin (CRESTOR) 40 MG tablet [id number] No Take 1 tablet (40 mg total) by mouth daily. OFFICE VISIT REQUIRED PRIOR TO ANY FURTHER REFILLS Luetta Nutting, DO Taking Active   TRUE METRIX BLOOD GLUCOSE TEST test strip [id number] No TEST UP TO FOUR TIMES DAILY AS DIRECTED Joseph Reeve, DO Taking Active             Patient Active Problem List   Diagnosis Date Noted   Pilonidal cyst 11/25/2019   AKI (acute kidney injury) ([place]) 11/03/2019   Hyponatremia 11/03/2019   Sepsis ([place]) 11/03/2019   Weakness 11/03/2019   Osteomyelitis of finger of left hand ([place])    Vitamin D  deficiency 12/28/2018   Erectile dysfunction 12/28/2018   PVC (premature ventricular contraction) 12/27/2018   Essential hypertension 10/11/2018   Coronary artery disease 10/11/2018   Peripheral vascular disease ([place]) 10/11/2018   Mixed hyperlipidemia 10/11/2018   Osteoarthritis 10/11/2018   CKD (chronic kidney disease) stage 3, GFR 30-59 ml/min (HCC) 10/11/2018   History of anemia 10/11/2018   Other chronic pain 06/08/2018   Vaccine refused by patient 06/07/2018   Abnormal stress test 04/10/2018   Non-traumatic rhabdomyolysis 02/01/2018   Hypokalemia 02/01/2018   Hypophosphatemia 02/01/2018   Perianal abscess 10/16/2017   Nicotine dependence with current use 10/12/2017   Perirectal abscess 10/12/2017   Drug-induced constipation 10/12/2017   GI bleed 09/15/2017   Elevated troponin 06/15/2017   Chronic diastolic heart failure ([place]) 06/15/2017   Normocytic anemia 06/15/2017   Type 2 diabetes mellitus with diabetic neuropathy, with long-term current use of insulin ([place]) 04/24/2017   History of cerebrovascular accident 09/20/2016   Bone lesion 09/29/2014   Finger pain, right 09/29/2014   S/P coronary artery stent placement 12/20/2012    Old myocardial infarction 10/27/2010    Immunization History  Administered Date(s) Administered   PFIZER(Purple Top)SARS-COV-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 06/10/2020    Conditions to be addressed/monitored: HTN, HLD, and DMII  Care Plan : Medication Management  Updates made by Darius Bump, [place] since 07/06/2021 12:00 AM     Problem: Diabetes, HTN, HLD   Priority: High     Long-Range Goal: Disease Progression Prevention   Start Date: 12/23/2020  Recent Progress: On track  Priority: High  Note:   Current Barriers:  Unable to independently afford treatment regimen Unable to achieve control of diabetes   Pharmacist Clinical Goal(s):  Over the next 30 days, patient will verbalize ability to afford treatment regimen achieve adherence to monitoring guidelines and medication adherence to achieve therapeutic efficacy adhere to prescribed medication regimen as evidenced by fill history & verbal confirmation of patient assistance status  through collaboration with PharmD and provider.   Interventions: 1:1 collaboration with Joseph Reeve, DO regarding development and update of comprehensive plan of care as evidenced by provider attestation and co-signature Inter-disciplinary care team collaboration (see longitudinal plan of care) Comprehensive medication review performed; medication list updated in electronic medical record  Diabetes:  Uncontrolled: toujeo 35 units daily  Previous glucose readings:  90-100s  Denies hypoglycemic/hyperglycemic symptoms  Previously discussed current meal patterns: breakfast: eggs, hashbrown, sausage or bacon, corn beef hash; lunch: skips lunch; dinner: varies - taco, burrito, spaghetti, cheeseburgers, pork sausage; drinks: Mt Dew (2 per day)  Current exercise: limited by residual stroke deficits but does shoulder rotations  Recommended goal of continuing green vegetable on plate 3x per week, NOW ADDING the goal of 2 MtDews per day, maximum, aiming for 1 per day.  ,   Recommended continue toujeo at 35 units daily. Patient assistance for toujeo ([employer] PAP) approved 12/28/20, medication delivered to primary care office 01/06/21, 90DS will continue to be delivered. Recertification of application for 8850 year can be completed July 22, 2021.       Continue current regimen, collect A1c at next visit with pharmacist in February, consider GLP1 for ASCVD benefit with goal of reducing insulin, minimizing hypoglycemia, and further achieving BG control. Would initiate patient assistance in order for this to be financially feasible. Patient declines at this time but will keep discussing this option.  Hypertension:  Controlled; current treatment: furosemide 72m daily  Current home readings: not checking at home  Denies hypotensive/hypertensive symptoms  Recommended  continue current regimen, patient would ideally benefit from low dose ACEI for renal protection but soft BP  (consider lisinopril 2.45m in future)  Hyperlipidemia:  Controlled, current treatment: Rosuvastatin 427m   Medications previously tried: N/A  Educated on multiple benefits of statin therapy Recommended continue current regimen  Patient Goals/Self-Care Activities Over the next 90 days, patient will:   take medications as prescribed, check glucose 2x day (morning and afternoon), document, and provide at future appointments, collaborate with provider on medication access solutions, and engage in dietary modifications by choosing a green vegetable at least 3x per week and aim to decrease to 1-2 Mt Dew per day at most  Follow Up Plan: Face to face follow up appointment with care management team member scheduled for:  3 months         Medication Assistance: Patient assistance for toujeo ([employer] PAP) approved 12/28/20, medication delivered to primary care office 01/06/21, 90DS will continue to be delivered. Recertification of application for 201610ear can be completed July 22, 2021.  Patient[facility]  [id number] [address] [place] [id number]: [phone]: [phone] pill box? Yes Pt endorses 100% compliance  Follow Up:  Patient agrees to Care Plan and Follow-up.  Plan: Face to Face appointment with care management team member scheduled for: 3 months  KeLarinda ButteryPharmD Clinical Pharmacist [phone]

## 2021-07-06 NOTE — Telephone Encounter (Signed)
Attempted callback x2. Will continue to try throughout today, if unable to reach, will forward to scheduling for placing a telephone appointment date/time.  Darius Bump

## 2021-07-07 DIAGNOSIS — E782 Mixed hyperlipidemia: Secondary | ICD-10-CM

## 2021-07-07 DIAGNOSIS — I10 Essential (primary) hypertension: Secondary | ICD-10-CM

## 2021-07-07 DIAGNOSIS — Z794 Long term (current) use of insulin: Secondary | ICD-10-CM | POA: Diagnosis not present

## 2021-07-07 DIAGNOSIS — E1140 Type 2 diabetes mellitus with diabetic neuropathy, unspecified: Secondary | ICD-10-CM | POA: Diagnosis not present

## 2021-07-09 ENCOUNTER — Encounter: Payer: Self-pay | Admitting: Podiatry

## 2021-07-09 ENCOUNTER — Other Ambulatory Visit: Payer: Self-pay

## 2021-07-09 ENCOUNTER — Ambulatory Visit (INDEPENDENT_AMBULATORY_CARE_PROVIDER_SITE_OTHER): Payer: Medicare HMO | Admitting: Podiatry

## 2021-07-09 DIAGNOSIS — B351 Tinea unguium: Secondary | ICD-10-CM | POA: Diagnosis not present

## 2021-07-09 DIAGNOSIS — M79675 Pain in left toe(s): Secondary | ICD-10-CM | POA: Diagnosis not present

## 2021-07-09 DIAGNOSIS — M79674 Pain in right toe(s): Secondary | ICD-10-CM

## 2021-07-09 DIAGNOSIS — I739 Peripheral vascular disease, unspecified: Secondary | ICD-10-CM | POA: Diagnosis not present

## 2021-07-09 DIAGNOSIS — L84 Corns and callosities: Secondary | ICD-10-CM | POA: Diagnosis not present

## 2021-07-09 DIAGNOSIS — E1165 Type 2 diabetes mellitus with hyperglycemia: Secondary | ICD-10-CM | POA: Diagnosis not present

## 2021-07-09 DIAGNOSIS — E1140 Type 2 diabetes mellitus with diabetic neuropathy, unspecified: Secondary | ICD-10-CM | POA: Diagnosis not present

## 2021-07-09 NOTE — Progress Notes (Signed)
  Subjective:  Patient ID: Joseph Grimes, male    DOB: 1961/01/08,   MRN: [id number]  Chief Complaint  Patient presents with   Wound Check    F/U Lt 3rd toe check -pt states," looks about the same." -per pt not much improvement - no redness/swelling/drainage tx; none -BS: 90 A1C; 7.8       60 y.o. male presents for follow-up of left third digit ulcer.  Has not changed the bandaid since I last saw him. Denies pain or any issues. Denies drainage. Hoping to have his nails trimmed today. Relates thickened elongated and painful toenails.  Last A1c 7.8 on 06/23/21  was  Denies any other pedal complaints. Denies n/v/f/c.   PCP: Samuel Bouche NP   Past Medical History:  Diagnosis Date   CKD (chronic kidney disease) stage 3, GFR 30-59 ml/min ([place]) 10/11/2018   Coronary artery disease 10/11/2018   Diabetes ([place])    Diabetes mellitus type 2, controlled, with complications ([place]) 10/08/3555   Essential hypertension 10/11/2018   GERD (gastroesophageal reflux disease)    Heart attack ([place]) 2006   stent placed in right coronary artery   High cholesterol    History of anemia 10/11/2018   History of low potassium    Low hemoglobin    Mixed hyperlipidemia 10/11/2018   Peripheral vascular disease ([place]) 10/11/2018   Stroke ([place])    Tobacco dependence 10/11/2018    Objective:  Physical Exam: Vascular: DP/PT pulses 2/4 bilateral. CFT <3 seconds. Normal hair growth on digits. No edema.  Skin. No lacerations or abrasions bilateral feet. Plantar third digit wound largly healed healed after debridement of overlying callus Musculoskeletal: MMT 5/5 bilateral lower extremities in DF, PF, Inversion and Eversion. Deceased ROM in DF of ankle joint. 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  Neurological: Sensation intact to light touch.   Assessment:   1. Callus of foot   2. Peripheral vascular disease ([place])   3. Poorly controlled type 2 diabetes mellitus with neuropathy ([place])   4. Onychomycosis   5. Pain due to  onychomycosis of toenails of both feet         Plan:  Patient was evaluated and treated and all questions answered. Ulcer left third digit healed.   -Overlying hyperkeratotic tissue was debrided and ulcer healed underneath without incident.  -No additional abx needed.   -Discussed glucose control and proper protein-rich diet.  -Discussed if any worsening redness, pain, fever or chills to call or may need to report to the emergency room. Patient expressed understanding.  -Discussed and educated patient on diabetic foot care, especially with  regards to the vascular, neurological and musculoskeletal systems.  -Stressed the importance of good glycemic control and the detriment of not  controlling glucose levels in relation to the foot. -Discussed supportive shoes at all times and checking feet regularly.  -Mechanically debrided all nails 1-5 bilateral using sterile nail nipper and filed with dremel without incident    Return in about 4 weeks (around 08/06/2021) for wound check.   Lorenda Peck, DPM

## 2021-07-21 ENCOUNTER — Other Ambulatory Visit: Payer: Self-pay | Admitting: Family Medicine

## 2021-08-06 ENCOUNTER — Encounter: Payer: Self-pay | Admitting: Podiatry

## 2021-08-06 ENCOUNTER — Ambulatory Visit (INDEPENDENT_AMBULATORY_CARE_PROVIDER_SITE_OTHER): Payer: Medicare HMO | Admitting: Podiatry

## 2021-08-06 ENCOUNTER — Other Ambulatory Visit: Payer: Self-pay

## 2021-08-06 DIAGNOSIS — E1165 Type 2 diabetes mellitus with hyperglycemia: Secondary | ICD-10-CM

## 2021-08-06 DIAGNOSIS — L84 Corns and callosities: Secondary | ICD-10-CM

## 2021-08-06 DIAGNOSIS — E1140 Type 2 diabetes mellitus with diabetic neuropathy, unspecified: Secondary | ICD-10-CM

## 2021-08-06 DIAGNOSIS — I739 Peripheral vascular disease, unspecified: Secondary | ICD-10-CM

## 2021-08-06 NOTE — Progress Notes (Signed)
°  Subjective:  Patient ID: DANIL WEDGE, male    DOB: [birth year],   MRN: [id number]  Chief Complaint  Patient presents with   Wound Check    Right foot 3rd toe wound check      60 y.o. male presents for follow-up of left third digit ulcer.  Doing well and has continued to stay healed.  Denies pain or any issues. Denies drainage.  Last A1c 7.8 on 06/23/21  was  Denies any other pedal complaints. Denies n/v/f/c.   PCP: Samuel Bouche NP   Past Medical History:  Diagnosis Date   CKD (chronic kidney disease) stage 3, GFR 30-59 ml/min ([place]) 10/11/2018   Coronary artery disease 10/11/2018   Diabetes ([place])    Diabetes mellitus type 2, controlled, with complications ([place]) 5/0/9326   Essential hypertension 10/11/2018   GERD (gastroesophageal reflux disease)    Heart attack ([place]) 2006   stent placed in right coronary artery   High cholesterol    History of anemia 10/11/2018   History of low potassium    Low hemoglobin    Mixed hyperlipidemia 10/11/2018   Peripheral vascular disease ([place]) 10/11/2018   Stroke ([place])    Tobacco dependence 10/11/2018    Objective:  Physical Exam: Vascular: DP/PT pulses 2/4 bilateral. CFT <3 seconds. Normal hair growth on digits. No edema.  Skin. No lacerations or abrasions bilateral feet. Plantar third digit wound healed mild callus overlying.  Musculoskeletal: MMT 5/5 bilateral lower extremities in DF, PF, Inversion and Eversion. Deceased ROM in DF of ankle joint. 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  Neurological: Sensation intact to light touch.   Assessment:   1. Peripheral vascular disease ([place])   2. Poorly controlled type 2 diabetes mellitus with neuropathy (HCC)   3. Callus of foot          Plan:  Patient was evaluated and treated and all questions answered. Ulcer left third digit healed.   -Mild hyperkeratotic tissue over lateral third digit was peeled back. No underlying ulcer.  -No additional abx needed.   -Discussed glucose control  and proper protein-rich diet.  -Discussed if any worsening redness, pain, fever or chills to call or may need to report to the emergency room. Patient expressed understanding.  -Discussed and educated patient on diabetic foot care, especially with  regards to the vascular, neurological and musculoskeletal systems.  -Stressed the importance of good glycemic control and the detriment of not  controlling glucose levels in relation to the foot. -Discussed supportive shoes at all times and checking feet regularly.  -Will return in 2 months for [facility].   No follow-ups on file.   Lorenda Peck, DPM

## 2021-08-26 ENCOUNTER — Other Ambulatory Visit: Payer: Self-pay

## 2021-08-26 NOTE — Progress Notes (Signed)
Pt called and requested a refill on his Oxycodone. Last refill sent 06/22/21.Refill pended.

## 2021-08-31 MED ORDER — OXYCODONE HCL 10 MG PO TABS: 5.0000 mg | ORAL_TABLET | Freq: Four times a day (QID) | ORAL | 0 refills | Status: AC | PRN

## 2021-08-31 NOTE — Addendum Note (Signed)
Addended bySamuel Bouche on: 08/31/2021 04:17 PM   Modules accepted: Orders

## 2021-08-31 NOTE — Progress Notes (Signed)
New prescription sent

## 2021-08-31 NOTE — Progress Notes (Signed)
Spoke with [facility] and they said he got #10 tablets on 07/06/2021 and the remaining #20 tablets has been voided. They need a new Rx to dispense the med.   Opioid Naivety Stop:  Federal only allows 7 day supply to see how pt will react to med before they will do a full supply. She said that if this Rx is sent in he will get the #30 tablets. Because that was the first time it was written for them to dispense they were only allowed to dispense 10 because of controlled substance regulations.

## 2021-09-03 ENCOUNTER — Other Ambulatory Visit: Payer: Self-pay

## 2021-09-03 ENCOUNTER — Ambulatory Visit (INDEPENDENT_AMBULATORY_CARE_PROVIDER_SITE_OTHER): Payer: Medicare HMO | Admitting: Medical-Surgical

## 2021-09-03 ENCOUNTER — Encounter: Payer: Self-pay | Admitting: Medical-Surgical

## 2021-09-03 VITALS — BP 138/70 | HR 90 | Resp 20 | Ht 69.0 in | Wt 149.0 lb

## 2021-09-03 DIAGNOSIS — Z794 Long term (current) use of insulin: Secondary | ICD-10-CM | POA: Diagnosis not present

## 2021-09-03 DIAGNOSIS — I10 Essential (primary) hypertension: Secondary | ICD-10-CM

## 2021-09-03 DIAGNOSIS — E782 Mixed hyperlipidemia: Secondary | ICD-10-CM

## 2021-09-03 DIAGNOSIS — Z720 Tobacco use: Secondary | ICD-10-CM

## 2021-09-03 DIAGNOSIS — E1140 Type 2 diabetes mellitus with diabetic neuropathy, unspecified: Secondary | ICD-10-CM | POA: Diagnosis not present

## 2021-09-03 LAB — POCT GLYCOSYLATED HEMOGLOBIN (HGB A1C): HbA1c, POC (controlled diabetic range): 6.7 % (ref 0.0–7.0)

## 2021-09-03 NOTE — Progress Notes (Signed)
°  HPI with pertinent ROS:   CC: Chronic disease follow-up  HPI: Pleasant 61 year old male presenting today for chronic disease follow-up on the following:  Diabetes-checking his blood sugar twice a day, 71 this morning, asymptomatic.  Taking Toujeo 34 units daily, tolerating well without side effects.  Reports he needs a refill.  Hypertension-not regularly checking blood pressure at home.  Taking furosemide 20 mg daily, tolerating well without side effects.  Denies CP, SOB, palpitations, lower extremity edema, dizziness, headaches, or vision changes.  Tobacco abuse-smoking cigarettes, approximately half pack a day.  Has been smoking since he was about 61 years old and has no intention to quit now.  Hyperlipidemia-taking rosuvastatin 40 mg daily, tolerating well without side effects.  Following a low-fat diet.  I reviewed the past medical history, family history, social history, surgical history, and allergies today and no changes were needed.  Please see the problem list section below in epic for further details.   Physical exam:   General: Well Developed, well nourished, and in no acute distress.  Neuro: Alert and oriented x3, extra-ocular muscles intact, sensation grossly intact.  HEENT: Normocephalic, atraumatic, pupils equal round reactive to light, neck supple, no masses, no lymphadenopathy, thyroid nonpalpable.  Skin: Warm and dry, no rashes. Cardiac: Regular rate and rhythm, no murmurs rubs or gallops, no lower extremity edema.  Respiratory: Clear to auscultation bilaterally. Not using accessory muscles, speaking in full sentences.  Impression and Recommendations:    1. Type 2 diabetes mellitus with diabetic neuropathy, with long-term current use of insulin (HCC) POCT hemoglobin A1c explained 7% today.  Doing exceedingly well on solo treatment with 34 units of Toujeo nightly.  Continue this regimen.  Continue a diabetic diet. - POCT glycosylated hemoglobin (Hb A1C)  2.  Essential hypertension Blood pressure elevated at initial check but good at recheck at 138/70.  Continue furosemide 20 mg daily.  Recommend low-sodium diet.  Recommend smoking cessation.  3. Mixed hyperlipidemia Continue Crestor 40 mg daily.  4. Tobacco abuse Discussed smoking cessation.  At risk for negative effects from long-term exposure to cigarette smoke.  Not interested in a low-dose CT screening for lung cancer.  Not at a point where he is considering smoking cessation although he is able to verbalize the risks versus benefits.  Return in about 3 months (around 12/02/2021) for DM/HTN/HLD follow up. ___________________________________________ Clearnce Sorrel, DNP, APRN, FNP-BC [facility] [place]

## 2021-09-15 ENCOUNTER — Ambulatory Visit: Payer: Medicare HMO

## 2021-09-28 ENCOUNTER — Other Ambulatory Visit: Payer: Self-pay | Admitting: Osteopathic Medicine

## 2021-10-06 ENCOUNTER — Ambulatory Visit (INDEPENDENT_AMBULATORY_CARE_PROVIDER_SITE_OTHER): Payer: Medicare HMO | Admitting: Medical-Surgical

## 2021-10-06 ENCOUNTER — Telehealth: Payer: Self-pay

## 2021-10-06 ENCOUNTER — Other Ambulatory Visit: Payer: Self-pay

## 2021-10-06 VITALS — BP 142/65 | HR 83 | Ht 69.0 in | Wt 153.1 lb

## 2021-10-06 DIAGNOSIS — Z0000 Encounter for general adult medical examination without abnormal findings: Secondary | ICD-10-CM | POA: Diagnosis not present

## 2021-10-06 MED ORDER — INSULIN PEN NEEDLE 29G X 12.7MM MISC: 100.0000 | Freq: Every day | 2 refills | Status: DC

## 2021-10-06 NOTE — Progress Notes (Signed)
MEDICARE ANNUAL WELLNESS VISIT  10/06/2021  Subjective:  Joseph Grimes is a 61 y.o. male patient of Samuel Bouche, NP who had a Medicare Annual Wellness Visit today. Anvith is Disabled and lives with their daughter. he has 7 children. he reports that he is socially active and does interact with friends/family regularly. he is minimally physically active and enjoys spending time with the grand children and watching TV.  Patient Care Team: Samuel Bouche, NP as PCP - General (Nurse Practitioner) Darius Bump, [facility] as Pharmacist (Pharmacist)  Advanced Directives 10/06/2021 07/30/2020 07/23/2020 07/19/2019 07/12/2019  Does Patient Have a Medical Advance Directive? No No No No Yes  Type of Advance Directive - - - - Living will;Healthcare Power of Attorney  Does patient want to make changes to medical advance directive? - - - No - Patient declined No - Patient declined  Would patient like information on creating a medical advance directive? No - Patient declined No - Patient declined No - Patient declined No - Patient declined -    Hospital Utilization Over the Past 12 Months: # of hospitalizations or ER visits: 0 # of surgeries: 0  Review of Systems    Patient reports that his overall health is unchanged when compared to last year.  Review of Systems: History obtained from chart review and the patient  All other systems negative.  Pain Assessment Pain : No/denies pain     Current Medications & Allergies (verified) Allergies as of 10/06/2021   No Known Allergies      Medication List        Accurate as of October 06, 2021 10:53 AM. If you have any questions, ask your nurse or doctor.          Accu-Chek Softclix Lancets lancets   aspirin 81 MG tablet Take 1 tablet (81 mg total) by mouth daily.   blood glucose meter kit and supplies Dispense based on patient and insurance preference. Use up to four times daily as directed. (FOR ICD-10 E10.9, E11.9).   cilostazol 100 MG  tablet Commonly known as: PLETAL Take 1 tablet (100 mg total) by mouth 2 (two) times daily.   FeroSul 325 (65 FE) MG tablet Generic drug: ferrous sulfate TAKE 1 TABLET TWICE DAILY WITH MEALS   furosemide 20 MG tablet Commonly known as: LASIX TAKE 1 TABLET EVERY DAY.   Insulin Pen Needle 29G X 12.7MM Misc 100 each by Does not apply route daily. Please dispense qty, brand, appropriate pen sizing for toujeo solostar   Oxycodone HCl 10 MG Tabs Take 0.5-1 tablets (5-10 mg total) by mouth every 6 (six) hours as needed. 30 pills must last 90 days   pantoprazole 40 MG tablet Commonly known as: PROTONIX TAKE 1 TABLET EVERY DAY   potassium chloride 10 MEQ tablet Commonly known as: KLOR-CON M Take 1 tablet (10 mEq total) by mouth daily.   rosuvastatin 40 MG tablet Commonly known as: CRESTOR TAKE 1 TABLET (40 MG TOTAL) BY MOUTH DAILY. OFFICE VISIT REQUIRED PRIOR TO ANY FURTHER REFILLS   Toujeo Max SoloStar 300 UNIT/ML Solostar Pen Generic drug: insulin glargine (2 Unit Dial) Inject 16 Units into the skin at bedtime. Increase by 4 units every 4 days until fasting (morning) blood sugar is <150, then stay at that dose. What changed:  how much to take additional instructions   True Metrix Blood Glucose Test test strip Generic drug: glucose blood TEST UP TO FOUR TIMES DAILY AS DIRECTED  History (reviewed): Past Medical History:  Diagnosis Date   CKD (chronic kidney disease) stage 3, GFR 30-59 ml/min (HCC) 10/11/2018   Coronary artery disease 10/11/2018   Diabetes ([place])    Diabetes mellitus type 2, controlled, with complications ([place]) 09/17/4763   Essential hypertension 10/11/2018   GERD (gastroesophageal reflux disease)    Heart attack ([place]) 2006   stent placed in right coronary artery   High cholesterol    History of anemia 10/11/2018   History of low potassium    Low hemoglobin    Mixed hyperlipidemia 10/11/2018   Peripheral vascular disease ([place]) 10/11/2018   Stroke ([place])     Tobacco dependence 10/11/2018   Past Surgical History:  Procedure Laterality Date   AMPUTATION Left 07/19/2019   Procedure: LEFT PARTIAL THUMB AMPUTATION;  Surgeon: Leandrew Koyanagi, MD;  Location: [facility];  Service: Orthopedics;  Laterality: Left;  with digital block   CARDIAC CATHETERIZATION  2006   04/18/18: no stents, stent in '06   FEMORAL-POPLITEAL BYPASS GRAFT Right 05/12/2017   HAND SURGERY     KNEE ARTHROSCOPY     bilat   TOE AMPUTATION     x2   TONSILLECTOMY     Family History  Problem Relation Age of Onset   Diabetes Sister    Diabetes Brother    Heart disease Mother    Heart attack Father    Diabetes Father    Diabetes Sister    Social History   Socioeconomic History   Marital status: Widowed    Spouse name: Not on file   Number of children: 7   Years of education: 14   Highest education level: Associate degree: academic program  Occupational History   Occupation: Retired; Disabled.  Tobacco Use   Smoking status: Every Day    Packs/day: 0.50    Types: Cigarettes   Smokeless tobacco: Never   Tobacco comments:    refused  Vaping Use   Vaping Use: Never used  Substance and Sexual Activity   Alcohol use: Not Currently   Drug use: Never   Sexual activity: Not Currently    Partners: Female  Other Topics Concern   Not on file  Social History Narrative   Lives with daughter and 2 grand children. Enjoys spending time with the grand children and watching TV.   Social Determinants of Health   Financial Resource Strain: Low Risk    Difficulty of Paying Living Expenses: Not hard at all  Food Insecurity: No Food Insecurity   Worried About Charity fundraiser in the Last Year: Never true   [place] in the Last Year: Never true  Transportation Needs: No Transportation Needs   Lack of Transportation (Medical): No   Lack of Transportation (Non-Medical): No  Physical Activity: Inactive   Days of Exercise per Week: 0 days   Minutes of  Exercise per Session: 0 min  Stress: No Stress Concern Present   Feeling of Stress : Not at all  Social Connections: Socially Isolated   Frequency of Communication with Friends and Family: More than three times a week   Frequency of Social Gatherings with Friends and Family: More than three times a week   Attends Religious Services: Never   Marine scientist or Organizations: No   Attends Archivist Meetings: Never   Marital Status: Widowed    Activities of Daily Living In your present state of health, do you have any difficulty performing the following  activities: 10/06/2021  Hearing? N  Vision? N  Difficulty concentrating or making decisions? N  Walking or climbing stairs? N  Dressing or bathing? N  Doing errands, shopping? N  Preparing Food and eating ? N  Using the Toilet? N  In the past six months, have you accidently leaked urine? N  Do you have problems with loss of bowel control? N  Managing your Medications? N  Managing your Finances? N  Housekeeping or managing your Housekeeping? N  Some recent data might be hidden    Patient Education/Literacy How often do you need to have someone help you when you read instructions, pamphlets, or other written materials from your doctor or pharmacy?: 1 - Never What is the last grade level you completed in school?: Associates degree  Exercise Current Exercise Habits: The patient does not participate in regular exercise at present, Exercise limited by: neurologic condition(s)  Diet Patient reports consuming 2 meals a day and 2 snack(s) a day Patient reports that his primary diet is: Regular Patient reports that she does have regular access to food.   Depression Screen PHQ 2/9 Scores 10/06/2021 09/03/2021 01/13/2021 07/30/2020 07/23/2020 10/11/2018  PHQ - 2 Score 0 0 0 0 0 0     Fall Risk Fall Risk  10/06/2021 01/13/2021 07/30/2020 07/23/2020 06/24/2019  Falls in the past year? 0 1 0 0 0  Number falls in past yr: 0 1 - 0 0   Injury with Fall? 0 0 - 0 0  Risk for fall due to : No Fall Risks Impaired balance/gait - No Fall Risks -  Follow up Falls evaluation completed Falls evaluation completed - Falls evaluation completed -     Objective:   BP (!) 142/65 (BP Location: Left Arm, Patient Position: Sitting, Cuff Size: Normal)    Pulse 83    Ht 5' 9"  (1.753 m)    Wt 153 lb 1.9 oz (69.5 kg)    SpO2 97%    BMI 22.61 kg/m   Last Weight  Most recent update: 10/06/2021 10:32 AM    Weight  69.5 kg (153 lb 1.9 oz)             Body mass index is 22.61 kg/m.  Hearing/Vision  Abdulai did not have difficulty with hearing/understanding during the face-to-face interview Ankush did not have difficulty with his vision during the face-to-face interview Reports that he has had a formal eye exam by an eye care professional within the past year Reports that he has not had a formal hearing evaluation within the past year  Cognitive Function: 6CIT Screen 10/06/2021 07/23/2020  What Year? 0 points 0 points  What month? 0 points 0 points  What time? 0 points 0 points  Count back from 20 0 points 0 points  Months in reverse 0 points 0 points  Repeat phrase 0 points 0 points  Total Score 0 0    Normal Cognitive Function Screening: Yes (Normal:0-7, Significant for Dysfunction: >8)  Immunization & Health Maintenance Record Immunization History  Administered Date(s) Administered   Janssen (J&J) SARS-COV-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 12/04/2019   PFIZER(Purple Top)SARS-COV-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 06/10/2020    Health Maintenance  Topic Date Due   OPHTHALMOLOGY EXAM  10/06/2021 (Originally 05/20/2021)   COVID-19 Vaccine (3 - Booster for Janssen series) 10/22/2021 (Originally 08/05/2020)   INFLUENZA VACCINE  11/05/2021 (Originally 03/08/2021)   Zoster Vaccines- Shingrix (1 of 2) 01/06/2022 (Originally 07/28/1978)   TETANUS/TDAP  10/07/2022 (Originally 07/28/1978)   Hepatitis C Screening  10/07/2022 (Originally 07/28/1977)  HIV Screening   10/07/2022 (Originally 07/28/1974)   HEMOGLOBIN A1C  03/03/2022   FOOT EXAM  06/02/2022   URINE MICROALBUMIN  06/02/2022   COLONOSCOPY (Pts 45-67yr Insurance coverage will need to be confirmed)  07/19/2027   HPV VACCINES  Aged Out       Assessment  This is a routine wellness examination for [employer]  Health Maintenance: Due or Overdue There are no preventive care reminders to display for this patient.   LJanice Coffindoes not need a referral for Community Assistance: Care Management:   no Social Work:    no Prescription Assistance:  no Nutrition/Diabetes Education:  no   Plan:  Personalized Goals  Goals Addressed               This Visit's Progress     Patient Stated (pt-stated)        10/06/2021 AWV Goal: Diabetes Management  Patient will maintain an A1C level below 7.0 Patient will not develop any diabetic foot complications Patient will not experience any hypoglycemic episodes over the next 3 months Patient will notify our office of any CBG readings outside of the provider recommended range by calling [phone] will adhere to provider recommendations for diabetes management  Patient Self Management Activities take all medications as prescribed and report any negative side effects monitor and record blood sugar readings as directed adhere to a low carbohydrate diet that incorporates lean proteins, vegetables, whole grains, low glycemic fruits check feet daily noting any sores, cracks, injuries, or callous formations see PCP or podiatrist if he notices any changes in his legs, feet, or toenails Patient will visit PCP and have an A1C level checked every 3 to 6 months as directed  have a yearly eye exam to monitor for vascular changes associated with diabetes and will request that the report be sent to his pcp.  consult with his PCP regarding any changes in his health or new or worsening symptoms        Personalized Health Maintenance &  Screening Recommendations  Td vaccine Shingles vaccine EYE exam-need records Flu shot  Patient declined the vaccines at this time.  Lung Cancer Screening Recommended: no (Low Dose CT Chest recommended if Age 63-80years, 30 pack-year currently smoking OR have quit w/in past 15 years) Hepatitis C Screening recommended: yes HIV Screening recommended: yes  Advanced Directives: Written information was not given per the patient's request.  Referrals & Orders No orders of the defined types were placed in this encounter.   Follow-up Plan Follow-up with JSamuel Bouche NP as planned Please have your eye exam records faxed to our office. Medicare wellness visit in one year. AVS printed and given to the patient.   I have personally reviewed and noted the following in the patients chart:   Medical and social history Use of alcohol, tobacco or illicit drugs  Current medications and supplements Functional ability and status Nutritional status Physical activity Advanced directives List of other physicians Hospitalizations, surgeries, and ER visits in previous 12 months Vitals Screenings to include cognitive, depression, and falls Referrals and appointments  In addition, I have reviewed and discussed with patient certain preventive protocols, quality metrics, and best practice recommendations. A written personalized care plan for preventive services as well as general preventive health recommendations were provided to patient.     BTinnie Gens RN  10/06/2021

## 2021-10-06 NOTE — Telephone Encounter (Signed)
Left msg for patient that the pen needles had been sent and apologized for any inconvenience.  ?

## 2021-10-06 NOTE — Patient Instructions (Addendum)
[place] Maintenance Summary and Written Plan of Care  [name] ,  Thank you for allowing me to perform your Medicare Annual Wellness Visit and for your ongoing commitment to your health.   Health Maintenance & Immunization History Health Maintenance  Topic Date Due   OPHTHALMOLOGY EXAM  10/06/2021 (Originally 05/20/2021)   COVID-19 Vaccine (3 - Booster for Janssen series) 10/22/2021 (Originally 08/05/2020)   INFLUENZA VACCINE  11/05/2021 (Originally 03/08/2021)   Zoster Vaccines- Shingrix (1 of 2) 01/06/2022 (Originally 07/28/1978)   TETANUS/TDAP  10/07/2022 (Originally 07/28/1978)   Hepatitis C Screening  10/07/2022 (Originally 07/28/1977)   HIV Screening  10/07/2022 (Originally 07/28/1974)   HEMOGLOBIN A1C  03/03/2022   FOOT EXAM  06/02/2022   URINE MICROALBUMIN  06/02/2022   COLONOSCOPY (Pts 45-63yrs Insurance coverage will need to be confirmed)  07/19/2027   HPV VACCINES  Aged Out   Immunization History  Administered Date(s) Administered   Engineer, 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-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 12/04/2019   PFIZER(Purple Top)SARS-COV-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 06/10/2020    These are the patient goals that we discussed:  Goals Addressed               This Visit's Progress     Patient Stated (pt-stated)        10/06/2021 AWV Goal: Diabetes Management  Patient will maintain an A1C level below 7.0 Patient will not develop any diabetic foot complications Patient will not experience any hypoglycemic episodes over the next 3 months Patient will notify our office of any CBG readings outside of the provider recommended range by calling [phone] Patient will adhere to provider recommendations for diabetes management  Patient Self Management Activities take all medications as prescribed and report any negative side effects monitor and record blood sugar readings as directed adhere to a low carbohydrate diet that incorporates lean proteins, vegetables, whole grains, low  glycemic fruits check feet daily noting any sores, cracks, injuries, or callous formations see PCP or podiatrist if he notices any changes in his legs, feet, or toenails Patient will visit PCP and have an A1C level checked every 3 to 6 months as directed  have a yearly eye exam to monitor for vascular changes associated with diabetes and will request that the report be sent to his pcp.  consult with his PCP regarding any changes in his health or new or worsening symptoms          This is a list of Health Maintenance Items that are overdue or due now: Td vaccine Shingles vaccine EYE exam-need records Flu shot  Patient declined the vaccines at this time.    Orders/Referrals Placed Today: No orders of the defined types were placed in this encounter.  (Contact our referral department at [phone] if you have not spoken with someone about your referral appointment within the next 5 days)    Follow-up Plan Follow-up with Samuel Bouche, NP as planned Please have your eye exam records faxed to our office. Medicare wellness visit in one year. AVS printed and given to the patient.      Health Maintenance, Male Adopting a healthy lifestyle and getting preventive care are important in promoting health and wellness. Ask your health care provider about: The right schedule for you to have regular tests and exams. Things you can do on your own to prevent diseases and keep yourself healthy. What should I know about diet, weight, and exercise? Eat a healthy diet  Eat a diet that includes plenty of vegetables, fruits, low-fat dairy products,  and lean protein. Do not eat a lot of foods that are high in solid fats, added sugars, or sodium. Maintain a healthy weight Body mass index (BMI) is a measurement that can be used to identify possible weight problems. It estimates body fat based on height and weight. Your health care provider can help determine your BMI and help you achieve or maintain  a healthy weight. Get regular exercise Get regular exercise. This is one of the most important things you can do for your health. Most adults should: Exercise for at least 150 minutes each week. The exercise should increase your heart rate and make you sweat (moderate-intensity exercise). Do strengthening exercises at least twice a week. This is in addition to the moderate-intensity exercise. Spend less time sitting. Even light physical activity can be beneficial. Watch cholesterol and blood lipids Have your blood tested for lipids and cholesterol at 63 years of age, then have this test every 5 years. You may need to have your cholesterol levels checked more often if: Your lipid or cholesterol levels are high. You are older than 63 years of age. You are at high risk for heart disease. What should I know about cancer screening? Many types of cancers can be detected early and may often be prevented. Depending on your health history and family history, you may need to have cancer screening at various ages. This may include screening for: Colorectal cancer. Prostate cancer. Skin cancer. Lung cancer. What should I know about heart disease, diabetes, and high blood pressure? Blood pressure and heart disease High blood pressure causes heart disease and increases the risk of stroke. This is more likely to develop in people who have high blood pressure readings or are overweight. Talk with your health care provider about your target blood pressure readings. Have your blood pressure checked: Every 3-5 years if you are 50-53 years of age. Every year if you are 45 years old or older. If you are between the ages of 63 and 51 and are a current or former smoker, ask your health care provider if you should have a one-time screening for abdominal aortic aneurysm (AAA). Diabetes Have regular diabetes screenings. This checks your fasting blood sugar level. Have the screening done: Once every three years  after age 70 if you are at a normal weight and have a low risk for diabetes. More often and at a younger age if you are overweight or have a high risk for diabetes. What should I know about preventing infection? Hepatitis B If you have a higher risk for hepatitis B, you should be screened for this virus. Talk with your health care provider to find out if you are at risk for hepatitis B infection. Hepatitis C Blood testing is recommended for: Everyone born from [birth year] through 1967. Anyone with known risk factors for hepatitis C. Sexually transmitted infections (STIs) You should be screened each year for STIs, including gonorrhea and chlamydia, if: You are sexually active and are younger than 63 years of age. You are older than 63 years of age and your health care provider tells you that you are at risk for this type of infection. Your sexual activity has changed since you were last screened, and you are at increased risk for chlamydia or gonorrhea. Ask your health care provider if you are at risk. Ask your health care provider about whether you are at high risk for HIV. Your health care provider may recommend a prescription medicine to help prevent HIV infection. If  you choose to take medicine to prevent HIV, you should first get tested for HIV. You should then be tested every 3 months for as long as you are taking the medicine. Follow these instructions at home: Alcohol use Do not drink alcohol if your health care provider tells you not to drink. If you drink alcohol: Limit how much you have to 0-2 drinks a day. Know how much alcohol is in your drink. In the [place] one drink equals one 12 oz bottle of beer (355 mL), one 5 oz glass of wine (148 mL), or one 1 oz glass of hard liquor (44 mL). Lifestyle Do not use any products that contain nicotine or tobacco. These products include cigarettes, chewing tobacco, and vaping devices, such as e-cigarettes. If you need help quitting, ask your health care  provider. Do not use street drugs. Do not share needles. Ask your health care provider for help if you need support or information about quitting drugs. General instructions Schedule regular health, dental, and eye exams. Stay current with your vaccines. Tell your health care provider if: You often feel depressed. You have ever been abused or do not feel safe at home. Summary Adopting a healthy lifestyle and getting preventive care are important in promoting health and wellness. Follow your health care provider's instructions about healthy diet, exercising, and getting tested or screened for diseases. Follow your health care provider's instructions on monitoring your cholesterol and blood pressure. This information is not intended to replace advice given to you by your health care provider. Make sure you discuss any questions you have with your health care provider. Document Revised: 12/14/2020 Document Reviewed: 12/14/2020 Elsevier Patient Education  [place].

## 2021-10-06 NOTE — Telephone Encounter (Signed)
-----   Message from Tinnie Gens, RN sent at 10/06/2021 10:34 AM EST ----- ?Regarding: refill request ?Patient was here for his medicar ewellness visit and requested a refill for Pen Needles to be sent to his pharmacy ([facility]) he requested that the last time he was here but it was never sent. If you could please send it when you have some time. Thank you so much. ? ?

## 2021-10-08 ENCOUNTER — Encounter: Payer: Self-pay | Admitting: Podiatry

## 2021-10-08 ENCOUNTER — Ambulatory Visit (INDEPENDENT_AMBULATORY_CARE_PROVIDER_SITE_OTHER): Payer: Medicare HMO | Admitting: Podiatry

## 2021-10-08 ENCOUNTER — Other Ambulatory Visit: Payer: Self-pay

## 2021-10-08 DIAGNOSIS — E1140 Type 2 diabetes mellitus with diabetic neuropathy, unspecified: Secondary | ICD-10-CM

## 2021-10-08 DIAGNOSIS — E1151 Type 2 diabetes mellitus with diabetic peripheral angiopathy without gangrene: Secondary | ICD-10-CM | POA: Diagnosis not present

## 2021-10-08 DIAGNOSIS — Z89421 Acquired absence of other right toe(s): Secondary | ICD-10-CM | POA: Diagnosis not present

## 2021-10-08 DIAGNOSIS — E1165 Type 2 diabetes mellitus with hyperglycemia: Secondary | ICD-10-CM | POA: Diagnosis not present

## 2021-10-08 DIAGNOSIS — L84 Corns and callosities: Secondary | ICD-10-CM

## 2021-10-08 DIAGNOSIS — I739 Peripheral vascular disease, unspecified: Secondary | ICD-10-CM | POA: Diagnosis not present

## 2021-10-08 DIAGNOSIS — M79674 Pain in right toe(s): Secondary | ICD-10-CM

## 2021-10-08 DIAGNOSIS — B351 Tinea unguium: Secondary | ICD-10-CM

## 2021-10-08 DIAGNOSIS — M79675 Pain in left toe(s): Secondary | ICD-10-CM

## 2021-10-08 NOTE — Progress Notes (Signed)
?  Subjective:  ?Patient ID: Joseph Grimes, male    DOB: [birth year],   MRN: [id number] ? ?Chief Complaint  ?Patient presents with  ? Wound Check  ?  Left foot wound check , patient states it is better since the last visit , no pain ,. No swelling  possible callus is forming   ? ? ? ? ?61 y.o. male presents for follow-up of left third digit ulcer and routine foot care. Relates painful thickened and elongated toenails he is unable to trim himself. As well as callus on his left heel. Third digit wound doing well and has continued to stay healed.  Denies pain or any issues. Denies drainage.  Last A1c 7.8 on 06/23/21  was  Denies any other pedal complaints. Denies n/v/f/c.  ? ?PCP: Samuel Bouche NP  ? ?Past Medical History:  ?Diagnosis Date  ? CKD (chronic kidney disease) stage 3, GFR 30-59 ml/min (HCC) 10/11/2018  ? Coronary artery disease 10/11/2018  ? Diabetes ([place])   ? Diabetes mellitus type 2, controlled, with complications ([place]) 03/12/9092  ? Essential hypertension 10/11/2018  ? GERD (gastroesophageal reflux disease)   ? Heart attack [facility]) 2006  ? stent placed in right coronary artery  ? High cholesterol   ? History of anemia 10/11/2018  ? History of low potassium   ? Low hemoglobin   ? Mixed hyperlipidemia 10/11/2018  ? Peripheral vascular disease ([place]) 10/11/2018  ? Stroke [facility])   ? Tobacco dependence 10/11/2018  ? ? ?Objective:  ?Physical Exam: ?Vascular: DP/PT pulses 2/4 bilateral. CFT <3 seconds. Absent hair growth and xerosis noted to bilateral lower extremity., . No edema.  ?Skin. No lacerations or abrasions bilateral feet. Plantar third digit wound healed mild callus overlying. Hyperkeratotic tissue noted to posterior left heel. Nails 1-5 on left and 145 on right are thickened elongated and discolored with subungual debris.   ?Musculoskeletal: MMT 5/5 bilateral lower extremities in DF, PF, Inversion and Eversion. Deceased ROM in DF of ankle joint. 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  ?Neurological: Sensation intact  to light touch.  ? ?Assessment:  ? ?1. Poorly controlled type 2 diabetes mellitus with neuropathy ([place])   ?2. Peripheral vascular disease ([place])   ?3. Pain due to onychomycosis of toenails of both feet   ?4. Callus of foot   ? ? ? ? ? ? ? ?Plan:  ?Patient was evaluated and treated and all questions answered. ?Ulcer left third digit healed.   ?-Discussed and educated patient on diabetic foot care, especially with  ?regards to the vascular, neurological and musculoskeletal systems.  ?-Stressed the importance of good glycemic control and the detriment of not  ?controlling glucose levels in relation to the foot. ?-Discussed supportive shoes at all times and checking feet regularly.  ?-Mechanically debrided all nails 1-5 bilateral using sterile nail nipper and filed with dremel without incident  ?-Hyperkeratotic lesions debrided without incident.  ?-Answered all patient questions ?-Patient to return  in 3 months for at risk foot care ?-Patient advised to call the office if any problems or questions arise in the meantime. ? ? ? ?Return in about 3 months (around 01/08/2022) for rfc. ? ? ?Lorenda Peck, DPM  ? ? ?

## 2021-10-21 ENCOUNTER — Telehealth: Payer: Self-pay

## 2021-10-21 NOTE — Telephone Encounter (Signed)
Patient called stating he will need the paperwork completed for patient assistance again for his Toujeo.  ?

## 2021-10-26 NOTE — Telephone Encounter (Signed)
I have printed the original copy and printed the patient assistance paperwork from [employer] site. I reached out to the patient to receive an updated insurance card. ?

## 2021-11-02 ENCOUNTER — Telehealth: Payer: Self-pay

## 2021-11-02 NOTE — Telephone Encounter (Signed)
LMVM for the patient to contact the office in reference to his refill request for Oxycodone. Alson, we need an updated copy of insurance for his Patient assistance forms for Toujeo. ?

## 2021-11-09 NOTE — Telephone Encounter (Signed)
Patient brought his updated insurance card to the office and signed the forms for the patient assistance program. ? ?I have completed the forms and placed in providers basket for signature. ?

## 2021-11-09 NOTE — Telephone Encounter (Signed)
Spoke with patient. He is going to come by the office and sign the patient assistance form and bring in the updated copy of his insurance card. ?

## 2021-11-13 ENCOUNTER — Other Ambulatory Visit: Payer: Self-pay | Admitting: Osteopathic Medicine

## 2021-11-22 NOTE — Telephone Encounter (Signed)
I contacted the patient today to let him know that his Toujeo was at the office. ? ?He wanted to know about his Oxycodone. He said that the last 10 tablets that he received was in February. I asked the patient if he as contacted the mail order and he has not.  ? ?The patient is wanting [place] to contact the pharmacy to figure out where his medication is. ? ?FYI ?

## 2021-11-22 NOTE — Telephone Encounter (Signed)
Faxed 11/11/2021 ? ?Fax confirmation successful ?

## 2021-11-22 NOTE — Telephone Encounter (Signed)
Patient was informed that his medication has arrived at the office.  ? ?The patient has picked up his medication. ?

## 2021-11-29 ENCOUNTER — Other Ambulatory Visit: Payer: Self-pay | Admitting: Medical-Surgical

## 2021-12-02 ENCOUNTER — Encounter: Payer: Self-pay | Admitting: Medical-Surgical

## 2021-12-02 ENCOUNTER — Ambulatory Visit (INDEPENDENT_AMBULATORY_CARE_PROVIDER_SITE_OTHER): Payer: Medicare HMO | Admitting: Medical-Surgical

## 2021-12-02 VITALS — BP 166/75 | HR 84 | Resp 20 | Ht 69.0 in | Wt 153.0 lb

## 2021-12-02 DIAGNOSIS — N289 Disorder of kidney and ureter, unspecified: Secondary | ICD-10-CM

## 2021-12-02 DIAGNOSIS — I10 Essential (primary) hypertension: Secondary | ICD-10-CM | POA: Diagnosis not present

## 2021-12-02 DIAGNOSIS — M1990 Unspecified osteoarthritis, unspecified site: Secondary | ICD-10-CM | POA: Diagnosis not present

## 2021-12-02 DIAGNOSIS — E1140 Type 2 diabetes mellitus with diabetic neuropathy, unspecified: Secondary | ICD-10-CM | POA: Diagnosis not present

## 2021-12-02 DIAGNOSIS — Z794 Long term (current) use of insulin: Secondary | ICD-10-CM

## 2021-12-02 DIAGNOSIS — E782 Mixed hyperlipidemia: Secondary | ICD-10-CM

## 2021-12-02 LAB — POCT GLYCOSYLATED HEMOGLOBIN (HGB A1C): HbA1c, POC (controlled diabetic range): 8.6 % — AB (ref 0.0–7.0)

## 2021-12-02 MED ORDER — OXYCODONE HCL 10 MG PO TABS: 5.0000 mg | ORAL_TABLET | Freq: Four times a day (QID) | ORAL | 0 refills | Status: DC | PRN

## 2021-12-02 MED ORDER — VALSARTAN 40 MG PO TABS: 40.0000 mg | ORAL_TABLET | Freq: Every day | ORAL | 3 refills | Status: DC

## 2021-12-02 NOTE — Progress Notes (Signed)
?HPI with pertinent ROS:  ? ?CC: chronic disease follow up ? ?HPI: ?Pleasant 61 year old male presenting today for: ? ?DM- taking Toujeo 34 units nightly. Cut back since he was having hypoglycemic episodes as low as 53. Felt shaky and jittery. Ate a meal or piece of candy which resolved the hypoglycemia and associated symptoms.  ? ?HTN- not currently on medications but was in the past.  Not currently checking blood pressures at home.  He continues to smoke approximately 1/2 pack/day.  Not exercising regularly.  No conscious limitation of sodium in his diet. ? ?Hyperlipidemia-taking rosuvastatin 40 mg daily, tolerating well without side effects. ? ?Chronic pain-taking oxycodone 10 mg as needed for chronic arthritis pain.  Has been on this regimen for several years.  Has had trouble with his pharmacy lately as they will only send him 10 tablets at a time and did not send anything after the beginning of February.  Notes that he does not take Tylenol or ibuprofen for pain management.  Reports that he has also tried multiple pain management efforts in the past including gabapentin and none of those were effective at controlling his pain. ? ?I reviewed the past medical history, family history, social history, surgical history, and allergies today and no changes were needed.  Please see the problem list section below in epic for further details. ? ?Physical exam:  ? ?General: Well Developed, well nourished, and in no acute distress.  ?Neuro: Alert and oriented x3.  ?HEENT: Normocephalic, atraumatic.  ?Skin: Warm and dry. ?Cardiac: Regular rate and rhythm, no murmurs rubs or gallops, no lower extremity edema.  ?Respiratory: Clear to auscultation bilaterally. Not using accessory muscles, speaking in full sentences. ? ?Impression and Recommendations:   ? ?1. Type 2 diabetes mellitus with diabetic neuropathy, with long-term current use of insulin ([place]) ?POCT hemoglobin A1c up to 8.6%.  Was previously 6.7%.  He reports that he  has had several lows in the mornings however his afternoon readings are somewhat elevated.  Recommend watching his dietary intake of carbohydrates and concentrated sweets as this is likely the cause of the elevation.  Checking CMP.  Continue Toujeo, titrate depending on fasting sugars. ?- POCT HgB A1C ?- COMPLETE METABOLIC PANEL WITH GFR ? ?2. Essential hypertension ?Checking labs.  Blood pressure is consistently elevated and has been the last couple of appointments so we will start valsartan 40 mg daily.  Advised to check blood pressures at home if he has a cuff available with a goal of 130/80 or less.  Low-sodium diet.  Increase physical activity as tolerated. ?- CBC with Differential/Platelet ?- COMPLETE METABOLIC PANEL WITH GFR ?- Lipid panel ? ?3. Mixed hyperlipidemia ?Checking lipid panel today.  Continue Crestor 40 mg daily. ?- Lipid panel ? ?4. Osteoarthritis, unspecified osteoarthritis type, unspecified site ?Discussed the use of chronic opioids for osteoarthritis pain.  He has used multiple agents in the past per his report so we will continue oxycodone for now but we may need to look at scheduling this back in the future.  Sending in oxycodone 10 mg tablets, 30 tablets to last 90 days.  Note included to pharmacy to provide all 30 tablets at the same time or if this is not possible to provide 10 tablets for 30-day supply with 2 refills.  Advised patient to notify us if he does not receive his medications as prescribed and we will reach out to his pharmacy. ? ?Return in about 3 weeks (around 12/23/2021) for nurse visit for BP check. ?___________________________________________ ?  Clearnce Sorrel, DNP, APRN, FNP-BC ?Primary Care and Sports Medicine ?[place] ?

## 2021-12-03 LAB — CBC WITH DIFFERENTIAL/PLATELET
Absolute Monocytes: 1038 cells/uL — ABNORMAL HIGH (ref 200–950)
Basophils Absolute: 35 cells/uL (ref 0–200)
Basophils Relative: 0.3 %
Eosinophils Absolute: 354 cells/uL (ref 15–500)
Eosinophils Relative: 3 %
HCT: 41 % (ref 38.5–50.0)
Hemoglobin: 13.6 g/dL (ref 13.2–17.1)
Lymphs Abs: 1322 cells/uL (ref 850–3900)
MCH: 31.7 pg (ref 27.0–33.0)
MCHC: 33.2 g/dL (ref 32.0–36.0)
MCV: 95.6 fL (ref 80.0–100.0)
MPV: 10.7 fL (ref 7.5–12.5)
Monocytes Relative: 8.8 %
Neutro Abs: 9051 cells/uL — ABNORMAL HIGH (ref 1500–7800)
Neutrophils Relative %: 76.7 %
Platelets: 229 10*3/uL (ref 140–400)
RBC: 4.29 10*6/uL (ref 4.20–5.80)
RDW: 13.1 % (ref 11.0–15.0)
Total Lymphocyte: 11.2 %
WBC: 11.8 10*3/uL — ABNORMAL HIGH (ref 3.8–10.8)

## 2021-12-03 LAB — COMPLETE METABOLIC PANEL WITH GFR
AG Ratio: 1.3 (calc) (ref 1.0–2.5)
ALT: 10 U/L (ref 9–46)
AST: 12 U/L (ref 10–35)
Albumin: 3.4 g/dL — ABNORMAL LOW (ref 3.6–5.1)
Alkaline phosphatase (APISO): 175 U/L — ABNORMAL HIGH (ref 35–144)
BUN/Creatinine Ratio: 7 (calc) (ref 6–22)
BUN: 15 mg/dL (ref 7–25)
CO2: 26 mmol/L (ref 20–32)
Calcium: 8.8 mg/dL (ref 8.6–10.3)
Chloride: 107 mmol/L (ref 98–110)
Creat: 2.24 mg/dL — ABNORMAL HIGH (ref 0.70–1.35)
Globulin: 2.7 g/dL (calc) (ref 1.9–3.7)
Glucose, Bld: 205 mg/dL — ABNORMAL HIGH (ref 65–139)
Potassium: 4.1 mmol/L (ref 3.5–5.3)
Sodium: 138 mmol/L (ref 135–146)
Total Bilirubin: 0.4 mg/dL (ref 0.2–1.2)
Total Protein: 6.1 g/dL (ref 6.1–8.1)
eGFR: 32 mL/min/{1.73_m2} — ABNORMAL LOW (ref >=60)

## 2021-12-03 LAB — LIPID PANEL
Cholesterol: 101 mg/dL (ref <200)
HDL: 48 mg/dL (ref >=40)
LDL Cholesterol (Calc): 37 mg/dL (calc)
Non-HDL Cholesterol (Calc): 53 mg/dL (calc) (ref <130)
Total CHOL/HDL Ratio: 2.1 (calc) (ref <5.0)
Triglycerides: 75 mg/dL (ref <150)

## 2021-12-03 NOTE — Addendum Note (Signed)
Addended bySamuel Bouche on: 12/03/2021 07:57 AM ? ? Modules accepted: Orders ? ?

## 2021-12-06 ENCOUNTER — Telehealth: Payer: Self-pay

## 2021-12-06 ENCOUNTER — Other Ambulatory Visit: Payer: Self-pay

## 2021-12-06 MED ORDER — OXYCODONE HCL 10 MG PO TABS: 5.0000 mg | ORAL_TABLET | Freq: Four times a day (QID) | ORAL | 0 refills | Status: DC | PRN

## 2021-12-06 MED ORDER — OXYCODONE HCL 10 MG PO TABS: 5.0000 mg | ORAL_TABLET | Freq: Four times a day (QID) | ORAL | 0 refills | Status: AC | PRN

## 2021-12-06 NOTE — Progress Notes (Signed)
[facility] is needing for you to send 3 separate Rx for this medication in order for them to send 10 tablets monthly. ?

## 2021-12-06 NOTE — Progress Notes (Signed)
3 separate prescription sent in as requested. ? ?___________________________________________ ?Clearnce Sorrel, DNP, APRN, FNP-BC ?Primary Care and Sports Medicine ?[place] ? ?

## 2021-12-08 NOTE — Telephone Encounter (Signed)
Spoke with patient today about his lab results. He is aware and didn't have any questions or concerns. ?

## 2021-12-17 ENCOUNTER — Encounter: Payer: Self-pay | Admitting: Medical-Surgical

## 2021-12-23 ENCOUNTER — Ambulatory Visit: Payer: Medicare HMO

## 2022-01-04 ENCOUNTER — Other Ambulatory Visit: Payer: Self-pay

## 2022-01-04 MED ORDER — INSULIN PEN NEEDLE 29G X 12.7MM MISC: 100.0000 | Freq: Every day | 2 refills | Status: DC

## 2022-01-10 ENCOUNTER — Other Ambulatory Visit: Payer: Self-pay

## 2022-01-10 NOTE — Telephone Encounter (Signed)
The patient is wanting to know when he can come pick up his medication. He only has one pen left.

## 2022-01-12 ENCOUNTER — Ambulatory Visit (INDEPENDENT_AMBULATORY_CARE_PROVIDER_SITE_OTHER): Payer: Medicare HMO | Admitting: Medical-Surgical

## 2022-01-12 DIAGNOSIS — I10 Essential (primary) hypertension: Secondary | ICD-10-CM | POA: Diagnosis not present

## 2022-01-12 MED ORDER — INSULIN PEN NEEDLE 29G X 12.7MM MISC: 100.0000 | Freq: Every day | 2 refills | Status: DC

## 2022-01-12 NOTE — Progress Notes (Signed)
Blood pressure at goal today although diastolic is a little low.  Make sure to stay well-hydrated and monitor blood pressure at home.  Continue Diovan 40 mg daily as prescribed.  ___________________________________________ Clearnce Sorrel, DNP, APRN, FNP-BC Primary Care and Sports Medicine [place]

## 2022-01-12 NOTE — Progress Notes (Signed)
Pt here for nurse BP check.  Pt denies CP, SOB, headaches, dizziness or missed doses of medications.  T. Quanta Robertshaw, CMA ? ?

## 2022-01-12 NOTE — Progress Notes (Signed)
Pt informed of recommendations.  Pt expressed understanding and is agreeable.  Charyl Bigger, CMA

## 2022-01-13 ENCOUNTER — Ambulatory Visit (INDEPENDENT_AMBULATORY_CARE_PROVIDER_SITE_OTHER): Payer: Medicare HMO | Admitting: Podiatry

## 2022-01-13 ENCOUNTER — Encounter: Payer: Self-pay | Admitting: Podiatry

## 2022-01-13 DIAGNOSIS — I739 Peripheral vascular disease, unspecified: Secondary | ICD-10-CM

## 2022-01-13 DIAGNOSIS — E1151 Type 2 diabetes mellitus with diabetic peripheral angiopathy without gangrene: Secondary | ICD-10-CM | POA: Diagnosis not present

## 2022-01-13 DIAGNOSIS — E1140 Type 2 diabetes mellitus with diabetic neuropathy, unspecified: Secondary | ICD-10-CM

## 2022-01-13 DIAGNOSIS — M79675 Pain in left toe(s): Secondary | ICD-10-CM

## 2022-01-13 DIAGNOSIS — E1165 Type 2 diabetes mellitus with hyperglycemia: Secondary | ICD-10-CM

## 2022-01-13 DIAGNOSIS — B351 Tinea unguium: Secondary | ICD-10-CM

## 2022-01-13 DIAGNOSIS — Z89421 Acquired absence of other right toe(s): Secondary | ICD-10-CM | POA: Diagnosis not present

## 2022-01-13 DIAGNOSIS — M79674 Pain in right toe(s): Secondary | ICD-10-CM

## 2022-01-13 NOTE — Progress Notes (Signed)
  Subjective:  Patient ID: Joseph Grimes, male    DOB: [birth year],   MRN: [id number]  Chief Complaint  Patient presents with   Nail Problem    Trim nails    Callouses    I am doing better on the left foot and no swelling and not any draining and does not hurt with shoes      61 y.o. male presents for follow-up of left third digit ulcer and routine foot care. Relates painful thickened and elongated toenails he is unable to trim himself. As well as callus on his left heel. Third digit wound doing well and has continued to stay healed.  Denies pain or any issues. Denies drainage.  Last A1c 8.6 on 12/02/21  was  Denies any other pedal complaints. Denies n/v/f/c.   PCP: Samuel Bouche NP   Past Medical History:  Diagnosis Date   CKD (chronic kidney disease) stage 3, GFR 30-59 ml/min ([place]) 10/11/2018   Coronary artery disease 10/11/2018   Diabetes ([place])    Diabetes mellitus type 2, controlled, with complications ([place]) 0/09/7739   Essential hypertension 10/11/2018   GERD (gastroesophageal reflux disease)    Heart attack ([place]) 2006   stent placed in right coronary artery   High cholesterol    History of anemia 10/11/2018   History of low potassium    Low hemoglobin    Mixed hyperlipidemia 10/11/2018   Peripheral vascular disease ([place]) 10/11/2018   Stroke ([place])    Tobacco dependence 10/11/2018    Objective:  Physical Exam: Vascular: DP/PT pulses 2/4 bilateral. CFT <3 seconds. Absent hair growth and xerosis noted to bilateral lower extremity., . No edema.  Skin. No lacerations or abrasions bilateral feet. Plantar third digit wound healed mild callus overlying. Hyperkeratotic tissue noted to posterior left heel. Nails 1-5 on left and 145 on right are thickened elongated and discolored with subungual debris.   Musculoskeletal: MMT 5/5 bilateral lower extremities in DF, PF, Inversion and Eversion. Deceased ROM in DF of ankle joint. 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  Neurological: Sensation  intact to light touch.   Assessment:   1. Poorly controlled type 2 diabetes mellitus with neuropathy ([place])   2. Peripheral vascular disease (HCC)   3. Pain due to onychomycosis of toenails of both feet          Plan:  Patient was evaluated and treated and all questions answered. Ulcer left third digit healed.   -Discussed and educated patient on diabetic foot care, especially with  regards to the vascular, neurological and musculoskeletal systems.  -Stressed the importance of good glycemic control and the detriment of not  controlling glucose levels in relation to the foot. -Discussed supportive shoes at all times and checking feet regularly.  -Mechanically debrided all nails 1-5 bilateral using sterile nail nipper and filed with dremel without incident  -Answered all patient questions -Patient to return  in 3 months for at risk foot care -Patient advised to call the office if any problems or questions arise in the meantime.    No follow-ups on file.   Lorenda Peck, DPM

## 2022-01-19 ENCOUNTER — Telehealth: Payer: Self-pay | Admitting: Pharmacist

## 2022-01-19 NOTE — Progress Notes (Signed)
I have contacted [employer] about patient's Toujeo medication. The representative stated that the shipment was delivered on April 17th with the 90 day supply and stated that there was a confirmed signature of the packaging which was Samuel Bouche and verified the address which is correct. The patients name should be on the box of the package not on the medicaton. The Representative also stated the next refill for the patient should be 10 business days from June 30 th.  Corrie Mckusick, [place]

## 2022-01-24 NOTE — Telephone Encounter (Signed)
Completed.

## 2022-01-28 ENCOUNTER — Telehealth: Payer: Self-pay | Admitting: Medical-Surgical

## 2022-02-02 ENCOUNTER — Ambulatory Visit (INDEPENDENT_AMBULATORY_CARE_PROVIDER_SITE_OTHER): Payer: Medicare HMO | Admitting: Pharmacist

## 2022-02-02 DIAGNOSIS — E782 Mixed hyperlipidemia: Secondary | ICD-10-CM

## 2022-02-02 DIAGNOSIS — E1140 Type 2 diabetes mellitus with diabetic neuropathy, unspecified: Secondary | ICD-10-CM

## 2022-02-02 DIAGNOSIS — I10 Essential (primary) hypertension: Secondary | ICD-10-CM

## 2022-02-02 NOTE — Progress Notes (Signed)
Chronic Care Management Pharmacy Note  02/02/2022 Name:  Joseph Grimes MRN:  [id number] DOB:  April 05, 1961  Summary: addressed HTN, HLD, and primarily DM. Patient ran out of toujeo over the weekend from 01/28/22 to 01/30/22.   Patient taking toujeo 34 units daily. Encouraged patient to consider GLP1 for ASCVD benefit with goal of reducing insulin, minimizing hypoglycemia, and further achieving BG control.   BG per patient: Low 100s in afternoon  50-70s in mornings. He states he is asymptomatic from hypoglycemia, nevertheless this is concerning.  Recommendations/Changes made from today's visit: Continue current regimen for now, we filled out paperwork for novonordisk patient assistance to begin: - Ozempic 0.85m weekly - Toujeo switch to tresiba so that we can obtain insulin & GLP1 through same company   Plan: f/u with pharmacist in 1 week, he will bring his income statement and his home BP monitor for u[place]ato work through how to use it.  Subjective: Joseph Grimes an 61y.o. year old male who is a primary patient of JSamuel Bouche NP.  The CCM team was consulted for assistance with disease management and care coordination needs.    Engaged with patient by telephone for follow up visit in response to provider referral for pharmacy case management and/or care coordination services.   Consent to Services:  The patient was given information about Chronic Care Management services, agreed to services, and gave verbal consent prior to initiation of services.  Please see initial visit note for detailed documentation.   Patient Care Team: JSamuel Bouche NP as PCP - General (Nurse Practitioner) KDarius Bump [facility] Pharmacist (Pharmacist)  Objective:  Lab Results  Component Value Date   CREATININE 2.24 (H) 12/02/2021   CREATININE 1.94 (H) 12/23/2020   CREATININE 1.82 (H) 11/26/2019    Lab Results  Component Value Date   HGBA1C 8.6 (A) 12/02/2021   Last diabetic Eye exam:  Lab  Results  Component Value Date/Time   HMDIABEYEEXA Retinopathy (A) 06/16/2021 12:00 AM    Last diabetic Foot exam: No results found for: "HMDIABFOOTEX"      Component Value Date/Time   CHOL 101 12/02/2021 0000   TRIG 75 12/02/2021 0000   HDL 48 12/02/2021 0000   CHOLHDL 2.1 12/02/2021 0000   LDLCALC 37 12/02/2021 0000       Latest Ref Rng & Units 12/02/2021   12:00 AM 12/23/2020   12:18 PM 11/26/2019   11:14 AM  Hepatic Function  Total Protein 6.1 - 8.1 g/dL 6.1  6.1  5.9   AST 10 - 35 U/L 12  10  9    ALT 9 - 46 U/L 10  14  9    Total Bilirubin 0.2 - 1.2 mg/dL 0.4  0.3  0.4     Lab Results  Component Value Date/Time   TSH 1.25 11/26/2019 11:14 AM       Latest Ref Rng & Units 12/02/2021   12:00 AM 11/26/2019   11:14 AM 12/27/2018   11:56 AM  CBC  WBC 3.8 - 10.8 Thousand/uL 11.8  11.6  10.7   Hemoglobin 13.2 - 17.1 g/dL 13.6  8.5  14.4   Hematocrit 38.5 - 50.0 % 41.0  26.7  44.1   Platelets 140 - 400 Thousand/uL 229  397  331     Lab Results  Component Value Date/Time   VD25OH 8 (L) 12/27/2018 11:56 AM     Social History   Tobacco Use  Smoking Status Every Day   Packs/day:  0.50   Types: Cigarettes  Smokeless Tobacco Never  Tobacco Comments   refused   BP Readings from Last 3 Encounters:  01/12/22 (!) 113/54  12/02/21 (!) 166/75  10/06/21 (!) 142/65   Pulse Readings from Last 3 Encounters:  01/12/22 69  12/02/21 84  10/06/21 83   Wt Readings from Last 3 Encounters:  12/02/21 153 lb 0.6 oz (69.4 kg)  10/06/21 153 lb 1.9 oz (69.5 kg)  09/03/21 149 lb (67.6 kg)    Assessment: Review of patient past medical history, allergies, medications, health status, including review of consultants reports, laboratory and other test data, was performed as part of comprehensive evaluation and provision of chronic care management services.   SDOH:  (Social Determinants of Health) assessments and interventions performed:    CCM Care Plan  No Known  Allergies  Medications Reviewed Today     Reviewed by Lorenda Peck, DPM (Physician) on 01/13/22 at [place] List Status: <None>   Medication Order Taking? Sig Documenting Provider Last Dose Status Informant  Accu-Chek Softclix Lancets lancets [id number]   [provider]  Active   aspirin 81 MG tablet [id number]  Take 1 tablet (81 mg total) by mouth daily. Emeterio Reeve, DO  Active   blood glucose meter kit and supplies [id number]  Dispense based on patient and insurance preference. Use up to four times daily as directed. (FOR ICD-10 E10.9, E11.9). Emeterio Reeve, DO  Active   cilostazol (PLETAL) 100 MG tablet [id number]  TAKE 1 TABLET TWICE DAILY Samuel Bouche, NP  Active   FEROSUL 325 (65 Fe) MG tablet [id number]  TAKE 1 TABLET TWICE DAILY WITH MEALS Emeterio Reeve, DO  Active   furosemide (LASIX) 20 MG tablet [id number]  TAKE 1 TABLET EVERY DAY Jessup, Joy, NP  Active   glucose blood (TRUE METRIX BLOOD GLUCOSE TEST) test strip [id number]  TEST UP TO FOUR TIMES DAILY AS DIRECTED Charna Archer, Joy, NP  Active   insulin glargine, 2 Unit Dial, (TOUJEO MAX SOLOSTAR) 300 UNIT/ML Solostar Pen [id number]  Inject 16 Units into the skin at bedtime. Increase by 4 units every 4 days until fasting (morning) blood sugar is <150, then stay at that dose.  Patient taking differently: Inject 35 Units into the skin at bedtime. Increase by 4 units every 4 days until fasting (morning) blood sugar is <150, then stay at that dose. (Taking 35 units daily as of 06/23/21)   Emeterio Reeve, DO  Active            Med Note Dorene Ar Mar 24, 2021 10:11 AM) Taking 42 units as of 03/24/21   Insulin Pen Needle 29G X 12.7MM [place] [id number]  100 each by Does not apply route daily. Please dispense qty, brand, appropriate pen sizing for toujeo solostar Charna Archer, Caryl Asp, NP  Active   MODERNA COVID-19 BIVAL BOOSTER 50 MCG/0.5ML injection [id number]   [provider]  Active   Oxycodone HCl 10 MG TABS  [id number]  Take 0.5-1 tablets (5-10 mg total) by mouth every 6 (six) hours as needed. 30 pills must last 90 days Samuel Bouche, NP  Active   Oxycodone HCl 10 MG TABS [id number]  Take 0.5-1 tablets (5-10 mg total) by mouth every 6 (six) hours as needed. 30 pills must last 90 days Samuel Bouche, NP  Active   Oxycodone HCl 10 MG TABS [id number]  Take 0.5-1 tablets (5-10 mg total) by mouth every 6 (six) hours as needed. 30 pills must last 90 days  Samuel Bouche, NP  Active   pantoprazole (PROTONIX) 40 MG tablet [id number]  TAKE 1 TABLET EVERY DAY Samuel Bouche, NP  Active   potassium chloride (KLOR-CON M) 10 MEQ tablet [id number]  TAKE 1 TABLET EVERY DAY Jessup, Joy, NP  Active   rosuvastatin (CRESTOR) 40 MG tablet [id number]  TAKE 1 TABLET (40 MG TOTAL) BY MOUTH DAILY. OFFICE VISIT REQUIRED PRIOR TO ANY FURTHER REFILLS Samuel Bouche, NP  Active   valsartan (DIOVAN) 40 MG tablet [id number]  Take 1 tablet (40 mg total) by mouth daily. Samuel Bouche, NP  Active             Patient Active Problem List   Diagnosis Date Noted   Pilonidal cyst 11/25/2019   AKI (acute kidney injury) ([place]) 11/03/2019   Hyponatremia 11/03/2019   Sepsis ([place]) 11/03/2019   Weakness 11/03/2019   Osteomyelitis of finger of left hand ([place])    Vitamin D deficiency 12/28/2018   Erectile dysfunction 12/28/2018   PVC (premature ventricular contraction) 12/27/2018   Essential hypertension 10/11/2018   Coronary artery disease 10/11/2018   Peripheral vascular disease ([place]) 10/11/2018   Mixed hyperlipidemia 10/11/2018   Osteoarthritis 10/11/2018   CKD (chronic kidney disease) stage 3, GFR 30-59 ml/min (HCC) 10/11/2018   History of anemia 10/11/2018   Other chronic pain 06/08/2018   Vaccine refused by patient 06/07/2018   Abnormal stress test 04/10/2018   Non-traumatic rhabdomyolysis 02/01/2018   Hypokalemia 02/01/2018   Hypophosphatemia 02/01/2018   Perianal abscess 10/16/2017   Nicotine dependence with current use 10/12/2017    Perirectal abscess 10/12/2017   Drug-induced constipation 10/12/2017   GI bleed 09/15/2017   Elevated troponin 06/15/2017   Chronic diastolic heart failure ([place]) 06/15/2017   Normocytic anemia 06/15/2017   Type 2 diabetes mellitus with diabetic neuropathy, with long-term current use of insulin ([place]) 04/24/2017   History of cerebrovascular accident 09/20/2016   Bone lesion 09/29/2014   Finger pain, right 09/29/2014   S/P coronary artery stent placement 12/20/2012   Old myocardial infarction 10/27/2010    Immunization History  Administered Date(s) Administered   Janssen (J&J) SARS-COV-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 12/04/2019   PFIZER(Purple Top)SARS-COV-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 06/10/2020    Conditions to be addressed/monitored: HTN, HLD, and DMII  Care Plan : Medication Management  Updates made by Darius Bump, [place] since 02/02/2022 12:00 AM     Problem: Diabetes, HTN, HLD   Priority: High     Long-Range Goal: Disease Progression Prevention   Start Date: 12/23/2020  Recent Progress: On track  Priority: High  Note:   Current Barriers:  Unable to independently afford treatment regimen Unable to achieve control of diabetes   Pharmacist Clinical Goal(s):  Over the next 30 days, patient will verbalize ability to afford treatment regimen achieve adherence to monitoring guidelines and medication adherence to achieve therapeutic efficacy adhere to prescribed medication regimen as evidenced by fill history & verbal confirmation of patient assistance status  through collaboration with PharmD and provider.   Interventions: 1:1 collaboration with Samuel Bouche, NP regarding development and update of comprehensive plan of care as evidenced by provider attestation and co-signature Inter-disciplinary care team collaboration (see longitudinal plan of care) Comprehensive medication review performed; medication list updated in electronic medical record  Diabetes:  Uncontrolled: toujeo 35 units daily  Previous  glucose readings:  90-100s  Denies hypoglycemic/hyperglycemic symptoms  Previously discussed current meal patterns: breakfast: eggs, hashbrown, sausage or bacon, corn beef hash; lunch: skips lunch; dinner: varies - taco, burrito, spaghetti, cheeseburgers, pork sausage; drinks: Mt  Dew (2 per day)  Current exercise: limited by residual stroke deficits but does shoulder rotations  Recommended goal of continuing green vegetable on plate 3x per week, NOW ADDING the goal of 2 MtDews per day, maximum, aiming for 1 per day. ,   Recommended continue toujeo at 35 units daily. Patient assistance for toujeo ([employer] PAP) approved.     Continue current regimen, begin GLP1 for ASCVD benefit with goal of reducing insulin, minimizing hypoglycemia, and further achieving BG control. Filled out paperwork for novonordisk patient assistance for ozempic.  Hypertension:  Controlled; current treatment: furosemide 50m daily, valsartan 434mdaily  Current home readings: not checking at home  Denies hypotensive/hypertensive symptoms  Recommended continue current regimen, patient will bring in his BP monitor next visit to learn how to use it.    Hyperlipidemia:  Controlled, current treatment: Rosuvastatin 4028m  Medications previously tried: N/A  Educated on multiple benefits of statin therapy Recommended continue current regimen  Patient Goals/Self-Care Activities Over the next 7 days, patient will:   take medications as prescribed, check glucose 2x day (morning and afternoon), document, and provide at future appointments, collaborate with provider on medication access solutions, and engage in dietary modifications by choosing a green vegetable at least 3x per week and aim to decrease to 1-2 Mt Dew per day at most  Follow Up Plan: Face to face follow up appointment with care management team member scheduled for:  1 week          Medication Assistance: Patient assistance for toujeo ([employer] PAP) approved  12/28/20, medication delivered to primary care office 01/06/21, 90DS will continue to be delivered. Recertification of application for [id number] can be completed July 22, 2021.  [id number] [place] [place]: [phone]: [phone] [place]one: [phone]: [phone] pill box? Yes Pt endorses 100% compliance  Follow Up:  Patient agrees to Care Plan and Follow-up.  Plan: Face to Face appointment with care management team member scheduled for: 1 week  KeeLarinda ButteryharmD Clinical Pharmacist [phone]

## 2022-02-02 NOTE — Patient Instructions (Signed)
Visit Information  Thank you for taking time to visit with me today. Please don't hesitate to contact me if I can be of assistance to you before our next scheduled telephone appointment.  Following are the goals we discussed today:  Patient Goals/Self-Care Activities Over the next 7 days, patient will:   take medications as prescribed, check glucose 2x day (morning and afternoon), document, and provide at future appointments, collaborate with provider on medication access solutions, and engage in dietary modifications by choosing a green vegetable at least 3x per week and aim to decrease to 1-2 Mt Dew per day at most  Follow Up Plan: Face to face follow up appointment with care management team member scheduled for:  1 week  Please call the care guide team at [phone] if you need to cancel or reschedule your appointment.    Patient verbalizes understanding of instructions and care plan provided today and agrees to view in [place]. Active MyChart status and patient understanding of how to access instructions and care plan via MyChart confirmed with patient.     Joseph Grimes

## 2022-02-09 ENCOUNTER — Ambulatory Visit: Payer: Medicare HMO

## 2022-02-11 ENCOUNTER — Telehealth: Payer: Self-pay

## 2022-02-11 NOTE — Telephone Encounter (Signed)
LMVM to advise the patient that his Nelva Nay is at the office for him to pick up at his earliest convenience.

## 2022-02-23 ENCOUNTER — Ambulatory Visit (INDEPENDENT_AMBULATORY_CARE_PROVIDER_SITE_OTHER): Payer: Medicare HMO | Admitting: Pharmacist

## 2022-02-23 ENCOUNTER — Other Ambulatory Visit: Payer: Self-pay

## 2022-02-23 DIAGNOSIS — I10 Essential (primary) hypertension: Secondary | ICD-10-CM

## 2022-02-23 DIAGNOSIS — E1140 Type 2 diabetes mellitus with diabetic neuropathy, unspecified: Secondary | ICD-10-CM

## 2022-02-23 DIAGNOSIS — E782 Mixed hyperlipidemia: Secondary | ICD-10-CM

## 2022-02-23 MED ORDER — OXYCODONE HCL 10 MG PO TABS: 5.0000 mg | ORAL_TABLET | Freq: Four times a day (QID) | ORAL | 0 refills | Status: DC | PRN

## 2022-02-23 NOTE — Progress Notes (Signed)
Chronic Care Management Pharmacy Note  02/23/2022 Name:  Joseph Grimes MRN:  [id number] DOB:  Jan 19, 1961  Summary: addressed HTN, HLD, and primarily DM.  Patient taking toujeo max 36 units daily (via sample due to brief period 01/28/22 to 01/30/22 where he ran out of medication supply from [facility] patient assistance).  Encouraged patient to consider GLP1 for ASCVD benefit with goal of reducing insulin, minimizing hypoglycemia, and further achieving BG control. He since has gotten his regular Toujeo supply, so this is back on track.  BG per patient: Low 140s in afternoon  80-90s in mornings. He states he is asymptomatic from hypoglycemia, nevertheless this is concerning.  He also brought in paperwork about oxycodone RX and having issues filling at pharmacy, provided care coordination with provider CMA.  Recommendations/Changes made from today's visit:  -Continue current regimen for now, we are waiting on patient to return income documentation to fax novonordisk application - Ozempic 7.82NF weekly to begin this Friday 02/25/22 via sample - Toujeo switch to tresiba so that we can obtain insulin & GLP1 through same company   Plan: f/u with pharmacist in 1 month, he will bring his income statement and his home BP monitor for [place] to work through how to use it.  Subjective: Joseph Grimes is an 61 y.o. year old male who is a primary patient of Samuel Bouche, NP.  The CCM team was consulted for assistance with disease management and care coordination needs.    Engaged with patient by telephone for follow up visit in response to provider referral for pharmacy case management and/or care coordination services.   Consent to Services:  The patient was given information about Chronic Care Management services, agreed to services, and gave verbal consent prior to initiation of services.  Please see initial visit note for detailed documentation.   Patient Care Team: Samuel Bouche, NP as PCP - General  (Nurse Practitioner) Darius Bump, [facility] as Pharmacist (Pharmacist)  Objective:  Lab Results  Component Value Date   CREATININE 2.24 (H) 12/02/2021   CREATININE 1.94 (H) 12/23/2020   CREATININE 1.82 (H) 11/26/2019    Lab Results  Component Value Date   HGBA1C 8.6 (A) 12/02/2021   Last diabetic Eye exam:  Lab Results  Component Value Date/Time   HMDIABEYEEXA Retinopathy (A) 06/16/2021 12:00 AM       Component Value Date/Time   CHOL 101 12/02/2021 0000   TRIG 75 12/02/2021 0000   HDL 48 12/02/2021 0000   CHOLHDL 2.1 12/02/2021 0000   LDLCALC 37 12/02/2021 0000       Latest Ref Rng & Units 12/02/2021   12:00 AM 12/23/2020   12:18 PM 11/26/2019   11:14 AM  Hepatic Function  Total Protein 6.1 - 8.1 g/dL 6.1  6.1  5.9   AST 10 - 35 U/L 12  10  9    ALT 9 - 46 U/L 10  14  9    Total Bilirubin 0.2 - 1.2 mg/dL 0.4  0.3  0.4     Lab Results  Component Value Date/Time   TSH 1.25 11/26/2019 11:14 AM       Latest Ref Rng & Units 12/02/2021   12:00 AM 11/26/2019   11:14 AM 12/27/2018   11:56 AM  CBC  WBC 3.8 - 10.8 Thousand/uL 11.8  11.6  10.7   Hemoglobin 13.2 - 17.1 g/dL 13.6  8.5  14.4   Hematocrit 38.5 - 50.0 % 41.0  26.7  44.1   Platelets 140 -  400 Thousand/uL 229  397  331     Lab Results  Component Value Date/Time   VD25OH 8 (L) 12/27/2018 11:56 AM     Social History   Tobacco Use  Smoking Status Every Day   Packs/day: 0.50   Types: Cigarettes  Smokeless Tobacco Never  Tobacco Comments   refused   BP Readings from Last 3 Encounters:  01/12/22 (!) 113/54  12/02/21 (!) 166/75  10/06/21 (!) 142/65   Pulse Readings from Last 3 Encounters:  01/12/22 69  12/02/21 84  10/06/21 83   Wt Readings from Last 3 Encounters:  12/02/21 153 lb 0.6 oz (69.4 kg)  10/06/21 153 lb 1.9 oz (69.5 kg)  09/03/21 149 lb (67.6 kg)    Assessment: Review of patient past medical history, allergies, medications, health status, including review of consultants reports,  laboratory and other test data, was performed as part of comprehensive evaluation and provision of chronic care management services.   SDOH:  (Social Determinants of Health) assessments and interventions performed:    CCM Care Plan  No Known Allergies  Medications Reviewed Today     Reviewed by Lorenda Peck, DPM (Physician) on 01/13/22 at [place] List Status: <None>   Medication Order Taking? Sig Documenting Provider Last Dose Status Informant  Accu-Chek Softclix Lancets lancets [id number]   [provider]  Active   aspirin 81 MG tablet [id number]  Take 1 tablet (81 mg total) by mouth daily. Emeterio Reeve, DO  Active   blood glucose meter kit and supplies [id number]  Dispense based on patient and insurance preference. Use up to four times daily as directed. (FOR ICD-10 E10.9, E11.9). Emeterio Reeve, DO  Active   cilostazol (PLETAL) 100 MG tablet [id number]  TAKE 1 TABLET TWICE DAILY Samuel Bouche, NP  Active   FEROSUL 325 (65 Fe) MG tablet [id number]  TAKE 1 TABLET TWICE DAILY WITH MEALS Emeterio Reeve, DO  Active   furosemide (LASIX) 20 MG tablet [id number]  TAKE 1 TABLET EVERY DAY Jessup, Joy, NP  Active   glucose blood (TRUE METRIX BLOOD GLUCOSE TEST) test strip [id number]  TEST UP TO FOUR TIMES DAILY AS DIRECTED Charna Archer, Joy, NP  Active   insulin glargine, 2 Unit Dial, (TOUJEO MAX SOLOSTAR) 300 UNIT/ML Solostar Pen [id number]  Inject 16 Units into the skin at bedtime. Increase by 4 units every 4 days until fasting (morning) blood sugar is <150, then stay at that dose.  Patient taking differently: Inject 35 Units into the skin at bedtime. Increase by 4 units every 4 days until fasting (morning) blood sugar is <150, then stay at that dose. (Taking 35 units daily as of 06/23/21)   Emeterio Reeve, DO  Active            Med Note Dorene Ar Mar 24, 2021 10:11 AM) Taking 42 units as of 03/24/21   Insulin Pen Needle 29G X 12.7MM [place] [id number]  100 each by Does  not apply route daily. Please dispense qty, brand, appropriate pen sizing for toujeo solostar Charna Archer, Caryl Asp, NP  Active   MODERNA COVID-19 BIVAL BOOSTER 50 MCG/0.5ML injection [id number]   [provider]  Active   Oxycodone HCl 10 MG TABS [id number]  Take 0.5-1 tablets (5-10 mg total) by mouth every 6 (six) hours as needed. 30 pills must last 90 days Samuel Bouche, NP  Active   Oxycodone HCl 10 MG TABS [id number]  Take 0.5-1 tablets (5-10 mg total) by mouth every 6 (six)  hours as needed. 30 pills must last 90 days Samuel Bouche, NP  Active   Oxycodone HCl 10 MG TABS [id number]  Take 0.5-1 tablets (5-10 mg total) by mouth every 6 (six) hours as needed. 30 pills must last 90 days Samuel Bouche, NP  Active   pantoprazole (PROTONIX) 40 MG tablet [id number]  TAKE 1 TABLET EVERY DAY Samuel Bouche, NP  Active   potassium chloride (KLOR-CON M) 10 MEQ tablet [id number]  TAKE 1 TABLET EVERY DAY Jessup, Joy, NP  Active   rosuvastatin (CRESTOR) 40 MG tablet [id number]  TAKE 1 TABLET (40 MG TOTAL) BY MOUTH DAILY. OFFICE VISIT REQUIRED PRIOR TO ANY FURTHER REFILLS Samuel Bouche, NP  Active   valsartan (DIOVAN) 40 MG tablet [id number]  Take 1 tablet (40 mg total) by mouth daily. Samuel Bouche, NP  Active             Patient Active Problem List   Diagnosis Date Noted   Pilonidal cyst 11/25/2019   AKI (acute kidney injury) ([place]) 11/03/2019   Hyponatremia 11/03/2019   Sepsis ([place]) 11/03/2019   Weakness 11/03/2019   Osteomyelitis of finger of left hand ([place])    Vitamin D deficiency 12/28/2018   Erectile dysfunction 12/28/2018   PVC (premature ventricular contraction) 12/27/2018   Essential hypertension 10/11/2018   Coronary artery disease 10/11/2018   Peripheral vascular disease ([place]) 10/11/2018   Mixed hyperlipidemia 10/11/2018   Osteoarthritis 10/11/2018   CKD (chronic kidney disease) stage 3, GFR 30-59 ml/min (HCC) 10/11/2018   History of anemia 10/11/2018   Other chronic pain 06/08/2018   Vaccine refused by  patient 06/07/2018   Abnormal stress test 04/10/2018   Non-traumatic rhabdomyolysis 02/01/2018   Hypokalemia 02/01/2018   Hypophosphatemia 02/01/2018   Perianal abscess 10/16/2017   Nicotine dependence with current use 10/12/2017   Perirectal abscess 10/12/2017   Drug-induced constipation 10/12/2017   GI bleed 09/15/2017   Elevated troponin 06/15/2017   Chronic diastolic heart failure ([place]) 06/15/2017   Normocytic anemia 06/15/2017   Type 2 diabetes mellitus with diabetic neuropathy, with long-term current use of insulin ([place]) 04/24/2017   History of cerebrovascular accident 09/20/2016   Bone lesion 09/29/2014   Finger pain, right 09/29/2014   S/P coronary artery stent placement 12/20/2012   Old myocardial infarction 10/27/2010    Immunization History  Administered Date(s) Administered   Janssen (J&J) SARS-COV-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 12/04/2019   PFIZER(Purple Top)SARS-COV-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 06/10/2020    Conditions to be addressed/monitored: HTN, HLD, and DMII  Care Plan : Medication Management  Updates made by Darius Bump, [place] since 02/23/2022 12:00 AM     Problem: Diabetes, HTN, HLD   Priority: High     Long-Range Goal: Disease Progression Prevention   Start Date: 12/23/2020  Recent Progress: On track  Priority: High  Note:   Current Barriers:  Unable to independently afford treatment regimen Unable to achieve control of diabetes   Pharmacist Clinical Goal(s):  Over the next 30 days, patient will verbalize ability to afford treatment regimen achieve adherence to monitoring guidelines and medication adherence to achieve therapeutic efficacy adhere to prescribed medication regimen as evidenced by fill history & verbal confirmation of patient assistance status  through collaboration with PharmD and provider.   Interventions: 1:1 collaboration with Samuel Bouche, NP regarding development and update of comprehensive plan of care as evidenced by provider attestation and  co-signature Inter-disciplinary care team collaboration (see longitudinal plan of care) Comprehensive medication review performed; medication list updated in electronic medical record  Diabetes:  Uncontrolled:  toujeo 35 units daily  Previous glucose readings:  90-100s  Denies hypoglycemic/hyperglycemic symptoms  Previously discussed current meal patterns: breakfast: eggs, hashbrown, sausage or bacon, corn beef hash; lunch: skips lunch; dinner: varies - taco, burrito, spaghetti, cheeseburgers, pork sausage; drinks: Mt Dew (2 per day)  Current exercise: limited by residual stroke deficits but does shoulder rotations  Recommended goal of continuing green vegetable on plate 3x per week, NOW ADDING the goal of 2 MtDews per day, maximum, aiming for 1 per day. ,      Continue current regimen, begin GLP1 for ASCVD benefit with goal of reducing insulin, minimizing hypoglycemia, and further achieving BG control. Filling out paperwork for novonordisk patient assistance for ozempic.  Hypertension:  Controlled; current treatment: furosemide 14m daily, valsartan 438mdaily  Current home readings: not checking at home  Denies hypotensive/hypertensive symptoms  Recommended continue current regimen, patient will bring in his BP monitor next visit to learn how to use it.    Hyperlipidemia:  Controlled, current treatment: Rosuvastatin 4080m  Medications previously tried: N/A  Educated on multiple benefits of statin therapy Recommended continue current regimen  Patient Goals/Self-Care Activities Over the next 30 days, patient will:   take medications as prescribed, check glucose 2x day (morning and afternoon), document, and provide at future appointments, collaborate with provider on medication access solutions, and engage in dietary modifications by choosing a green vegetable at least 3x per week and aim to decrease to 1-2 Mt Dew per day at most  Follow Up Plan: Telephone follow up appointment with care  management team member scheduled for:  1 month         Medication Assistance: Patient assistance for toujeo ([employer] PAP) approved 12/28/20, medication delivered to primary care office 01/06/21, 90DS will continue to be delivered. Recertification of application for [id number] can be completed July 22, 2021.  [id number] [place]: [phone]: [phone] GrassH [place]one: [phone]: [phone] pill box? Yes Pt endorses 100% compliance  Follow Up:  Patient agrees to Care Plan and Follow-up.  Plan: Telephone follow up appointment with care management team member scheduled for:  1 month  KeeLarinda ButteryharmD Clinical Pharmacist [phone]

## 2022-02-23 NOTE — Patient Instructions (Signed)
Visit Information  Thank you for taking time to visit with me today. Please don't hesitate to contact me if I can be of assistance to you before our next scheduled telephone appointment.  Following are the goals we discussed today:   Patient Goals/Self-Care Activities Over the next 30 days, patient will:   take medications as prescribed, check glucose 2x day (morning and afternoon), document, and provide at future appointments, collaborate with provider on medication access solutions, and engage in dietary modifications by choosing a green vegetable at least 3x per week and aim to decrease to 1-2 Mt Dew per day at most  Follow Up Plan: Telephone follow up appointment with care management team member scheduled for:  1 month  Please call the care guide team at [phone] if you need to cancel or reschedule your appointment.    Patient verbalizes understanding of instructions and care plan provided today and agrees to view in [place]. Active MyChart status and patient understanding of how to access instructions and care plan via MyChart confirmed with patient.     Darius Bump

## 2022-02-23 NOTE — Telephone Encounter (Signed)
I spoke with [facility] and they stated that they need a new Rx sent because it looks like on their end that the Rx was discontinues because the patient called in on 07/12 requesting his refill.

## 2022-03-01 ENCOUNTER — Other Ambulatory Visit: Payer: Self-pay | Admitting: Medical-Surgical

## 2022-03-02 DIAGNOSIS — D72829 Elevated white blood cell count, unspecified: Secondary | ICD-10-CM | POA: Insufficient documentation

## 2022-03-02 DIAGNOSIS — F1190 Opioid use, unspecified, uncomplicated: Secondary | ICD-10-CM | POA: Insufficient documentation

## 2022-03-02 DIAGNOSIS — N289 Disorder of kidney and ureter, unspecified: Secondary | ICD-10-CM | POA: Insufficient documentation

## 2022-03-02 NOTE — Progress Notes (Unsigned)
   Established Patient Office Visit  Subjective   Patient ID: Joseph Grimes, male   DOB: 1960-09-26 Age: 61 y.o. MRN: [id number]   No chief complaint on file.   HPI Pleasant 61 year old male presenting today for the following:  DM:  HTN:  HLD:  Chronic pain:   Objective:    There were no vitals filed for this visit.  Physical Exam  No results found for this or any previous visit (from the past 24 hour(s)).   {Labs (Optional):23779}  The ASCVD Risk score (Arnett DK, et al., 2019) failed to calculate for the following reasons:   The patient has a prior MI or stroke diagnosis   Assessment & Plan:   No problem-specific Assessment & Plan notes found for this encounter.   No follow-ups on file.  ___________________________________________ Clearnce Sorrel, DNP, APRN, FNP-BC [facility] [place]

## 2022-03-03 ENCOUNTER — Encounter: Payer: Self-pay | Admitting: Medical-Surgical

## 2022-03-03 ENCOUNTER — Ambulatory Visit (INDEPENDENT_AMBULATORY_CARE_PROVIDER_SITE_OTHER): Payer: Medicare HMO | Admitting: Medical-Surgical

## 2022-03-03 VITALS — BP 121/73 | HR 93 | Resp 20 | Ht 69.0 in | Wt 155.0 lb

## 2022-03-03 DIAGNOSIS — Z794 Long term (current) use of insulin: Secondary | ICD-10-CM

## 2022-03-03 DIAGNOSIS — E782 Mixed hyperlipidemia: Secondary | ICD-10-CM

## 2022-03-03 DIAGNOSIS — N289 Disorder of kidney and ureter, unspecified: Secondary | ICD-10-CM

## 2022-03-03 DIAGNOSIS — E1140 Type 2 diabetes mellitus with diabetic neuropathy, unspecified: Secondary | ICD-10-CM | POA: Diagnosis not present

## 2022-03-03 DIAGNOSIS — I10 Essential (primary) hypertension: Secondary | ICD-10-CM

## 2022-03-03 DIAGNOSIS — D72829 Elevated white blood cell count, unspecified: Secondary | ICD-10-CM | POA: Diagnosis not present

## 2022-03-03 DIAGNOSIS — F1190 Opioid use, unspecified, uncomplicated: Secondary | ICD-10-CM | POA: Diagnosis not present

## 2022-03-03 DIAGNOSIS — M1990 Unspecified osteoarthritis, unspecified site: Secondary | ICD-10-CM

## 2022-03-03 LAB — POCT GLYCOSYLATED HEMOGLOBIN (HGB A1C): HbA1c, POC (controlled diabetic range): 8.1 % — AB (ref 0.0–7.0)

## 2022-03-03 LAB — POCT UA - MICROALBUMIN
Creatinine, POC: 300 mg/dL
Microalbumin Ur, POC: 150 mg/L

## 2022-03-03 MED ORDER — OXYCODONE HCL 10 MG PO TABS: 5.0000 mg | ORAL_TABLET | Freq: Four times a day (QID) | ORAL | 0 refills | Status: AC | PRN

## 2022-03-03 MED ORDER — OXYCODONE HCL 10 MG PO TABS: 5.0000 mg | ORAL_TABLET | Freq: Four times a day (QID) | ORAL | 0 refills | Status: DC | PRN

## 2022-03-04 ENCOUNTER — Encounter: Payer: Self-pay | Admitting: Medical-Surgical

## 2022-03-04 LAB — CBC WITH DIFFERENTIAL/PLATELET
Absolute Monocytes: 990 cells/uL — ABNORMAL HIGH (ref 200–950)
Basophils Absolute: 39 cells/uL (ref 0–200)
Basophils Relative: 0.4 %
Eosinophils Absolute: 431 cells/uL (ref 15–500)
Eosinophils Relative: 4.4 %
HCT: 40.9 % (ref 38.5–50.0)
Hemoglobin: 13.9 g/dL (ref 13.2–17.1)
Lymphs Abs: 1411 cells/uL (ref 850–3900)
MCH: 32.2 pg (ref 27.0–33.0)
MCHC: 34 g/dL (ref 32.0–36.0)
MCV: 94.7 fL (ref 80.0–100.0)
MPV: 10.3 fL (ref 7.5–12.5)
Monocytes Relative: 10.1 %
Neutro Abs: 6929 cells/uL (ref 1500–7800)
Neutrophils Relative %: 70.7 %
Platelets: 214 10*3/uL (ref 140–400)
RBC: 4.32 10*6/uL (ref 4.20–5.80)
RDW: 12.5 % (ref 11.0–15.0)
Total Lymphocyte: 14.4 %
WBC: 9.8 10*3/uL (ref 3.8–10.8)

## 2022-03-04 LAB — DRUG MONITORING, PANEL 7 WITH CONFIRMATION, URINE
6 Acetylmorphine: NEGATIVE ng/mL (ref <10)
Alcohol Metabolites: NEGATIVE ng/mL (ref <500)
Amphetamines: NEGATIVE ng/mL (ref <500)
Barbiturates: NEGATIVE ng/mL (ref <300)
Benzodiazepines: NEGATIVE ng/mL (ref <100)
Cocaine Metabolite: NEGATIVE ng/mL (ref <150)
Creatinine: 113.2 mg/dL (ref >=20.0)
Marijuana Metabolite: NEGATIVE ng/mL (ref <20)
Methadone Metabolite: NEGATIVE ng/mL (ref <100)
Opiates: NEGATIVE ng/mL (ref <100)
Oxidant: NEGATIVE ug/mL (ref <200)
Oxycodone: NEGATIVE ng/mL (ref <100)
pH: 5.5 (ref 4.5–9.0)

## 2022-03-04 LAB — BASIC METABOLIC PANEL WITH GFR
BUN/Creatinine Ratio: 7 (calc) (ref 6–22)
BUN: 21 mg/dL (ref 7–25)
CO2: 23 mmol/L (ref 20–32)
Calcium: 9.4 mg/dL (ref 8.6–10.3)
Chloride: 108 mmol/L (ref 98–110)
Creat: 2.97 mg/dL — ABNORMAL HIGH (ref 0.70–1.35)
Glucose, Bld: 140 mg/dL — ABNORMAL HIGH (ref 65–99)
Potassium: 4.8 mmol/L (ref 3.5–5.3)
Sodium: 138 mmol/L (ref 135–146)
eGFR: 23 mL/min/{1.73_m2} — ABNORMAL LOW (ref >=60)

## 2022-03-04 LAB — DM TEMPLATE

## 2022-03-07 DIAGNOSIS — E782 Mixed hyperlipidemia: Secondary | ICD-10-CM

## 2022-03-07 DIAGNOSIS — E1140 Type 2 diabetes mellitus with diabetic neuropathy, unspecified: Secondary | ICD-10-CM

## 2022-03-07 DIAGNOSIS — I10 Essential (primary) hypertension: Secondary | ICD-10-CM | POA: Diagnosis not present

## 2022-03-07 DIAGNOSIS — Z794 Long term (current) use of insulin: Secondary | ICD-10-CM | POA: Diagnosis not present

## 2022-03-08 ENCOUNTER — Ambulatory Visit (INDEPENDENT_AMBULATORY_CARE_PROVIDER_SITE_OTHER): Payer: Medicare HMO | Admitting: Pharmacist

## 2022-03-08 DIAGNOSIS — Z794 Long term (current) use of insulin: Secondary | ICD-10-CM

## 2022-03-08 DIAGNOSIS — I10 Essential (primary) hypertension: Secondary | ICD-10-CM

## 2022-03-08 DIAGNOSIS — E782 Mixed hyperlipidemia: Secondary | ICD-10-CM

## 2022-03-08 NOTE — Patient Instructions (Signed)
Visit Information  Thank you for taking time to visit with me today. Please don't hesitate to contact me if I can be of assistance to you before our next scheduled telephone appointment.  Following are the goals we discussed today:   Patient Goals/Self-Care Activities Over the next 30 days, patient will:   take medications as prescribed, check glucose 2x day (morning and afternoon), document, and provide at future appointments, collaborate with provider on medication access solutions, and engage in dietary modifications by choosing a green vegetable at least 3x per week and aim to decrease to 1-2 Mt Dew per day at most  Follow Up Plan: Telephone follow up appointment with care management team member scheduled for:  1 month  Please call the care guide team at [phone] if you need to cancel or reschedule your appointment.    Patient verbalizes understanding of instructions and care plan provided today and agrees to view in [place]. Active MyChart status and patient understanding of how to access instructions and care plan via MyChart confirmed with patient.     Joseph Grimes

## 2022-03-08 NOTE — Progress Notes (Signed)
 Chronic Care Management Pharmacy Note  03/08/2022 Name:  Joseph Grimes MRN:  [id number] DOB:  08/10/1960  Summary: addressed HTN, HLD, and primarily DM.  Started ozempic 0.25mg weekly on Fridays. For about 3-4 nights, he was sick around midnight. When belching, experiences altered taste, he describes as deviled eggs. He is otherwise tolerating the medication.  BG 76 this AM. Provided glucose log readings as follows:  Morning Afternoon  95 98  130 98  69 130  114 110  74 88  154 136  94 130  74 86  87 118  54 92  110 87    Still taking toujeo 32-34 units when eating lighter meals, and 36 units when he expects a heavier meal intake.    Recommendations/Changes made from today's visit:  -Continue current regimen for now, ozempic 0.25mg weekly via sample, awaiting result from novonordisk application for ozempic + tresiba  - Toujeo switch to tresiba so that we can obtain insulin & GLP1 through same company - Will increase to ozempic 0.5mg weekly when patient assistance medication supply is confirmed.   Plan: f/u with pharmacist in 1 month  Subjective: Joseph Grimes is an 60 y.o. year old male who is a primary patient of Jessup, Joy, NP.  The CCM team was consulted for assistance with disease management and care coordination needs.    Engaged with patient by telephone for follow up visit in response to provider referral for pharmacy case management and/or care coordination services.   Consent to Services:  The patient was given information about Chronic Care Management services, agreed to services, and gave verbal consent prior to initiation of services.  Please see initial visit note for detailed documentation.   Patient Care Team: Jessup, Joy, NP as PCP - General (Nurse Practitioner) Kline, Keesha J, RPH as Pharmacist (Pharmacist)  Objective:  Lab Results  Component Value Date   CREATININE 2.97 (H) 03/03/2022   CREATININE 2.24 (H) 12/02/2021   CREATININE 1.94  (H) 12/23/2020    Lab Results  Component Value Date   HGBA1C 8.1 (A) 03/03/2022   Last diabetic Eye exam:  Lab Results  Component Value Date/Time   HMDIABEYEEXA Retinopathy (A) 06/16/2021 12:00 AM       Component Value Date/Time   CHOL 101 12/02/2021 0000   TRIG 75 12/02/2021 0000   HDL 48 12/02/2021 0000   CHOLHDL 2.1 12/02/2021 0000   LDLCALC 37 12/02/2021 0000       Latest Ref Rng & Units 12/02/2021   12:00 AM 12/23/2020   12:18 PM 11/26/2019   11:14 AM  Hepatic Function  Total Protein 6.1 - 8.1 g/dL 6.1  6.1  5.9   AST 10 - 35 U/L 12  10  9   ALT 9 - 46 U/L 10  14  9   Total Bilirubin 0.2 - 1.2 mg/dL 0.4  0.3  0.4     Lab Results  Component Value Date/Time   TSH 1.25 11/26/2019 11:14 AM       Latest Ref Rng & Units 03/03/2022   12:00 AM 12/02/2021   12:00 AM 11/26/2019   11:14 AM  CBC  WBC 3.8 - 10.8 Thousand/uL 9.8  11.8  11.6   Hemoglobin 13.2 - 17.1 g/dL 13.9  13.6  8.5   Hematocrit 38.5 - 50.0 % 40.9  41.0  26.7   Platelets 140 - 400 Thousand/uL 214  229  397     Lab Results  Component Value Date/Time     VD25OH 8 (L) 12/27/2018 11:56 AM     Social History   Tobacco Use  Smoking Status Every Day   Packs/day: 0.50   Types: Cigarettes  Smokeless Tobacco Never  Tobacco Comments   refused   BP Readings from Last 3 Encounters:  03/03/22 121/73  01/12/22 (!) 113/54  12/02/21 (!) 166/75   Pulse Readings from Last 3 Encounters:  03/03/22 93  01/12/22 69  12/02/21 84   Wt Readings from Last 3 Encounters:  03/03/22 155 lb 0.6 oz (70.3 kg)  12/02/21 153 lb 0.6 oz (69.4 kg)  10/06/21 153 lb 1.9 oz (69.5 kg)    Assessment: Review of patient past medical history, allergies, medications, health status, including review of consultants reports, laboratory and other test data, was performed as part of comprehensive evaluation and provision of chronic care management services.   SDOH:  (Social Determinants of Health) assessments and interventions  performed:    CCM Care Plan  No Known Allergies  Medications Reviewed Today     Reviewed by Beverlee Nims, CMA (Certified Medical Assistant) on 03/03/22 at 1057  Med List Status: <None>   Medication Order Taking? Sig Documenting Provider Last Dose Status Informant  Accu-Chek Softclix Lancets lancets [id number] No  [provider] Taking Active   aspirin 81 MG tablet [id number] No Take 1 tablet (81 mg total) by mouth daily. Emeterio Reeve, DO Taking Active   blood glucose meter kit and supplies [id number] No Dispense based on patient and insurance preference. Use up to four times daily as directed. (FOR ICD-10 E10.9, E11.9). Emeterio Reeve, DO Taking Active   cilostazol (PLETAL) 100 MG tablet [id number] No TAKE 1 TABLET TWICE DAILY Samuel Bouche, NP Taking Active   FEROSUL 325 (65 Fe) MG tablet [id number] No TAKE 1 TABLET TWICE DAILY WITH MEALS Emeterio Reeve, DO Taking Active   furosemide (LASIX) 20 MG tablet [id number] No TAKE 1 TABLET EVERY DAY Jessup, Joy, NP Taking Active   insulin glargine, 2 Unit Dial, (TOUJEO MAX SOLOSTAR) 300 UNIT/ML Solostar Pen [id number] No Inject 16 Units into the skin at bedtime. Increase by 4 units every 4 days until fasting (morning) blood sugar is <150, then stay at that dose.  Patient taking differently: Inject 35 Units into the skin at bedtime. Increase by 4 units every 4 days until fasting (morning) blood sugar is <150, then stay at that dose. (Taking 35 units daily as of 06/23/21)   Emeterio Reeve, DO Taking Active            Med Note Dorene Ar Mar 24, 2021 10:11 AM) Taking 42 units as of 03/24/21   Insulin Pen Needle 29G X 12.7MM [place] [id number]  100 each by Does not apply route daily. Please dispense qty, brand, appropriate pen sizing for toujeo solostar Charna Archer, Caryl Asp, NP  Active   MODERNA COVID-19 BIVAL BOOSTER 50 MCG/0.5ML injection [id number]   [provider]  Active   Oxycodone HCl 10 MG TABS [id number]  Take  0.5-1 tablets (5-10 mg total) by mouth every 6 (six) hours as needed. 30 pills must last 90 days Samuel Bouche, NP  Active   pantoprazole (PROTONIX) 40 MG tablet [id number] No TAKE 1 TABLET EVERY DAY Samuel Bouche, NP Taking Active   potassium chloride (KLOR-CON M) 10 MEQ tablet [id number] No TAKE 1 TABLET EVERY DAY Jessup, Joy, NP Taking Active   rosuvastatin (CRESTOR) 40 MG tablet [id number] No TAKE 1 TABLET (40 MG TOTAL) BY MOUTH DAILY. OFFICE VISIT REQUIRED  PRIOR TO ANY FURTHER REFILLS Jessup, Joy, NP Taking Active   TRUE METRIX BLOOD GLUCOSE TEST test strip [id number]  TEST UP TO FOUR TIMES DAILY AS DIRECTED Jessup, Joy, NP  Active   valsartan (DIOVAN) 40 MG tablet [id number] No Take 1 tablet (40 mg total) by mouth daily. 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)    Vitamin D deficiency 12/28/2018   Erectile dysfunction 12/28/2018   PVC (premature ventricular contraction) 12/27/2018   Essential hypertension 10/11/2018   Coronary artery disease 10/11/2018   Peripheral vascular disease (HCC) 10/11/2018   Mixed hyperlipidemia 10/11/2018   Osteoarthritis 10/11/2018   CKD (chronic kidney disease) stage 4, GFR 15-29 ml/min (HCC) 10/11/2018   History of anemia 10/11/2018   Other chronic pain 06/08/2018   Vaccine refused by patient 06/07/2018   Abnormal stress test 04/10/2018   Non-traumatic rhabdomyolysis 02/01/2018   Hypokalemia 02/01/2018   Hypophosphatemia 02/01/2018   Perianal abscess 10/16/2017   Nicotine dependence with current use 10/12/2017   Perirectal abscess 10/12/2017   Drug-induced constipation 10/12/2017   GI bleed  09/15/2017   Elevated troponin 06/15/2017   Chronic diastolic heart failure (HCC) 06/15/2017   Normocytic anemia 06/15/2017   Type 2 diabetes mellitus with diabetic neuropathy, with long-term current use of insulin (HCC) 04/24/2017   History of cerebrovascular accident 09/20/2016   Bone lesion 09/29/2014   Finger pain, right 09/29/2014   S/P coronary artery stent placement 12/20/2012   Old myocardial infarction 10/27/2010    Immunization History  Administered Date(s) Administered   Janssen (J&J) SARS-COV-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 12/04/2019   PFIZER(Purple Top)SARS-COV-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 06/10/2020    Conditions to be addressed/monitored: HTN, HLD, and DMII  Care Plan : Medication Management  Updates made by Kline, Keesha J, RPH since 03/08/2022 12:00 AM     Problem: Diabetes, HTN, HLD   Priority: High     Long-Range Goal: Disease Progression Prevention   Start Date: 12/23/2020  Recent Progress: On track  Priority: High  Note:   Current Barriers:  Unable to independently afford treatment regimen Unable to achieve control of diabetes   Pharmacist Clinical Goal(s):  Over the next 30 days, patient will verbalize ability to afford treatment regimen achieve adherence to monitoring guidelines and medication adherence to achieve therapeutic efficacy adhere to prescribed medication regimen as evidenced by fill history & verbal confirmation of patient assistance status  through collaboration with PharmD and provider.   Interventions: 1:1 collaboration with Jessup, Joy, NP regarding development and update of comprehensive plan of care as evidenced by provider attestation and co-signature Inter-disciplinary care team collaboration (see longitudinal plan of care) Comprehensive medication review performed; medication list updated in electronic medical record  Diabetes:  Uncontrolled: toujeo 36 units daily, ozempic 0.25mg weekly  Previous glucose readings:  90-100s  Denies hypoglycemic/hyperglycemic  symptoms  Previously discussed current meal patterns: breakfast: eggs, hashbrown, sausage or bacon, corn beef hash; lunch: skips lunch; dinner: varies - taco, burrito, spaghetti, cheeseburgers, pork sausage; drinks: Mt Dew (2 per day)  Current exercise: limited by residual stroke deficits but does shoulder rotations  Recommended goal of continuing   green vegetable on plate 3x per week, NOW ADDING the goal of 2 MtDews per day, maximum, aiming for 1 per day. ,      Continue current regimen, begin GLP1 for ASCVD benefit with goal of reducing insulin, minimizing hypoglycemia, and further achieving BG control. Pending novonordisk patient assistance for ozempic.  Hypertension:  Controlled; current treatment: furosemide 31m daily, valsartan 41mdaily  Current home readings: not checking at home  Denies hypotensive/hypertensive symptoms  Recommended continue current regimen, patient will bring in his BP monitor next visit to learn how to use it.    Hyperlipidemia:  Controlled, current treatment: Rosuvastatin 4050m  Medications previously tried: N/A  Educated on multiple benefits of statin therapy Recommended continue current regimen  Patient Goals/Self-Care Activities Over the next 30 days, patient will:   take medications as prescribed, check glucose 2x day (morning and afternoon), document, and provide at future appointments, collaborate with provider on medication access solutions, and engage in dietary modifications by choosing a green vegetable at least 3x per week and aim to decrease to 1-2 Mt Dew per day at most  Follow Up Plan: Telephone follow up appointment with care management team member scheduled for:  1 month          Medication Assistance: Patient assistance for toujeo ([employer] PAP) approved 12/28/20, medication delivered to primary care office 01/06/21, 90DS will continue to be delivered. Recertification of application for [id number] can be completed July 22, 2021.  [id number] [id number]: [phone]: [phone] [address]one: [phone]: [phone] pill box? Yes Pt endorses 100% compliance  Follow Up:  Patient agrees to Care Plan and Follow-up.  Plan: Telephone follow up appointment with care management team member scheduled for:  1 month  KeeLarinda ButteryharmD Clinical Pharmacist [facility][phone]

## 2022-03-09 ENCOUNTER — Telehealth: Payer: Self-pay

## 2022-03-09 NOTE — Telephone Encounter (Signed)
Please contact [facility] back with the following information:   Joseph Grimes oxycodone therapy has been greatly interrupted by multiple issues with the prescription and the pharmacy. He has been on this medication long-term and until recently have had no issues getting his medication to him as prescribed. Please let them know that this has become very frustrating for both patient and prescriber.   He has 3 different ICD 10 codes that correlate to the Oxycodone:  1. M19.90- Osteoarthritis  2. G89.29- Chronic pain  3. F11.90- Chronic opioid use   He does not have a diagnosis of Substance Abuse Disorder.  ___________________________________________ Clearnce Sorrel, DNP, APRN, FNP-BC [facility] [place]

## 2022-03-09 NOTE — Therapy (Signed)
OUTPATIENT PHYSICAL THERAPY LOWER EXTREMITY EVALUATION   Patient Name: Joseph Grimes MRN: [id number] DOB:10/01/1960, 61 y.o., male Today's Date: 03/10/2022   PT End of Session - 03/10/22 1047     Visit Number 1    Number of Visits 12    Date for PT Re-Evaluation 04/21/22    Authorization Type [employer] MCR    Progress Note Due on Visit 10    PT Start Time 1050    PT Stop Time 1143    PT Time Calculation (min) 53 min    Activity Tolerance Patient tolerated treatment well    Behavior During Therapy WFL for tasks assessed/performed             Past Medical History:  Diagnosis Date   CKD (chronic kidney disease) stage 3, GFR 30-59 ml/min ([place]) 10/11/2018   Coronary artery disease 10/11/2018   Diabetes ([place])    Diabetes mellitus type 2, controlled, with complications ([place]) 09/12/9561   Essential hypertension 10/11/2018   GERD (gastroesophageal reflux disease)    Heart attack ([place]) 2006   stent placed in right coronary artery   High cholesterol    History of anemia 10/11/2018   History of low potassium    Low hemoglobin    Mixed hyperlipidemia 10/11/2018   Peripheral vascular disease ([place]) 10/11/2018   Stroke ([place])    Tobacco dependence 10/11/2018   Past Surgical History:  Procedure Laterality Date   AMPUTATION Left 07/19/2019   Procedure: LEFT PARTIAL THUMB AMPUTATION;  Surgeon: Leandrew Koyanagi, MD;  Location: [facility];  Service: Orthopedics;  Laterality: Left;  with digital block   CARDIAC CATHETERIZATION  2006   04/18/18: no stents, stent in '06   FEMORAL-POPLITEAL BYPASS GRAFT Right 05/12/2017   HAND SURGERY     KNEE ARTHROSCOPY     bilat   TOE AMPUTATION     x2   TONSILLECTOMY     Patient Active Problem List   Diagnosis Date Noted   Abnormal renal function 03/02/2022   Leukocytosis 03/02/2022   Chronic, continuous use of opioids 03/02/2022   Pilonidal cyst 11/25/2019   AKI (acute kidney injury) ([place]) 11/03/2019   Hyponatremia 11/03/2019   Sepsis ([place])  11/03/2019   Weakness 11/03/2019   Osteomyelitis of finger of left hand ([place])    Vitamin D deficiency 12/28/2018   Erectile dysfunction 12/28/2018   PVC (premature ventricular contraction) 12/27/2018   Essential hypertension 10/11/2018   Coronary artery disease 10/11/2018   Peripheral vascular disease ([place]) 10/11/2018   Mixed hyperlipidemia 10/11/2018   Osteoarthritis 10/11/2018   CKD (chronic kidney disease) stage 4, GFR 15-29 ml/min (HCC) 10/11/2018   History of anemia 10/11/2018   Other chronic pain 06/08/2018   Vaccine refused by patient 06/07/2018   Abnormal stress test 04/10/2018   Non-traumatic rhabdomyolysis 02/01/2018   Hypokalemia 02/01/2018   Hypophosphatemia 02/01/2018   Perianal abscess 10/16/2017   Nicotine dependence with current use 10/12/2017   Perirectal abscess 10/12/2017   Drug-induced constipation 10/12/2017   GI bleed 09/15/2017   Elevated troponin 06/15/2017   Chronic diastolic heart failure ([place]) 06/15/2017   Normocytic anemia 06/15/2017   Type 2 diabetes mellitus with diabetic neuropathy, with long-term current use of insulin ([place]) 04/24/2017   History of cerebrovascular accident 09/20/2016   Bone lesion 09/29/2014   Finger pain, right 09/29/2014   S/P coronary artery stent placement 12/20/2012   Old myocardial infarction 10/27/2010    PCP: Samuel Bouche, NP  REFERRING PROVIDER: Samuel Bouche, NP  REFERRING  DIAG: M19.90 (ICD-10-CM) - Osteoarthritis, unspecified osteoarthritis type, unspecified site  THERAPY DIAG:  Muscle weakness (generalized)  Unsteadiness on feet  Rationale for Evaluation and Treatment Rehabilitation  ONSET DATE: 2018 after stroke  SUBJECTIVE:   SUBJECTIVE STATEMENT: Patient wants to build up the strength in his arms and legs. Would like to be able to get up off the ground if he needs too.    PERTINENT HISTORY: DM, HTN, PVD, neuropathy, CAD, MI 2006, Rt toe amputatlons x 2, Lt finger partial amputation, bil knee scopes x  2, CVA around 2018 with R sided weakness  PAIN:  Are you having pain? No  PRECAUTIONS: Fall  WEIGHT BEARING RESTRICTIONS No  FALLS:  Has patient fallen in last 6 months? No  LIVING ENVIRONMENT: Lives with: lives with their daughter Lives in: House/apartment Stairs: No Has following equipment at home: Single point cane, Environmental consultant - 2 wheeled, and shower chair  OCCUPATION: retired  PLOF: Independent with household mobility with device  PATIENT GOALS Get stronger and be able to get up off the floor.   OBJECTIVE:   DIAGNOSTIC FINDINGS: N/A    SENSATION: Reports numbness in R side of body   MUSCLE LENGTH: Marked bil HS, Left Gastroc, hips WNL  POSTURE: rounded shoulders and forward head   LOWER EXTREMITY ROM:  WFL  LOWER EXTREMITY MMT:  MMT Right eval Left eval  Hip flexion 4- 4  Hip extension 4- 4-  Hip abduction 4 4  Hip adduction 4- 5  Hip internal rotation 4+ 5  Hip external rotation 4 5  Knee flexion 4+ 4+  Knee extension 4+ 5  Ankle dorsiflexion 5 4+   (Blank rows = not tested)    FUNCTIONAL TESTS:  5 times sit to stand: 19.93 sec using BUE  (norm for age is 11.4 sec) Berg Balance Scale: 45/56 indicates SPC at all times Dynamic Gait Index: 17/24   (< 19/24 = predictive of falls in the elderly)  Squats: only able to do partial Lunges bil: only able to do partial and needs one UE support  GAIT: Distance walked: 40 Assistive device utilized: Single point cane Level of assistance: Modified independence Comments: wide BOS    TODAY'S TREATMENT: See Pt Ed   PATIENT EDUCATION:  Education details: POC reviewed, HEP  Person educated: Patient Education method: Consulting civil engineer, Media planner, Verbal cues, and Handouts Education comprehension: verbalized understanding and returned demonstration   HOME EXERCISE PROGRAM: Access Code: [email or web address] URL: [email or web address] Date: 03/10/2022 Prepared by: Almyra Free  Exercises - Supine  Bridge  - 2 x daily - 7 x weekly - 1-3 sets - 10 reps - Standing Hip Abduction with Counter Support  - 1 x daily - 7 x weekly - 1-3 sets - 10 reps - Standing March with Counter Support  - 1 x daily - 7 x weekly - 1-3 sets - 10 reps - Standing Hip Extension with Counter Support  - 1 x daily - 7 x weekly - 1-3 sets - 10 reps - Sit to Stand with Armchair  - 2-3 x daily - 7 x weekly - 1 sets - 10 reps  ASSESSMENT:  CLINICAL IMPRESSION: Patient is a 61 y.o. male who was seen today for physical therapy evaluation and treatment for osteoarthritis pain and general weakness. He reports regular LOB so balance was assessed as well. He demonstrates B LE weakness limiting squatting, lunging and his ability to stand without UE assist.  He reports difficulty getting up from the floor as well. His DGI inidicates  he is at risk for falls. He will benefit from skilled PT to address these deficits.     OBJECTIVE IMPAIRMENTS Abnormal gait, decreased balance, decreased ROM, decreased strength, impaired flexibility, postural dysfunction, and pain.   ACTIVITY LIMITATIONS squatting, transfers, and locomotion level  PARTICIPATION LIMITATIONS: community activity and yard work  PERSONAL FACTORS Fitness, Time since onset of injury/illness/exacerbation, and 3+ comorbidities: DM, HTN, PVD, neuropathy, CKD, CAD, MI 2006, toe amputatlons x 2, bil knee scopes  are also affecting patient's functional outcome.   REHAB POTENTIAL: Good  CLINICAL DECISION MAKING: Stable/uncomplicated  EVALUATION COMPLEXITY: Low  GOALS: Goals reviewed with patient? Yes  SHORT TERM GOALS: Target date: 03/24/2022 (Remove Blue Hyperlink)  Patient will be independent with initial HEP. Baseline:  Goal status: INITIAL  LONG TERM GOALS: Target date: 04/21/2022 (Remove Blue Hyperlink)  Patient will be independent with advanced/ongoing HEP to improve outcomes and carryover.  Baseline:  Goal status: INITIAL  2.  Patient will report at least  50% improvement in mobility to improve QOL. Baseline:  Goal status: INITIAL  3.  Patient will demonstrate improved functional LE strength as demonstrated by a 5x sit to stand in <= 11.4 seconds. Baseline:  Goal status: INITIAL  4.  Patient will be able to complete a floor to stand transfer with or without UE support to improve function at home. Baseline:  Goal status: INITIAL  5.  Patient will demo improved BERG score from  45 to 50 to demonstrate improved functional ability. Baseline: 45 Goal status: INITIAL  6.  Patient will demonstrate at least 19/24 on DGI to decrease risk of falls. Baseline: 17 Goal status: INITIAL      PLAN: PT FREQUENCY: 2x/week  PT DURATION: 6 weeks  PLANNED INTERVENTIONS: Therapeutic exercises, Therapeutic activity, Neuromuscular re-education, Balance training, Gait training, Patient/Family education, Self Care, Stair training, Aquatic Therapy, Dry Needling, Cryotherapy, Moist heat, and Manual therapy  PLAN FOR NEXT SESSION: work on high level balance, floor to stand transfers, sit to stand, squats/lunges   Arlenne Kimbley, PT 03/10/2022, 3:30 PM

## 2022-03-09 NOTE — Telephone Encounter (Signed)
[facility] called to get clarification the Oxycodone 10 mg.  The ICD code is [phone] and that is for Substance abuse disorder and they are not able to fill opioids for dependence.  Contact [phone]  Reference #: [id number]

## 2022-03-10 ENCOUNTER — Ambulatory Visit: Payer: Medicare HMO | Attending: Medical-Surgical | Admitting: Physical Therapy

## 2022-03-10 ENCOUNTER — Encounter: Payer: Self-pay | Admitting: Physical Therapy

## 2022-03-10 ENCOUNTER — Other Ambulatory Visit: Payer: Self-pay

## 2022-03-10 DIAGNOSIS — R2681 Unsteadiness on feet: Secondary | ICD-10-CM | POA: Insufficient documentation

## 2022-03-10 DIAGNOSIS — M1990 Unspecified osteoarthritis, unspecified site: Secondary | ICD-10-CM | POA: Diagnosis not present

## 2022-03-10 DIAGNOSIS — M6281 Muscle weakness (generalized): Secondary | ICD-10-CM | POA: Insufficient documentation

## 2022-03-14 NOTE — Telephone Encounter (Signed)
Task completed. Spoke to the pharmacist [facility] regarding ICD 10 codes for oxycodone rx. Per [employer], the F11.90 - chronic opoid use is causing the rx to be flagged by the system because rx/dx should be "regulated and dispensed" by a recovery center pharmacy. This was why there was a delay in mailing patient's pain medication. The pharmacist mentioned that she will be able to authorize the 3 rxs on file with the dxs codes M19.90 and G89.29. She apologizes for any inconvenience this may have caused the patient and the provider.

## 2022-03-16 ENCOUNTER — Ambulatory Visit: Payer: Medicare HMO | Admitting: Rehabilitative and Restorative Service Providers"

## 2022-03-16 ENCOUNTER — Encounter: Payer: Self-pay | Admitting: Rehabilitative and Restorative Service Providers"

## 2022-03-16 DIAGNOSIS — M6281 Muscle weakness (generalized): Secondary | ICD-10-CM | POA: Diagnosis not present

## 2022-03-16 DIAGNOSIS — R2681 Unsteadiness on feet: Secondary | ICD-10-CM

## 2022-03-16 DIAGNOSIS — M1990 Unspecified osteoarthritis, unspecified site: Secondary | ICD-10-CM | POA: Diagnosis not present

## 2022-03-16 NOTE — Therapy (Signed)
OUTPATIENT PHYSICAL THERAPY TREATMENT  Patient Name: Joseph Grimes MRN: [id number] DOB:[birth year], 61 y.o., male Today's Date: 03/16/2022   PT End of Session - 03/16/22 1005     Visit Number 2    Number of Visits 12    Date for PT Re-Evaluation 04/21/22    Authorization Type [employer] MCR    Progress Note Due on Visit 10    PT Start Time 1308    PT Stop Time 1057    PT Time Calculation (min) 42 min    Activity Tolerance Patient tolerated treatment well    Behavior During Therapy WFL for tasks assessed/performed             Past Medical History:  Diagnosis Date   CKD (chronic kidney disease) stage 3, GFR 30-59 ml/min ([place]) 10/11/2018   Coronary artery disease 10/11/2018   Diabetes ([place])    Diabetes mellitus type 2, controlled, with complications ([place]) 01/10/7845   Essential hypertension 10/11/2018   GERD (gastroesophageal reflux disease)    Heart attack ([place]) 2006   stent placed in right coronary artery   High cholesterol    History of anemia 10/11/2018   History of low potassium    Low hemoglobin    Mixed hyperlipidemia 10/11/2018   Peripheral vascular disease ([place]) 10/11/2018   Stroke ([place])    Tobacco dependence 10/11/2018   Past Surgical History:  Procedure Laterality Date   AMPUTATION Left 07/19/2019   Procedure: LEFT PARTIAL THUMB AMPUTATION;  Surgeon: Leandrew Koyanagi, MD;  Location: [facility];  Service: Orthopedics;  Laterality: Left;  with digital block   CARDIAC CATHETERIZATION  2006   04/18/18: no stents, stent in '06   FEMORAL-POPLITEAL BYPASS GRAFT Right 05/12/2017   HAND SURGERY     KNEE ARTHROSCOPY     bilat   TOE AMPUTATION     x2   TONSILLECTOMY     Patient Active Problem List   Diagnosis Date Noted   Abnormal renal function 03/02/2022   Leukocytosis 03/02/2022   Chronic, continuous use of opioids 03/02/2022   Pilonidal cyst 11/25/2019   AKI (acute kidney injury) ([place]) 11/03/2019   Hyponatremia 11/03/2019   Sepsis ([place]) 11/03/2019    Weakness 11/03/2019   Osteomyelitis of finger of left hand ([place])    Vitamin D deficiency 12/28/2018   Erectile dysfunction 12/28/2018   PVC (premature ventricular contraction) 12/27/2018   Essential hypertension 10/11/2018   Coronary artery disease 10/11/2018   Peripheral vascular disease ([place]) 10/11/2018   Mixed hyperlipidemia 10/11/2018   Osteoarthritis 10/11/2018   CKD (chronic kidney disease) stage 4, GFR 15-29 ml/min ([facility]) 10/11/2018   History of anemia 10/11/2018   Other chronic pain 06/08/2018   Vaccine refused by patient 06/07/2018   Abnormal stress test 04/10/2018   Non-traumatic rhabdomyolysis 02/01/2018   Hypokalemia 02/01/2018   Hypophosphatemia 02/01/2018   Perianal abscess 10/16/2017   Nicotine dependence with current use 10/12/2017   Perirectal abscess 10/12/2017   Drug-induced constipation 10/12/2017   GI bleed 09/15/2017   Elevated troponin 06/15/2017   Chronic diastolic heart failure ([place]) 06/15/2017   Normocytic anemia 06/15/2017   Type 2 diabetes mellitus with diabetic neuropathy, with long-term current use of insulin ([place]) 04/24/2017   History of cerebrovascular accident 09/20/2016   Bone lesion 09/29/2014   Finger pain, right 09/29/2014   S/P coronary artery stent placement 12/20/2012   Old myocardial infarction 10/27/2010    PCP: Samuel Bouche, NP  REFERRING PROVIDER: Samuel Bouche, NP  REFERRING DIAG: M19.90 (ICD-10-CM) -  Osteoarthritis, unspecified osteoarthritis type, unspecified site  THERAPY DIAG:  Muscle weakness (generalized)  Unsteadiness on feet  Rationale for Evaluation and Treatment Rehabilitation  ONSET DATE: 2018 after stroke  SUBJECTIVE:   SUBJECTIVE STATEMENT: The patient reports he didn't do initial HEP because he takes ozempic and had illness over the weekend.  He began feeling better yesterday.   He has not had any falls.   PERTINENT HISTORY: DM, HTN, PVD, neuropathy, CAD, MI 2006, Rt toe amputatlons x 2, Lt finger partial  amputation, bil knee scopes x 2, CVA around 2018 with R sided weakness  PAIN:  Are you having pain? No  PRECAUTIONS: Fall  WEIGHT BEARING RESTRICTIONS No  FALLS:  Has patient fallen in last 6 months? No  LIVING ENVIRONMENT: Lives with: lives with their daughter Lives in: House/apartment Stairs: No Has following equipment at home: Single point cane, Environmental consultant - 2 wheeled, and shower chair  OCCUPATION: retired  PLOF: Independent with household mobility with device  PATIENT GOALS Get stronger and be able to get up off the floor.   OBJECTIVE:  TREATMENT 03/16/22:  THERE EX: Sit to stand x 10 reps with some postural correction due to posterior leaning Supine bridges x 10 reps with cues for longer holds Supine trunk rotation x 5 reps  Standing hip extension x 10 reps Standing hip abduction x 10 reps Standing marches x 10 reps Wall slides x 10 reps Standing gastroc stretch x 30 second holds x 2 reps Standing heel raises x 10 reps  NMR: Compliant surfaces with head motion x 10 reps in corner with CGA Compliant surfaces with eyes closed x 3 reps x 20 seconds in corner with min A Picking up cones from the floor x 8 reps with CGA Sidestepping for balance and LE strength x 10 feet x 4 reps Standing rocking anteriorly/posteriorly in stride for weight shifting   GT: Gait with SPC x 160 feet, 80 feet working on upright posture with close supervision Gait without device x 15 feet x 2 with CGA for safety *gait characterized by dec'd ankle DF bilat     PATIENT EDUCATION:  Education details: POC reviewed, HEP  Person educated: Patient Education method: Consulting civil engineer, Demonstration, Verbal cues, and Handouts Education comprehension: verbalized understanding and returned demonstration   HOME EXERCISE PROGRAM: Access Code: [email or web address] URL: [email or web address] Date: 03/16/2022 Prepared by: Rudell Cobb  Exercises - Supine Bridge  - 2 x daily - 7 x weekly - 1-3  sets - 10 reps - Sit to Stand with Armchair  - 2-3 x daily - 7 x weekly - 1 sets - 10 reps - Standing Hip Abduction with Counter Support  - 1 x daily - 7 x weekly - 1-3 sets - 10 reps - Standing March with Counter Support  - 1 x daily - 7 x weekly - 1-3 sets - 10 reps - Standing Hip Extension with Counter Support  - 1 x daily - 7 x weekly - 1-3 sets - 10 reps - Heel Raises with Counter Support  - 2 x daily - 7 x weekly - 1 sets - 10 reps - Standing Gastroc Stretch  - 2 x daily - 7 x weekly - 1 sets - 2 reps - 30 seconds hold  ASSESSMENT:  CLINICAL IMPRESSION: The patient was not able to initiate HEP due to illness (notes may be med related and plans to speak with MD).  The patient tolerated LE strengthening, balance, and gait activities well today.  Plan to continue to  progress focusing on power through LEs, balance, dynamic standing/gait tasks.  OBJECTIVE IMPAIRMENTS Abnormal gait, decreased balance, decreased ROM, decreased strength, impaired flexibility, postural dysfunction, and pain.   ACTIVITY LIMITATIONS squatting, transfers, and locomotion level  PARTICIPATION LIMITATIONS: community activity and yard work  PERSONAL FACTORS Fitness, Time since onset of injury/illness/exacerbation, and 3+ comorbidities: DM, HTN, PVD, neuropathy, CKD, CAD, MI 2006, toe amputatlons x 2, bil knee scopes  are also affecting patient's functional outcome.   REHAB POTENTIAL: Good  CLINICAL DECISION MAKING: Stable/uncomplicated  EVALUATION COMPLEXITY: Low  GOALS: Goals reviewed with patient? Yes  SHORT TERM GOALS: Target date: 03/24/2022 (Remove Blue Hyperlink)  Patient will be independent with initial HEP. Baseline:  Goal status: INITIAL  LONG TERM GOALS: Target date: 04/21/2022 (Remove Blue Hyperlink)  Patient will be independent with advanced/ongoing HEP to improve outcomes and carryover.  Baseline:  Goal status: INITIAL  2.  Patient will report at least 50% improvement in mobility to improve  QOL. Baseline:  Goal status: INITIAL  3.  Patient will demonstrate improved functional LE strength as demonstrated by a 5x sit to stand in <= 11.4 seconds. Baseline:  Goal status: INITIAL  4.  Patient will be able to complete a floor to stand transfer with or without UE support to improve function at home. Baseline:  Goal status: INITIAL  5.  Patient will demo improved BERG score from  45 to 50 to demonstrate improved functional ability. Baseline: 45 Goal status: INITIAL  6.  Patient will demonstrate at least 19/24 on DGI to decrease risk of falls. Baseline: 17 Goal status: INITIAL      PLAN: PT FREQUENCY: 2x/week  PT DURATION: 6 weeks  PLANNED INTERVENTIONS: Therapeutic exercises, Therapeutic activity, Neuromuscular re-education, Balance training, Gait training, Patient/Family education, Self Care, Stair training, Aquatic Therapy, Dry Needling, Cryotherapy, Moist heat, and Manual therapy  PLAN FOR NEXT SESSION: compliant surface standing, work on high level balance, floor to stand transfers, sit to stand, squats/lunges   [place], PT 03/16/2022, 10:06 AM

## 2022-03-18 ENCOUNTER — Encounter: Payer: Self-pay | Admitting: Physical Therapy

## 2022-03-18 ENCOUNTER — Ambulatory Visit: Payer: Medicare HMO | Admitting: Physical Therapy

## 2022-03-18 DIAGNOSIS — M6281 Muscle weakness (generalized): Secondary | ICD-10-CM | POA: Diagnosis not present

## 2022-03-18 DIAGNOSIS — M1990 Unspecified osteoarthritis, unspecified site: Secondary | ICD-10-CM | POA: Diagnosis not present

## 2022-03-18 DIAGNOSIS — R2681 Unsteadiness on feet: Secondary | ICD-10-CM

## 2022-03-18 NOTE — Therapy (Signed)
OUTPATIENT PHYSICAL THERAPY TREATMENT  Patient Name: FREEDOM PEDDY MRN: [id number] DOB:1961-05-30, 61 y.o., male Today's Date: 03/18/2022   PT End of Session - 03/18/22 1100     Visit Number 3    Number of Visits 12    Date for PT Re-Evaluation 04/21/22    Authorization Type [employer] MCR    Authorization - Visit Number 3    Progress Note Due on Visit 10    PT Start Time 7846    PT Stop Time 1057    PT Time Calculation (min) 42 min    Activity Tolerance Patient tolerated treatment well    Behavior During Therapy WFL for tasks assessed/performed              Past Medical History:  Diagnosis Date   CKD (chronic kidney disease) stage 3, GFR 30-59 ml/min ([place]) 10/11/2018   Coronary artery disease 10/11/2018   Diabetes ([place])    Diabetes mellitus type 2, controlled, with complications ([place]) 04/14/2951   Essential hypertension 10/11/2018   GERD (gastroesophageal reflux disease)    Heart attack ([place]) 2006   stent placed in right coronary artery   High cholesterol    History of anemia 10/11/2018   History of low potassium    Low hemoglobin    Mixed hyperlipidemia 10/11/2018   Peripheral vascular disease ([place]) 10/11/2018   Stroke ([place])    Tobacco dependence 10/11/2018   Past Surgical History:  Procedure Laterality Date   AMPUTATION Left 07/19/2019   Procedure: LEFT PARTIAL THUMB AMPUTATION;  Surgeon: Leandrew Koyanagi, MD;  Location: [facility];  Service: Orthopedics;  Laterality: Left;  with digital block   CARDIAC CATHETERIZATION  2006   04/18/18: no stents, stent in '06   FEMORAL-POPLITEAL BYPASS GRAFT Right 05/12/2017   HAND SURGERY     KNEE ARTHROSCOPY     bilat   TOE AMPUTATION     x2   TONSILLECTOMY     Patient Active Problem List   Diagnosis Date Noted   Abnormal renal function 03/02/2022   Leukocytosis 03/02/2022   Chronic, continuous use of opioids 03/02/2022   Pilonidal cyst 11/25/2019   AKI (acute kidney injury) ([place]) 11/03/2019   Hyponatremia  11/03/2019   Sepsis ([place]) 11/03/2019   Weakness 11/03/2019   Osteomyelitis of finger of left hand ([place])    Vitamin D deficiency 12/28/2018   Erectile dysfunction 12/28/2018   PVC (premature ventricular contraction) 12/27/2018   Essential hypertension 10/11/2018   Coronary artery disease 10/11/2018   Peripheral vascular disease ([place]) 10/11/2018   Mixed hyperlipidemia 10/11/2018   Osteoarthritis 10/11/2018   CKD (chronic kidney disease) stage 4, GFR 15-29 ml/min ([facility]) 10/11/2018   History of anemia 10/11/2018   Other chronic pain 06/08/2018   Vaccine refused by patient 06/07/2018   Abnormal stress test 04/10/2018   Non-traumatic rhabdomyolysis 02/01/2018   Hypokalemia 02/01/2018   Hypophosphatemia 02/01/2018   Perianal abscess 10/16/2017   Nicotine dependence with current use 10/12/2017   Perirectal abscess 10/12/2017   Drug-induced constipation 10/12/2017   GI bleed 09/15/2017   Elevated troponin 06/15/2017   Chronic diastolic heart failure ([place]) 06/15/2017   Normocytic anemia 06/15/2017   Type 2 diabetes mellitus with diabetic neuropathy, with long-term current use of insulin ([place]) 04/24/2017   History of cerebrovascular accident 09/20/2016   Bone lesion 09/29/2014   Finger pain, right 09/29/2014   S/P coronary artery stent placement 12/20/2012   Old myocardial infarction 10/27/2010    PCP: Samuel Bouche, NP  REFERRING  PROVIDER: Samuel Bouche, NP  REFERRING DIAG: M19.90 (ICD-10-CM) - Osteoarthritis, unspecified osteoarthritis type, unspecified site  THERAPY DIAG:  Muscle weakness (generalized)  Unsteadiness on feet  Rationale for Evaluation and Treatment Rehabilitation  ONSET DATE: 2018 after stroke  SUBJECTIVE:   SUBJECTIVE STATEMENT: Pt states he feels off balance "all the time". He states "if I miss a step, I'm going to know about it"   PERTINENT HISTORY: DM, HTN, PVD, neuropathy, CAD, MI 2006, Rt toe amputatlons x 2, Lt finger partial amputation, bil knee  scopes x 2, CVA around 2018 with R sided weakness  PAIN:  Are you having pain? No  PRECAUTIONS: Fall  PATIENT GOALS Get stronger and be able to get up off the floor.   OBJECTIVE:  03/18/22 Gait with SPC x 160 feet, 80 feet working on upright posture with close supervision Gait without AD with close supervision 110', 50' Gait with obstacle negotiation stepping over obstacles and up 6'' and 8'' step with min A Side stepping over pool noodle min A 2 x 10  Sit to stand x 10 with UE support, cues for upright posture Resisted walking green TB x 10 fwd, x10 bkwd Tap ups 6'' step 2 x 10 bilat with light UE support Standing 2 x 10 heel raises, hip abd, hip ext, marches  Standing on foam: head turns x 10 CGA, EC 3 x 20 sec with min A due to posterior lean Rockerboard A/P with UE support x 1 min, laterally x 1 min   TREATMENT 03/16/22:  THERE EX: Sit to stand x 10 reps with some postural correction due to posterior leaning Supine bridges x 10 reps with cues for longer holds Supine trunk rotation x 5 reps  Standing hip extension x 10 reps Standing hip abduction x 10 reps Standing marches x 10 reps Wall slides x 10 reps Standing gastroc stretch x 30 second holds x 2 reps Standing heel raises x 10 reps  NMR: Compliant surfaces with head motion x 10 reps in corner with CGA Compliant surfaces with eyes closed x 3 reps x 20 seconds in corner with min A Picking up cones from the floor x 8 reps with CGA Sidestepping for balance and LE strength x 10 feet x 4 reps Standing rocking anteriorly/posteriorly in stride for weight shifting   GT: Gait with SPC x 160 feet, 80 feet working on upright posture with close supervision Gait without device x 15 feet x 2 with CGA for safety *gait characterized by dec'd ankle DF bilat     PATIENT EDUCATION:  Education details: POC reviewed, HEP  Person educated: Patient Education method: Consulting civil engineer, Demonstration, Verbal cues, and Handouts Education  comprehension: verbalized understanding and returned demonstration   HOME EXERCISE PROGRAM: Access Code: [email or web address] URL: [email or web address] Date: 03/16/2022 Prepared by: Rudell Cobb  Exercises - Supine Bridge  - 2 x daily - 7 x weekly - 1-3 sets - 10 reps - Sit to Stand with Armchair  - 2-3 x daily - 7 x weekly - 1 sets - 10 reps - Standing Hip Abduction with Counter Support  - 1 x daily - 7 x weekly - 1-3 sets - 10 reps - Standing March with Counter Support  - 1 x daily - 7 x weekly - 1-3 sets - 10 reps - Standing Hip Extension with Counter Support  - 1 x daily - 7 x weekly - 1-3 sets - 10 reps - Heel Raises with Counter Support  - 2 x daily - 7 x  weekly - 1 sets - 10 reps - Standing Gastroc Stretch  - 2 x daily - 7 x weekly - 1 sets - 2 reps - 30 seconds hold  ASSESSMENT:  CLINICAL IMPRESSION: Continued to progress dynamic gait and balance. Pt improving static balance, continues to be challenged by dynamic gait. Difficulty stepping up onto 8'' step, will benefit from continued LE strengthening    GOALS: Goals reviewed with patient? Yes  SHORT TERM GOALS: Target date: 03/24/2022 (Remove Blue Hyperlink)  Patient will be independent with initial HEP. Baseline:  Goal status: INITIAL  LONG TERM GOALS: Target date: 04/21/2022 (Remove Blue Hyperlink)  Patient will be independent with advanced/ongoing HEP to improve outcomes and carryover.  Baseline:  Goal status: INITIAL  2.  Patient will report at least 50% improvement in mobility to improve QOL. Baseline:  Goal status: INITIAL  3.  Patient will demonstrate improved functional LE strength as demonstrated by a 5x sit to stand in <= 11.4 seconds. Baseline:  Goal status: INITIAL  4.  Patient will be able to complete a floor to stand transfer with or without UE support to improve function at home. Baseline:  Goal status: INITIAL  5.  Patient will demo improved BERG score from  45 to 50 to demonstrate  improved functional ability. Baseline: 45 Goal status: INITIAL  6.  Patient will demonstrate at least 19/24 on DGI to decrease risk of falls. Baseline: 17 Goal status: INITIAL      PLAN: PT FREQUENCY: 2x/week  PT DURATION: 6 weeks  PLANNED INTERVENTIONS: Therapeutic exercises, Therapeutic activity, Neuromuscular re-education, Balance training, Gait training, Patient/Family education, Self Care, Stair training, Aquatic Therapy, Dry Needling, Cryotherapy, Moist heat, and Manual therapy  PLAN FOR NEXT SESSION: compliant surface standing, work on high level balance, floor to stand transfers, sit to stand, squats/lunges   Gage Tre[place], PT 03/18/2022, 11:01 AM

## 2022-03-23 ENCOUNTER — Ambulatory Visit: Payer: Medicare HMO | Admitting: Physical Therapy

## 2022-03-23 ENCOUNTER — Encounter: Payer: Self-pay | Admitting: Physical Therapy

## 2022-03-23 DIAGNOSIS — R2681 Unsteadiness on feet: Secondary | ICD-10-CM

## 2022-03-23 DIAGNOSIS — M1990 Unspecified osteoarthritis, unspecified site: Secondary | ICD-10-CM | POA: Diagnosis not present

## 2022-03-23 DIAGNOSIS — M6281 Muscle weakness (generalized): Secondary | ICD-10-CM

## 2022-03-23 NOTE — Therapy (Signed)
OUTPATIENT PHYSICAL THERAPY TREATMENT  Patient Name: Joseph Grimes MRN: [id number] DOB:02/03/1961, 61 y.o., male Today's Date: 03/23/2022   PT End of Session - 03/23/22 1022     Visit Number 4    Number of Visits 12    Date for PT Re-Evaluation 04/21/22    Authorization Type [employer] MCR    Authorization - Visit Number 4    Progress Note Due on Visit 10    PT Start Time 1020    PT Stop Time 1100    PT Time Calculation (min) 40 min    Activity Tolerance Patient tolerated treatment well    Behavior During Therapy WFL for tasks assessed/performed              Past Medical History:  Diagnosis Date   CKD (chronic kidney disease) stage 3, GFR 30-59 ml/min ([place]) 10/11/2018   Coronary artery disease 10/11/2018   Diabetes ([place])    Diabetes mellitus type 2, controlled, with complications ([place]) 04/10/8181   Essential hypertension 10/11/2018   GERD (gastroesophageal reflux disease)    Heart attack ([place]) 2006   stent placed in right coronary artery   High cholesterol    History of anemia 10/11/2018   History of low potassium    Low hemoglobin    Mixed hyperlipidemia 10/11/2018   Peripheral vascular disease ([place]) 10/11/2018   Stroke ([place])    Tobacco dependence 10/11/2018   Past Surgical History:  Procedure Laterality Date   AMPUTATION Left 07/19/2019   Procedure: LEFT PARTIAL THUMB AMPUTATION;  Surgeon: Leandrew Koyanagi, MD;  Location: [facility];  Service: Orthopedics;  Laterality: Left;  with digital block   CARDIAC CATHETERIZATION  2006   04/18/18: no stents, stent in '06   FEMORAL-POPLITEAL BYPASS GRAFT Right 05/12/2017   HAND SURGERY     KNEE ARTHROSCOPY     bilat   TOE AMPUTATION     x2   TONSILLECTOMY     Patient Active Problem List   Diagnosis Date Noted   Abnormal renal function 03/02/2022   Leukocytosis 03/02/2022   Chronic, continuous use of opioids 03/02/2022   Pilonidal cyst 11/25/2019   AKI (acute kidney injury) ([place]) 11/03/2019   Hyponatremia  11/03/2019   Sepsis ([place]) 11/03/2019   Weakness 11/03/2019   Osteomyelitis of finger of left hand ([place])    Vitamin D deficiency 12/28/2018   Erectile dysfunction 12/28/2018   PVC (premature ventricular contraction) 12/27/2018   Essential hypertension 10/11/2018   Coronary artery disease 10/11/2018   Peripheral vascular disease ([place]) 10/11/2018   Mixed hyperlipidemia 10/11/2018   Osteoarthritis 10/11/2018   CKD (chronic kidney disease) stage 4, GFR 15-29 ml/min (HCC) 10/11/2018   History of anemia 10/11/2018   Other chronic pain 06/08/2018   Vaccine refused by patient 06/07/2018   Abnormal stress test 04/10/2018   Non-traumatic rhabdomyolysis 02/01/2018   Hypokalemia 02/01/2018   Hypophosphatemia 02/01/2018   Perianal abscess 10/16/2017   Nicotine dependence with current use 10/12/2017   Perirectal abscess 10/12/2017   Drug-induced constipation 10/12/2017   GI bleed 09/15/2017   Elevated troponin 06/15/2017   Chronic diastolic heart failure ([place]) 06/15/2017   Normocytic anemia 06/15/2017   Type 2 diabetes mellitus with diabetic neuropathy, with long-term current use of insulin ([place]) 04/24/2017   History of cerebrovascular accident 09/20/2016   Bone lesion 09/29/2014   Finger pain, right 09/29/2014   S/P coronary artery stent placement 12/20/2012   Old myocardial infarction 10/27/2010    PCP: Samuel Bouche, NP  REFERRING  PROVIDER: Samuel Bouche, NP  REFERRING DIAG: M19.90 (ICD-10-CM) - Osteoarthritis, unspecified osteoarthritis type, unspecified site  THERAPY DIAG:  No diagnosis found.  Rationale for Evaluation and Treatment Rehabilitation  ONSET DATE: 2018 after stroke  SUBJECTIVE:   SUBJECTIVE STATEMENT: Pt states he was not able to sleep well last night. Felt restless tossing and turning.    PERTINENT HISTORY: DM, HTN, PVD, neuropathy, CAD, MI 2006, Rt toe amputatlons x 2, Lt finger partial amputation, bil knee scopes x 2, CVA around 2018 with R sided  weakness  PAIN:  Are you having pain? Yes: NPRS scale: 6/10 Pain location: R LE Pain description: Dull/achy Aggravating factors: unknown; gets this way intermittently Relieving factors: oxycodone   PRECAUTIONS: Fall  PATIENT GOALS Get stronger and be able to get up off the floor.   OBJECTIVE:  03/22/22 THEREX: Nu step warm up L5 x 5 min UEs / LEs Standing Heel/toe raises 2x10 Hip abd yellow tband 2x10 Hip ext yellow tband 2x10 Marches 2x10 Sit<>stand x10 UE support into standing, no UE support upon sitting  NMR: Forward step over 4# weight 2x10 Side step over 4# weight 2x10 Standing on foam: Head turns 2x 10 CGA Head nods 2x 10 CGA  03/18/22 Gait with SPC x 160 feet, 80 feet working on upright posture with close supervision Gait without AD with close supervision 110', 50' Gait with obstacle negotiation stepping over obstacles and up 6'' and 8'' step with min A Side stepping over pool noodle min A 2 x 10  Sit to stand x 10 with UE support, cues for upright posture Resisted walking green TB x 10 fwd, x10 bkwd Tap ups 6'' step 2 x 10 bilat with light UE support Standing 2 x 10 heel raises, hip abd, hip ext, marches  Standing on foam: head turns x 10 CGA, EC 3 x 20 sec with min A due to posterior lean Rockerboard A/P with UE support x 1 min, laterally x 1 min      PATIENT EDUCATION:  Education details: POC reviewed, HEP  Person educated: Patient Education method: Consulting civil engineer, Media planner, Verbal cues, and Handouts Education comprehension: verbalized understanding and returned demonstration   HOME EXERCISE PROGRAM: Access Code: [email or web address] URL: [email or web address] Date: 03/16/2022 Prepared by: Rudell Cobb  Exercises - Supine Bridge  - 2 x daily - 7 x weekly - 1-3 sets - 10 reps - Sit to Stand with Armchair  - 2-3 x daily - 7 x weekly - 1 sets - 10 reps - Standing Hip Abduction with Counter Support  - 1 x daily - 7 x weekly - 1-3 sets -  10 reps - Standing March with Counter Support  - 1 x daily - 7 x weekly - 1-3 sets - 10 reps - Standing Hip Extension with Counter Support  - 1 x daily - 7 x weekly - 1-3 sets - 10 reps - Heel Raises with Counter Support  - 2 x daily - 7 x weekly - 1 sets - 10 reps - Standing Gastroc Stretch  - 2 x daily - 7 x weekly - 1 sets - 2 reps - 30 seconds hold  ASSESSMENT:  CLINICAL IMPRESSION: Continued to work on progressing resistive exercises as well as static and dynamic balance. Limited by fatigue this session.    GOALS: Goals reviewed with patient? Yes  SHORT TERM GOALS: Target date: 03/24/2022 (Remove Blue Hyperlink)  Patient will be independent with initial HEP. Baseline:  Goal status: INITIAL  LONG TERM GOALS: Target date: 04/21/2022 (  Remove Blue Hyperlink)  Patient will be independent with advanced/ongoing HEP to improve outcomes and carryover.  Baseline:  Goal status: INITIAL  2.  Patient will report at least 50% improvement in mobility to improve QOL. Baseline:  Goal status: INITIAL  3.  Patient will demonstrate improved functional LE strength as demonstrated by a 5x sit to stand in <= 11.4 seconds. Baseline:  Goal status: INITIAL  4.  Patient will be able to complete a floor to stand transfer with or without UE support to improve function at home. Baseline:  Goal status: INITIAL  5.  Patient will demo improved BERG score from  45 to 50 to demonstrate improved functional ability. Baseline: 45 Goal status: INITIAL  6.  Patient will demonstrate at least 19/24 on DGI to decrease risk of falls. Baseline: 17 Goal status: INITIAL      PLAN: PT FREQUENCY: 2x/week  PT DURATION: 6 weeks  PLANNED INTERVENTIONS: Therapeutic exercises, Therapeutic activity, Neuromuscular re-education, Balance training, Gait training, Patient/Family education, Self Care, Stair training, Aquatic Therapy, Dry Needling, Cryotherapy, Moist heat, and Manual therapy  PLAN FOR NEXT SESSION:  compliant surface standing, work on high level balance, floor to stand transfers, sit to stand, squats/lunges   Annaleese Guier April Ma L Alfonzo Arca, PT, DPT 03/23/2022, 10:22 AM

## 2022-03-25 ENCOUNTER — Ambulatory Visit: Payer: Medicare HMO | Admitting: Physical Therapy

## 2022-03-25 ENCOUNTER — Encounter: Payer: Self-pay | Admitting: Physical Therapy

## 2022-03-25 DIAGNOSIS — M1990 Unspecified osteoarthritis, unspecified site: Secondary | ICD-10-CM | POA: Diagnosis not present

## 2022-03-25 DIAGNOSIS — R2681 Unsteadiness on feet: Secondary | ICD-10-CM | POA: Diagnosis not present

## 2022-03-25 DIAGNOSIS — M6281 Muscle weakness (generalized): Secondary | ICD-10-CM | POA: Diagnosis not present

## 2022-03-25 NOTE — Therapy (Signed)
OUTPATIENT PHYSICAL THERAPY TREATMENT  Patient Name: Joseph Grimes MRN: [id number] DOB:01-21-1961, 61 y.o., male Today's Date: 03/25/2022   PT End of Session - 03/25/22 1103     Visit Number 5    Number of Visits 12    Date for PT Re-Evaluation 04/21/22    Authorization - Visit Number 5    Progress Note Due on Visit 10    PT Start Time 7711    PT Stop Time 1057    PT Time Calculation (min) 42 min    Activity Tolerance Patient tolerated treatment well    Behavior During Therapy [facility] for tasks assessed/performed               Past Medical History:  Diagnosis Date   CKD (chronic kidney disease) stage 3, GFR 30-59 ml/min ([place]) 10/11/2018   Coronary artery disease 10/11/2018   Diabetes ([place])    Diabetes mellitus type 2, controlled, with complications ([place]) 01/10/7902   Essential hypertension 10/11/2018   GERD (gastroesophageal reflux disease)    Heart attack ([place]) 2006   stent placed in right coronary artery   High cholesterol    History of anemia 10/11/2018   History of low potassium    Low hemoglobin    Mixed hyperlipidemia 10/11/2018   Peripheral vascular disease ([place]) 10/11/2018   Stroke ([place])    Tobacco dependence 10/11/2018   Past Surgical History:  Procedure Laterality Date   AMPUTATION Left 07/19/2019   Procedure: LEFT PARTIAL THUMB AMPUTATION;  Surgeon: Leandrew Koyanagi, MD;  Location: [facility];  Service: Orthopedics;  Laterality: Left;  with digital block   CARDIAC CATHETERIZATION  2006   04/18/18: no stents, stent in '06   FEMORAL-POPLITEAL BYPASS GRAFT Right 05/12/2017   HAND SURGERY     KNEE ARTHROSCOPY     bilat   TOE AMPUTATION     x2   TONSILLECTOMY     Patient Active Problem List   Diagnosis Date Noted   Abnormal renal function 03/02/2022   Leukocytosis 03/02/2022   Chronic, continuous use of opioids 03/02/2022   Pilonidal cyst 11/25/2019   AKI (acute kidney injury) ([place]) 11/03/2019   Hyponatremia 11/03/2019   Sepsis ([place]) 11/03/2019    Weakness 11/03/2019   Osteomyelitis of finger of left hand ([place])    Vitamin D deficiency 12/28/2018   Erectile dysfunction 12/28/2018   PVC (premature ventricular contraction) 12/27/2018   Essential hypertension 10/11/2018   Coronary artery disease 10/11/2018   Peripheral vascular disease ([place]) 10/11/2018   Mixed hyperlipidemia 10/11/2018   Osteoarthritis 10/11/2018   CKD (chronic kidney disease) stage 4, GFR 15-29 ml/min (HCC) 10/11/2018   History of anemia 10/11/2018   Other chronic pain 06/08/2018   Vaccine refused by patient 06/07/2018   Abnormal stress test 04/10/2018   Non-traumatic rhabdomyolysis 02/01/2018   Hypokalemia 02/01/2018   Hypophosphatemia 02/01/2018   Perianal abscess 10/16/2017   Nicotine dependence with current use 10/12/2017   Perirectal abscess 10/12/2017   Drug-induced constipation 10/12/2017   GI bleed 09/15/2017   Elevated troponin 06/15/2017   Chronic diastolic heart failure ([place]) 06/15/2017   Normocytic anemia 06/15/2017   Type 2 diabetes mellitus with diabetic neuropathy, with long-term current use of insulin ([place]) 04/24/2017   History of cerebrovascular accident 09/20/2016   Bone lesion 09/29/2014   Finger pain, right 09/29/2014   S/P coronary artery stent placement 12/20/2012   Old myocardial infarction 10/27/2010    PCP: Samuel Bouche, NP  REFERRING PROVIDER: Samuel Bouche, NP  REFERRING  DIAG: M19.90 (ICD-10-CM) - Osteoarthritis, unspecified osteoarthritis type, unspecified site  THERAPY DIAG:  Muscle weakness (generalized)  Unsteadiness on feet  Rationale for Evaluation and Treatment Rehabilitation  ONSET DATE: 2018 after stroke  SUBJECTIVE:   SUBJECTIVE STATEMENT: Pt states he slept better last night. No new complaints   PERTINENT HISTORY: DM, HTN, PVD, neuropathy, CAD, MI 2006, Rt toe amputatlons x 2, Lt finger partial amputation, bil knee scopes x 2, CVA around 2018 with R sided weakness  PAIN:  Are you having pain? Yes: NPRS  scale: 2/10 Pain location: R LE Pain description: Dull/achy Aggravating factors: unknown; gets this way intermittently Relieving factors: oxycodone   PRECAUTIONS: Fall  PATIENT GOALS Get stronger and be able to get up off the floor.   OBJECTIVE:  Todays Treatment: 03/25/22 L5 x 5 min for warm up  Standing: hip abd x 20 red TB  Hip ext red TB x 20  Marches red TB x 20  Heel raises x 20  Mini squats holding 5# wt  Step ups 6'' step 1 UE support x 10 bilat forward and laterally  Sit to stand from elevated mat table working on standing without UE assist  Standing on foam: Head turns up/down x 10 Head turns laterally x 10 Narrow BOS x 30 sec Semi-tandem stance 2 x 30 sec  Floor transfer with supervision/cues for technique Gait with obstacle negotiation with close supervision with SPC, CGA without SPC for stepping over objects and onto curb step simulation     03/22/22 THEREX: Nu step warm up L5 x 5 min UEs / LEs Standing Heel/toe raises 2x10 Hip abd yellow tband 2x10 Hip ext yellow tband 2x10 Marches 2x10 Sit<>stand x10 UE support into standing, no UE support upon sitting  NMR: Forward step over 4# weight 2x10 Side step over 4# weight 2x10 Standing on foam: Head turns 2x 10 CGA Head nods 2x 10 CGA    PATIENT EDUCATION:  Education details: POC reviewed, HEP  Person educated: Patient Education method: Consulting civil engineer, Demonstration, Verbal cues, and Handouts Education comprehension: verbalized understanding and returned demonstration   HOME EXERCISE PROGRAM: Access Code: [email or web address] URL: [email or web address] Date: 03/16/2022 Prepared by: Rudell Cobb  Exercises - Supine Bridge  - 2 x daily - 7 x weekly - 1-3 sets - 10 reps - Sit to Stand with Armchair  - 2-3 x daily - 7 x weekly - 1 sets - 10 reps - Standing Hip Abduction with Counter Support  - 1 x daily - 7 x weekly - 1-3 sets - 10 reps - Standing March with Counter Support  - 1 x daily - 7  x weekly - 1-3 sets - 10 reps - Standing Hip Extension with Counter Support  - 1 x daily - 7 x weekly - 1-3 sets - 10 reps - Heel Raises with Counter Support  - 2 x daily - 7 x weekly - 1 sets - 10 reps - Standing Gastroc Stretch  - 2 x daily - 7 x weekly - 1 sets - 2 reps - 30 seconds hold  ASSESSMENT:  CLINICAL IMPRESSION: Pt requires to be limited by decreased LE strength, he requires HHA for stepping up curb step with and without cane. He is improving balance on compliant surfaces and was able to perform floor transfer without assistance   GOALS: Goals reviewed with patient? Yes  SHORT TERM GOALS: Target date: 03/24/2022 (Remove Blue Hyperlink)  Patient will be independent with initial HEP. Baseline:  Goal status: INITIAL  LONG TERM  GOALS: Target date: 04/21/2022 (Remove Blue Hyperlink)  Patient will be independent with advanced/ongoing HEP to improve outcomes and carryover.  Baseline:  Goal status: INITIAL  2.  Patient will report at least 50% improvement in mobility to improve QOL. Baseline:  Goal status: INITIAL  3.  Patient will demonstrate improved functional LE strength as demonstrated by a 5x sit to stand in <= 11.4 seconds. Baseline:  Goal status: INITIAL  4.  Patient will be able to complete a floor to stand transfer with or without UE support to improve function at home. Baseline:  Goal status: INITIAL  5.  Patient will demo improved BERG score from  45 to 50 to demonstrate improved functional ability. Baseline: 45 Goal status: INITIAL  6.  Patient will demonstrate at least 19/24 on DGI to decrease risk of falls. Baseline: 17 Goal status: INITIAL      PLAN: PT FREQUENCY: 2x/week  PT DURATION: 6 weeks  PLANNED INTERVENTIONS: Therapeutic exercises, Therapeutic activity, Neuromuscular re-education, Balance training, Gait training, Patient/Family education, Self Care, Stair training, Aquatic Therapy, Dry Needling, Cryotherapy, Moist heat, and Manual  therapy  PLAN FOR NEXT SESSION: compliant surface standing, work on high level balance, floor to stand transfers, sit to stand, squats/lunges   Renel Ende, PT, DPT 03/25/2022, 11:04 AM

## 2022-03-29 ENCOUNTER — Encounter: Payer: Self-pay | Admitting: Physical Therapy

## 2022-03-29 ENCOUNTER — Ambulatory Visit: Payer: Medicare HMO | Admitting: Physical Therapy

## 2022-03-29 DIAGNOSIS — M6281 Muscle weakness (generalized): Secondary | ICD-10-CM

## 2022-03-29 DIAGNOSIS — R2681 Unsteadiness on feet: Secondary | ICD-10-CM

## 2022-03-29 DIAGNOSIS — M1990 Unspecified osteoarthritis, unspecified site: Secondary | ICD-10-CM | POA: Diagnosis not present

## 2022-03-29 NOTE — Therapy (Signed)
OUTPATIENT PHYSICAL THERAPY TREATMENT  Patient Name: Joseph Grimes MRN: [id number] DOB:[birth year], 61 y.o., male Today's Date: 03/29/2022   PT End of Session - 03/29/22 1014     Visit Number 6    Number of Visits 12    Date for PT Re-Evaluation 04/21/22    Authorization - Visit Number 6    Progress Note Due on Visit 10    PT Start Time 1014    PT Stop Time 1055    PT Time Calculation (min) 41 min    Activity Tolerance Patient tolerated treatment well    Behavior During Therapy [facility] for tasks assessed/performed               Past Medical History:  Diagnosis Date   CKD (chronic kidney disease) stage 3, GFR 30-59 ml/min ([place]) 10/11/2018   Coronary artery disease 10/11/2018   Diabetes ([place])    Diabetes mellitus type 2, controlled, with complications ([place]) 01/07/9508   Essential hypertension 10/11/2018   GERD (gastroesophageal reflux disease)    Heart attack ([place]) 2006   stent placed in right coronary artery   High cholesterol    History of anemia 10/11/2018   History of low potassium    Low hemoglobin    Mixed hyperlipidemia 10/11/2018   Peripheral vascular disease ([place]) 10/11/2018   Stroke ([place])    Tobacco dependence 10/11/2018   Past Surgical History:  Procedure Laterality Date   AMPUTATION Left 07/19/2019   Procedure: LEFT PARTIAL THUMB AMPUTATION;  Surgeon: Leandrew Koyanagi, MD;  Location: [facility];  Service: Orthopedics;  Laterality: Left;  with digital block   CARDIAC CATHETERIZATION  2006   04/18/18: no stents, stent in '06   FEMORAL-POPLITEAL BYPASS GRAFT Right 05/12/2017   HAND SURGERY     KNEE ARTHROSCOPY     bilat   TOE AMPUTATION     x2   TONSILLECTOMY     Patient Active Problem List   Diagnosis Date Noted   Abnormal renal function 03/02/2022   Leukocytosis 03/02/2022   Chronic, continuous use of opioids 03/02/2022   Pilonidal cyst 11/25/2019   AKI (acute kidney injury) ([place]) 11/03/2019   Hyponatremia 11/03/2019   Sepsis ([place]) 11/03/2019    Weakness 11/03/2019   Osteomyelitis of finger of left hand ([place])    Vitamin D deficiency 12/28/2018   Erectile dysfunction 12/28/2018   PVC (premature ventricular contraction) 12/27/2018   Essential hypertension 10/11/2018   Coronary artery disease 10/11/2018   Peripheral vascular disease ([place]) 10/11/2018   Mixed hyperlipidemia 10/11/2018   Osteoarthritis 10/11/2018   CKD (chronic kidney disease) stage 4, GFR 15-29 ml/min (HCC) 10/11/2018   History of anemia 10/11/2018   Other chronic pain 06/08/2018   Vaccine refused by patient 06/07/2018   Abnormal stress test 04/10/2018   Non-traumatic rhabdomyolysis 02/01/2018   Hypokalemia 02/01/2018   Hypophosphatemia 02/01/2018   Perianal abscess 10/16/2017   Nicotine dependence with current use 10/12/2017   Perirectal abscess 10/12/2017   Drug-induced constipation 10/12/2017   GI bleed 09/15/2017   Elevated troponin 06/15/2017   Chronic diastolic heart failure ([place]) 06/15/2017   Normocytic anemia 06/15/2017   Type 2 diabetes mellitus with diabetic neuropathy, with long-term current use of insulin ([place]) 04/24/2017   History of cerebrovascular accident 09/20/2016   Bone lesion 09/29/2014   Finger pain, right 09/29/2014   S/P coronary artery stent placement 12/20/2012   Old myocardial infarction 10/27/2010    PCP: Samuel Bouche, NP  REFERRING PROVIDER: Samuel Bouche, NP  REFERRING  DIAG: M19.90 (ICD-10-CM) - Osteoarthritis, unspecified osteoarthritis type, unspecified site  THERAPY DIAG:  Muscle weakness (generalized)  Unsteadiness on feet  Rationale for Evaluation and Treatment Rehabilitation  ONSET DATE: 2018 after stroke  SUBJECTIVE:   SUBJECTIVE STATEMENT: Pt states he had another bad night's sleep. Feeling tired today.    PERTINENT HISTORY: DM, HTN, PVD, neuropathy, CAD, MI 2006, Rt toe amputatlons x 2, Lt finger partial amputation, bil knee scopes x 2, CVA around 2018 with R sided weakness  PAIN:  Are you having pain?  No   PRECAUTIONS: Fall  PATIENT GOALS Get stronger and be able to get up off the floor.   OBJECTIVE:  Todays Treatment:  03/29/22 Nu step L6x 5 min for warm up  Standing: Heel/toe raises 2x10 (first with and then without counter support) Knee flexion 2x10 (first with and then without counter support) Hip abd 2x10 red TB Hip ext 2x10 red TB Marches 2x10 red TB Sit<>stand without UE assist 2x5 SLS with intermittent UE support (2x10 hand lifts off counter)  On foam:  Feet apart and then together, EO, Head turns up/down x 10 Feet apart and then together,  EO, Head turns laterally x 10 Feet apart EC 2x20 sec Semi tandem 2x30 sec   03/25/22 L5 x 5 min for warm up  Standing: hip abd x 20 red TB  Hip ext red TB x 20  Marches red TB x 20  Heel raises x 20  Mini squats holding 5# wt  Step ups 6'' step 1 UE support x 10 bilat forward and laterally  Sit to stand from elevated mat table working on standing without UE assist  Standing on foam: Head turns up/down x 10 Head turns laterally x 10 Narrow BOS x 30 sec Semi-tandem stance 2 x 30 sec  Floor transfer with supervision/cues for technique Gait with obstacle negotiation with close supervision with SPC, CGA without SPC for stepping over objects and onto curb step simulation    PATIENT EDUCATION:  Education details: POC reviewed, HEP  Person educated: Patient Education method: Consulting civil engineer, Media planner, Verbal cues, and Handouts Education comprehension: verbalized understanding and returned demonstration   HOME EXERCISE PROGRAM: Access Code: [email or web address] URL: [email or web address] Date: 03/16/2022 Prepared by: Rudell Cobb  Exercises - Supine Bridge  - 2 x daily - 7 x weekly - 1-3 sets - 10 reps - Sit to Stand with Armchair  - 2-3 x daily - 7 x weekly - 1 sets - 10 reps - Standing Hip Abduction with Counter Support  - 1 x daily - 7 x weekly - 1-3 sets - 10 reps - Standing March with Counter Support   - 1 x daily - 7 x weekly - 1-3 sets - 10 reps - Standing Hip Extension with Counter Support  - 1 x daily - 7 x weekly - 1-3 sets - 10 reps - Heel Raises with Counter Support  - 2 x daily - 7 x weekly - 1 sets - 10 reps - Standing Gastroc Stretch  - 2 x daily - 7 x weekly - 1 sets - 2 reps - 30 seconds hold  ASSESSMENT:  CLINICAL IMPRESSION: Session focused primarily on strengthening this session. Initiated some items from [place]. Pt is limited by fatigue.    GOALS: Goals reviewed with patient? Yes  SHORT TERM GOALS: Target date: 03/24/2022 (Remove Blue Hyperlink)  Patient will be independent with initial HEP. Baseline:  Goal status: MET  LONG TERM GOALS: Target date: 04/21/2022 (Remove Blue Hyperlink)  Patient  will be independent with advanced/ongoing HEP to improve outcomes and carryover.  Baseline:  Goal status: IN PROGRESS  2.  Patient will report at least 50% improvement in mobility to improve QOL. Baseline:  Goal status: IN PROGRESS  3.  Patient will demonstrate improved functional LE strength as demonstrated by a 5x sit to stand in <= 11.4 seconds. Baseline:  Goal status: IN PROGRESS  4.  Patient will be able to complete a floor to stand transfer with or without UE support to improve function at home. Baseline:  Goal status: IN PROGRESS  5.  Patient will demo improved BERG score from  45 to 50 to demonstrate improved functional ability. Baseline: 45 Goal status: IN PROGRESS  6.  Patient will demonstrate at least 19/24 on DGI to decrease risk of falls. Baseline: 17 Goal status: IN PROGRESS      PLAN: PT FREQUENCY: 2x/week  PT DURATION: 6 weeks  PLANNED INTERVENTIONS: Therapeutic exercises, Therapeutic activity, Neuromuscular re-education, Balance training, Gait training, Patient/Family education, Self Care, Stair training, Aquatic Therapy, Dry Needling, Cryotherapy, Moist heat, and Manual therapy  PLAN FOR NEXT SESSION: compliant surface standing, work on  high level balance, floor to stand transfers, sit to stand, squats/lunges   [place] April Ma L Suesan Mohrmann, PT, DPT 03/29/2022, 10:14 AM

## 2022-03-30 ENCOUNTER — Ambulatory Visit: Payer: Medicare HMO | Admitting: Pharmacist

## 2022-03-30 DIAGNOSIS — E1140 Type 2 diabetes mellitus with diabetic neuropathy, unspecified: Secondary | ICD-10-CM

## 2022-03-30 DIAGNOSIS — E782 Mixed hyperlipidemia: Secondary | ICD-10-CM

## 2022-03-30 DIAGNOSIS — I10 Essential (primary) hypertension: Secondary | ICD-10-CM

## 2022-03-30 NOTE — Progress Notes (Signed)
Chronic Care Management Pharmacy Note  03/30/2022 Name:  THEO KRUMHOLZ MRN:  [id number] DOB:  Oct 08, 1960  Summary: addressed HTN, HLD, and primarily DM.  We began ozempic 0.29m weekly using sample, arranged patient assistance, but patient reported uncontrollable diarrhea and discontinued medication after 3 weeks of injecting 0.268mdosage.   BG 70-90s fasting, and 100-130s postprandial. BG 148 is "highest he has seen."  One episode of hypoglycemia, BG 68. Aysmptomatic, self-resolving.  Still taking toujeo 32-34 units when eating lighter meals, and 36 units when he expects a heavier meal intake.   July PCP visit completed labwork that reveals declining renal function.  Recommendations/Changes made from today's visit:  - Provided patient assistance paperwork for farxiga at front desk to pick up, fill out patient section, and return. - Counseled patient on farxiga side effects and benefits. - Toujeo switch to tresiba, novonordisk application in progress.   Plan: f/u with pharmacist in 1 month  Subjective: LeGIANNIS CORPUZs an [age].o. year old male who is a primary patient of JeSamuel BoucheNP.  The CCM team was consulted for assistance with disease management and care coordination needs.    Engaged with patient by telephone for follow up visit in response to provider referral for pharmacy case management and/or care coordination services.   Consent to Services:  The patient was given information about Chronic Care Management services, agreed to services, and gave verbal consent prior to initiation of services.  Please see initial visit note for detailed documentation.   Patient Care Team: JeSamuel BoucheNP as PCP - General (Nurse Practitioner) KlDarius BumpRPSsm [facility] Pharmacist (Pharmacist)  Objective:  Lab Results  Component Value Date   CREATININE 2.97 (H) 03/03/2022   CREATININE 2.24 (H) 12/02/2021   CREATININE 1.94 (H) 12/23/2020    Lab Results  Component Value  Date   HGBA1C 8.1 (A) 03/03/2022   Last diabetic Eye exam:  Lab Results  Component Value Date/Time   HMDIABEYEEXA Retinopathy (A) 06/16/2021 12:00 AM       Component Value Date/Time   CHOL 101 12/02/2021 0000   TRIG 75 12/02/2021 0000   HDL 48 12/02/2021 0000   CHOLHDL 2.1 12/02/2021 0000   LDLCALC 37 12/02/2021 0000       Latest Ref Rng & Units 12/02/2021   12:00 AM 12/23/2020   12:18 PM 11/26/2019   11:14 AM  Hepatic Function  Total Protein 6.1 - 8.1 g/dL 6.1  6.1  5.9   AST 10 - 35 U/L _0 ALT 9 - 46 U/L _1 Total Bilirubin 0.2 - 1.2 mg/dL 0.4  0.3  0.4     Lab Results  Component Value Date/Time   TSH 1.25 11/26/2019 11:14 AM       Latest Ref Rng & Units 03/03/2022   12:00 AM 12/02/2021   12:00 AM 11/26/2019   11:14 AM  CBC  WBC 3.8 - 10.8 Thousand/uL 9.8  11.8  11.6   Hemoglobin 13.2 - 17.1 g/dL 13.9  13.6  8.5   Hematocrit 38.5 - 50.0 % 40.9  41.0  26.7   Platelets 140 - 400 Thousand/uL 214  229  397     Lab Results  Component Value Date/Time   VD25OH 8 (L) 12/27/2018 11:56 AM     Social History   Tobacco Use  Smoking Status Every Day   Packs/day: 0.50   Types: Cigarettes  Smokeless Tobacco Never  Tobacco Comments   refused   BP Readings from Last 3 Encounters:  03/03/22 121/73  01/12/22 (!) 113/54  12/02/21 (!) 166/75   Pulse Readings from Last 3 Encounters:  03/03/22 93  01/12/22 69  12/02/21 84   Wt Readings from Last 3 Encounters:  03/03/22 155 lb 0.6 oz (70.3 kg)  12/02/21 153 lb 0.6 oz (69.4 kg)  10/06/21 153 lb 1.9 oz (69.5 kg)    Assessment: Review of patient past medical history, allergies, medications, health status, including review of consultants reports, laboratory and other test data, was performed as part of comprehensive evaluation and provision of chronic care management services.   SDOH:  (Social Determinants of Health) assessments and interventions performed:    CCM Care Plan  No Known  Allergies  Medications Reviewed Today     Reviewed by Bubba Hales April Ma L, PT (Physical Therapist) on 03/29/22 at 1014  Med List Status: <None>   Medication Order Taking? Sig Documenting Provider Last Dose Status Informant  Accu-Chek Softclix Lancets lancets [id number] No  [provider] Taking Active   aspirin 81 MG tablet [id number] No Take 1 tablet (81 mg total) by mouth daily. Emeterio Reeve, DO Taking Active   blood glucose meter kit and supplies [id number] No Dispense based on patient and insurance preference. Use up to four times daily as directed. (FOR ICD-10 E10.9, E11.9). Emeterio Reeve, DO Taking Active   cilostazol (PLETAL) 100 MG tablet [id number] No TAKE 1 TABLET TWICE DAILY Samuel Bouche, NP Taking Active   FEROSUL 325 (65 Fe) MG tablet [id number] No TAKE 1 TABLET TWICE DAILY WITH MEALS Emeterio Reeve, DO Taking Active   furosemide (LASIX) 20 MG tablet [id number] No TAKE 1 TABLET EVERY DAY Jessup, Joy, NP Taking Active   insulin glargine, 2 Unit Dial, (TOUJEO MAX SOLOSTAR) 300 UNIT/ML Solostar Pen [id number] No Inject 16 Units into the skin at bedtime. Increase by 4 units every 4 days until fasting (morning) blood sugar is <150, then stay at that dose.  Patient taking differently: Inject 35 Units into the skin at bedtime. Increase by 4 units every 4 days until fasting (morning) blood sugar is <150, then stay at that dose. (Taking 35 units daily as of 06/23/21)   Emeterio Reeve, DO Taking Active            Med Note Dorene Ar Mar 24, 2021 10:11 AM) Taking 42 units as of 03/24/21   Insulin Pen Needle 29G X 12.7MM MISC [id number] No 100 each by Does not apply route daily. Please dispense qty, brand, appropriate pen sizing for toujeo solostar Samuel Bouche, NP Taking Active   MODERNA COVID-19 BIVAL BOOSTER 50 MCG/0.5ML injection [id number] No  [provider] Taking Active   Oxycodone HCl 10 MG TABS [id number] No Take 0.5-1 tablets (5-10 mg total)  by mouth every 6 (six) hours as needed. 10 tablets must last 30 days Samuel Bouche, NP Taking Active   Oxycodone HCl 10 MG TABS [id number] No Take 0.5-1 tablets (5-10 mg total) by mouth every 6 (six) hours as needed. 10 tablets must last 30 days Samuel Bouche, NP Taking Active   Oxycodone HCl 10 MG TABS [id number] No Take 0.5-1 tablets (5-10 mg total) by mouth every 6 (six) hours as needed. 10 tablets must last 30 days Samuel Bouche, NP Taking Active   pantoprazole (PROTONIX) 40 MG tablet [id number] No TAKE 1 TABLET EVERY DAY Jessup, Joy, NP Taking Active   potassium chloride (KLOR-CON M) 10  MEQ tablet [id number] No TAKE 1 TABLET EVERY DAY Jessup, Joy, NP Taking Active   rosuvastatin (CRESTOR) 40 MG tablet [id number] No TAKE 1 TABLET (40 MG TOTAL) BY MOUTH DAILY. OFFICE VISIT REQUIRED PRIOR TO ANY FURTHER REFILLS Samuel Bouche, NP Taking Active   TRUE METRIX BLOOD GLUCOSE TEST test strip [id number] No TEST UP TO FOUR TIMES DAILY AS DIRECTED Samuel Bouche, NP Taking Active   valsartan (DIOVAN) 40 MG tablet [id number] No Take 1 tablet (40 mg total) by mouth daily. Samuel Bouche, NP Taking Active             Patient Active Problem List   Diagnosis Date Noted   Abnormal renal function 03/02/2022   Leukocytosis 03/02/2022   Chronic, continuous use of opioids 03/02/2022   Pilonidal cyst 11/25/2019   AKI (acute kidney injury) ([place]) 11/03/2019   Hyponatremia 11/03/2019   Sepsis ([place]) 11/03/2019   Weakness 11/03/2019   Osteomyelitis of finger of left hand ([place])    Vitamin D deficiency 12/28/2018   Erectile dysfunction 12/28/2018   PVC (premature ventricular contraction) 12/27/2018   Essential hypertension 10/11/2018   Coronary artery disease 10/11/2018   Peripheral vascular disease ([place]) 10/11/2018   Mixed hyperlipidemia 10/11/2018   Osteoarthritis 10/11/2018   CKD (chronic kidney disease) stage 4, GFR 15-29 ml/min (HCC) 10/11/2018   History of anemia 10/11/2018   Other chronic pain 06/08/2018   Vaccine  refused by patient 06/07/2018   Abnormal stress test 04/10/2018   Non-traumatic rhabdomyolysis 02/01/2018   Hypokalemia 02/01/2018   Hypophosphatemia 02/01/2018   Perianal abscess 10/16/2017   Nicotine dependence with current use 10/12/2017   Perirectal abscess 10/12/2017   Drug-induced constipation 10/12/2017   GI bleed 09/15/2017   Elevated troponin 06/15/2017   Chronic diastolic heart failure ([place]) 06/15/2017   Normocytic anemia 06/15/2017   Type 2 diabetes mellitus with diabetic neuropathy, with long-term current use of insulin ([place]) 04/24/2017   History of cerebrovascular accident 09/20/2016   Bone lesion 09/29/2014   Finger pain, right 09/29/2014   S/P coronary artery stent placement 12/20/2012   Old myocardial infarction 10/27/2010    Immunization History  Administered Date(s) Administered   Janssen (J&J) SARS-COV-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 12/04/2019   PFIZER(Purple Top)SARS-COV-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 06/10/2020    Conditions to be addressed/monitored: HTN, HLD, and DMII  Care Plan : Medication Management  Updates made by Darius Bump, [place] since 03/30/2022 12:00 AM     Problem: Diabetes, HTN, HLD   Priority: High     Long-Range Goal: Disease Progression Prevention   Start Date: 12/23/2020  Recent Progress: On track  Priority: High  Note:   Current Barriers:  Unable to independently afford treatment regimen Unable to achieve control of diabetes   Pharmacist Clinical Goal(s):  Over the next 30 days, patient will verbalize ability to afford treatment regimen achieve adherence to monitoring guidelines and medication adherence to achieve therapeutic efficacy adhere to prescribed medication regimen as evidenced by fill history & verbal confirmation of patient assistance status  through collaboration with PharmD and provider.   Interventions: 1:1 collaboration with Samuel Bouche, NP regarding development and update of comprehensive plan of care as evidenced by provider attestation and  co-signature Inter-disciplinary care team collaboration (see longitudinal plan of care) Comprehensive medication review performed; medication list updated in electronic medical record  Diabetes:  Uncontrolled: toujeo 36 units daily, tried ozempic but did not tolerate due to diarrhea  Previous glucose readings:  90-100s  Denies hypoglycemic/hyperglycemic symptoms  Previously discussed current meal patterns: breakfast: eggs, hashbrown,  sausage or bacon, corn beef hash; lunch: skips lunch; dinner: varies - taco, burrito, spaghetti, cheeseburgers, pork sausage; drinks: Mt Dew (2 per day)  Current exercise: limited by residual stroke deficits but does shoulder rotations  Recommended goal of continuing green vegetable on plate 3x per week, NOW ADDING the goal of 2 MtDews per day, maximum, aiming for 1 per day. ,      Continue current regimen, begin [place] for renal benefit  since GLP1 not tolerated. Pending novonordisk patient assistance for tresiba. Beginning patient assistance for farxiga.  Hypertension:  Controlled; current treatment: furosemide 52m daily, valsartan 466mdaily  Current home readings: not checking at home  Denies hypotensive/hypertensive symptoms  Recommended continue current regimen, patient will bring in his BP monitor next visit to learn how to use it.    Hyperlipidemia:  Controlled, current treatment: Rosuvastatin 4082m  Medications previously tried: N/A  Educated on multiple benefits of statin therapy Recommended continue current regimen  Patient Goals/Self-Care Activities Over the next 30 days, patient will:   take medications as prescribed, check glucose 2x day (morning and afternoon), document, and provide at future appointments, collaborate with provider on medication access solutions, and engage in dietary modifications by choosing a green vegetable at least 3x per week and aim to decrease to 1-2 Mt Dew per day at most  Follow Up Plan: Telephone follow up  appointment with care management team member scheduled for:  1 month           Medication Assistance: Patient assistance for toujeo ([employer] PAP) approved 12/28/20, medication delivered to primary care office 01/06/21, 90DS will continue to be delivered. Recertification of application for [id number] can be completed July 22, 2021.  [id number] [place]: [phone]: [phone] [place]one: [phone]: [phone] pill box? Yes Pt endorses 100% compliance  Follow Up:  Patient agrees to Care Plan and Follow-up.  Plan: Telephone follow up appointment with care management team member scheduled for:  1 month  KeeLarinda ButteryharmD Clinical Pharmacist [phone]

## 2022-03-30 NOTE — Patient Instructions (Signed)
Visit Information  Thank you for taking time to visit with me today. Please don't hesitate to contact me if I can be of assistance to you before our next scheduled telephone appointment.  Following are the goals we discussed today:   Patient Goals/Self-Care Activities Over the next 30 days, patient will:   take medications as prescribed, check glucose 2x day (morning and afternoon), document, and provide at future appointments, collaborate with provider on medication access solutions, and engage in dietary modifications by choosing a green vegetable at least 3x per week and aim to decrease to 1-2 Mt Dew per day at most  Follow Up Plan: Telephone follow up appointment with care management team member scheduled for:  1 month  Please call the care guide team at [phone] if you need to cancel or reschedule your appointment.   Patient verbalizes understanding of instructions and care plan provided today and agrees to view in [place]. Active MyChart status and patient understanding of how to access instructions and care plan via MyChart confirmed with patient.     Joseph Grimes

## 2022-04-01 ENCOUNTER — Ambulatory Visit: Payer: Medicare HMO | Admitting: Physical Therapy

## 2022-04-01 ENCOUNTER — Encounter: Payer: Self-pay | Admitting: Physical Therapy

## 2022-04-01 DIAGNOSIS — R2681 Unsteadiness on feet: Secondary | ICD-10-CM | POA: Diagnosis not present

## 2022-04-01 DIAGNOSIS — M6281 Muscle weakness (generalized): Secondary | ICD-10-CM

## 2022-04-01 DIAGNOSIS — M1990 Unspecified osteoarthritis, unspecified site: Secondary | ICD-10-CM | POA: Diagnosis not present

## 2022-04-01 NOTE — Therapy (Addendum)
OUTPATIENT PHYSICAL THERAPY TREATMENT AND DISCHARGE  Patient Name: Joseph Grimes MRN: [id number] DOB:Oct 03, 1960, 61 y.o., male Today's Date: 04/01/2022   PT End of Session - 04/01/22 1057     Visit Number 7    Number of Visits 12    Date for PT Re-Evaluation 04/21/22    Authorization - Visit Number 7    Progress Note Due on Visit 10    PT Start Time 3903    PT Stop Time 1058    PT Time Calculation (min) 43 min    Activity Tolerance Patient tolerated treatment well    Behavior During Therapy [facility] for tasks assessed/performed                Past Medical History:  Diagnosis Date   CKD (chronic kidney disease) stage 3, GFR 30-59 ml/min ([place]) 10/11/2018   Coronary artery disease 10/11/2018   Diabetes ([place])    Diabetes mellitus type 2, controlled, with complications ([place]) 0/0/9233   Essential hypertension 10/11/2018   GERD (gastroesophageal reflux disease)    Heart attack ([place]) 2006   stent placed in right coronary artery   High cholesterol    History of anemia 10/11/2018   History of low potassium    Low hemoglobin    Mixed hyperlipidemia 10/11/2018   Peripheral vascular disease ([place]) 10/11/2018   Stroke ([place])    Tobacco dependence 10/11/2018   Past Surgical History:  Procedure Laterality Date   AMPUTATION Left 07/19/2019   Procedure: LEFT PARTIAL THUMB AMPUTATION;  Surgeon: Leandrew Koyanagi, MD;  Location: [facility];  Service: Orthopedics;  Laterality: Left;  with digital block   CARDIAC CATHETERIZATION  2006   04/18/18: no stents, stent in '06   FEMORAL-POPLITEAL BYPASS GRAFT Right 05/12/2017   HAND SURGERY     KNEE ARTHROSCOPY     bilat   TOE AMPUTATION     x2   TONSILLECTOMY     Patient Active Problem List   Diagnosis Date Noted   Abnormal renal function 03/02/2022   Leukocytosis 03/02/2022   Chronic, continuous use of opioids 03/02/2022   Pilonidal cyst 11/25/2019   AKI (acute kidney injury) ([place]) 11/03/2019   Hyponatremia 11/03/2019   Sepsis  ([place]) 11/03/2019   Weakness 11/03/2019   Osteomyelitis of finger of left hand ([place])    Vitamin D deficiency 12/28/2018   Erectile dysfunction 12/28/2018   PVC (premature ventricular contraction) 12/27/2018   Essential hypertension 10/11/2018   Coronary artery disease 10/11/2018   Peripheral vascular disease ([place]) 10/11/2018   Mixed hyperlipidemia 10/11/2018   Osteoarthritis 10/11/2018   CKD (chronic kidney disease) stage 4, GFR 15-29 ml/min (HCC) 10/11/2018   History of anemia 10/11/2018   Other chronic pain 06/08/2018   Vaccine refused by patient 06/07/2018   Abnormal stress test 04/10/2018   Non-traumatic rhabdomyolysis 02/01/2018   Hypokalemia 02/01/2018   Hypophosphatemia 02/01/2018   Perianal abscess 10/16/2017   Nicotine dependence with current use 10/12/2017   Perirectal abscess 10/12/2017   Drug-induced constipation 10/12/2017   GI bleed 09/15/2017   Elevated troponin 06/15/2017   Chronic diastolic heart failure ([place]) 06/15/2017   Normocytic anemia 06/15/2017   Type 2 diabetes mellitus with diabetic neuropathy, with long-term current use of insulin ([place]) 04/24/2017   History of cerebrovascular accident 09/20/2016   Bone lesion 09/29/2014   Finger pain, right 09/29/2014   S/P coronary artery stent placement 12/20/2012   Old myocardial infarction 10/27/2010    PCP: Samuel Bouche, NP  REFERRING PROVIDER: Samuel Bouche,  NP  REFERRING DIAG: M19.90 (ICD-10-CM) - Osteoarthritis, unspecified osteoarthritis type, unspecified site  THERAPY DIAG:  Muscle weakness (generalized)  Unsteadiness on feet  Rationale for Evaluation and Treatment Rehabilitation  ONSET DATE: 2018 after stroke  SUBJECTIVE:   SUBJECTIVE STATEMENT: Pt states he had another bad night's sleep. Feeling tired today.    PERTINENT HISTORY: DM, HTN, PVD, neuropathy, CAD, MI 2006, Rt toe amputatlons x 2, Lt finger partial amputation, bil knee scopes x 2, CVA around 2018 with R sided weakness  PAIN:   Are you having pain? No   PRECAUTIONS: Fall  PATIENT GOALS Get stronger and be able to get up off the floor.   OBJECTIVE:  5x STS 21.08 (04/01/22)  BERG BALANCE TEST (04/01/22) Sitting to Standing: 3.      Stands independently using hands Standing Unsupported: 4.      Stands safely for 2 minutes Sitting Unsupported: 4.     Sits for 2 minutes independently Standing to Sitting: 3.     Controls descent with hands  Transfers: 3.     Transfers safely definite use of hands Standing with eyes closed: 3.     Stands 10 seconds with supervision Standing with feet together: 3.     Stands for 1 minute with supervision Reaching forward with outstretched arm: 3.     Reaches forward 5 inches Retrieving object from the floor: 4.      Able to pick up easily and safely Turning to look behind: 4.     Looks behind from both sides and weight shifts well Turning 360 degrees: 2.     Able to turn slowly, but safely Place alternate foot on stool: 1.     Completes >2 steps with minimal assist Standing with one foot in front: 2.     Independent small step for 30 seconds Standing on one foot: 2.     Holds >/=3 seconds  Total Score: 41/56   Todays Treatment: 04/01/22 Nustep L6 x 5 min warm up  Step up with tapping step above 6'' step 1 UE support x 10 bilat Heel/toe raise 2x 10 Hip abd red TB 2 x 10 Hip ext red TB 2 x 10 Marches red TB 2 x 10 STS without UEs from elevated mat, lowering after each set of 3 reps  Walking on foam mat with squatting to pick up cones on one side and carry them to the other side and put on ground    03/29/22 Nu step L6x 5 min for warm up  Standing: Heel/toe raises 2x10 (first with and then without counter support) Knee flexion 2x10 (first with and then without counter support) Hip abd 2x10 red TB Hip ext 2x10 red TB Marches 2x10 red TB Sit<>stand without UE assist 2x5 SLS with intermittent UE support (2x10 hand lifts off counter)  On foam:  Feet apart and then  together, EO, Head turns up/down x 10 Feet apart and then together,  EO, Head turns laterally x 10 Feet apart EC 2x20 sec Semi tandem 2x30 sec    PATIENT EDUCATION:  Education details: POC reviewed, HEP  Person educated: Patient Education method: Consulting civil engineer, Media planner, Verbal cues, and Handouts Education comprehension: verbalized understanding and returned demonstration   HOME EXERCISE PROGRAM: Access Code: [email or web address] URL: [email or web address] Date: 03/16/2022 Prepared by: Rudell Cobb  Exercises - Supine Bridge  - 2 x daily - 7 x weekly - 1-3 sets - 10 reps - Sit to Stand with Armchair  - 2-3 x daily - 7  x weekly - 1 sets - 10 reps - Standing Hip Abduction with Counter Support  - 1 x daily - 7 x weekly - 1-3 sets - 10 reps - Standing March with Counter Support  - 1 x daily - 7 x weekly - 1-3 sets - 10 reps - Standing Hip Extension with Counter Support  - 1 x daily - 7 x weekly - 1-3 sets - 10 reps - Heel Raises with Counter Support  - 2 x daily - 7 x weekly - 1 sets - 10 reps - Standing Gastroc Stretch  - 2 x daily - 7 x weekly - 1 sets - 2 reps - 30 seconds hold  ASSESSMENT:  CLINICAL IMPRESSION: Pt with Berg balance score and 5 x STS unchanged. Pt is however more confident with balance and states he feels more comfortable with balance challenges. He continues to have greatest difficulty with STS without UE support - continued to focus on LE strength   GOALS: Goals reviewed with patient? Yes  SHORT TERM GOALS: Target date: 03/24/2022 (Remove Blue Hyperlink)  Patient will be independent with initial HEP. Baseline:  Goal status: MET  LONG TERM GOALS: Target date: 04/21/2022 (Remove Blue Hyperlink)  Patient will be independent with advanced/ongoing HEP to improve outcomes and carryover.  Baseline:  Goal status: IN PROGRESS  2.  Patient will report at least 50% improvement in mobility to improve QOL. Baseline:  Goal status: IN PROGRESS  3.   Patient will demonstrate improved functional LE strength as demonstrated by a 5x sit to stand in <= 11.4 seconds. Baseline:  Goal status: IN PROGRESS  4.  Patient will be able to complete a floor to stand transfer with or without UE support to improve function at home. Baseline:  Goal status: MET  5.  Patient will demo improved BERG score from  45 to 50 to demonstrate improved functional ability. Baseline: 45 Goal status: IN PROGRESS  6.  Patient will demonstrate at least 19/24 on DGI to decrease risk of falls. Baseline: 17 Goal status: IN PROGRESS      PLAN: PT FREQUENCY: 2x/week  PT DURATION: 6 weeks  PLANNED INTERVENTIONS: Therapeutic exercises, Therapeutic activity, Neuromuscular re-education, Balance training, Gait training, Patient/Family education, Self Care, Stair training, Aquatic Therapy, Dry Needling, Cryotherapy, Moist heat, and Manual therapy  PLAN FOR NEXT SESSION: compliant surface standing, work on high level balance, floor to stand transfers, sit to stand, squats/lunges PHYSICAL THERAPY DISCHARGE SUMMARY  Visits from Start of Care: 7  Current functional level related to goals / functional outcomes: Improving balance and strength   Remaining deficits: See above   Education / Equipment: HEP   Patient agrees to discharge. Patient goals were partially met. Patient is being discharged due to not returning since the last visit.  Isabelle Course, PT,DPT09/26/238:57 AM  Isabelle Course, PT, DPT 04/01/2022, 10:58 AM

## 2022-04-05 ENCOUNTER — Ambulatory Visit: Payer: Medicare HMO | Admitting: Medical-Surgical

## 2022-04-07 DIAGNOSIS — I10 Essential (primary) hypertension: Secondary | ICD-10-CM

## 2022-04-07 DIAGNOSIS — E782 Mixed hyperlipidemia: Secondary | ICD-10-CM | POA: Diagnosis not present

## 2022-04-07 DIAGNOSIS — E1140 Type 2 diabetes mellitus with diabetic neuropathy, unspecified: Secondary | ICD-10-CM

## 2022-04-07 DIAGNOSIS — Z794 Long term (current) use of insulin: Secondary | ICD-10-CM

## 2022-04-15 ENCOUNTER — Ambulatory Visit (INDEPENDENT_AMBULATORY_CARE_PROVIDER_SITE_OTHER): Payer: Medicare HMO | Admitting: Podiatry

## 2022-04-15 ENCOUNTER — Telehealth: Payer: Self-pay | Admitting: Medical-Surgical

## 2022-04-15 ENCOUNTER — Encounter: Payer: Self-pay | Admitting: Podiatry

## 2022-04-15 DIAGNOSIS — I739 Peripheral vascular disease, unspecified: Secondary | ICD-10-CM

## 2022-04-15 DIAGNOSIS — E1140 Type 2 diabetes mellitus with diabetic neuropathy, unspecified: Secondary | ICD-10-CM

## 2022-04-15 DIAGNOSIS — E1165 Type 2 diabetes mellitus with hyperglycemia: Secondary | ICD-10-CM

## 2022-04-15 DIAGNOSIS — M79675 Pain in left toe(s): Secondary | ICD-10-CM | POA: Diagnosis not present

## 2022-04-15 DIAGNOSIS — M79674 Pain in right toe(s): Secondary | ICD-10-CM

## 2022-04-15 DIAGNOSIS — B351 Tinea unguium: Secondary | ICD-10-CM

## 2022-04-15 MED ORDER — INSULIN PEN NEEDLE 29G X 12.7MM MISC: 100.0000 | Freq: Every day | 2 refills | Status: DC

## 2022-04-15 NOTE — Telephone Encounter (Signed)
Refill of insulin pen needles sent to [facility] as requested.  ___________________________________________ Clearnce Sorrel, DNP, APRN, FNP-BC Primary Care and [place]

## 2022-04-15 NOTE — Progress Notes (Signed)
  Subjective:  Patient ID: Joseph Grimes, male    DOB: 07/08/1961,   MRN: [id number]  Chief Complaint  Patient presents with   Nail Problem    Thick painful toenails, 3 month follow up    Diabetes      61 y.o. male presents for follow-up  routine foot care. Relates painful thickened and elongated toenails he is unable to trim himself. As well as callus on his left heel. Third digit wound doing well and has continued to stay healed.  Denies pain or any issues. Denies drainage.  Last A1c 8.6 on 12/02/21  was  Denies any other pedal complaints. Denies n/v/f/c.   PCP: Samuel Bouche NP   Past Medical History:  Diagnosis Date   CKD (chronic kidney disease) stage 3, GFR 30-59 ml/min ([place]) 10/11/2018   Coronary artery disease 10/11/2018   Diabetes ([place])    Diabetes mellitus type 2, controlled, with complications ([place]) 08/08/5724   Essential hypertension 10/11/2018   GERD (gastroesophageal reflux disease)    Heart attack ([place]) 2006   stent placed in right coronary artery   High cholesterol    History of anemia 10/11/2018   History of low potassium    Low hemoglobin    Mixed hyperlipidemia 10/11/2018   Peripheral vascular disease ([place]) 10/11/2018   Stroke ([place])    Tobacco dependence 10/11/2018    Objective:  Physical Exam: Vascular: DP/PT pulses 2/4 bilateral. CFT <3 seconds. Absent hair growth and xerosis noted to bilateral lower extremity., . No edema.  Skin. No lacerations or abrasions bilateral feet. Plantar third digit wound healed mild callus overlying. Hyperkeratotic tissue noted to posterior left heel. Nails 1-5 on left and 145 on right are thickened elongated and discolored with subungual debris.   Musculoskeletal: MMT 5/5 bilateral lower extremities in DF, PF, Inversion and Eversion. Deceased ROM in DF of ankle joint. 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  Neurological: Sensation intact to light touch.   Assessment:   1. Pain due to onychomycosis of toenails of both feet   2.  Poorly controlled type 2 diabetes mellitus with neuropathy ([place])   3. Peripheral vascular disease ([place])           Plan:  Patient was evaluated and treated and all questions answered. Ulcer left third digit healed.   -Discussed and educated patient on diabetic foot care, especially with  regards to the vascular, neurological and musculoskeletal systems.  -Stressed the importance of good glycemic control and the detriment of not  controlling glucose levels in relation to the foot. -Discussed supportive shoes at all times and checking feet regularly.  -Mechanically debrided all nails 1-5 bilateral using sterile nail nipper and filed with dremel without incident  -Answered all patient questions -Patient to return  in 3 months for at risk foot care -Patient advised to call the office if any problems or questions arise in the meantime.    Return in about 3 months (around 07/15/2022) for rfc.   Lorenda Peck, DPM

## 2022-04-15 NOTE — Telephone Encounter (Signed)
Patient came in to request a refill of Ultrathin Pen Needles. Patient was notified of wait time. Joseph Grimes

## 2022-04-25 NOTE — Progress Notes (Signed)
Chronic Care Management Pharmacy Note  04/27/2022 Name:  Joseph Grimes MRN:  [id number] DOB:  [birth year]  Summary:  addressed HTN, HLD, and primarily DM. Patient recently tried ozempic but did not tolerate due to severe diarrhea. July 2023 PCP visit completed labwork that reveals declining renal function, now in process of obtaining farxiga using patient assistance. Patient describes he forgot to pick up the paperwork.  BG 84 this morning, 120s usually.  Patient described a recent episode of hypoglycemia, BG 55, after taking 36 units of toujeo the night before.  He takes 32-24 units of toujeo on normal days, but if larger meal intake, sometimes takes 36 units.   Recommendations/Changes made from today's visit:  - Provided patient assistance paperwork for farxiga at front desk to pick up 04/27/22, he will fill out patient section, and return. - Advised patient to stay on 34 units of toujeo to avoid hypoglycemia  Plan: f/u with pharmacist in 1-2 month  Subjective: Joseph Grimes is an 61 y.o. year old male who is a primary patient of Samuel Bouche, NP.  The CCM team was consulted for assistance with disease management and care coordination needs.    Engaged with patient by telephone for follow up visit in response to provider referral for pharmacy case management and/or care coordination services.   Consent to Services:  The patient was given information about Chronic Care Management services, agreed to services, and gave verbal consent prior to initiation of services.  Please see initial visit note for detailed documentation.   Patient Care Team: Samuel Bouche, NP as PCP - General (Nurse Practitioner) Darius Bump, [facility] as Pharmacist (Pharmacist)  Objective:  Lab Results  Component Value Date   CREATININE 2.97 (H) 03/03/2022   CREATININE 2.24 (H) 12/02/2021   CREATININE 1.94 (H) 12/23/2020    Lab Results  Component Value Date   HGBA1C 8.1 (A) 03/03/2022   Last  diabetic Eye exam:  Lab Results  Component Value Date/Time   HMDIABEYEEXA Retinopathy (A) 06/16/2021 12:00 AM       Component Value Date/Time   CHOL 101 12/02/2021 0000   TRIG 75 12/02/2021 0000   HDL 48 12/02/2021 0000   CHOLHDL 2.1 12/02/2021 0000   LDLCALC 37 12/02/2021 0000       Latest Ref Rng & Units 12/02/2021   12:00 AM 12/23/2020   12:18 PM 11/26/2019   11:14 AM  Hepatic Function  Total Protein 6.1 - 8.1 g/dL 6.1  6.1  5.9   AST 10 - 35 U/L 12  10  9    ALT 9 - 46 U/L 10  14  9    Total Bilirubin 0.2 - 1.2 mg/dL 0.4  0.3  0.4     Lab Results  Component Value Date/Time   TSH 1.25 11/26/2019 11:14 AM       Latest Ref Rng & Units 03/03/2022   12:00 AM 12/02/2021   12:00 AM 11/26/2019   11:14 AM  CBC  WBC 3.8 - 10.8 Thousand/uL 9.8  11.8  11.6   Hemoglobin 13.2 - 17.1 g/dL 13.9  13.6  8.5   Hematocrit 38.5 - 50.0 % 40.9  41.0  26.7   Platelets 140 - 400 Thousand/uL 214  229  397     Lab Results  Component Value Date/Time   VD25OH 8 (L) 12/27/2018 11:56 AM     Social History   Tobacco Use  Smoking Status Every Day   Packs/day: 0.50   Types: Cigarettes  Smokeless  Tobacco Never  Tobacco Comments   refused   BP Readings from Last 3 Encounters:  03/03/22 121/73  01/12/22 (!) 113/54  12/02/21 (!) 166/75   Pulse Readings from Last 3 Encounters:  03/03/22 93  01/12/22 69  12/02/21 84   Wt Readings from Last 3 Encounters:  03/03/22 155 lb 0.6 oz (70.3 kg)  12/02/21 153 lb 0.6 oz (69.4 kg)  10/06/21 153 lb 1.9 oz (69.5 kg)    Assessment: Review of patient past medical history, allergies, medications, health status, including review of consultants reports, laboratory and other test data, was performed as part of comprehensive evaluation and provision of chronic care management services.   SDOH:  (Social Determinants of Health) assessments and interventions performed:  SDOH Interventions    Flowsheet Row Clinical Support from 10/06/2021 in [place] Management from 12/23/2020 in [place] Office Visit from 07/23/2020 in [place] Interventions Intervention Not Indicated -- Intervention Not Indicated  Housing Interventions Intervention Not Indicated -- Intervention Not Indicated  Transportation Interventions Intervention Not Indicated -- Intervention Not Indicated  Financial Strain Interventions Intervention Not Indicated Other (Comment)  [patient assistance program for medication] --  Physical Activity Interventions Intervention Not Indicated -- Intervention Not Indicated  Stress Interventions Intervention Not Indicated -- Intervention Not Indicated  Social Connections Interventions Intervention Not Indicated -- Intervention Not Indicated       CCM Care Plan  No Known Allergies  Medications Reviewed Today     Reviewed by Lorenda Peck, DPM (Physician) on 04/15/22 at 1028  Med List Status: <None>   Medication Order Taking? Sig Documenting Provider Last Dose Status Informant  Accu-Chek Softclix Lancets lancets [id number] No  [provider] Taking Active   aspirin 81 MG tablet [id number] No Take 1 tablet (81 mg total) by mouth daily. Emeterio Reeve, DO Taking Active   blood glucose meter kit and supplies [id number] No Dispense based on patient and insurance preference. Use up to four times daily as directed. (FOR ICD-10 E10.9, E11.9). Emeterio Reeve, DO Taking Active   cilostazol (PLETAL) 100 MG tablet [id number] No TAKE 1 TABLET TWICE DAILY Samuel Bouche, NP Taking Active   FEROSUL 325 (65 Fe) MG tablet [id number] No TAKE 1 TABLET TWICE DAILY WITH MEALS Emeterio Reeve, DO Taking Active   furosemide (LASIX) 20 MG tablet [id number] No TAKE 1 TABLET EVERY DAY Jessup, Joy, NP Taking Active   insulin glargine, 2 Unit Dial, (TOUJEO MAX SOLOSTAR) 300 UNIT/ML  Solostar Pen [id number] No Inject 16 Units into the skin at bedtime. Increase by 4 units every 4 days until fasting (morning) blood sugar is <150, then stay at that dose.  Patient taking differently: Inject 35 Units into the skin at bedtime. Increase by 4 units every 4 days until fasting (morning) blood sugar is <150, then stay at that dose. (Taking 35 units daily as of 06/23/21)   Emeterio Reeve, DO Taking Active            Med Note Dorene Ar Mar 24, 2021 10:11 AM) Taking 42 units as of 03/24/21   Insulin Pen Needle 29G X 12.7MM MISC [id number] No 100 each by Does not apply route daily. Please dispense qty, brand, appropriate pen sizing for toujeo solostar Samuel Bouche, NP Taking Active   MODERNA COVID-19 BIVAL BOOSTER 50 MCG/0.5ML injection [id number] No  [provider] Taking Active   Oxycodone HCl 10 MG TABS [id number] No Take 0.5-1 tablets (5-10 mg total) by mouth every 6 (six) hours as needed. 10 tablets must last 30 days Samuel Bouche, NP Taking Active   Oxycodone HCl 10 MG TABS [id number] No Take 0.5-1 tablets (5-10 mg total) by mouth every 6 (six) hours as needed. 10 tablets must last 30 days Samuel Bouche, NP Taking Active   Oxycodone HCl 10 MG TABS [id number] No Take 0.5-1 tablets (5-10 mg total) by mouth every 6 (six) hours as needed. 10 tablets must last 30 days Samuel Bouche, NP Taking Active   pantoprazole (PROTONIX) 40 MG tablet [id number] No TAKE 1 TABLET EVERY DAY Samuel Bouche, NP Taking Active   potassium chloride (KLOR-CON M) 10 MEQ tablet [id number] No TAKE 1 TABLET EVERY DAY Jessup, Joy, NP Taking Active   rosuvastatin (CRESTOR) 40 MG tablet [id number] No TAKE 1 TABLET (40 MG TOTAL) BY MOUTH DAILY. OFFICE VISIT REQUIRED PRIOR TO ANY FURTHER REFILLS Samuel Bouche, NP Taking Active   TRUE METRIX BLOOD GLUCOSE TEST test strip [id number] No TEST UP TO FOUR TIMES DAILY AS DIRECTED Samuel Bouche, NP Taking Active   valsartan (DIOVAN) 40 MG tablet [id number] No Take 1 tablet (40 mg  total) by mouth daily. Samuel Bouche, NP Taking Active             Patient Active Problem List   Diagnosis Date Noted   Abnormal renal function 03/02/2022   Leukocytosis 03/02/2022   Chronic, continuous use of opioids 03/02/2022   Pilonidal cyst 11/25/2019   AKI (acute kidney injury) ([place]) 11/03/2019   Hyponatremia 11/03/2019   Sepsis ([place]) 11/03/2019   Weakness 11/03/2019   Osteomyelitis of finger of left hand ([place])    Vitamin D deficiency 12/28/2018   Erectile dysfunction 12/28/2018   PVC (premature ventricular contraction) 12/27/2018   Essential hypertension 10/11/2018   Coronary artery disease 10/11/2018   Peripheral vascular disease ([place]) 10/11/2018   Mixed hyperlipidemia 10/11/2018   Osteoarthritis 10/11/2018   CKD (chronic kidney disease) stage 4, GFR 15-29 ml/min (HCC) 10/11/2018   History of anemia 10/11/2018   Other chronic pain 06/08/2018   Vaccine refused by patient 06/07/2018   Abnormal stress test 04/10/2018   Non-traumatic rhabdomyolysis 02/01/2018   Hypokalemia 02/01/2018   Hypophosphatemia 02/01/2018   Perianal abscess 10/16/2017   Nicotine dependence with current use 10/12/2017   Perirectal abscess 10/12/2017   Drug-induced constipation 10/12/2017   GI bleed 09/15/2017   Elevated troponin 06/15/2017   Chronic diastolic heart failure ([place]) 06/15/2017   Normocytic anemia 06/15/2017   Type 2 diabetes mellitus with diabetic neuropathy, with long-term current use of insulin ([place]) 04/24/2017   History of cerebrovascular accident 09/20/2016   Bone lesion 09/29/2014   Finger pain, right 09/29/2014   S/P coronary artery stent placement 12/20/2012   Old myocardial infarction 10/27/2010    Immunization History  Administered Date(s) Administered   Janssen (J&J) SARS-COV-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 12/04/2019   PFIZER(Purple Top)SARS-COV-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 06/10/2020    Conditions to be addressed/monitored: HTN, HLD, and DMII  There are no care plans that you recently  modified to display for this patient.         Medication Assistance: Patient assistance for toujeo ([employer] PAP) approved 12/28/20, medication delivered to primary care office 01/06/21, 90DS will continue to be delivered. Recertification of application for 8786 year can be completed July 22, 2021.  Patient's preferred pharmacy is:  [facility], [address] [place] [address] Phone: [phone] Fax: [phone]  [facility], [address] [place] [address] Phone: [phone] Fax: [phone]   Uses pill box? Yes Pt endorses 100% compliance  Follow Up:  Patient agrees to Care Plan and Follow-up.  Plan: Telephone follow up appointment with care management team member scheduled for:  1-2 month  Larinda Buttery, PharmD Clinical Pharmacist [facility] [phone]

## 2022-04-27 ENCOUNTER — Ambulatory Visit (INDEPENDENT_AMBULATORY_CARE_PROVIDER_SITE_OTHER): Payer: Medicare HMO | Admitting: Pharmacist

## 2022-04-27 DIAGNOSIS — Z794 Long term (current) use of insulin: Secondary | ICD-10-CM

## 2022-04-27 DIAGNOSIS — I10 Essential (primary) hypertension: Secondary | ICD-10-CM

## 2022-04-27 DIAGNOSIS — E1140 Type 2 diabetes mellitus with diabetic neuropathy, unspecified: Secondary | ICD-10-CM

## 2022-04-27 DIAGNOSIS — E782 Mixed hyperlipidemia: Secondary | ICD-10-CM

## 2022-05-02 ENCOUNTER — Other Ambulatory Visit: Payer: Self-pay | Admitting: Medical-Surgical

## 2022-05-11 ENCOUNTER — Telehealth: Payer: Self-pay | Admitting: Medical-Surgical

## 2022-05-11 NOTE — Telephone Encounter (Signed)
Received 1 box of Toujeo on 05/11/2022 at 1:43. I will call patient and place in fridge for pickup - CF

## 2022-05-19 NOTE — Telephone Encounter (Signed)
Patient picked medication up - CF

## 2022-06-01 ENCOUNTER — Telehealth: Payer: Self-pay | Admitting: Pharmacist

## 2022-06-01 NOTE — Telephone Encounter (Signed)
Pharmacy Documentation   Patient Assistance: Wilder Glade Patient's current dose: Not yet started, new application for 5mg  daily  Patient completed patient section of application and brought to front office.  It is now processed and placed into PCP box for provider signature, then fax to [employer], then scan into media tab.   Larinda Buttery, PharmD Clinical Pharmacist [facility] [phone]

## 2022-06-03 ENCOUNTER — Encounter: Payer: Self-pay | Admitting: Medical-Surgical

## 2022-06-03 ENCOUNTER — Ambulatory Visit (INDEPENDENT_AMBULATORY_CARE_PROVIDER_SITE_OTHER): Payer: Medicare HMO | Admitting: Medical-Surgical

## 2022-06-03 VITALS — BP 131/71 | HR 87 | Ht 69.0 in | Wt 156.0 lb

## 2022-06-03 DIAGNOSIS — E1140 Type 2 diabetes mellitus with diabetic neuropathy, unspecified: Secondary | ICD-10-CM

## 2022-06-03 DIAGNOSIS — E782 Mixed hyperlipidemia: Secondary | ICD-10-CM | POA: Diagnosis not present

## 2022-06-03 DIAGNOSIS — F1190 Opioid use, unspecified, uncomplicated: Secondary | ICD-10-CM | POA: Diagnosis not present

## 2022-06-03 DIAGNOSIS — Z794 Long term (current) use of insulin: Secondary | ICD-10-CM

## 2022-06-03 DIAGNOSIS — I10 Essential (primary) hypertension: Secondary | ICD-10-CM

## 2022-06-03 LAB — POCT GLYCOSYLATED HEMOGLOBIN (HGB A1C): HbA1c, POC (controlled diabetic range): 8.4 % — AB (ref 0.0–7.0)

## 2022-06-03 MED ORDER — OXYCODONE HCL 10 MG PO TABS: 5.0000 mg | ORAL_TABLET | Freq: Four times a day (QID) | ORAL | 0 refills | Status: DC | PRN

## 2022-06-03 NOTE — Progress Notes (Signed)
Established Patient Office Visit  Subjective   Patient ID: Joseph Grimes, male   DOB: 07/31/1961 Age: 60 y.o. MRN: [id number]   Chief Complaint  Patient presents with   Diabetes   Hypertension   Hyperlipidemia    HPI Pleasant 61 year old male presenting today for the following:  Diabetes: Checking sugars at home and reports his fasting blood sugar this morning was 87, yesterday 149.  Went to [place] to spread his twin brother's ashes.  He forgot his Toujeo at home so he missed a few days of his dosing.  Reports that he continues to work with our clinical pharmacist and there is a plan to try to get Wilder Glade approved so that he can add this to his Toujeo.  Currently taking Toujeo 34 units daily.  Hypertension: Not checking blood pressures at home.  Taking valsartan 40 mg daily as prescribed, tolerating well without side effects.  Continues to add salt to his food on a regular basis.  Activity/exercise limited by orthopedic conditions.  Hyperlipidemia: Taking rosuvastatin 40 mg daily, tolerating well without side effects.  Chronic pain: Has had quite a bit of difficulty getting his oxycodone prescription filled with the mail order pharmacy.  Is interested and switching to a local pharmacy to see if this will solve the issue.  Using oxycodone 30 tabs every 90 days.  Takes it on an as-needed basis for severe pain that is not responsive to other measures.  Has been on this treatment chronically without changes to his regimen.  Up-to-date on controlled medicine agreement and drug testing.   Objective:    Vitals:   06/03/22 1038  BP: 131/71  Pulse: 87  Height: 5\' 9"  (1.753 m)  Weight: 156 lb (70.8 kg)  SpO2: 96%  BMI (Calculated): 23.03    Physical Exam Vitals reviewed.  Constitutional:      General: He is not in acute distress.    Appearance: Normal appearance. He is normal weight. He is not ill-appearing.  HENT:     Head: Normocephalic and atraumatic.  Cardiovascular:      Rate and Rhythm: Normal rate and regular rhythm.     Pulses: Normal pulses.     Heart sounds: Normal heart sounds. No murmur heard.    No friction rub. No gallop.  Pulmonary:     Effort: Pulmonary effort is normal. No respiratory distress.     Breath sounds: Normal breath sounds.  Skin:    General: Skin is warm and dry.  Neurological:     Mental Status: He is alert and oriented to person, place, and time.  Psychiatric:        Mood and Affect: Mood normal.        Behavior: Behavior normal.        Thought Content: Thought content normal.        Judgment: Judgment normal.    Results for orders placed or performed in visit on 06/03/22 (from the past 24 hour(s))  POCT HgB A1C     Status: Abnormal   Collection Time: 06/03/22 10:44 AM  Result Value Ref Range   Hemoglobin A1C     HbA1c POC (<> result, manual entry)     HbA1c, POC (prediabetic range)     HbA1c, POC (controlled diabetic range) 8.4 (A) 0.0 - 7.0 %       The ASCVD Risk score (Arnett DK, et al., 2019) failed to calculate for the following reasons:   The patient has a prior MI or stroke diagnosis  Assessment & Plan:   1. Type 2 diabetes mellitus with diabetic neuropathy, with long-term current use of insulin (HCC) POCT hemoglobin A1c 8.4%.  Discussed the importance of taking Toujeo daily.  Plan to start Wilder Glade if patient assistance forms go through.  Continue monitoring blood sugars with a fasting goal of 120 or less.  Follow-up with clinical pharmacy as scheduled. - POCT HgB A1C  2. Essential hypertension Continue valsartan 40 mg daily.  Blood pressure at goal today.  Recommend monitoring blood pressure at home with a goal of 130/80 or less.  Low-sodium diet strongly recommended.  3. Mixed hyperlipidemia Continue Crestor 40 mg daily as prescribed.  4. Chronic, continuous use of opioids Switching oxycodone to a local pharmacy to see if this takes out the stress of getting his pain medication. - Oxycodone HCl  10 MG TABS; Take 0.5-1 tablets (5-10 mg total) by mouth every 6 (six) hours as needed. Next refill due 08/02/2022  Dispense: 30 tablet; Refill: 0  Return in about 3 months (around 09/03/2022) for DM/HTN/HLD follow up.  ___________________________________________ Clearnce Sorrel, DNP, APRN, FNP-BC Primary Care and [place]

## 2022-06-09 ENCOUNTER — Telehealth: Payer: Medicare HMO

## 2022-06-09 ENCOUNTER — Telehealth: Payer: Self-pay | Admitting: Pharmacist

## 2022-06-09 NOTE — Telephone Encounter (Signed)
Unsuccessful attempt x2 to contact patient for follow-up phone call for CCM services with pharmacist.  Contacted [employer] yesterday for [place] patient assistance status update, and application is still in process.  Will reschedule phone appointment with patient for later this month.  Larinda Buttery, PharmD Clinical Pharmacist [facility] [phone]

## 2022-06-09 NOTE — Progress Notes (Deleted)
Chronic Care Management Pharmacy Note  06/09/2022 Name:  Joseph Grimes MRN:  [id number] DOB:  1961/01/16  Summary:  addressed HTN, HLD, and primarily DM. Patient recently tried ozempic but did not tolerate due to severe diarrhea. July 2023 PCP visit completed labwork that reveals declining renal function, now in process of obtaining farxiga using patient assistance. Patient describes he forgot to pick up the paperwork.  BG 84 this morning, 120s usually.  Patient described a recent episode of hypoglycemia, BG 55, after taking 36 units of toujeo the night before.  He takes 32-24 units of toujeo on normal days, but if larger meal intake, sometimes takes 36 units.   Recommendations/Changes made from today's visit:  - Provided patient assistance paperwork for farxiga at front desk to pick up 04/27/22, he will fill out patient section, and return. - Advised patient to stay on 34 units of toujeo to avoid hypoglycemia  Plan: f/u with pharmacist in 1-2 month  Subjective: Joseph Grimes is an 61 y.o. year old male who is a primary patient of Samuel Bouche, NP.  The CCM team was consulted for assistance with disease management and care coordination needs.    Engaged with patient by telephone for follow up visit in response to provider referral for pharmacy case management and/or care coordination services.   Consent to Services:  The patient was given information about Chronic Care Management services, agreed to services, and gave verbal consent prior to initiation of services.  Please see initial visit note for detailed documentation.   Patient Care Team: Samuel Bouche, NP as PCP - General (Nurse Practitioner) Darius Bump, [facility] as Pharmacist (Pharmacist)  Objective:  Lab Results  Component Value Date   CREATININE 2.97 (H) 03/03/2022   CREATININE 2.24 (H) 12/02/2021   CREATININE 1.94 (H) 12/23/2020    Lab Results  Component Value Date   HGBA1C 8.4 (A) 06/03/2022   Last  diabetic Eye exam:  Lab Results  Component Value Date/Time   HMDIABEYEEXA Retinopathy (A) 06/16/2021 12:00 AM       Component Value Date/Time   CHOL 101 12/02/2021 0000   TRIG 75 12/02/2021 0000   HDL 48 12/02/2021 0000   CHOLHDL 2.1 12/02/2021 0000   LDLCALC 37 12/02/2021 0000       Latest Ref Rng & Units 12/02/2021   12:00 AM 12/23/2020   12:18 PM 11/26/2019   11:14 AM  Hepatic Function  Total Protein 6.1 - 8.1 g/dL 6.1  6.1  5.9   AST 10 - 35 U/L _0 ALT 9 - 46 U/L _1 Total Bilirubin 0.2 - 1.2 mg/dL 0.4  0.3  0.4     Lab Results  Component Value Date/Time   TSH 1.25 11/26/2019 11:14 AM       Latest Ref Rng & Units 03/03/2022   12:00 AM 12/02/2021   12:00 AM 11/26/2019   11:14 AM  CBC  WBC 3.8 - 10.8 Thousand/uL 9.8  11.8  11.6   Hemoglobin 13.2 - 17.1 g/dL 13.9  13.6  8.5   Hematocrit 38.5 - 50.0 % 40.9  41.0  26.7   Platelets 140 - 400 Thousand/uL 214  229  397     Lab Results  Component Value Date/Time   VD25OH 8 (L) 12/27/2018 11:56 AM     Social History   Tobacco Use  Smoking Status Every Day   Packs/day: 0.50   Types: Cigarettes  Smokeless  Tobacco Never  Tobacco Comments   refused   BP Readings from Last 3 Encounters:  06/03/22 131/71  03/03/22 121/73  01/12/22 (!) 113/54   Pulse Readings from Last 3 Encounters:  06/03/22 87  03/03/22 93  01/12/22 69   Wt Readings from Last 3 Encounters:  06/03/22 156 lb (70.8 kg)  03/03/22 155 lb 0.6 oz (70.3 kg)  12/02/21 153 lb 0.6 oz (69.4 kg)    Assessment: Review of patient past medical history, allergies, medications, health status, including review of consultants reports, laboratory and other test data, was performed as part of comprehensive evaluation and provision of chronic care management services.   SDOH:  (Social Determinants of Health) assessments and interventions performed:  SDOH Interventions    Flowsheet Row Clinical Support from 10/06/2021 in [place] Management from 12/23/2020 in [place] Office Visit from 07/23/2020 in [place] Interventions Intervention Not Indicated -- Intervention Not Indicated  Housing Interventions Intervention Not Indicated -- Intervention Not Indicated  Transportation Interventions Intervention Not Indicated -- Intervention Not Indicated  Financial Strain Interventions Intervention Not Indicated Other (Comment)  [patient assistance program for medication] --  Physical Activity Interventions Intervention Not Indicated -- Intervention Not Indicated  Stress Interventions Intervention Not Indicated -- Intervention Not Indicated  Social Connections Interventions Intervention Not Indicated -- Intervention Not Indicated       CCM Care Plan  Allergies  Allergen Reactions   Ozempic (0.25 Or 0.5 Mg-Dose) [Semaglutide(0.25 Or 0.99m-Dos)] Diarrhea    Medications Reviewed Today     Reviewed by JSamuel Bouche NP (Nurse Practitioner) on 06/03/22 at 1123  Med List Status: <None>   Medication Order Taking? Sig Documenting Provider Last Dose Status Informant  Accu-Chek Softclix Lancets lancets [id number]  [provider] Taking Active   aspirin 81 MG tablet [id number] Take 1 tablet (81 mg total) by mouth daily. AEmeterio Reeve DO Taking Active   blood glucose meter kit and supplies [id number] Dispense based on patient and insurance preference. Use up to four times daily as directed. (FOR ICD-10 E10.9, E11.9). AEmeterio Reeve DO Taking Active   cilostazol (PLETAL) 100 MG tablet [id number] TAKE 1 TABLET TWICE DAILY JSamuel Bouche NP Taking Active   FEROSUL 325 (65 Fe) MG tablet [id number] TAKE 1 TABLET TWICE DAILY WITH MEALS AEmeterio Reeve DO Taking Active   furosemide (LASIX) 20 MG tablet [id number] TAKE 1 TABLET EVERY DAY Jessup, Joy, NP  Taking Active   insulin glargine, 2 Unit Dial, (TOUJEO MAX SOLOSTAR) 300 UNIT/ML Solostar Pen [id number] Inject 16 Units into the skin at bedtime. Increase by 4 units every 4 days until fasting (morning) blood sugar is <150, then stay at that dose.  Patient taking differently: Inject 35 Units into the skin at bedtime. Increase by 4 units every 4 days until fasting (morning) blood sugar is <150, then stay at that dose. (Taking 35 units daily as of 06/23/21)   AEmeterio Reeve DO Taking Active            Med Note (Dorene ArAug 17, 2022 10:11 AM) Taking 42 units as of 03/24/21   Insulin Pen Needle 29G X 12.7MM MISC [id number] 100 each by Does not apply route daily. Please dispense qty, brand, appropriate pen sizing for toujeo solostar JSamuel Bouche NP Taking Active  MODERNA COVID-19 BIVAL BOOSTER 50 MCG/0.5ML injection [id number] Yes  [provider] Taking Active   Oxycodone HCl 10 MG TABS [id number]  Take 0.5-1 tablets (5-10 mg total) by mouth every 6 (six) hours as needed. Next refill due 08/02/2022 Samuel Bouche, NP  Active   pantoprazole (PROTONIX) 40 MG tablet [id number] Yes TAKE 1 TABLET EVERY DAY Samuel Bouche, NP Taking Active   potassium chloride (KLOR-CON M) 10 MEQ tablet [id number] Yes TAKE 1 TABLET EVERY DAY Jessup, Joy, NP Taking Active   rosuvastatin (CRESTOR) 40 MG tablet [id number] Yes TAKE 1 TABLET (40 MG TOTAL) BY MOUTH DAILY. OFFICE VISIT REQUIRED PRIOR TO ANY FURTHER REFILLS Samuel Bouche, NP Taking Active   TRUE METRIX BLOOD GLUCOSE TEST test strip [id number] Yes TEST UP TO FOUR TIMES DAILY AS DIRECTED Samuel Bouche, NP Taking Active   valsartan (DIOVAN) 40 MG tablet [id number] Yes Take 1 tablet (40 mg total) by mouth daily. Samuel Bouche, NP Taking Active             Patient Active Problem List   Diagnosis Date Noted   Abnormal renal function 03/02/2022   Leukocytosis 03/02/2022   Chronic, continuous use of opioids 03/02/2022   Pilonidal cyst 11/25/2019    AKI (acute kidney injury) ([place]) 11/03/2019   Hyponatremia 11/03/2019   Sepsis ([place]) 11/03/2019   Weakness 11/03/2019   Osteomyelitis of finger of left hand ([place])    Vitamin D deficiency 12/28/2018   Erectile dysfunction 12/28/2018   PVC (premature ventricular contraction) 12/27/2018   Essential hypertension 10/11/2018   Coronary artery disease 10/11/2018   Peripheral vascular disease ([place]) 10/11/2018   Mixed hyperlipidemia 10/11/2018   Osteoarthritis 10/11/2018   CKD (chronic kidney disease) stage 4, GFR 15-29 ml/min (HCC) 10/11/2018   History of anemia 10/11/2018   Other chronic pain 06/08/2018   Vaccine refused by patient 06/07/2018   Abnormal stress test 04/10/2018   Non-traumatic rhabdomyolysis 02/01/2018   Hypokalemia 02/01/2018   Hypophosphatemia 02/01/2018   Perianal abscess 10/16/2017   Nicotine dependence with current use 10/12/2017   Perirectal abscess 10/12/2017   Drug-induced constipation 10/12/2017   GI bleed 09/15/2017   Elevated troponin 06/15/2017   Chronic diastolic heart failure ([place]) 06/15/2017   Normocytic anemia 06/15/2017   Type 2 diabetes mellitus with diabetic neuropathy, with long-term current use of insulin ([place]) 04/24/2017   History of cerebrovascular accident 09/20/2016   Bone lesion 09/29/2014   Finger pain, right 09/29/2014   S/P coronary artery stent placement 12/20/2012   Old myocardial infarction 10/27/2010    Immunization History  Administered Date(s) Administered   Janssen (J&J) SARS-COV-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 12/04/2019   PFIZER(Purple Top)SARS-COV-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 06/10/2020    Conditions to be addressed/monitored: HTN, HLD, and DMII  There are no care plans that you recently modified to display for this patient.         Medication Assistance: Patient assistance for toujeo (San[employer]i PAP) approved 12/28/20, medication delivered to primary care office 01/06/21, 90DS will continue to be delivered. Recertification of application for 2505 year can  be completed July 22, 2021.  Patient's preferred pharmacy is:  [id number] - [place], [address] [place] [address] Phone: [phone] Fax: [phone]  [place], [place] [address] Phone: [phone] Fax: [phone]   Uses pill box? Yes Pt endorses 100% compliance  Follow Up:  Patient  agrees to Care Plan and Follow-up.  Plan: Telephone follow up appointment with care management team member scheduled for:  1-2 month  Larinda Buttery, PharmD Clinical Pharmacist [facility] [phone]

## 2022-06-14 ENCOUNTER — Telehealth: Payer: Medicare HMO

## 2022-06-14 NOTE — Progress Notes (Deleted)
Chronic Care Management Pharmacy Note  06/14/2022 Name:  Joseph Grimes MRN:  [id number] DOB:  1961-03-30  Summary:  addressed HTN, HLD, and primarily DM. Patient  in process of obtaining farxiga using patient assistance.   BG 84 this morning, 120s usually.  Patient described a recent episode of hypoglycemia, BG 55, after taking 36 units of toujeo the night before.  He takes 32-24 units of toujeo on normal days, but if larger meal intake, sometimes takes 36 units.   Recommendations/Changes made from today's visit:  - Provided patient assistance paperwork for farxiga at front desk to pick up 04/27/22, he will fill out patient section, and return. - Advised patient to stay on 34 units of toujeo to avoid hypoglycemia  Plan: f/u with pharmacist in 1-2 month  Subjective: Joseph Grimes is an 61 y.o. year old male who is a primary patient of Samuel Bouche, NP.  The CCM team was consulted for assistance with disease management and care coordination needs.    Engaged with patient by telephone for follow up visit in response to provider referral for pharmacy case management and/or care coordination services.   Consent to Services:  The patient was given information about Chronic Care Management services, agreed to services, and gave verbal consent prior to initiation of services.  Please see initial visit note for detailed documentation.   Patient Care Team: Samuel Bouche, NP as PCP - General (Nurse Practitioner) Darius Bump, [facility] as Pharmacist (Pharmacist)  Objective:  Lab Results  Component Value Date   CREATININE 2.97 (H) 03/03/2022   CREATININE 2.24 (H) 12/02/2021   CREATININE 1.94 (H) 12/23/2020    Lab Results  Component Value Date   HGBA1C 8.4 (A) 06/03/2022   Last diabetic Eye exam:  Lab Results  Component Value Date/Time   HMDIABEYEEXA Retinopathy (A) 06/16/2021 12:00 AM       Component Value Date/Time   CHOL 101 12/02/2021 0000   TRIG 75 12/02/2021 0000    HDL 48 12/02/2021 0000   CHOLHDL 2.1 12/02/2021 0000   LDLCALC 37 12/02/2021 0000       Latest Ref Rng & Units 12/02/2021   12:00 AM 12/23/2020   12:18 PM 11/26/2019   11:14 AM  Hepatic Function  Total Protein 6.1 - 8.1 g/dL 6.1  6.1  5.9   AST 10 - 35 U/L _0 ALT 9 - 46 U/L _1 Total Bilirubin 0.2 - 1.2 mg/dL 0.4  0.3  0.4     Lab Results  Component Value Date/Time   TSH 1.25 11/26/2019 11:14 AM       Latest Ref Rng & Units 03/03/2022   12:00 AM 12/02/2021   12:00 AM 11/26/2019   11:14 AM  CBC  WBC 3.8 - 10.8 Thousand/uL 9.8  11.8  11.6   Hemoglobin 13.2 - 17.1 g/dL 13.9  13.6  8.5   Hematocrit 38.5 - 50.0 % 40.9  41.0  26.7   Platelets 140 - 400 Thousand/uL 214  229  397     Lab Results  Component Value Date/Time   VD25OH 8 (L) 12/27/2018 11:56 AM     Social History   Tobacco Use  Smoking Status Every Day   Packs/day: 0.50   Types: Cigarettes  Smokeless Tobacco Never  Tobacco Comments   refused   BP Readings from Last 3 Encounters:  06/03/22 131/71  03/03/22 121/73  01/12/22 (!) 113/54   Pulse Readings  from Last 3 Encounters:  06/03/22 87  03/03/22 93  01/12/22 69   Wt Readings from Last 3 Encounters:  06/03/22 156 lb (70.8 kg)  03/03/22 155 lb 0.6 oz (70.3 kg)  12/02/21 153 lb 0.6 oz (69.4 kg)    Assessment: Review of patient past medical history, allergies, medications, health status, including review of consultants reports, laboratory and other test data, was performed as part of comprehensive evaluation and provision of chronic care management services.   SDOH:  (Social Determinants of Health) assessments and interventions performed:  SDOH Interventions    Flowsheet Row Clinical Support from 10/06/2021 in [place] Management from 12/23/2020 in [place] Office Visit from 07/23/2020 in [place] Interventions Intervention Not Indicated -- Intervention Not Indicated  Housing Interventions Intervention Not Indicated -- Intervention Not Indicated  Transportation Interventions Intervention Not Indicated -- Intervention Not Indicated  Financial Strain Interventions Intervention Not Indicated Other (Comment)  [patient assistance program for medication] --  Physical Activity Interventions Intervention Not Indicated -- Intervention Not Indicated  Stress Interventions Intervention Not Indicated -- Intervention Not Indicated  Social Connections Interventions Intervention Not Indicated -- Intervention Not Indicated       CCM Care Plan  Allergies  Allergen Reactions   Ozempic (0.25 Or 0.5 Mg-Dose) [Semaglutide(0.25 Or 0.22m-Dos)] Diarrhea    Medications Reviewed Today     Reviewed by JSamuel Bouche NP (Nurse Practitioner) on 06/03/22 at 1123  Med List Status: <None>   Medication Order Taking? Sig Documenting Provider Last Dose Status Informant  Accu-Chek Softclix Lancets lancets [id number]  [provider] Taking Active   aspirin 81 MG tablet [id number] Take 1 tablet (81 mg total) by mouth daily. AEmeterio Reeve DO Taking Active   blood glucose meter kit and supplies [id number] Dispense based on patient and insurance preference. Use up to four times daily as directed. (FOR ICD-10 E10.9, E11.9). AEmeterio Reeve DO Taking Active   cilostazol (PLETAL) 100 MG tablet [id number] TAKE 1 TABLET TWICE DAILY JSamuel Bouche NP Taking Active   FEROSUL 325 (65 Fe) MG tablet [id number] TAKE 1 TABLET TWICE DAILY WITH MEALS AEmeterio Reeve DO Taking Active   furosemide (LASIX) 20 MG tablet [id number] TAKE 1 TABLET EVERY DAY Jessup, Joy, NP Taking Active   insulin glargine, 2 Unit Dial, (TOUJEO MAX SOLOSTAR) 300 UNIT/ML Solostar Pen [id number] Inject 16 Units into the skin at bedtime. Increase by 4 units every 4 days until fasting  (morning) blood sugar is <150, then stay at that dose.  Patient taking differently: Inject 35 Units into the skin at bedtime. Increase by 4 units every 4 days until fasting (morning) blood sugar is <150, then stay at that dose. (Taking 35 units daily as of 06/23/21)   AEmeterio Reeve DO Taking Active            Med Note (Dorene ArAug 17, 2022 10:11 AM) Taking 42 units as of 03/24/21   Insulin Pen Needle 29G X 12.7MM MISC [id number] 100 each by Does not apply route daily. Please dispense qty, brand, appropriate pen sizing for toujeo solostar JSamuel Bouche NP Taking Active   MODERNA COVID-19 BIVAL BOOSTER 50 MCG/0.5ML injection [id number]  [provider] Taking Active   Oxycodone HCl 10 MG TABS [id number] Take 0.5-1 tablets (5-10 mg  total) by mouth every 6 (six) hours as needed. Next refill due 08/02/2022 Samuel Bouche, NP  Active   pantoprazole (PROTONIX) 40 MG tablet [id number] Yes TAKE 1 TABLET EVERY DAY Samuel Bouche, NP Taking Active   potassium chloride (KLOR-CON M) 10 MEQ tablet [id number] Yes TAKE 1 TABLET EVERY DAY Jessup, Joy, NP Taking Active   rosuvastatin (CRESTOR) 40 MG tablet [id number] Yes TAKE 1 TABLET (40 MG TOTAL) BY MOUTH DAILY. OFFICE VISIT REQUIRED PRIOR TO ANY FURTHER REFILLS Samuel Bouche, NP Taking Active   TRUE METRIX BLOOD GLUCOSE TEST test strip [id number] Yes TEST UP TO FOUR TIMES DAILY AS DIRECTED Samuel Bouche, NP Taking Active   valsartan (DIOVAN) 40 MG tablet [id number] Yes Take 1 tablet (40 mg total) by mouth daily. Samuel Bouche, NP Taking Active             Patient Active Problem List   Diagnosis Date Noted   Abnormal renal function 03/02/2022   Leukocytosis 03/02/2022   Chronic, continuous use of opioids 03/02/2022   Pilonidal cyst 11/25/2019   AKI (acute kidney injury) ([place]) 11/03/2019   Hyponatremia 11/03/2019   Sepsis ([place]) 11/03/2019   Weakness 11/03/2019   Osteomyelitis of finger of left hand ([place])    Vitamin D deficiency  12/28/2018   Erectile dysfunction 12/28/2018   PVC (premature ventricular contraction) 12/27/2018   Essential hypertension 10/11/2018   Coronary artery disease 10/11/2018   Peripheral vascular disease ([place]) 10/11/2018   Mixed hyperlipidemia 10/11/2018   Osteoarthritis 10/11/2018   CKD (chronic kidney disease) stage 4, GFR 15-29 ml/min (HCC) 10/11/2018   History of anemia 10/11/2018   Other chronic pain 06/08/2018   Vaccine refused by patient 06/07/2018   Abnormal stress test 04/10/2018   Non-traumatic rhabdomyolysis 02/01/2018   Hypokalemia 02/01/2018   Hypophosphatemia 02/01/2018   Perianal abscess 10/16/2017   Nicotine dependence with current use 10/12/2017   Perirectal abscess 10/12/2017   Drug-induced constipation 10/12/2017   GI bleed 09/15/2017   Elevated troponin 06/15/2017   Chronic diastolic heart failure ([place]) 06/15/2017   Normocytic anemia 06/15/2017   Type 2 diabetes mellitus with diabetic neuropathy, with long-term current use of insulin ([place]) 04/24/2017   History of cerebrovascular accident 09/20/2016   Bone lesion 09/29/2014   Finger pain, right 09/29/2014   S/P coronary artery stent placement 12/20/2012   Old myocardial infarction 10/27/2010    Immunization History  Administered Date(s) Administered   Janssen (J&J) SARS-COV-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 12/04/2019   PFIZER(Purple Top)SARS-COV-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 06/10/2020    Conditions to be addressed/monitored: HTN, HLD, and DMII  There are no care plans that you recently modified to display for this patient.         Medication Assistance: Patient assistance for toujeo (Sanofi PAP) approved 12/28/20, medication delivered to primary care office 01/06/21, 90DS will continue to be delivered. Recertification of application for 2542 year can be completed July 22, 2021.  Patient's preferred pharmacy is:  [facility] #[id number] - [place], [place] [address] [place] [address] Phone: [phone] Fax: [phone]  [place], [place] [address] Phone: [phone] Fax: [phone]   Uses pill box? Yes Pt endorses 100% compliance  Follow Up:  Patient agrees to Care Plan and Follow-up.  Plan: Telephone follow up appointment with care management team member scheduled for:  1-2 month  Larinda Buttery, PharmD Clinical Pharmacist [facility] [place]

## 2022-06-20 ENCOUNTER — Ambulatory Visit (INDEPENDENT_AMBULATORY_CARE_PROVIDER_SITE_OTHER): Payer: Medicare HMO | Admitting: Pharmacist

## 2022-06-20 DIAGNOSIS — I10 Essential (primary) hypertension: Secondary | ICD-10-CM

## 2022-06-20 DIAGNOSIS — E1140 Type 2 diabetes mellitus with diabetic neuropathy, unspecified: Secondary | ICD-10-CM

## 2022-06-20 DIAGNOSIS — Z794 Long term (current) use of insulin: Secondary | ICD-10-CM

## 2022-06-20 DIAGNOSIS — E782 Mixed hyperlipidemia: Secondary | ICD-10-CM

## 2022-06-20 NOTE — Progress Notes (Unsigned)
Chronic Care Management Pharmacy Note  06/20/2022 Name:  Joseph Grimes MRN:  [id number] DOB:  1961/05/10  Summary:  addressed HTN, HLD, and primarily DM. Patient  in process of obtaining farxiga using patient assistance. Taking 34 units of toujeo.  BG fasting 98, 88, 59, 92. BG afternoon 104, 137, 118   Recommendations/Changes made from today's visit:  - Farxiga patient assistance: rejected due to insurance audit that picked up a commercial plan for patient. Will submit copy of his insurance card and letterhead from our office as proof of insurance to resubmit.  - No medication changes at this time  Plan: f/u with pharmacist in 1 month  Subjective: Joseph Grimes is an 61 y.o. year old male who is a primary patient of Samuel Bouche, NP.  The CCM team was consulted for assistance with disease management and care coordination needs.    Engaged with patient by telephone for follow up visit in response to provider referral for pharmacy case management and/or care coordination services.   Consent to Services:  The patient was given information about Chronic Care Management services, agreed to services, and gave verbal consent prior to initiation of services.  Please see initial visit note for detailed documentation.   Patient Care Team: Samuel Bouche, NP as PCP - General (Nurse Practitioner) Darius Bump, [facility] as Pharmacist (Pharmacist)  Objective:  Lab Results  Component Value Date   CREATININE 2.97 (H) 03/03/2022   CREATININE 2.24 (H) 12/02/2021   CREATININE 1.94 (H) 12/23/2020    Lab Results  Component Value Date   HGBA1C 8.4 (A) 06/03/2022   Last diabetic Eye exam:  Lab Results  Component Value Date/Time   HMDIABEYEEXA Retinopathy (A) 06/16/2021 12:00 AM       Component Value Date/Time   CHOL 101 12/02/2021 0000   TRIG 75 12/02/2021 0000   HDL 48 12/02/2021 0000   CHOLHDL 2.1 12/02/2021 0000   LDLCALC 37 12/02/2021 0000       Latest Ref Rng & Units  12/02/2021   12:00 AM 12/23/2020   12:18 PM 11/26/2019   11:14 AM  Hepatic Function  Total Protein 6.1 - 8.1 g/dL 6.1  6.1  5.9   AST 10 - 35 U/L _0 ALT 9 - 46 U/L _1 Total Bilirubin 0.2 - 1.2 mg/dL 0.4  0.3  0.4     Lab Results  Component Value Date/Time   TSH 1.25 11/26/2019 11:14 AM       Latest Ref Rng & Units 03/03/2022   12:00 AM 12/02/2021   12:00 AM 11/26/2019   11:14 AM  CBC  WBC 3.8 - 10.8 Thousand/uL 9.8  11.8  11.6   Hemoglobin 13.2 - 17.1 g/dL 13.9  13.6  8.5   Hematocrit 38.5 - 50.0 % 40.9  41.0  26.7   Platelets 140 - 400 Thousand/uL 214  229  397     Lab Results  Component Value Date/Time   VD25OH 8 (L) 12/27/2018 11:56 AM     Social History   Tobacco Use  Smoking Status Every Day   Packs/day: 0.50   Types: Cigarettes  Smokeless Tobacco Never  Tobacco Comments   refused   BP Readings from Last 3 Encounters:  06/03/22 131/71  03/03/22 121/73  01/12/22 (!) 113/54   Pulse Readings from Last 3 Encounters:  06/03/22 87  03/03/22 93  01/12/22 69   Wt Readings from Last 3 Encounters:  06/03/22 156 lb (70.8 kg)  03/03/22 155 lb 0.6 oz (70.3 kg)  12/02/21 153 lb 0.6 oz (69.4 kg)    Assessment: Review of patient past medical history, allergies, medications, health status, including review of consultants reports, laboratory and other test data, was performed as part of comprehensive evaluation and provision of chronic care management services.   SDOH:  (Social Determinants of Health) assessments and interventions performed:  SDOH Interventions    Flowsheet Row Clinical Support from 10/06/2021 in [place] Management from 12/23/2020 in [place] Office Visit from 07/23/2020 in [place] Interventions Intervention Not Indicated -- Intervention Not Indicated  Housing  Interventions Intervention Not Indicated -- Intervention Not Indicated  Transportation Interventions Intervention Not Indicated -- Intervention Not Indicated  Financial Strain Interventions Intervention Not Indicated Other (Comment)  [patient assistance program for medication] --  Physical Activity Interventions Intervention Not Indicated -- Intervention Not Indicated  Stress Interventions Intervention Not Indicated -- Intervention Not Indicated  Social Connections Interventions Intervention Not Indicated -- Intervention Not Indicated       CCM Care Plan  Allergies  Allergen Reactions   Ozempic (0.25 Or 0.5 Mg-Dose) [Semaglutide(0.25 Or 0.25m-Dos)] Diarrhea    Medications Reviewed Today     Reviewed by JSamuel Bouche NP (Nurse Practitioner) on 06/03/22 at 1123  Med List Status: <None>   Medication Order Taking? Sig Documenting Provider Last Dose Status Informant  Accu-Chek Softclix Lancets lancets [id number]  [provider] Taking Active   aspirin 81 MG tablet [id number] Take 1 tablet (81 mg total) by mouth daily. AEmeterio Reeve DO Taking Active   blood glucose meter kit and supplies [id number] Dispense based on patient and insurance preference. Use up to four times daily as directed. (FOR ICD-10 E10.9, E11.9). AEmeterio Reeve DO Taking Active   cilostazol (PLETAL) 100 MG tablet [id number] TAKE 1 TABLET TWICE DAILY JSamuel Bouche NP Taking Active   FEROSUL 325 (65 Fe) MG tablet [id number] TAKE 1 TABLET TWICE DAILY WITH MEALS AEmeterio Reeve DO Taking Active   furosemide (LASIX) 20 MG tablet [id number] TAKE 1 TABLET EVERY DAY Jessup, Joy, NP Taking Active   insulin glargine, 2 Unit Dial, (TOUJEO MAX SOLOSTAR) 300 UNIT/ML Solostar Pen [id number] Inject 16 Units into the skin at bedtime. Increase by 4 units every 4 days until fasting (morning) blood sugar is <150, then stay at that dose.  Patient taking differently: Inject 35 Units into the skin at  bedtime. Increase by 4 units every 4 days until fasting (morning) blood sugar is <150, then stay at that dose. (Taking 35 units daily as of 06/23/21)   AEmeterio Reeve DO Taking Active            Med Note (Dorene ArAug 17, 2022 10:11 AM) Taking 42 units as of 03/24/21   Insulin Pen Needle 29G X 12.7MM MISC [id number] 100 each by Does not apply route daily. Please dispense qty, brand, appropriate pen sizing for toujeo sMagdalene River NP Taking Active   MODERNA COVID-19 BIVAL BOOSTER 50 MCG/0.5ML injection [id number]  [provider] Taking Active   Oxycodone HCl 10 MG TABS [id number] Take 0.5-1 tablets (5-10 mg total) by mouth every 6 (six) hours as needed. Next refill due 08/02/2022 JSamuel Bouche NP  Active   pantoprazole ([place]  40 MG tablet [id number] Yes TAKE 1 TABLET EVERY DAY Samuel Bouche, NP Taking Active   potassium chloride (KLOR-CON M) 10 MEQ tablet [id number] Yes TAKE 1 TABLET EVERY DAY Jessup, Joy, NP Taking Active   rosuvastatin (CRESTOR) 40 MG tablet [id number] Yes TAKE 1 TABLET (40 MG TOTAL) BY MOUTH DAILY. OFFICE VISIT REQUIRED PRIOR TO ANY FURTHER REFILLS Samuel Bouche, NP Taking Active   TRUE METRIX BLOOD GLUCOSE TEST test strip [id number] Yes TEST UP TO FOUR TIMES DAILY AS DIRECTED Samuel Bouche, NP Taking Active   valsartan (DIOVAN) 40 MG tablet [id number] Yes Take 1 tablet (40 mg total) by mouth daily. Samuel Bouche, NP Taking Active             Patient Active Problem List   Diagnosis Date Noted   Abnormal renal function 03/02/2022   Leukocytosis 03/02/2022   Chronic, continuous use of opioids 03/02/2022   Pilonidal cyst 11/25/2019   AKI (acute kidney injury) ([place]) 11/03/2019   Hyponatremia 11/03/2019   Sepsis ([place]) 11/03/2019   Weakness 11/03/2019   Osteomyelitis of finger of left hand ([place])    Vitamin D deficiency 12/28/2018   Erectile dysfunction 12/28/2018   PVC (premature ventricular contraction) 12/27/2018   Essential hypertension  10/11/2018   Coronary artery disease 10/11/2018   Peripheral vascular disease ([place]) 10/11/2018   Mixed hyperlipidemia 10/11/2018   Osteoarthritis 10/11/2018   CKD (chronic kidney disease) stage 4, GFR 15-29 ml/min (HCC) 10/11/2018   History of anemia 10/11/2018   Other chronic pain 06/08/2018   Vaccine refused by patient 06/07/2018   Abnormal stress test 04/10/2018   Non-traumatic rhabdomyolysis 02/01/2018   Hypokalemia 02/01/2018   Hypophosphatemia 02/01/2018   Perianal abscess 10/16/2017   Nicotine dependence with current use 10/12/2017   Perirectal abscess 10/12/2017   Drug-induced constipation 10/12/2017   GI bleed 09/15/2017   Elevated troponin 06/15/2017   Chronic diastolic heart failure ([place]) 06/15/2017   Normocytic anemia 06/15/2017   Type 2 diabetes mellitus with diabetic neuropathy, with long-term current use of insulin ([place]) 04/24/2017   History of cerebrovascular accident 09/20/2016   Bone lesion 09/29/2014   Finger pain, right 09/29/2014   S/P coronary artery stent placement 12/20/2012   Old myocardial infarction 10/27/2010    Immunization History  Administered Date(s) Administered   Janssen (J&J) SARS-COV-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 12/04/2019   PFIZER(Purple Top)SARS-COV-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 06/10/2020    Conditions to be addressed/monitored: HTN, HLD, and DMII  There are no care plans that you recently modified to display for this patient.         Medication Assistance: Patient assistance for toujeo (Sanofi PAP) approved 12/28/20, medication delivered to primary care office 01/06/21, 90DS will continue to be delivered. Recertification of application for 8315 year can be completed July 22, 2021.  Patient's preferred pharmacy is:  [id number] - [place], [address] [place] [address] Phone: [phone] Fax: [phone]  [place], [place] [address] Phone: [phone] Fax: [phone]   Uses pill box? Yes Pt endorses 100% compliance  Follow Up:  Patient agrees to Care Plan and Follow-up.  Plan: Telephone follow up appointment with care management team member scheduled for:  1 month  Larinda Buttery, PharmD Clinical Pharmacist [facility] At [facility] [phone]

## 2022-06-22 ENCOUNTER — Telehealth: Payer: Self-pay | Admitting: Pharmacist

## 2022-06-22 NOTE — Telephone Encounter (Signed)
Pharmacy Documentation:  Contacted [employer] for status update of Farxiga patient assistance.  Patient application: denied due to an audit process claiming patient has Pharmacist, community.  Sent across a copy of patient insurance, letterhead from our office stating this is patient's only insurance, and request for reconsideration for Farxiga patient assistance eligibility.   Faxed 06/22/22 and dropped into folder for scanning into media tab.   Await company response.   Larinda Buttery, PharmD Clinical Pharmacist [facility] Primary [facility] At [facility] [phone]

## 2022-07-13 ENCOUNTER — Telehealth: Payer: Self-pay | Admitting: Pharmacist

## 2022-07-13 NOTE — Telephone Encounter (Signed)
Pharmacy Documentation  Patient presented to office today with [employer] re-enrollment forms for [facility] patient assistance.  Completed forms, faxed, and now await response from company.  He states he is almost out of toujeo, about 2 injections left. Provided a sample of tresiba to bridge him through until [facility] is processed. Currently using toujeo 30 units nightly, advised patient to try 20 units of tresiba during change of long-acting insulin, but can go back to 30 units the following night, based on blood sugar readings.  Patient was counseled to avoid BG <70, and goal range 130-180 with food, or <130 in mornings.   Will follow up with patient in several weeks, as he should also be starting farxiga soon, mailed to his house from patient assistance. Counseled patient on new medication.  Larinda Buttery, PharmD Clinical Pharmacist [facility] Primary Care At [facility] [phone]

## 2022-07-15 ENCOUNTER — Ambulatory Visit (INDEPENDENT_AMBULATORY_CARE_PROVIDER_SITE_OTHER): Payer: Medicare HMO | Admitting: Podiatrist

## 2022-07-15 ENCOUNTER — Encounter: Payer: Self-pay | Admitting: Podiatrist

## 2022-07-15 DIAGNOSIS — M79675 Pain in left toe(s): Secondary | ICD-10-CM

## 2022-07-15 DIAGNOSIS — M79674 Pain in right toe(s): Secondary | ICD-10-CM | POA: Diagnosis not present

## 2022-07-15 DIAGNOSIS — B351 Tinea unguium: Secondary | ICD-10-CM

## 2022-07-20 NOTE — Progress Notes (Signed)
  Subjective:  Patient ID: Joseph Grimes, male    DOB: [birth year],   MRN: [id number]  Chief Complaint  Patient presents with   Nail Problem     Routine foot care      61 y.o. male presents for follow-up  routine foot care. Relates painful thickened and elongated toenails he is unable to trim himself.  Third digit wound doing well and has continued to stay healed.  Denies pain or any issues. Denies drainage.  Last A1c 8.4 on 06/03/22  was  Denies any other pedal complaints. Denies n/v/f/c.   PCP: Samuel Bouche NP   Past Medical History:  Diagnosis Date   CKD (chronic kidney disease) stage 3, GFR 30-59 ml/min ([place]) 10/11/2018   Coronary artery disease 10/11/2018   Diabetes ([place])    Diabetes mellitus type 2, controlled, with complications ([place]) 09/12/4980   Essential hypertension 10/11/2018   GERD (gastroesophageal reflux disease)    Heart attack ([place]) 2006   stent placed in right coronary artery   High cholesterol    History of anemia 10/11/2018   History of low potassium    Low hemoglobin    Mixed hyperlipidemia 10/11/2018   Peripheral vascular disease ([place]) 10/11/2018   Stroke ([place])    Tobacco dependence 10/11/2018    Objective:  Physical Exam: Vascular: DP/PT pulses 2/4 bilateral. CFT <3 seconds. Absent hair growth and xerosis noted to bilateral lower extremity., . No edema.  Skin. No lacerations or abrasions bilateral feet. Plantar third digit wound healed mild callus overlying. Hyperkeratotic tissue noted to posterior left heel. Nails 1-5 on left and 1 ,4,5 on right are thickened elongated and discolored with subungual debris.   Musculoskeletal: MMT 5/5 bilateral lower extremities in DF, PF, Inversion and Eversion. Deceased ROM in DF of ankle joint. 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  Neurological: Sensation intact to light touch.   Assessment:     ICD-10-CM   1. Pain due to onychomycosis of toenails of both feet  B35.1    M79.675    M79.674         Plan:  Patient  was evaluated and treated and all questions answered. Ulcer left third digit healed.   -Discussed supportive shoes at all times and checking feet regularly.  -Mechanically debrided all nails 1-5 bilateral using sterile nail nipper and filed with dremel without incident  -Answered all patient questions -Patient to return  in 3 months for at risk foot care -Patient advised to call the office if any problems or questions arise in the meantime.

## 2022-07-26 ENCOUNTER — Ambulatory Visit (INDEPENDENT_AMBULATORY_CARE_PROVIDER_SITE_OTHER): Payer: Medicare HMO | Admitting: Pharmacist

## 2022-07-26 DIAGNOSIS — E782 Mixed hyperlipidemia: Secondary | ICD-10-CM

## 2022-07-26 DIAGNOSIS — Z794 Long term (current) use of insulin: Secondary | ICD-10-CM

## 2022-07-26 DIAGNOSIS — I10 Essential (primary) hypertension: Secondary | ICD-10-CM

## 2022-07-26 DIAGNOSIS — E1140 Type 2 diabetes mellitus with diabetic neuropathy, unspecified: Secondary | ICD-10-CM

## 2022-07-26 NOTE — Progress Notes (Unsigned)
Chronic Care Management Pharmacy Note  07/26/2022 Name:  Joseph Grimes MRN:  [id number] DOB:  [birth year]  Summary:  addressed HTN, HLD, and primarily DM. Patient  in process of obtaining farxiga using patient assistance. Taking 34 units of toujeo.  Received farxiga in mail, was afraid to start taking it.  Farxiga 103m daily and tresiba 10 units   Per patient, his [place]elva [place] approved and we are awaiting shipment. Needs pen needles to [facility]  BG 78, 104, 142, 154, 108, 145, 154 BG AM: 98, 110, 73, 108, 84, 103, 123, 96, 63, 78  Tresiba (via sample) - 20 units   BG fasting 98, 88, 59, 92. BG afternoon 104, 137, 118   Recommendations/Changes made from today's visit:  - Farxiga patient assistance: rejected due to insurance audit that picked up a commercial plan for patient. Will submit copy of his insurance card and letterhead from our office as proof of insurance to resubmit.  - No medication changes at this time  Plan: f/u with pharmacist in 1 month  Subjective: Joseph HACKLERis an 61y.o. year old male who is a primary patient of Joseph Bouche NP.  The CCM team was consulted for assistance with disease management and care coordination needs.    Engaged with patient by telephone for follow up visit in response to provider referral for pharmacy case management and/or care coordination services.   Consent to Services:  The patient was given information about Chronic Care Management services, agreed to services, and gave verbal consent prior to initiation of services.  Please see initial visit note for detailed documentation.   Patient Care Team: Joseph Bouche NP as PCP - General (Nurse Practitioner) Joseph Grimes RD[facility] Centeras Pharmacist (Pharmacist)  Objective:  Lab Results  Component Value Date   CREATININE 2.97 (H) 03/03/2022   CREATININE 2.24 (H) 12/02/2021   CREATININE 1.94 (H) 12/23/2020    Lab Results  Component Value Date   HGBA1C 8.4 (A) 06/03/2022    Last diabetic Eye exam:  Lab Results  Component Value Date/Time   HMDIABEYEEXA Retinopathy (A) 06/16/2021 12:00 AM       Component Value Date/Time   CHOL 101 12/02/2021 0000   TRIG 75 12/02/2021 0000   HDL 48 12/02/2021 0000   CHOLHDL 2.1 12/02/2021 0000   LDLCALC 37 12/02/2021 0000       Latest Ref Rng & Units 12/02/2021   12:00 AM 12/23/2020   12:18 PM 11/26/2019   11:14 AM  Hepatic Function  Total Protein 6.1 - 8.1 g/dL 6.1  6.1  5.9   AST 10 - 35 U/L _0 ALT 9 - 46 U/L _1 Total Bilirubin 0.2 - 1.2 mg/dL 0.4  0.3  0.4     Lab Results  Component Value Date/Time   TSH 1.25 11/26/2019 11:14 AM       Latest Ref Rng & Units 03/03/2022   12:00 AM 12/02/2021   12:00 AM 11/26/2019   11:14 AM  CBC  WBC 3.8 - 10.8 Thousand/uL 9.8  11.8  11.6   Hemoglobin 13.2 - 17.1 g/dL 13.9  13.6  8.5   Hematocrit 38.5 - 50.0 % 40.9  41.0  26.7   Platelets 140 - 400 Thousand/uL 214  229  397     Lab Results  Component Value Date/Time   VD25OH 8 (L) 12/27/2018 11:56 AM     Social History   Tobacco Use  Smoking Status Every Day   Packs/day: 0.50   Types: Cigarettes  Smokeless Tobacco Never  Tobacco Comments   refused   BP Readings from Last 3 Encounters:  06/03/22 131/71  03/03/22 121/73  01/12/22 (!) 113/54   Pulse Readings from Last 3 Encounters:  06/03/22 87  03/03/22 93  01/12/22 69   Wt Readings from Last 3 Encounters:  06/03/22 156 lb (70.8 kg)  03/03/22 155 lb 0.6 oz (70.3 kg)  12/02/21 153 lb 0.6 oz (69.4 kg)    Assessment: Review of patient past medical history, allergies, medications, health status, including review of consultants reports, laboratory and other test data, was performed as part of comprehensive evaluation and provision of chronic care management services.   SDOH:  (Social Determinants of Health) assessments and interventions performed:  SDOH Interventions    Flowsheet Row Clinical Support from 10/06/2021 in [place] Management from 12/23/2020 in [place] Office Visit from 07/23/2020 in [place] Interventions Intervention Not Indicated -- Intervention Not Indicated  Housing Interventions Intervention Not Indicated -- Intervention Not Indicated  Transportation Interventions Intervention Not Indicated -- Intervention Not Indicated  Financial Strain Interventions Intervention Not Indicated Other (Comment)  [patient assistance program for medication] --  Physical Activity Interventions Intervention Not Indicated -- Intervention Not Indicated  Stress Interventions Intervention Not Indicated -- Intervention Not Indicated  Social Connections Interventions Intervention Not Indicated -- Intervention Not Indicated       CCM Care Plan  Allergies  Allergen Reactions   Ozempic (0.25 Or 0.5 Mg-Dose) [Semaglutide(0.25 Or 0.36m-Dos)] Diarrhea    Medications Reviewed Today     Reviewed by Joseph Grimes DPM (Physician) on 07/15/22 at 1057  Med List Status: <None>   Medication Order Taking? Sig Documenting Provider Last Dose Status Informant  Accu-Chek Softclix Lancets lancets [id number]  [provider] Taking Active   aspirin 81 MG tablet [id number] Take 1 tablet (81 mg total) by mouth daily. Joseph Reeve DO Taking Active   blood glucose meter kit and supplies [id number] Dispense based on patient and insurance preference. Use up to four times daily as directed. (FOR ICD-10 E10.9, E11.9). Joseph Reeve DO Taking Active   cilostazol (PLETAL) 100 MG tablet [id number] TAKE 1 TABLET TWICE DAILY Joseph Bouche NP Taking Active   FEROSUL 325 (65 Fe) MG tablet [id number] TAKE 1 TABLET TWICE DAILY WITH MEALS Joseph Reeve DO Taking Active   furosemide (LASIX) 20 MG tablet [id number] TAKE 1 TABLET EVERY DAY Jessup, Joy,  NP Taking Active   insulin glargine, 2 Unit Dial, (TOUJEO MAX SOLOSTAR) 300 UNIT/ML Solostar Pen [id number] Inject 16 Units into the skin at bedtime. Increase by 4 units every 4 days until fasting (morning) blood sugar is <150, then stay at that dose.  Patient taking differently: Inject 35 Units into the skin at bedtime. Increase by 4 units every 4 days until fasting (morning) blood sugar is <150, then stay at that dose. (Taking 35 units daily as of 06/23/21)   Joseph Reeve DO Taking Active            Med Note (Dorene ArAug 17, 2022 10:11 AM) Taking 42 units as of 03/24/21   Insulin Pen Needle 29G X 12.7MM MISC [id number] 100 each by Does not apply route daily. Please dispense  qty, brand, appropriate pen sizing for [employer], Caryl Asp, NP Taking Active   MODERNA COVID-19 BIVAL BOOSTER 50 MCG/0.5ML injection [id number] No  [provider] Taking Active   Oxycodone HCl 10 MG TABS [id number]  Take 0.5-1 tablets (5-10 mg total) by mouth every 6 (six) hours as needed. Next refill due 08/02/2022 Samuel Bouche, NP  Active   pantoprazole (PROTONIX) 40 MG tablet [id number] No TAKE 1 TABLET EVERY DAY Samuel Bouche, NP Taking Active   potassium chloride (KLOR-CON M) 10 MEQ tablet [id number] No TAKE 1 TABLET EVERY DAY Jessup, Joy, NP Taking Active   rosuvastatin (CRESTOR) 40 MG tablet [id number] No TAKE 1 TABLET (40 MG TOTAL) BY MOUTH DAILY. OFFICE VISIT REQUIRED PRIOR TO ANY FURTHER REFILLS Samuel Bouche, NP Taking Active   TRUE METRIX BLOOD GLUCOSE TEST test strip [id number] No TEST UP TO FOUR TIMES DAILY AS DIRECTED Samuel Bouche, NP Taking Active   valsartan (DIOVAN) 40 MG tablet [id number] No Take 1 tablet (40 mg total) by mouth daily. Samuel Bouche, NP Taking Active             Patient Active Problem List   Diagnosis Date Noted   Abnormal renal function 03/02/2022   Leukocytosis 03/02/2022   Chronic, continuous use of opioids 03/02/2022   Pilonidal cyst 11/25/2019   AKI  (acute kidney injury) ([place]) 11/03/2019   Hyponatremia 11/03/2019   Sepsis ([place]) 11/03/2019   Weakness 11/03/2019   Osteomyelitis of finger of left hand ([place])    Vitamin D deficiency 12/28/2018   Erectile dysfunction 12/28/2018   PVC (premature ventricular contraction) 12/27/2018   Essential hypertension 10/11/2018   Coronary artery disease 10/11/2018   Peripheral vascular disease ([place]) 10/11/2018   Mixed hyperlipidemia 10/11/2018   Osteoarthritis 10/11/2018   CKD (chronic kidney disease) stage 4, GFR 15-29 ml/min (HCC) 10/11/2018   History of anemia 10/11/2018   Other chronic pain 06/08/2018   Vaccine refused by patient 06/07/2018   Abnormal stress test 04/10/2018   Non-traumatic rhabdomyolysis 02/01/2018   Hypokalemia 02/01/2018   Hypophosphatemia 02/01/2018   Perianal abscess 10/16/2017   Nicotine dependence with current use 10/12/2017   Perirectal abscess 10/12/2017   Drug-induced constipation 10/12/2017   GI bleed 09/15/2017   Elevated troponin 06/15/2017   Chronic diastolic heart failure ([place]) 06/15/2017   Normocytic anemia 06/15/2017   Type 2 diabetes mellitus with diabetic neuropathy, with long-term current use of insulin ([place]) 04/24/2017   History of cerebrovascular accident 09/20/2016   Bone lesion 09/29/2014   Finger pain, right 09/29/2014   S/P coronary artery stent placement 12/20/2012   Old myocardial infarction 10/27/2010    Immunization History  Administered Date(s) Administered   Janssen (J&J) SARS-COV-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 12/04/2019   PFIZER(Purple Top)SARS-COV-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 06/10/2020    Conditions to be addressed/monitored: HTN, HLD, and DMII  There are no care plans that you recently modified to display for this patient.         Medication Assistance: Patient assistance for toujeo (Sanofi PAP) approved 12/28/20, medication delivered to primary care office 01/06/21, 90DS will continue to be delivered. Recertification of application for 6144 year can be  completed July 22, 2021.  Patient's preferred pharmacy is:  [id number] - [place], [place] [address] Phone: [phone] Fax: [phone]  [facility] - [address] [place] [address] Phone: [phone] Fax: [phone]  Uses pill box? Yes Pt endorses 100% compliance  Follow Up:  Patient agrees to Care Plan and Follow-up.  Plan: Telephone follow up appointment with care management team member scheduled for:  1 month  Larinda Buttery, PharmD Clinical Pharmacist [facility] Primary Care At [facility] [phone]

## 2022-07-27 MED ORDER — DAPAGLIFLOZIN PROPANEDIOL 5 MG PO TABS
5.0000 mg | ORAL_TABLET | Freq: Every day | ORAL | 3 refills | Status: DC
Start: 2022-07-27 — End: 2022-07-27

## 2022-07-27 MED ORDER — INSULIN PEN NEEDLE 29G X 12.7MM MISC: 100.0000 | Freq: Every day | 2 refills | Status: DC

## 2022-07-27 NOTE — Addendum Note (Signed)
Addended bySamuel Bouche on: 07/27/2022 12:56 PM   Modules accepted: Orders

## 2022-08-03 ENCOUNTER — Ambulatory Visit (INDEPENDENT_AMBULATORY_CARE_PROVIDER_SITE_OTHER): Payer: Medicare HMO | Admitting: Pharmacist

## 2022-08-03 DIAGNOSIS — E118 Type 2 diabetes mellitus with unspecified complications: Secondary | ICD-10-CM

## 2022-08-03 MED ORDER — INSULIN PEN NEEDLE 29G X 12.7MM MISC: 100.0000 | Freq: Every day | 2 refills | Status: DC

## 2022-08-03 NOTE — Addendum Note (Signed)
Addended bySamuel Bouche on: 08/03/2022 12:45 PM   Modules accepted: Orders

## 2022-08-03 NOTE — Progress Notes (Signed)
Chronic Care Management Pharmacy Note  08/03/2022 Name:  Joseph Grimes MRN:  [id number] DOB:  16-Jul-1961  Summary:  addressed HTN, HLD, and primarily DM.  Patient is currently taking tresiba via sample (obtains toujeo via [employer] patient assistance, 32 units, but ran out and is awaiting company shipment). He is temporarily taking 10 units nightly out of caution as we started new farxiga, as he wanted to be sure to avoid hypoglycemia.  He is not having any concerns or side effects with new farxiga, going well.  Per patient, his Toujeo through [employer] is approved and we are awaiting shipment. Needs pen needles to [facility]  BG fasting 150 this morning, a little higher around Christmas food, per patient.  Recommendations/Changes made from today's visit:  - Recommend patient continue farxiga 48m daily.  - Recommend patient increase to 14 units nightly of tresiba, can increase back by 4 units every few days to achieve fasting BG <130 - Patient [facility]  Plan: f/u with pharmacist in 2 months  Subjective: Joseph TEATERis an 61y.o. year old male who is a primary patient of JSamuel Bouche NP.  The CCM team was consulted for assistance with disease management and care coordination needs.    Engaged with patient by telephone for follow up visit in response to provider referral for pharmacy case management and/or care coordination services.   Consent to Services:  The patient was given information about Chronic Care Management services, agreed to services, and gave verbal consent prior to initiation of services.  Please see initial visit note for detailed documentation.   Patient Care Team: JSamuel Bouche NP as PCP - General (Nurse Practitioner) KDarius Bump [facility]as Pharmacist (Pharmacist)  Objective:  Lab Results  Component Value Date   CREATININE 2.97 (H) 03/03/2022   CREATININE 2.24 (H) 12/02/2021   CREATININE 1.94 (H) 12/23/2020    Lab  Results  Component Value Date   HGBA1C 8.4 (A) 06/03/2022   Last diabetic Eye exam:  Lab Results  Component Value Date/Time   HMDIABEYEEXA Retinopathy (A) 06/16/2021 12:00 AM       Component Value Date/Time   CHOL 101 12/02/2021 0000   TRIG 75 12/02/2021 0000   HDL 48 12/02/2021 0000   CHOLHDL 2.1 12/02/2021 0000   LDLCALC 37 12/02/2021 0000       Latest Ref Rng & Units 12/02/2021   12:00 AM 12/23/2020   12:18 PM 11/26/2019   11:14 AM  Hepatic Function  Total Protein 6.1 - 8.1 g/dL 6.1  6.1  5.9   AST 10 - 35 U/L _0 ALT 9 - 46 U/L _1 Total Bilirubin 0.2 - 1.2 mg/dL 0.4  0.3  0.4     Lab Results  Component Value Date/Time   TSH 1.25 11/26/2019 11:14 AM       Latest Ref Rng & Units 03/03/2022   12:00 AM 12/02/2021   12:00 AM 11/26/2019   11:14 AM  CBC  WBC 3.8 - 10.8 Thousand/uL 9.8  11.8  11.6   Hemoglobin 13.2 - 17.1 g/dL 13.9  13.6  8.5   Hematocrit 38.5 - 50.0 % 40.9  41.0  26.7   Platelets 140 - 400 Thousand/uL 214  229  397     Lab Results  Component Value Date/Time   VD25OH 8 (L) 12/27/2018 11:56 AM     Social History   Tobacco Use  Smoking Status Every Day   Packs/day: 0.50   Types: Cigarettes  Smokeless Tobacco Never  Tobacco Comments   refused   BP Readings from Last 3 Encounters:  06/03/22 131/71  03/03/22 121/73  01/12/22 (!) 113/54   Pulse Readings from Last 3 Encounters:  06/03/22 87  03/03/22 93  01/12/22 69   Wt Readings from Last 3 Encounters:  06/03/22 156 lb (70.8 kg)  03/03/22 155 lb 0.6 oz (70.3 kg)  12/02/21 153 lb 0.6 oz (69.4 kg)    Assessment: Review of patient past medical history, allergies, medications, health status, including review of consultants reports, laboratory and other test data, was performed as part of comprehensive evaluation and provision of chronic care management services.   SDOH:  (Social Determinants of Health) assessments and interventions performed:  SDOH Interventions     Flowsheet Row Clinical Support from 10/06/2021 in [place] Management from 12/23/2020 in [place] Office Visit from 07/23/2020 in [place] Interventions Intervention Not Indicated -- Intervention Not Indicated  Housing Interventions Intervention Not Indicated -- Intervention Not Indicated  Transportation Interventions Intervention Not Indicated -- Intervention Not Indicated  Financial Strain Interventions Intervention Not Indicated Other (Comment)  [patient assistance program for medication] --  Physical Activity Interventions Intervention Not Indicated -- Intervention Not Indicated  Stress Interventions Intervention Not Indicated -- Intervention Not Indicated  Social Connections Interventions Intervention Not Indicated -- Intervention Not Indicated       CCM Care Plan  Allergies  Allergen Reactions   Ozempic (0.25 Or 0.5 Mg-Dose) [Semaglutide(0.25 Or 0.69m-Dos)] Diarrhea    Medications Reviewed Today     Reviewed by KDarius Bump [facility](Pharmacist) on 07/27/22 at 0[place] SpringsList Status: <None>   Medication Order Taking? Sig Documenting Provider Last Dose Status Informant  Accu-Chek Softclix Lancets lancets [id number]  [provider]  Active   aspirin 81 MG tablet [id number] Take 1 tablet (81 mg total) by mouth daily. AEmeterio Reeve DO  Active   blood glucose meter kit and supplies [id number] Dispense based on patient and insurance preference. Use up to four times daily as directed. (FOR ICD-10 E10.9, E11.9). AEmeterio Reeve DO  Active   cilostazol (PLETAL) 100 MG tablet [id number] TAKE 1 TABLET TWICE DAILY JSamuel Bouche NP  Active   FEROSUL 325 (65 Fe) MG tablet [id number] TAKE 1 TABLET TWICE DAILY WITH MEALS AEmeterio Reeve DO  Active   furosemide (LASIX) 20 MG tablet [id number] TAKE 1 TABLET  EVERY DAY Jessup, Joy, NP  Active   insulin glargine, 2 Unit Dial, (TOUJEO MAX SOLOSTAR) 300 UNIT/ML Solostar Pen [id number] Inject 16 Units into the skin at bedtime. Increase by 4 units every 4 days until fasting (morning) blood sugar is <150, then stay at that dose.  Patient taking differently: Inject 35 Units into the skin at bedtime. Increase by 4 units every 4 days until fasting (morning) blood sugar is <150, then stay at that dose. (Taking 35 units daily as of 06/23/21)   AEmeterio Reeve DO  Active            Med Note (Dorene ArAug 17, 2022 10:11 AM) Taking 42 units as of 03/24/21   Insulin Pen Needle 29G X 12.7MM MISC [id number] 100 each by Does not apply route daily. Please dispense  qty, brand, appropriate pen sizing for toujeo Lehman Prom, Caryl Asp, NP  Active   MODERNA COVID-19 BIVAL BOOSTER 50 MCG/0.5ML injection [id number]   [provider]  Active   Oxycodone HCl 10 MG TABS [id number]  Take 0.5-1 tablets (5-10 mg total) by mouth every 6 (six) hours as needed. Next refill due 08/02/2022 Samuel Bouche, NP  Active   pantoprazole (PROTONIX) 40 MG tablet [id number]  TAKE 1 TABLET EVERY DAY Samuel Bouche, NP  Active   potassium chloride (KLOR-CON M) 10 MEQ tablet [id number]  TAKE 1 TABLET EVERY DAY Jessup, Joy, NP  Active   rosuvastatin (CRESTOR) 40 MG tablet [id number]  TAKE 1 TABLET (40 MG TOTAL) BY MOUTH DAILY. OFFICE VISIT REQUIRED PRIOR TO ANY FURTHER REFILLS Samuel Bouche, NP  Active   TRUE METRIX BLOOD GLUCOSE TEST test strip [id number]  TEST UP TO FOUR TIMES DAILY AS DIRECTED Samuel Bouche, NP  Active   valsartan (DIOVAN) 40 MG tablet [id number]  Take 1 tablet (40 mg total) by mouth daily. Samuel Bouche, NP  Active             Patient Active Problem List   Diagnosis Date Noted   Abnormal renal function 03/02/2022   Leukocytosis 03/02/2022   Chronic, continuous use of opioids 03/02/2022   Pilonidal cyst 11/25/2019   AKI (acute kidney injury) ([place]) 11/03/2019    Hyponatremia 11/03/2019   Sepsis ([place]) 11/03/2019   Weakness 11/03/2019   Osteomyelitis of finger of left hand ([place])    Vitamin D deficiency 12/28/2018   Erectile dysfunction 12/28/2018   PVC (premature ventricular contraction) 12/27/2018   Essential hypertension 10/11/2018   Coronary artery disease 10/11/2018   Peripheral vascular disease ([place]) 10/11/2018   Mixed hyperlipidemia 10/11/2018   Osteoarthritis 10/11/2018   CKD (chronic kidney disease) stage 4, GFR 15-29 ml/min (HCC) 10/11/2018   History of anemia 10/11/2018   Other chronic pain 06/08/2018   Vaccine refused by patient 06/07/2018   Abnormal stress test 04/10/2018   Non-traumatic rhabdomyolysis 02/01/2018   Hypokalemia 02/01/2018   Hypophosphatemia 02/01/2018   Perianal abscess 10/16/2017   Nicotine dependence with current use 10/12/2017   Perirectal abscess 10/12/2017   Drug-induced constipation 10/12/2017   GI bleed 09/15/2017   Elevated troponin 06/15/2017   Chronic diastolic heart failure ([place]) 06/15/2017   Normocytic anemia 06/15/2017   Type 2 diabetes mellitus with diabetic neuropathy, with long-term current use of insulin ([place]) 04/24/2017   History of cerebrovascular accident 09/20/2016   Bone lesion 09/29/2014   Finger pain, right 09/29/2014   S/P coronary artery stent placement 12/20/2012   Old myocardial infarction 10/27/2010    Immunization History  Administered Date(s) Administered   Janssen (J&J) SARS-COV-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 12/04/2019   PFIZER(Purple Top)SARS-COV-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 06/10/2020    Conditions to be addressed/monitored: HTN, HLD, and DMII  There are no care plans that you recently modified to display for this patient.         Medication Assistance: Patient assistance for toujeo ([employer] PAP) approved 12/28/20, medication delivered to primary care office 01/06/21, 90DS will continue to be delivered. Recertification of application for 2924 year can be completed July 22, 2021.  Patient's  preferred pharmacy is:  [id number] - [place], [place] [address] [place] [address] Phone: [phone] Fax: [phone]  [facility] - [address] [place] [address] Phone: [phone] Fax: [phone]  Uses pill box? Yes Pt endorses 100% compliance  Follow Up:  Patient agrees to Care Plan and Follow-up.  Plan: Telephone follow up appointment with care management team member scheduled for:  2 months  Larinda Buttery, PharmD Clinical Pharmacist [facility] [phone]

## 2022-08-04 ENCOUNTER — Other Ambulatory Visit: Payer: Self-pay | Admitting: Medical-Surgical

## 2022-08-19 ENCOUNTER — Other Ambulatory Visit: Payer: Self-pay

## 2022-08-19 DIAGNOSIS — F1190 Opioid use, unspecified, uncomplicated: Secondary | ICD-10-CM

## 2022-08-19 NOTE — Progress Notes (Signed)
Refill declined.  30 tablets are to last 90 days.  Last prescription refill was 06/03/2022.  Next due 09/02/2022.

## 2022-08-19 NOTE — Progress Notes (Signed)
Patient called requesting a refill on his Oxycodone. Send to [facility]  Last office visit 06/03/2022  Last filled 06/03/2022  Upcoming appointment 09/05/2022

## 2022-08-23 NOTE — Progress Notes (Signed)
Attempted call to patient. Left voice mail message requesting a return call.  

## 2022-08-23 NOTE — Progress Notes (Signed)
Patient informed. 

## 2022-09-05 ENCOUNTER — Ambulatory Visit: Payer: Medicare HMO | Admitting: Medical-Surgical

## 2022-09-06 ENCOUNTER — Other Ambulatory Visit: Payer: Self-pay

## 2022-09-06 DIAGNOSIS — F1190 Opioid use, unspecified, uncomplicated: Secondary | ICD-10-CM

## 2022-09-06 NOTE — Progress Notes (Signed)
Patient last office visit 06/03/2022  Patients appointment canceled 01/29 due to PCP out of the office.  Last filled 06/03/2022

## 2022-09-07 MED ORDER — OXYCODONE HCL 10 MG PO TABS: 5.0000 mg | ORAL_TABLET | Freq: Four times a day (QID) | ORAL | 0 refills | Status: DC | PRN

## 2022-09-07 NOTE — Progress Notes (Signed)
Refill sent to [facility].  

## 2022-09-15 ENCOUNTER — Ambulatory Visit (INDEPENDENT_AMBULATORY_CARE_PROVIDER_SITE_OTHER): Payer: Medicare HMO | Admitting: Medical-Surgical

## 2022-09-15 ENCOUNTER — Encounter: Payer: Self-pay | Admitting: Medical-Surgical

## 2022-09-15 VITALS — BP 110/65 | HR 83 | Resp 20 | Ht 69.0 in | Wt 154.4 lb

## 2022-09-15 DIAGNOSIS — I10 Essential (primary) hypertension: Secondary | ICD-10-CM

## 2022-09-15 DIAGNOSIS — E782 Mixed hyperlipidemia: Secondary | ICD-10-CM | POA: Diagnosis not present

## 2022-09-15 DIAGNOSIS — Z794 Long term (current) use of insulin: Secondary | ICD-10-CM | POA: Diagnosis not present

## 2022-09-15 DIAGNOSIS — G8929 Other chronic pain: Secondary | ICD-10-CM | POA: Diagnosis not present

## 2022-09-15 DIAGNOSIS — N184 Chronic kidney disease, stage 4 (severe): Secondary | ICD-10-CM | POA: Diagnosis not present

## 2022-09-15 DIAGNOSIS — E1140 Type 2 diabetes mellitus with diabetic neuropathy, unspecified: Secondary | ICD-10-CM

## 2022-09-15 LAB — POCT GLYCOSYLATED HEMOGLOBIN (HGB A1C): Hemoglobin A1C: 9.2 % — AB (ref 4.0–5.6)

## 2022-09-15 NOTE — Progress Notes (Signed)
Established Patient Office Visit  Subjective   Patient ID: Joseph Grimes, male   DOB: Dec 22, 1960 Age: 62 y.o. MRN: [id number]   Chief Complaint  Patient presents with   Follow-up   Diabetes    HPI 62 year old male presenting today for the following:  Diabetes: Has been checking blood sugars twice daily on average.  Notes that most of his readings are between 100-120.  Admits that he is not being as adherent to a diabetic diet as he probably should be.  Taking Farxiga 5 mg daily and Toujeo 22 units daily as prescribed, tolerating well without side effects.  Hypertension: Not checking blood pressures at home.  Losartan 40 mg daily and furosemide 20 mg daily.  Tolerating both medications well without side effects.  Following low-sodium diet.  No regular intentional exercise. Denies CP, SOB, palpitations, lower extremity edema, dizziness, headaches, or vision changes.  Hyperlipidemia: Taking rosuvastatin 40 mg daily, tolerating well without side effects.  Pain: Continues to use oxycodone 5-10 mg every 6 hours as needed.  Has done well with the limitation of 10 tablets per 30 days (30 tablets per 90-day supply).   Objective:    Vitals:   09/15/22 0957 09/15/22 0958  BP: 110/65 110/65  Pulse: 83 83  Resp: 20   Height: 5\' 9"  (1.753 m)   Weight: 154 lb 6.4 oz (70 kg)   SpO2: 97% 97%  BMI (Calculated): 22.79     Physical Exam Vitals reviewed.  Constitutional:      General: He is not in acute distress.    Appearance: Normal appearance. He is not ill-appearing.  HENT:     Head: Normocephalic and atraumatic.  Cardiovascular:     Rate and Rhythm: Normal rate and regular rhythm.     Pulses: Normal pulses.     Heart sounds: Normal heart sounds. No murmur heard.    No friction rub. No gallop.  Pulmonary:     Effort: Pulmonary effort is normal. No respiratory distress.     Breath sounds: Normal breath sounds.  Skin:    General: Skin is warm and dry.  Neurological:     Mental  Status: He is alert and oriented to person, place, and time.  Psychiatric:        Mood and Affect: Mood normal.        Behavior: Behavior normal.        Thought Content: Thought content normal.        Judgment: Judgment normal.   No results found for this or any previous visit (from the past 24 hour(s)).     The ASCVD Risk score (Arnett DK, et al., 2019) failed to calculate for the following reasons:   The patient has a prior MI or stroke diagnosis   Assessment & Plan:   1. Type 2 diabetes mellitus with diabetic neuropathy, with long-term current use of insulin (HCC) POCT hemoglobin A1c A1c previously 8.4% however it has gone up to 9.2%.  This is not consistent with his typical readings on glucose checks however I feel that this is directly related to dietary choices that he is making.  Continue Toujeo as prescribed with titration as needed.  Recommend increasing Farxiga to 10 mg daily.  Will touch base with clinical pharmacy since he gets this medication through the manufacturer for no cost. - POCT HgB A1C  2. CKD (chronic kidney disease) stage 4, GFR 15-29 ml/min (HCC) Rechecking CMP.   - COMPLETE METABOLIC PANEL WITH GFR  3. Mixed  hyperlipidemia Continue Crestor 40 mg daily.  4. Other chronic pain Continue very sparing use of oxycodone for chronic pain.  5.  Essential hypertension Blood pressure looks great today and is well-controlled on current regimen.  Continue valsartan and furosemide as prescribed.   Return in about 3 months (around 12/14/2022) for DM follow up.  ___________________________________________ Clearnce Sorrel, DNP, APRN, FNP-BC [facility] [place]

## 2022-09-16 ENCOUNTER — Telehealth: Payer: Self-pay | Admitting: Pharmacist

## 2022-09-16 LAB — COMPLETE METABOLIC PANEL WITH GFR
AG Ratio: 1.4 (calc) (ref 1.0–2.5)
ALT: 8 U/L — ABNORMAL LOW (ref 9–46)
AST: 9 U/L — ABNORMAL LOW (ref 10–35)
Albumin: 3.9 g/dL (ref 3.6–5.1)
Alkaline phosphatase (APISO): 164 U/L — ABNORMAL HIGH (ref 35–144)
BUN/Creatinine Ratio: 8 (calc) (ref 6–22)
BUN: 29 mg/dL — ABNORMAL HIGH (ref 7–25)
CO2: 22 mmol/L (ref 20–32)
Calcium: 9.3 mg/dL (ref 8.6–10.3)
Chloride: 109 mmol/L (ref 98–110)
Creat: 3.77 mg/dL — ABNORMAL HIGH (ref 0.70–1.35)
Globulin: 2.8 g/dL (calc) (ref 1.9–3.7)
Glucose, Bld: 141 mg/dL — ABNORMAL HIGH (ref 65–99)
Potassium: 5.3 mmol/L (ref 3.5–5.3)
Sodium: 138 mmol/L (ref 135–146)
Total Bilirubin: 0.4 mg/dL (ref 0.2–1.2)
Total Protein: 6.7 g/dL (ref 6.1–8.1)
eGFR: 17 mL/min/{1.73_m2} — ABNORMAL LOW (ref >=60)

## 2022-09-16 NOTE — Progress Notes (Signed)
Care Coordination Call  Patient is on farxiga 54m daily via [employer] patient assistance. He had a recent visit to PCP in which renal function declined, A1c uptrend. Discussed with provider increase of farxiga to 13mdaily is ideal for CKD prevention and further decline, though may not provide as much A1c lowering as desired.  Facilitated increase dose by contacting [employer] to provide verbal order- pharmacist line busy, a representative recommended placing eRX to [facility] pharmacy for the dose increase.  Pended Farxiga 1032maily to PCP encounter, coordinated with PCP for signoff.  Will follow patient for continued monitoring and optimizing glycemic control.   KeeLarinda ButteryharmD Clinical Pharmacist [phone]

## 2022-09-16 NOTE — Addendum Note (Signed)
Addended bySamuel Bouche on: 09/16/2022 11:59 AM   Modules accepted: Orders

## 2022-09-16 NOTE — Addendum Note (Signed)
Addended by: Darius Bump on: 09/16/2022 12:43 PM   Modules accepted: Orders

## 2022-10-03 ENCOUNTER — Other Ambulatory Visit: Payer: Self-pay | Admitting: Medical-Surgical

## 2022-10-07 ENCOUNTER — Other Ambulatory Visit: Payer: Medicare HMO | Admitting: Pharmacist

## 2022-10-07 MED ORDER — DAPAGLIFLOZIN PROPANEDIOL 10 MG PO TABS: 10.0000 mg | ORAL_TABLET | Freq: Every day | ORAL | 2 refills | Status: DC

## 2022-10-07 NOTE — Progress Notes (Signed)
10/07/2022 Name: Joseph Grimes MRN: [id number] DOB: November 13, 1960  Chief Complaint  Patient presents with   Diabetes   Medication Assistance    Joseph Grimes is a 62 y.o. year old male who presented for a telephone visit.   They were referred to the pharmacist by their PCP for assistance in managing diabetes and medication access.   Subjective:  Care Team: Primary Care Provider: Samuel Bouche, NP ;  Medication Access/Adherence  Current Pharmacy:  [place], [place] - [address] [place] [address] Phone: [phone] Fax: [phone]  [place], [place] [address] Phone: [phone] Fax: [place], [place] [place] [address] Phone: [phone] Fax: [phone]    Diabetes:  Current medications: farxiga '10mg'$  (taking two tablets), toujeo 22 units   Current glucose readings: lowest 80, highest 221, mostly runs around 140-150s Using traditional meter; testing 2 times daily  Patient denies hypoglycemic s/sx including dizziness, shakiness, sweating. Patient denies hyperglycemic symptoms including polyuria, polydipsia, polyphagia, nocturia, neuropathy, blurred vision.  Current medication access support: farxiga through [employer],    Objective:  Lab Results  Component Value Date   HGBA1C 9.2 (A) 09/15/2022    Lab Results  Component Value Date   CREATININE 3.77 (H) 09/15/2022   BUN 29 (H) 09/15/2022   NA 138 09/15/2022   K 5.3 09/15/2022   CL 109 09/15/2022   CO2 22 09/15/2022    Lab Results  Component Value Date   CHOL 101 12/02/2021   HDL 48 12/02/2021   LDLCALC 37 12/02/2021   TRIG 75 12/02/2021   CHOLHDL 2.1 12/02/2021    Medications Reviewed Today     Reviewed by Rande Lawman, CMA (Certified Medical Assistant) on  09/15/22 at 1001  Med List Status: <None>   Medication Order Taking? Sig Documenting Provider Last Dose Status Informant  Accu-Chek Softclix Lancets lancets [id number] Yes  [provider] Taking Active   aspirin 81 MG tablet [id number] Yes Take 1 tablet (81 mg total) by mouth daily. Emeterio Reeve, DO Taking Active   blood glucose meter kit and supplies [id number] Yes Dispense based on patient and insurance preference. Use up to four times daily as directed. (FOR ICD-10 E10.9, E11.9). Emeterio Reeve, DO Taking Active   cilostazol (PLETAL) 100 MG tablet [id number] Yes TAKE 1 TABLET TWICE DAILY Samuel Bouche, NP Taking Active   dapagliflozin propanediol (FARXIGA) 5 MG TABS tablet [id number] Yes Take 5 mg by mouth daily. Obtains via patient assistance as of 07/2022 [provider] Taking Active   FEROSUL 325 (65 Fe) MG tablet [id number] Yes TAKE 1 TABLET TWICE DAILY WITH MEALS Emeterio Reeve, DO Taking Active   furosemide (LASIX) 20 MG tablet [id number] Yes TAKE 1 TABLET EVERY DAY Jessup, Joy, NP Taking Active   insulin glargine, 2 Unit Dial, (TOUJEO MAX SOLOSTAR) 300 UNIT/ML Solostar Pen [id number] Yes Inject 16 Units into the skin at bedtime. Increase by 4 units every 4 days until fasting (morning) blood sugar is <150, then stay at that dose.  Patient taking differently: Inject 35 Units into the skin at bedtime. Increase by 4 units every 4 days until fasting (morning) blood sugar is <150, then stay at that dose. (Taking 35 units daily as of 06/23/21)  Emeterio Reeve, DO Taking Active            Med Note Dorene Ar Mar 24, 2021 10:11 AM) Taking 42 units as of 03/24/21   Insulin Pen Needle 29G X 12.7MM MISC [id number] Yes 100 each by Does not apply route daily. Please dispense qty, brand, appropriate pen sizing for toujeo Magdalene River, NP Taking Active   MODERNA COVID-19 BIVAL BOOSTER 50 MCG/0.5ML injection [id number] Yes  [provider] Taking Active    Oxycodone HCl 10 MG TABS [id number] Yes Take 0.5-1 tablets (5-10 mg total) by mouth every 6 (six) hours as needed. Next refill due 12/06/22 Samuel Bouche, NP Taking Active   pantoprazole (PROTONIX) 40 MG tablet [id number] Yes TAKE 1 TABLET EVERY DAY Samuel Bouche, NP Taking Active   potassium chloride (KLOR-CON M) 10 MEQ tablet [id number] Yes TAKE 1 TABLET EVERY DAY Jessup, Joy, NP Taking Active   rosuvastatin (CRESTOR) 40 MG tablet [id number] Yes TAKE 1 TABLET EVERY DAY (OFFICE VISIT REQUIRED PRIOR TO ANY FURTHER REFILLS) Samuel Bouche, NP Taking Active   TRUE METRIX BLOOD GLUCOSE TEST test strip [id number] Yes TEST BLOOD SUGAR UP TO FOUR TIMES DAILY AS DIRECTED Samuel Bouche, NP Taking Active   valsartan (DIOVAN) 40 MG tablet [id number] Yes Take 1 tablet (40 mg total) by mouth daily. Samuel Bouche, NP Taking Active               Assessment/Plan:   Diabetes: - Currently uncontrolled, but plans in place for medication adjustments - Recommend to continue current medications  - Recommend to check glucose 2x day as currently doing - Will facilitate farxiga '10mg'$  RX to [facility] pharmacy for dose change to patient assistance program. Will also investigate status update of [employer] shipping as patient states running low on his toujeo supply (via patient assistance)  -[employer] update: re-enrollment received but was not processed, despite patient stating he received a letter of approval in the mail. The representative sent it to be processed and expedited, should be sooner than 5-7 business days and then shipment will go out as early as Monday 10/10/22, if re-enrollment is processed today.    Follow Up Plan: 1-2 months  Larinda Buttery, PharmD Clinical Pharmacist [facility] At [facility] [phone]

## 2022-10-11 ENCOUNTER — Ambulatory Visit (INDEPENDENT_AMBULATORY_CARE_PROVIDER_SITE_OTHER): Payer: Medicare HMO | Admitting: Medical-Surgical

## 2022-10-11 DIAGNOSIS — Z0000 Encounter for general adult medical examination without abnormal findings: Secondary | ICD-10-CM

## 2022-10-11 NOTE — Progress Notes (Signed)
MEDICARE ANNUAL WELLNESS VISIT  10/11/2022  Telephone Visit Disclaimer This Medicare AWV was conducted by telephone due to national recommendations for restrictions regarding the COVID-19 Pandemic (e.g. social distancing).  I verified, using two identifiers, that I am speaking with Joseph Grimes or their authorized healthcare agent. I discussed the limitations, risks, security, and privacy concerns of performing an evaluation and management service by telephone and the potential availability of an in-person appointment in the future. The patient expressed understanding and agreed to proceed.  Location of Patient: Home Location of Provider (nurse):  in the office  Subjective:    Joseph Grimes is a 62 y.o. male patient of Joseph Bouche, NP who had a Medicare Annual Wellness Visit today via telephone. Mosa is Disabled and lives with their daughter. he has 7 children. he reports that he is socially active and does interact with friends/family regularly. he is minimally physically active and enjoys spending time with grandchildren and watching television.  Patient Care Team: Joseph Bouche, NP as PCP - General (Nurse Practitioner) Darius Bump, [facility] as Pharmacist (Pharmacist)     10/11/2022   11:35 AM 03/10/2022   10:55 AM 10/06/2021   10:39 AM 07/30/2020   10:12 AM 07/23/2020   10:20 AM 07/19/2019   12:14 PM 07/12/2019    4:41 PM  Advanced Directives  Does Patient Have a Medical Advance Directive? No No No No No No Yes  Type of Advance Directive       Living will;Healthcare Power of Attorney  Does patient want to make changes to medical advance directive?      No - Patient declined No - Patient declined  Would patient like information on creating a medical advance directive? No - Patient declined No - Patient declined No - Patient declined No - Patient declined No - Patient declined No - Patient declined     Hospital Utilization Over the Past 12 Months: # of hospitalizations or ER  visits: 0 # of surgeries: 0  Review of Systems    Patient reports that his overall health is unchanged compared to last year.  History obtained from chart review and the patient  Patient Reported Readings (BP, Pulse, CBG, Weight, etc) none  Pain Assessment Pain : No/denies pain     Current Medications & Allergies (verified) Allergies as of 10/11/2022       Reactions   Ozempic (0.25 Or 0.5 Mg-dose) [semaglutide(0.25 Or 0.'5mg'$ -dos)] Diarrhea        Medication List        Accurate as of October 11, 2022 11:44 AM. If you have any questions, ask your nurse or doctor.          Accu-Chek Softclix Lancets lancets   aspirin 81 MG tablet Take 1 tablet (81 mg total) by mouth daily.   blood glucose meter kit and supplies Dispense based on patient and insurance preference. Use up to four times daily as directed. (FOR ICD-10 E10.9, E11.9).   cilostazol 100 MG tablet Commonly known as: PLETAL TAKE 1 TABLET TWICE DAILY   dapagliflozin propanediol 10 MG Tabs tablet Commonly known as: Farxiga Take 1 tablet (10 mg total) by mouth daily before breakfast.   FeroSul 325 (65 FE) MG tablet Generic drug: ferrous sulfate TAKE 1 TABLET TWICE DAILY WITH MEALS   furosemide 20 MG tablet Commonly known as: LASIX TAKE 1 TABLET EVERY DAY   Insulin Pen Needle 29G X 12.7MM Misc 100 each by Does not apply route daily. Please dispense  qty, brand, appropriate pen sizing for toujeo solostar   Moderna COVID-19 Bival Booster 50 MCG/0.5ML injection Generic drug: COVID-19 mRNA bivalent vaccine (Moderna)   Oxycodone HCl 10 MG Tabs Take 0.5-1 tablets (5-10 mg total) by mouth every 6 (six) hours as needed. Next refill due 12/06/22   pantoprazole 40 MG tablet Commonly known as: PROTONIX TAKE 1 TABLET EVERY DAY   potassium chloride 10 MEQ tablet Commonly known as: KLOR-CON M TAKE 1 TABLET EVERY DAY   rosuvastatin 40 MG tablet Commonly known as: CRESTOR TAKE 1 TABLET EVERY DAY (OFFICE VISIT  REQUIRED PRIOR TO ANY FURTHER REFILLS)   Toujeo Max SoloStar 300 UNIT/ML Solostar Pen Generic drug: insulin glargine (2 Unit Dial) Inject 16 Units into the skin at bedtime. Increase by 4 units every 4 days until fasting (morning) blood sugar is <150, then stay at that dose. What changed:  how much to take additional instructions   True Metrix Blood Glucose Test test strip Generic drug: glucose blood TEST BLOOD SUGAR UP TO FOUR TIMES DAILY AS DIRECTED   valsartan 40 MG tablet Commonly known as: DIOVAN Take 1 tablet (40 mg total) by mouth daily.        History (reviewed): Past Medical History:  Diagnosis Date   CKD (chronic kidney disease) stage 3, GFR 30-59 ml/min (HCC) 10/11/2018   Coronary artery disease 10/11/2018   Diabetes ([place])    Diabetes mellitus type 2, controlled, with complications ([place]) [id number]   Essential hypertension 10/11/2018   GERD (gastroesophageal reflux disease)    Heart attack ([place]) 2006   stent placed in right coronary artery   High cholesterol    History of anemia 10/11/2018   History of low potassium    Low hemoglobin    Mixed hyperlipidemia 10/11/2018   Peripheral vascular disease ([place]) 10/11/2018   Stroke ([place])    Tobacco dependence 10/11/2018   Past Surgical History:  Procedure Laterality Date   AMPUTATION Left 07/19/2019   Procedure: LEFT PARTIAL THUMB AMPUTATION;  Surgeon: Leandrew Koyanagi, MD;  Location: [place];  Service: Orthopedics;  Laterality: Left;  with digital block   CARDIAC CATHETERIZATION  2006   04/18/18: no stents, stent in '06   FEMORAL-POPLITEAL BYPASS GRAFT Right 05/12/2017   HAND SURGERY     KNEE ARTHROSCOPY     bilat   TOE AMPUTATION     x2   TONSILLECTOMY     Family History  Problem Relation Age of Onset   Diabetes Sister    Diabetes Brother    Heart disease Mother    Heart attack Father    Diabetes Father    Diabetes Sister    Social History   Socioeconomic History   Marital status: Widowed    Spouse  name: Not on file   Number of children: 7   Years of education: 14   Highest education level: Associate degree: academic program  Occupational History   Occupation: Retired; Disabled.  Tobacco Use   Smoking status: Every Day    Packs/day: 0.50    Types: Cigarettes   Smokeless tobacco: Never   Tobacco comments:    refused  Vaping Use   Vaping Use: Never used  Substance and Sexual Activity   Alcohol use: Not Currently   Drug use: Never   Sexual activity: Not Currently    Partners: Female  Other Topics Concern   Not on file  Social History Narrative   Lives with daughter and 2 grand children. Enjoys spending time with  the grand children and watching TV.   Social Determinants of Health   Financial Resource Strain: Low Risk  (10/11/2022)   Overall Financial Resource Strain (CARDIA)    Difficulty of Paying Living Expenses: Not hard at all  Food Insecurity: No Food Insecurity (10/11/2022)   Hunger Vital Sign    Worried About Running Out of Food in the Last Year: Never true    Ran Out of Food in the Last Year: Never true  Transportation Needs: No Transportation Needs (10/11/2022)   PRAPARE - Hydrologist (Medical): No    Lack of Transportation (Non-Medical): No  Physical Activity: Inactive (10/11/2022)   Exercise Vital Sign    Days of Exercise per Week: 0 days    Minutes of Exercise per Session: 0 min  Stress: No Stress Concern Present (10/11/2022)   [place]    Feeling of Stress : Not at all  Social Connections: Socially Isolated (10/11/2022)   Social Connection and Isolation Panel [NHANES]    Frequency of Communication with Friends and Family: More than three times a week    Frequency of Social Gatherings with Friends and Family: More than three times a week    Attends Religious Services: Never    Marine scientist or Organizations: No    Attends Archivist Meetings: Never     Marital Status: Widowed    Activities of Daily Living    10/11/2022   11:40 AM  In your present state of health, do you have any difficulty performing the following activities:  Hearing? 0  Vision? 0  Difficulty concentrating or making decisions? 0  Walking or climbing stairs? 1  Comment stairs  Dressing or bathing? 0  Doing errands, shopping? 0  Preparing Food and eating ? N  Using the Toilet? N  In the past six months, have you accidently leaked urine? N  Do you have problems with loss of bowel control? N  Managing your Medications? N  Managing your Finances? N  Housekeeping or managing your Housekeeping? N    Patient Education/ Literacy How often do you need to have someone help you when you read instructions, pamphlets, or other written materials from your doctor or pharmacy?: 1 - Never What is the last grade level you completed in school?: associates degree  Exercise Current Exercise Habits: The patient does not participate in regular exercise at present, Exercise limited by: None identified  Diet Patient reports consuming 2 meals a day and 0-1 snack(s) a day Patient reports that his primary diet is: Regular Patient reports that she does have regular access to food.   Depression Screen    10/11/2022   11:36 AM 09/15/2022    9:59 AM 06/03/2022   10:41 AM 03/03/2022   10:54 AM 12/02/2021    1:35 PM 10/06/2021   10:39 AM 09/03/2021    1:54 PM  PHQ 2/9 Scores  PHQ - 2 Score 0 0 0 0 0 0 0     Fall Risk    10/11/2022   11:36 AM 09/15/2022    9:59 AM 06/03/2022   10:41 AM 03/03/2022   10:55 AM 12/02/2021    1:35 PM  Fall Risk   Falls in the past year? 0 0 0 0 0  Number falls in past yr: 0 0 0 0 0  Injury with Fall? 0 0 0 0 0  Risk for fall due to : No Fall Risks  No Fall Risks No Fall Risks No Fall Risks   Follow up Falls evaluation completed Falls evaluation completed Falls evaluation completed Falls evaluation completed      Objective:  MILES NEUPERT seemed  alert and oriented and he participated appropriately during our telephone visit.  Blood Pressure Weight BMI  BP Readings from Last 3 Encounters:  09/15/22 110/65  06/03/22 131/71  03/03/22 121/73   Wt Readings from Last 3 Encounters:  09/15/22 154 lb 6.4 oz (70 kg)  06/03/22 156 lb (70.8 kg)  03/03/22 155 lb 0.6 oz (70.3 kg)   BMI Readings from Last 1 Encounters:  09/15/22 22.80 kg/m    *Unable to obtain current vital signs, weight, and BMI due to telephone visit type  Hearing/Vision  Hollice Espy did not seem to have difficulty with hearing/understanding during the telephone conversation Reports that he has not had a formal eye exam by an eye care professional within the past year Reports that he has not had a formal hearing evaluation within the past year *Unable to fully assess hearing and vision during telephone visit type  Cognitive Function:    10/11/2022   11:42 AM 10/06/2021   10:42 AM 07/23/2020   10:22 AM  6CIT Screen  What Year? 0 points 0 points 0 points  What month? 0 points 0 points 0 points  What time? 0 points 0 points 0 points  Count back from 20 0 points 0 points 0 points  Months in reverse 0 points 0 points 0 points  Repeat phrase 0 points 0 points 0 points  Total Score 0 points 0 points 0 points   (Normal:0-7, Significant for Dysfunction: >8)  Normal Cognitive Function Screening: Yes   Immunization & Health Maintenance Record Immunization History  Administered Date(s) Administered   Janssen (J&J) SARS-COV-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 12/04/2019   PFIZER(Purple Top)SARS-COV-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 06/10/2020    Health Maintenance  Topic Date Due   HIV Screening  Never done   Hepatitis C Screening  Never done   DTaP/Tdap/Td (1 - Tdap) Never done   Zoster Vaccines- Shingrix (1 of 2) Never done   COVID-19 Vaccine (3 - 2023-24 season) 04/08/2022   FOOT EXAM  06/02/2022   OPHTHALMOLOGY EXAM  06/16/2022   INFLUENZA VACCINE  11/06/2022 (Originally 03/08/2022)   Diabetic kidney  evaluation - Urine ACR  03/04/2023   HEMOGLOBIN A1C  03/16/2023   Diabetic kidney evaluation - eGFR measurement  09/16/2023   Medicare Annual Wellness (AWV)  10/11/2023   COLONOSCOPY (Pts 45-87yr Insurance coverage will need to be confirmed)  07/19/2027   HPV VACCINES  Aged Out       Assessment  This is a routine wellness examination for [employer]  Health Maintenance: Due or Overdue Health Maintenance Due  Topic Date Due   HIV Screening  Never done   Hepatitis C Screening  Never done   DTaP/Tdap/Td (1 - Tdap) Never done   Zoster Vaccines- Shingrix (1 of 2) Never done   COVID-19 Vaccine (3 - 2023-24 season) 04/08/2022   FOOT EXAM  06/02/2022   OPHTHALMOLOGY EXAM  06/16/2022    LJanice Coffindoes not need a referral for Community Assistance: Care Management:   no Social Work:    no Prescription Assistance:  no Nutrition/Diabetes Education:  no   Plan:  Personalized Goals  Goals Addressed               This Visit's Progress     Patient Stated (pt-stated)  10/11/2022 AWV Goal: Diabetes Management  Patient will maintain an A1C level below 8.0 Patient will not develop any diabetic foot complications Patient will not experience any hypoglycemic episodes over the next 3 months Patient will notify our office of any CBG readings outside of the provider recommended range by calling [phone] Patient will adhere to provider recommendations for diabetes management  Patient Self Management Activities take all medications as prescribed and report any negative side effects monitor and record blood sugar readings as directed adhere to a low carbohydrate diet that incorporates lean proteins, vegetables, whole grains, low glycemic fruits check feet daily noting any sores, cracks, injuries, or callous formations see PCP or podiatrist if he notices any changes in his legs, feet, or toenails Patient will visit PCP and have an A1C level checked every 3 to 6 months  as directed  have a yearly eye exam to monitor for vascular changes associated with diabetes and will request that the report be sent to his pcp.  consult with his PCP regarding any changes in his health or new or worsening symptoms        Personalized Health Maintenance & Screening Recommendations  Influenza vaccine Td vaccine Shingrix vaccine Patient declined the vaccines at this time. Foot exam  Lung Cancer Screening Recommended: yes; declined (Low Dose CT Chest recommended if Age 102-80 years, 20 pack-year currently smoking OR have quit w/in past 15 years) Hepatitis C Screening recommended: yes HIV Screening recommended: yes  Advanced Directives: Written information was not prepared per patient's request.  Referrals & Orders No orders of the defined types were placed in this encounter.   Follow-up Plan Follow-up with Joseph Bouche, NP as planned Medicare wellness visit in one year. AVS printed and mailed to the patient.   I have personally reviewed and noted the following in the patient's chart:   Medical and social history Use of alcohol, tobacco or illicit drugs  Current medications and supplements Functional ability and status Nutritional status Physical activity Advanced directives List of other physicians Hospitalizations, surgeries, and ER visits in previous 12 months Vitals Screenings to include cognitive, depression, and falls Referrals and appointments  In addition, I have reviewed and discussed with Joseph Grimes certain preventive protocols, quality metrics, and best practice recommendations. A written personalized care plan for preventive services as well as general preventive health recommendations is available and can be mailed to the patient at his request.      Tinnie Gens, RN BSN  10/11/2022

## 2022-10-11 NOTE — Patient Instructions (Addendum)
Joseph Grimes and Written Plan of Care  Joseph Grimes ,  Thank you for allowing me to perform your Medicare Annual Wellness Visit and for your ongoing commitment to your health.   Health Maintenance & Immunization History Health Maintenance  Topic Date Due   DTaP/Tdap/Td (1 - Tdap) Never done   OPHTHALMOLOGY EXAM  10/11/2022 (Originally 06/16/2022)   FOOT EXAM  10/12/2022 (Originally 06/02/2022)   COVID-19 Vaccine (3 - 2023-24 season) 10/27/2022 (Originally 04/08/2022)   INFLUENZA VACCINE  11/06/2022 (Originally 03/08/2022)   Zoster Vaccines- Shingrix (1 of 2) 01/11/2023 (Originally 07/28/1978)   Hepatitis C Screening  10/11/2023 (Originally 07/28/1977)   HIV Screening  10/11/2023 (Originally 07/28/1974)   Diabetic kidney evaluation - Urine ACR  03/04/2023   HEMOGLOBIN A1C  03/16/2023   Diabetic kidney evaluation - eGFR measurement  09/16/2023   Medicare Annual Wellness (AWV)  10/11/2023   COLONOSCOPY (Pts 45-75yr Insurance coverage will need to be confirmed)  07/19/2027   HPV VACCINES  Aged Out   Immunization History  Administered Date(s) Administered   JEngineer, maintenance(J&J) SARS-COV-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 12/04/2019   PFIZER(Purple Top)SARS-COV-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 06/10/2020    These are the patient goals that we discussed:  Goals Addressed               This Visit's Progress     Patient Stated (pt-stated)        10/11/2022 AWV Goal: Diabetes Management  Patient will maintain an A1C level below 8.0 Patient will not develop any diabetic foot complications Patient will not experience any hypoglycemic episodes over the next 3 months Patient will notify our office of any CBG readings outside of the provider recommended range by calling [phone] will adhere to provider recommendations for diabetes management  Patient Self Management Activities take all medications as prescribed and report any negative side effects monitor and record blood  sugar readings as directed adhere to a low carbohydrate diet that incorporates lean proteins, vegetables, whole grains, low glycemic fruits check feet daily noting any sores, cracks, injuries, or callous formations see PCP or podiatrist if he notices any changes in his legs, feet, or toenails Patient will visit PCP and have an A1C level checked every 3 to 6 months as directed  have a yearly eye exam to monitor for vascular changes associated with diabetes and will request that the report be sent to his pcp.  consult with his PCP regarding any changes in his health or new or worsening symptoms          This is a list of Health Maintenance Items that are overdue or due now: Health Maintenance Due  Topic Date Due   DTaP/Tdap/Td (1 - Tdap) Never done   Influenza vaccine Td vaccine Shingrix vaccine Patient declined the vaccines at this time. HIV and hepatitis screening Foot exam  Lung Cancer Screening Recommended: declined  Orders/Referrals Placed Today: No orders of the defined types were placed in this encounter.  (Contact our referral department at [phone] you have not spoken with someone about your referral appointment within the next 5 days)    Follow-up Plan Follow-up with JSamuel Bouche NP as planned Medicare wellness visit in one year. AVS printed and mailed to the patient.      Health Maintenance, Male Adopting a healthy lifestyle and getting preventive care are important in promoting health and wellness. Ask your health care provider about: The right schedule for you to have regular tests and exams. Things you can do  on your own to prevent diseases and keep yourself healthy. What should I know about diet, weight, and exercise? Eat a healthy diet  Eat a diet that includes plenty of vegetables, fruits, low-fat dairy products, and lean protein. Do not eat a lot of foods that are high in solid fats, added sugars, or sodium. Maintain a healthy weight Body mass  index (BMI) is a measurement that can be used to identify possible weight problems. It estimates body fat based on height and weight. Your health care provider can help determine your BMI and help you achieve or maintain a healthy weight. Get regular exercise Get regular exercise. This is one of the most important things you can do for your health. Most adults should: Exercise for at least 150 minutes each week. The exercise should increase your heart rate and make you sweat (moderate-intensity exercise). Do strengthening exercises at least twice a week. This is in addition to the moderate-intensity exercise. Spend less time sitting. Even light physical activity can be beneficial. Watch cholesterol and blood lipids Have your blood tested for lipids and cholesterol at 64 years of age, then have this test every 5 years. You may need to have your cholesterol levels checked more often if: Your lipid or cholesterol levels are high. You are older than 64 years of age. You are at high risk for heart disease. What should I know about cancer screening? Many types of cancers can be detected early and may often be prevented. Depending on your health history and family history, you may need to have cancer screening at various ages. This may include screening for: Colorectal cancer. Prostate cancer. Skin cancer. Lung cancer. What should I know about heart disease, diabetes, and high blood pressure? Blood pressure and heart disease High blood pressure causes heart disease and increases the risk of stroke. This is more likely to develop in people who have high blood pressure readings or are overweight. Talk with your health care provider about your target blood pressure readings. Have your blood pressure checked: Every 3-5 years if you are 75-51 years of age. Every year if you are 15 years old or older. If you are between the ages of 35 and 68 and are a current or former smoker, ask your health care  provider if you should have a one-time screening for abdominal aortic aneurysm (AAA). Diabetes Have regular diabetes screenings. This checks your fasting blood sugar level. Have the screening done: Once every three years after age 16 if you are at a normal weight and have a low risk for diabetes. More often and at a younger age if you are overweight or have a high risk for diabetes. What should I know about preventing infection? Hepatitis B If you have a higher risk for hepatitis B, you should be screened for this virus. Talk with your health care provider to find out if you are at risk for hepatitis B infection. Hepatitis C Blood testing is recommended for: Everyone born from [birth year] through 1967. Anyone with known risk factors for hepatitis C. Sexually transmitted infections (STIs) You should be screened each year for STIs, including gonorrhea and chlamydia, if: You are sexually active and are younger than 64 years of age. You are older than 64 years of age and your health care provider tells you that you are at risk for this type of infection. Your sexual activity has changed since you were last screened, and you are at increased risk for chlamydia or gonorrhea. Ask your health  care provider if you are at risk. Ask your health care provider about whether you are at high risk for HIV. Your health care provider may recommend a prescription medicine to help prevent HIV infection. If you choose to take medicine to prevent HIV, you should first get tested for HIV. You should then be tested every 3 months for as long as you are taking the medicine. Follow these instructions at home: Alcohol use Do not drink alcohol if your health care provider tells you not to drink. If you drink alcohol: Limit how much you have to 0-2 drinks a day. Know how much alcohol is in your drink. In the [place] one drink equals one 12 oz bottle of beer (355 mL), one 5 oz glass of wine (148 mL), or one 1 oz glass of hard  liquor (44 mL). Lifestyle Do not use any products that contain nicotine or tobacco. These products include cigarettes, chewing tobacco, and vaping devices, such as e-cigarettes. If you need help quitting, ask your health care provider. Do not use street drugs. Do not share needles. Ask your health care provider for help if you need support or information about quitting drugs. General instructions Schedule regular health, dental, and eye exams. Stay current with your vaccines. Tell your health care provider if: You often feel depressed. You have ever been abused or do not feel safe at home. Grimes Adopting a healthy lifestyle and getting preventive care are important in promoting health and wellness. Follow your health care provider's instructions about healthy diet, exercising, and getting tested or screened for diseases. Follow your health care provider's instructions on monitoring your cholesterol and blood pressure. This information is not intended to replace advice given to you by your health care provider. Make sure you discuss any questions you have with your health care provider. Document Revised: 12/14/2020 Document Reviewed: 12/14/2020 Elsevier Patient Education  [place].

## 2022-10-12 ENCOUNTER — Other Ambulatory Visit: Payer: Self-pay | Admitting: Pharmacist

## 2022-10-12 NOTE — Progress Notes (Signed)
Care Coordination Call  Contacted patient in response to [employer] paperwork dropped in physical mailbox.   Toujeo is approved via [employer] patient assistance through 08/08/23. Should see a medication shipment to our office soon.  No answer, left patient a voicemail.  Larinda Buttery, PharmD Clinical Pharmacist [facility] At [facility] [phone]

## 2022-10-14 ENCOUNTER — Ambulatory Visit (INDEPENDENT_AMBULATORY_CARE_PROVIDER_SITE_OTHER): Payer: Medicare HMO | Admitting: Podiatry

## 2022-10-14 ENCOUNTER — Encounter: Payer: Self-pay | Admitting: Podiatry

## 2022-10-14 DIAGNOSIS — M79674 Pain in right toe(s): Secondary | ICD-10-CM

## 2022-10-14 DIAGNOSIS — I739 Peripheral vascular disease, unspecified: Secondary | ICD-10-CM | POA: Diagnosis not present

## 2022-10-14 DIAGNOSIS — E119 Type 2 diabetes mellitus without complications: Secondary | ICD-10-CM

## 2022-10-14 DIAGNOSIS — E1165 Type 2 diabetes mellitus with hyperglycemia: Secondary | ICD-10-CM | POA: Diagnosis not present

## 2022-10-14 DIAGNOSIS — Z89421 Acquired absence of other right toe(s): Secondary | ICD-10-CM | POA: Diagnosis not present

## 2022-10-14 DIAGNOSIS — E1140 Type 2 diabetes mellitus with diabetic neuropathy, unspecified: Secondary | ICD-10-CM | POA: Diagnosis not present

## 2022-10-14 DIAGNOSIS — E1151 Type 2 diabetes mellitus with diabetic peripheral angiopathy without gangrene: Secondary | ICD-10-CM | POA: Diagnosis not present

## 2022-10-14 DIAGNOSIS — M79675 Pain in left toe(s): Secondary | ICD-10-CM | POA: Diagnosis not present

## 2022-10-14 DIAGNOSIS — B351 Tinea unguium: Secondary | ICD-10-CM

## 2022-10-14 NOTE — Progress Notes (Signed)
  Subjective:  Patient ID: Joseph Grimes, male    DOB: 1961-01-17,   MRN: [id number]  Chief Complaint  Patient presents with   Nail Problem    Diabetic foot care      62 y.o. male presents for concern of thickened elongated and painful nails that are difficult to trim. Requesting to have them trimmed today. Relates burning and tingling in their feet. Patient is diabetic and last A1c was  Lab Results  Component Value Date   HGBA1C 9.2 (A) 09/15/2022   .   PCP:  Samuel Bouche, NP      PCP: Samuel Bouche NP   Past Medical History:  Diagnosis Date   CKD (chronic kidney disease) stage 3, GFR 30-59 ml/min ([place]) 10/11/2018   Coronary artery disease 10/11/2018   Diabetes ([place])    Diabetes mellitus type 2, controlled, with complications ([place]) 09/13/6387   Essential hypertension 10/11/2018   GERD (gastroesophageal reflux disease)    Heart attack ([place]) 2006   stent placed in right coronary artery   High cholesterol    History of anemia 10/11/2018   History of low potassium    Low hemoglobin    Mixed hyperlipidemia 10/11/2018   Peripheral vascular disease ([place]) 10/11/2018   Stroke ([place])    Tobacco dependence 10/11/2018    Objective:  Physical Exam: Vascular: DP/PT pulses 2/4 bilateral. CFT <3 seconds. Absent hair growth and xerosis noted to bilateral lower extremity., . No edema.  Skin. No lacerations or abrasions bilateral feet. Plantar third digit wound healed mild callus overlying. Hyperkeratotic tissue noted to posterior left heel. Nails 1-5 on left and 145 on right are thickened elongated and discolored with subungual debris.   Musculoskeletal: MMT 5/5 bilateral lower extremities in DF, PF, Inversion and Eversion. Deceased ROM in DF of ankle joint. 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  Neurological: Sensation intact to light touch.   Assessment:   1. Encounter for diabetic foot exam ([place])   2. Pain due to onychomycosis of toenails of both feet   3. Poorly controlled type 2  diabetes mellitus with neuropathy ([place])   4. Peripheral vascular disease ([place])            Plan:  Patient was evaluated and treated and all questions answered.  -Discussed and educated patient on diabetic foot care, especially with  regards to the vascular, neurological and musculoskeletal systems.  -Stressed the importance of good glycemic control and the detriment of not  controlling glucose levels in relation to the foot. -Discussed supportive shoes at all times and checking feet regularly.  -Mechanically debrided all nails 1-5 bilateral using sterile nail nipper and filed with dremel without incident  -Answered all patient questions -Patient to return  in 3 months for at risk foot care -Patient advised to call the office if any problems or questions arise in the meantime.    Return in about 3 months (around 01/14/2023) for rfc.   Lorenda Peck, DPM

## 2022-10-17 ENCOUNTER — Telehealth: Payer: Self-pay

## 2022-10-17 ENCOUNTER — Other Ambulatory Visit: Payer: Self-pay | Admitting: Pharmacist

## 2022-10-17 NOTE — Progress Notes (Signed)
Care Coordination Call   Attempted to return call x2 to patient, no answer. Left VM with instructions to call my direct line at his earliest convenience.  Larinda Buttery, PharmD Clinical Pharmacist [facility] Primary [facility] At [facility] [phone]

## 2022-10-17 NOTE — Progress Notes (Signed)
10/17/2022 Name: Joseph Grimes MRN: [id number] DOB: 1960-12-20  No chief complaint on file.   Joseph Grimes is a 62 y.o. year old male who presented for a telephone visit.   They were referred to the pharmacist by their PCP for assistance in managing diabetes and medication access.   Subjective:  Care Team: Primary Care Provider: Samuel Bouche, NP ;  Medication Access/Adherence  Current Pharmacy:  [place], [place] [address] [place] [address] Phone: [phone] Fax: [phone]  [place], [place] [address] Phone: [phone] Fax: [place], [place]. [place] [address] Phone: [phone] Fax: [phone]    Diabetes:  Current medications: farxiga '10mg'$  (taking two tablets), toujeo 24 units   Patient states BG still same, Prior glucose readings: lowest 80, highest 221, mostly runs around 140-150s Using traditional meter; testing 2 times daily. He does report machine seems to be dying and requests new Rx.  Patient denies hypoglycemic s/sx including dizziness, shakiness, sweating. Patient denies hyperglycemic symptoms including polyuria, polydipsia, polyphagia, nocturia, neuropathy, blurred vision.  Current medication access support: farxiga through AZ&Me, toujeo via [employer]    Objective:  Lab Results  Component Value Date   HGBA1C 9.2 (A) 09/15/2022    Lab Results  Component Value Date   CREATININE 3.77 (H) 09/15/2022   BUN 29 (H) 09/15/2022   NA 138 09/15/2022   K 5.3 09/15/2022   CL 109 09/15/2022   CO2 22 09/15/2022    Lab Results  Component Value Date   CHOL 101 12/02/2021   HDL 48 12/02/2021   LDLCALC 37 12/02/2021   TRIG 75 12/02/2021   CHOLHDL 2.1 12/02/2021    Medications Reviewed Today     Reviewed  by Lorenda Peck, DPM (Physician) on 10/14/22 at 1116  Med List Status: <None>   Medication Order Taking? Sig Documenting Provider Last Dose Status Informant  Accu-Chek Softclix Lancets lancets [id number] No  [provider] Taking Active   aspirin 81 MG tablet [id number] No Take 1 tablet (81 mg total) by mouth daily. Emeterio Reeve, DO Taking Active   blood glucose meter kit and supplies [id number] No Dispense based on patient and insurance preference. Use up to four times daily as directed. (FOR ICD-10 E10.9, E11.9). Emeterio Reeve, DO Taking Active   cilostazol (PLETAL) 100 MG tablet [id number] No TAKE 1 TABLET TWICE DAILY Samuel Bouche, NP Taking Active   dapagliflozin propanediol (FARXIGA) 10 MG TABS tablet [id number] No Take 1 tablet (10 mg total) by mouth daily before breakfast. Samuel Bouche, NP Taking Active   FEROSUL 325 (65 Fe) MG tablet [id number] No TAKE 1 TABLET TWICE DAILY WITH MEALS Emeterio Reeve, DO Taking Active   furosemide (LASIX) 20 MG tablet [id number] No TAKE 1 TABLET EVERY DAY Jessup, Joy, NP Taking Active   insulin glargine, 2 Unit Dial, (TOUJEO MAX SOLOSTAR) 300 UNIT/ML Solostar Pen [id number] No Inject 16 Units into the skin at bedtime. Increase by 4 units every 4 days until fasting (morning) blood sugar is <150, then stay at that dose.  Patient taking differently: Inject 35 Units into the skin at bedtime. Increase by 4 units every 4 days until fasting (morning) blood sugar is <150, then stay at that dose. (Taking 35 units  daily as of 06/23/21)   Emeterio Reeve, DO Taking Active            Med Note Jens Som, Felecia Shelling   Fri Oct 07, 2022  2:10 PM)    Insulin Pen Needle 29G X 12.7MM MISC [id number] No 100 each by Does not apply route daily. Please dispense qty, brand, appropriate pen sizing for toujeo solostar Samuel Bouche, NP Taking Active   MODERNA COVID-19 BIVAL BOOSTER 50 MCG/0.5ML injection [id number] No  [provider] Taking Active   Oxycodone HCl  10 MG TABS [id number] No Take 0.5-1 tablets (5-10 mg total) by mouth every 6 (six) hours as needed. Next refill due 12/06/22 Samuel Bouche, NP Taking Active   pantoprazole (PROTONIX) 40 MG tablet [id number] No TAKE 1 TABLET EVERY DAY Samuel Bouche, NP Taking Active   potassium chloride (KLOR-CON M) 10 MEQ tablet [id number] No TAKE 1 TABLET EVERY DAY Jessup, Joy, NP Taking Active   rosuvastatin (CRESTOR) 40 MG tablet [id number] No TAKE 1 TABLET EVERY DAY (OFFICE VISIT REQUIRED PRIOR TO ANY FURTHER REFILLS) Samuel Bouche, NP Taking Active   TRUE METRIX BLOOD GLUCOSE TEST test strip [id number] No TEST BLOOD SUGAR UP TO FOUR TIMES DAILY AS DIRECTED Samuel Bouche, NP Taking Active   valsartan (DIOVAN) 40 MG tablet [id number] No Take 1 tablet (40 mg total) by mouth daily. Samuel Bouche, NP Taking Active               Assessment/Plan:   Diabetes: - Currently uncontrolled, but plans in place for medication adjustments - Recommend to continue current medications  - Recommend to check glucose 2x day as currently doing - Patient called that he is completely out of toujeo. Contacted [employer] for update: due for shipment tomorrow, then arrival to our office 3-5 business days. Therefore will provide tresiba sample to avoid gap in insulin usage until shipment arrives. Coordinated with office staff to arrange medication access. - Will facilitate glucometer RX via PCP   Follow Up Plan: 1-2 months  Larinda Buttery, PharmD Clinical Pharmacist [facility] Primary [facility] At [facility] [phone]

## 2022-10-17 NOTE — Telephone Encounter (Signed)
Patient called trying to gt a hold of Keesha. He stated that it is an Emergency. That it's about his Toujeo.   He would like a call back.

## 2022-10-18 DIAGNOSIS — N184 Chronic kidney disease, stage 4 (severe): Secondary | ICD-10-CM | POA: Diagnosis not present

## 2022-10-19 ENCOUNTER — Other Ambulatory Visit: Payer: Self-pay | Admitting: Medical-Surgical

## 2022-10-21 DIAGNOSIS — N184 Chronic kidney disease, stage 4 (severe): Secondary | ICD-10-CM | POA: Diagnosis not present

## 2022-10-31 ENCOUNTER — Telehealth: Payer: Self-pay | Admitting: Pharmacist

## 2022-10-31 NOTE — Telephone Encounter (Signed)
Please call patient he says its urgent [phone]

## 2022-11-02 ENCOUNTER — Other Ambulatory Visit: Payer: Self-pay | Admitting: Pharmacist

## 2022-11-02 NOTE — Progress Notes (Signed)
11/02/2022 Name: Joseph Grimes MRN: [id number] DOB: 18-Aug-1960  No chief complaint on file.   Joseph Grimes is a 62 y.o. year old male who presented for a telephone visit. Patient contacted me today urgently due to being completely out of insulin since Saturday, 10/29/22.   They were referred to the pharmacist by their PCP for assistance in managing diabetes and medication access.    Subjective:  Care Team: Primary Care Provider: Samuel Bouche, NP   Medication Access/Adherence  Current Pharmacy:  [place], [place] - [address] [place] [address] Phone: [phone] Fax: [phone]  [place], [place] [address] Phone: [phone] Fax: [place], [place]. [place] [address] Phone: [phone] Fax: [phone]    Diabetes:  Current medications: farxiga 10mg  daily (taking two tablets of 5mg ), toujeo 24 units daily  Current glucose readings: unable to check, patient states glucometer is not working Using traditional  meter; testing 1-2 times daily when it is working. Pt is not interested in CGM.  Current medication access support: farxiga via AZ&Me, toujeo via [employer] patient assistance programs   Objective:  Lab Results  Component Value Date   HGBA1C 9.2 (A) 09/15/2022    Lab Results  Component Value Date   CREATININE 3.77 (H) 09/15/2022   BUN 29 (H) 09/15/2022   NA 138 09/15/2022   K 5.3 09/15/2022   CL 109 09/15/2022   CO2 22 09/15/2022    Lab Results  Component Value Date   CHOL 101 12/02/2021   HDL 48 12/02/2021   LDLCALC 37 12/02/2021   TRIG 75 12/02/2021   CHOLHDL 2.1 12/02/2021    Medications Reviewed Today     Reviewed by Darius Bump, [place] (Pharmacist) on 10/17/22 at 1540  Med List Status: <None>    Medication Order Taking? Sig Documenting Provider Last Dose Status Informant  Accu-Chek Softclix Lancets lancets [id number] No  [provider] Taking Active   aspirin 81 MG tablet [id number] No Take 1 tablet (81 mg total) by mouth daily. Emeterio Reeve, DO Taking Active   blood glucose meter kit and supplies [id number] No Dispense based on patient and insurance preference. Use up to four times daily as directed. (FOR ICD-10 E10.9, E11.9). Emeterio Reeve, DO Taking Active   cilostazol (PLETAL) 100 MG tablet [id number] No TAKE 1 TABLET TWICE DAILY Samuel Bouche, NP Taking Active   dapagliflozin propanediol (FARXIGA) 10 MG TABS tablet [id number] No Take 1 tablet (10 mg total) by mouth daily before breakfast. Samuel Bouche, NP Taking Active   FEROSUL 325 (65 Fe) MG tablet [id number] No TAKE 1 TABLET TWICE DAILY WITH MEALS Emeterio Reeve, DO Taking Active   furosemide (LASIX) 20 MG tablet [id number] No TAKE 1 TABLET EVERY DAY Jessup, Joy, NP Taking Active   insulin glargine, 2 Unit Dial, (TOUJEO MAX SOLOSTAR) 300 UNIT/ML Solostar Pen [id number] No Inject 16 Units into the skin at bedtime. Increase by 4 units every 4 days until fasting (morning) blood sugar is <150, then stay at that dose.  Patient taking differently: Inject 35 Units into the skin at bedtime. Increase by 4 units every 4 days until fasting (morning) blood sugar is <150, then stay at that dose. (Taking 35 units daily as of 06/23/21)  Emeterio Reeve, DO Taking Active            Med Note Jens Som, Felecia Shelling   Fri Oct 07, 2022  2:10 PM)    Insulin Pen Needle 29G X 12.7MM MISC [id number] No 100 each by Does not apply route daily. Please dispense qty, brand, appropriate pen sizing for toujeo solostar Samuel Bouche, NP Taking Active   MODERNA COVID-19 BIVAL BOOSTER 50 MCG/0.5ML injection [id number] No  [provider] Taking Active   Oxycodone HCl 10 MG TABS [id number] No Take 0.5-1 tablets (5-10 mg total) by mouth every 6 (six)  hours as needed. Next refill due 12/06/22 Samuel Bouche, NP Taking Active   pantoprazole (PROTONIX) 40 MG tablet [id number] No TAKE 1 TABLET EVERY DAY Samuel Bouche, NP Taking Active   potassium chloride (KLOR-CON M) 10 MEQ tablet [id number] No TAKE 1 TABLET EVERY DAY Jessup, Joy, NP Taking Active   rosuvastatin (CRESTOR) 40 MG tablet [id number] No TAKE 1 TABLET EVERY DAY (OFFICE VISIT REQUIRED PRIOR TO ANY FURTHER REFILLS) Samuel Bouche, NP Taking Active   TRUE METRIX BLOOD GLUCOSE TEST test strip [id number] No TEST BLOOD SUGAR UP TO FOUR TIMES DAILY AS DIRECTED Samuel Bouche, NP Taking Active   valsartan (DIOVAN) 40 MG tablet [id number] No Take 1 tablet (40 mg total) by mouth daily. Samuel Bouche, NP Taking Active               Assessment/Plan:   Diabetes: - Currently uncontrolled - Recommend patient pick up tresiba u200 sample at PCP office.  - Contacted [employer] regarding toujeo medication shipment, who states it was delivered to the office 10/23/22. [employer] offered to process a sooner refill due to lost medication, but this will cut into his supply and run patient short by one fill. [employer] faxed paperwork to process the early refill request. - Recommend to check glucose, will facilitate RX for new glucometer and supplies with PCP - Will facilitiate with RX med assistance team to seek shipment update for farxiga 10mg  daily from patient assistance.   Follow Up Plan: 2-3 weeks  Larinda Buttery, PharmD Clinical Pharmacist [facility] Primary [facility] At [facility] [phone]

## 2022-11-03 MED ORDER — BLOOD GLUCOSE MONITORING SUPPL DEVI: 1.0000 | Freq: Two times a day (BID) | 0 refills | Status: DC

## 2022-11-03 MED ORDER — LANCET DEVICE MISC: 1.0000 | Freq: Two times a day (BID) | 0 refills | Status: AC

## 2022-11-03 MED ORDER — BLOOD GLUCOSE TEST VI STRP: 1.0000 | ORAL_STRIP | Freq: Two times a day (BID) | 3 refills | Status: DC

## 2022-11-03 MED ORDER — LANCETS MISC. MISC: 1.0000 | Freq: Two times a day (BID) | 3 refills | Status: AC

## 2022-11-03 NOTE — Addendum Note (Signed)
Addended bySamuel Bouche on: 11/03/2022 07:10 AM   Modules accepted: Orders

## 2022-11-07 ENCOUNTER — Telehealth: Payer: Self-pay

## 2022-11-07 NOTE — Telephone Encounter (Signed)
Per provider-   [employer] shipment was received on 10/19/22. No initial documentation was placed in the patient's chart and patient was never notified.   Forwarding to [place] as an [place].  Carol -   [employer] PAP shipment for [employer] 300 units (7 boxes) available in clinic fridge. Attempted to contact the patient, but no answer. I left a detailed message informing the patient that his med is available for pick up today. Toujeo medication in the fridge with patient identifier.    [place]: [id number] LOT: [id number] EXP: 2024-08-07

## 2022-11-07 NOTE — Telephone Encounter (Signed)
Patient came by office to pick up 7 boxes of Toujeo, thanks.

## 2022-11-09 ENCOUNTER — Other Ambulatory Visit: Payer: Self-pay | Admitting: Pharmacist

## 2022-11-09 NOTE — Progress Notes (Signed)
Care Coordination Call  Outreached patient in response to AZ[employer]Me letter received in physical mailbox regarding patient assistance supply of farxiga 10mg  tablets.  Completed tracking number search, which shows delivered to patient's home address on 11/02/22. Contacted patient to confirm receipt, as it was still unknown during our last encounter.  Left voicemail to return call at his convenience.  Larinda Buttery, PharmD, BCPS Clinical Pharmacist [facility]

## 2022-11-10 DIAGNOSIS — N184 Chronic kidney disease, stage 4 (severe): Secondary | ICD-10-CM | POA: Diagnosis not present

## 2022-11-21 DIAGNOSIS — D500 Iron deficiency anemia secondary to blood loss (chronic): Secondary | ICD-10-CM | POA: Diagnosis not present

## 2022-11-21 DIAGNOSIS — N184 Chronic kidney disease, stage 4 (severe): Secondary | ICD-10-CM | POA: Diagnosis not present

## 2022-11-24 ENCOUNTER — Other Ambulatory Visit: Payer: Medicare HMO | Admitting: Pharmacist

## 2022-11-24 MED ORDER — LANCETS MISC. MISC: 1.0000 | Freq: Two times a day (BID) | 11 refills | Status: DC | PRN

## 2022-11-24 MED ORDER — BLOOD GLUCOSE MONITORING SUPPL DEVI: 1.0000 | Freq: Two times a day (BID) | 0 refills | Status: AC | PRN

## 2022-11-24 MED ORDER — INSULIN PEN NEEDLE 30G X 8 MM MISC: 11 refills | Status: DC

## 2022-11-24 MED ORDER — BLOOD GLUCOSE TEST VI STRP: 1.0000 | ORAL_STRIP | Freq: Two times a day (BID) | 11 refills | Status: DC | PRN

## 2022-11-24 MED ORDER — LANCET DEVICE MISC: 1.0000 | Freq: Two times a day (BID) | 0 refills | Status: DC | PRN

## 2022-11-24 NOTE — Progress Notes (Signed)
11/24/2022 Name: Joseph Grimes MRN: [id number] DOB: 1961-01-23  Chief Complaint  Patient presents with   Diabetes   Medication Assistance    Joseph Grimes is a 62 y.o. year old male who presented for a telephone visit.    They were referred to the pharmacist by their PCP for assistance in managing diabetes and medication access.    Subjective:  Care Team: Primary Care Provider: [name], NP   Medication Access/Adherence  Current Pharmacy:  [facility] [id number] - [place], [place] - [address] [facility] [address] [place] [address] Phone: [phone] Fax: [phone]  [facility] - [place], [place] - [address] [place] [address] Phone: [phone] Fax: [phone]  [facility] - [place], [place] - [address] [place] [address] Phone: [phone] Fax: [phone]    Diabetes:  Current medications: farxiga  daily, toujeo 24 units daily  Current glucose readings: unable to check, patient states glucometer is not working Using traditional  meter; testing 1-2 times daily when it is working. Pt is not interested in CGM.  Current medication access support: farxiga via AZ&Me, toujeo via [employer] patient assistance programs   Objective:  Lab Results  Component Value Date   HGBA1C 9.2 (A) 09/15/2022    Lab Results  Component Value Date   CREATININE 3.77 (H) 09/15/2022   BUN 29 (H) 09/15/2022   NA 138 09/15/2022   K 5.3 09/15/2022   CL 109 09/15/2022   CO2 22 09/15/2022    Lab Results  Component Value Date   CHOL 101 12/02/2021   HDL 48 12/02/2021   LDLCALC 37 12/02/2021   TRIG 75 12/02/2021   CHOLHDL 2.1 12/02/2021    Medications Reviewed Today     Reviewed by Gabriel Carina, RPH (Pharmacist) on 10/17/22 at 1540  Med List Status: <None>   Medication Order Taking? Sig Documenting Provider Last Dose Status Informant   Accu-Chek Softclix Lancets lancets [id number] No  [provider] Taking Active   aspirin 81 MG tablet [id number] No Take 1 tablet (81 mg total) by mouth daily. [name], DO Taking Active   blood glucose meter kit and supplies [id number] No Dispense based on patient and insurance preference. Use up to four times daily as directed. (FOR ICD-10 E10.9, E11.9). [name], DO Taking Active   cilostazol (PLETAL) 100 MG tablet [id number] No TAKE 1 TABLET TWICE DAILY [name], NP Taking Active   dapagliflozin propanediol (FARXIGA) 10 MG TABS tablet [id number] No Take 1 tablet (10 mg total) by mouth daily before breakfast. [name], NP Taking Active   FEROSUL 325 (65 Fe) MG tablet [id number] No TAKE 1 TABLET TWICE DAILY WITH MEALS [name], DO Taking Active   furosemide (LASIX) 20 MG tablet [id number] No TAKE 1 TABLET EVERY DAY Jessup, Joy, NP Taking Active   insulin glargine, 2 Unit Dial, (TOUJEO MAX SOLOSTAR) 300 UNIT/ML Solostar Pen [id number] No Inject 16 Units into the skin at bedtime. Increase by 4 units every 4 days until fasting (morning) blood sugar is <150, then stay at that dose.  Patient taking differently: Inject 35 Units into the skin at bedtime. Increase by 4 units every 4 days until fasting (morning) blood sugar is <150, then stay at that dose. (Taking 35 units daily as of 06/23/21)   [name], DO Taking Active  Med Note Gabriel Carina   Fri Oct 07, 2022  2:10 PM)    Insulin Pen Needle 29G X 12.7MM MISC [id number] No 100 each by Does not apply route daily. Please dispense qty, brand, appropriate pen sizing for toujeo solostar [name], NP Taking Active   MODERNA COVID-19 BIVAL BOOSTER 50 MCG/0.5ML injection [id number] No  [provider] Taking Active   Oxycodone HCl 10 MG TABS [id number] No Take 0.5-1 tablets (5-10 mg total) by mouth every 6 (six) hours as needed. Next refill due 12/06/22 [name], NP Taking Active    pantoprazole (PROTONIX) 40 MG tablet [id number] No TAKE 1 TABLET EVERY DAY [name], NP Taking Active   potassium chloride (KLOR-CON M) 10 MEQ tablet [id number] No TAKE 1 TABLET EVERY DAY Jessup, Joy, NP Taking Active   rosuvastatin (CRESTOR) 40 MG tablet [id number] No TAKE 1 TABLET EVERY DAY (OFFICE VISIT REQUIRED PRIOR TO ANY FURTHER REFILLS) [name], NP Taking Active   TRUE METRIX BLOOD GLUCOSE TEST test strip [id number] No TEST BLOOD SUGAR UP TO FOUR TIMES DAILY AS DIRECTED [name], NP Taking Active   valsartan (DIOVAN) 40 MG tablet [id number] No Take 1 tablet (40 mg total) by mouth daily. [name], NP Taking Active               Assessment/Plan:   Diabetes: - Currently uncontrolled, but all supply of diabetes medications from patient assistance is going well at this time - Patient requests the following RX: ---> [facility] to [facility],  ----> glucometer + supplies to [facility]  - Recommend to check glucose, will facilitate RX for new glucometer and supplies with PCP   Medication Management: - Of note, nephrologist prefers to avoid long term PPI use if possible. Likely appears patient is on pantoprazole as GI prophylaxis for dual antiplatelet therapy. He denies ever having reflux or heartburn issues, also denies any history of GI bleed. - Possibly consider PPI change to H2, renal dosed famotidine would be  every other day for prevention. Will discuss with PCP.   Follow Up Plan: 2-3 weeks  Lynnda Shields, PharmD Clinical Pharmacist [facility] Primary Care At [facility] [phone]

## 2022-11-24 NOTE — Addendum Note (Signed)
Addended byChristen Butter on: 11/24/2022 08:00 PM   Modules accepted: Orders

## 2022-12-14 ENCOUNTER — Telehealth: Payer: Self-pay | Admitting: Medical-Surgical

## 2022-12-14 ENCOUNTER — Ambulatory Visit (INDEPENDENT_AMBULATORY_CARE_PROVIDER_SITE_OTHER): Payer: Medicare HMO | Admitting: Medical-Surgical

## 2022-12-14 ENCOUNTER — Encounter: Payer: Self-pay | Admitting: Medical-Surgical

## 2022-12-14 VITALS — BP 138/70 | HR 79 | Resp 20 | Ht 69.0 in | Wt 152.0 lb

## 2022-12-14 DIAGNOSIS — I5032 Chronic diastolic (congestive) heart failure: Secondary | ICD-10-CM

## 2022-12-14 DIAGNOSIS — Z794 Long term (current) use of insulin: Secondary | ICD-10-CM

## 2022-12-14 DIAGNOSIS — Z720 Tobacco use: Secondary | ICD-10-CM

## 2022-12-14 DIAGNOSIS — E1140 Type 2 diabetes mellitus with diabetic neuropathy, unspecified: Secondary | ICD-10-CM | POA: Diagnosis not present

## 2022-12-14 DIAGNOSIS — F1190 Opioid use, unspecified, uncomplicated: Secondary | ICD-10-CM

## 2022-12-14 LAB — POCT GLYCOSYLATED HEMOGLOBIN (HGB A1C)
HbA1c, POC (controlled diabetic range): 9 % — AB (ref 0.0–7.0)
Hemoglobin A1C: 9 % — AB (ref 4.0–5.6)

## 2022-12-14 MED ORDER — OXYCODONE HCL 10 MG PO TABS: 5.0000 mg | ORAL_TABLET | Freq: Four times a day (QID) | ORAL | 0 refills | Status: DC | PRN

## 2022-12-14 MED ORDER — BLOOD GLUCOSE TEST VI STRP: 1.0000 | ORAL_STRIP | Freq: Two times a day (BID) | 11 refills | Status: AC | PRN

## 2022-12-14 NOTE — Progress Notes (Signed)
        Established patient visit  History, exam, impression, and plan:  1. Type 2 diabetes mellitus with diabetic neuropathy, with long-term current use of insulin [facility]) Pleasant 62 year old male in today to follow up on diabetes. He has been working with Lynnda Shields, [facility] on diabetic and medication management. Currently on 24 units of Toujeo daily and [place] 10mg  daily. Compliant with medications but notes a couple of days where he missed his Toujeo  as he was moving to [place] with his daughter. Finally got a new glucometer but they did not send any test strips so hasn't been checking his sugars. POCT A1c 9.0% today. Sending glucose test strips so he can start checking his sugars. Continue Farxiga 10mg  daily. Titrate Toujeo for fasting sugars of 90-120. Follow up with [facility] as recommended.  - POCT HgB A1C  2. Chronic, continuous use of opioids Long history of MSK pain related to OA that has been chronically treated with Oxycodone 5-10mg  every 6 hours as needed. He has instructions that 30 tablets should last him 90 days. Today is requesting an increase in the quantity of his pain medication since he has been using them more frequently and is running out about 3 weeks ahead of time. Reviewed chronic opioid use and risks associated. At this point, if the current prescription is not effective or not meeting his needs, recommend referral to pain management. Patient declined referral and agrees to continue his current prescription. Refill sent.   3. Chronic diastolic heart failure (HCC) BP controlled. Fluid volume status well managed with furosemide 20mg  daily. No peripheral edema. HRR, S1/S2 normal.   4. Tobacco abuse Discussed tobacco use. He is not ready to quit but is aware of the risks of smoking. Reviewed recommendation for lung cancer screening. Patient declined.   Procedures performed this visit: None.  Return in about 3 months (around 03/16/2023) for DM follow  up.  __________________________________ Thayer Ohm, DNP, APRN, FNP-BC Primary Care and Sports Medicine [facility] [place]

## 2022-12-14 NOTE — Telephone Encounter (Signed)
Prescription for oxycodone needs sent to [facility] on [address] in [place]. It was originally put in for mail delivery. Pt states needs ASAP

## 2022-12-14 NOTE — Telephone Encounter (Signed)
Prescription sent to [facility] as requested. Please contact [facility] pharmacy and cancel the Oxycodone prescription sent there today.   ___________________________________________ Thayer Ohm, DNP, APRN, FNP-BC Primary Care and Sports Medicine [facility] [place]

## 2022-12-14 NOTE — Telephone Encounter (Signed)
I called and cancelled mail order.

## 2022-12-21 ENCOUNTER — Other Ambulatory Visit: Payer: Self-pay | Admitting: Medical-Surgical

## 2022-12-28 DIAGNOSIS — H524 Presbyopia: Secondary | ICD-10-CM | POA: Diagnosis not present

## 2022-12-28 DIAGNOSIS — E119 Type 2 diabetes mellitus without complications: Secondary | ICD-10-CM | POA: Diagnosis not present

## 2022-12-28 LAB — HM DIABETES EYE EXAM

## 2023-01-11 ENCOUNTER — Telehealth: Payer: Self-pay

## 2023-01-11 NOTE — Telephone Encounter (Addendum)
Forwarding to [facility] as an FYI.  Okey Regal)  [employer] PAP shipment for [employer]  300 u (7 boxes) received this morning. Please contact the patient to come and pick up their order today. Placed in the fridge with patient identifier. Thanks in advance.   NDC: 4098-1191-47 LOT: [id number] EXP: 2025-03-07

## 2023-01-12 NOTE — Telephone Encounter (Signed)
Contacted patient and advised that his Toujeo is at the office and ready for him to pick up at his convenience.

## 2023-01-13 ENCOUNTER — Ambulatory Visit (INDEPENDENT_AMBULATORY_CARE_PROVIDER_SITE_OTHER): Payer: Medicare HMO | Admitting: Podiatry

## 2023-01-13 ENCOUNTER — Encounter: Payer: Self-pay | Admitting: Podiatry

## 2023-01-13 DIAGNOSIS — E1140 Type 2 diabetes mellitus with diabetic neuropathy, unspecified: Secondary | ICD-10-CM

## 2023-01-13 DIAGNOSIS — E1165 Type 2 diabetes mellitus with hyperglycemia: Secondary | ICD-10-CM | POA: Diagnosis not present

## 2023-01-13 DIAGNOSIS — B351 Tinea unguium: Secondary | ICD-10-CM | POA: Diagnosis not present

## 2023-01-13 DIAGNOSIS — M79674 Pain in right toe(s): Secondary | ICD-10-CM | POA: Diagnosis not present

## 2023-01-13 DIAGNOSIS — M79675 Pain in left toe(s): Secondary | ICD-10-CM

## 2023-01-13 DIAGNOSIS — I739 Peripheral vascular disease, unspecified: Secondary | ICD-10-CM | POA: Diagnosis not present

## 2023-01-13 NOTE — Telephone Encounter (Signed)
Patient came picked up all 7 boxes.

## 2023-01-13 NOTE — Progress Notes (Signed)
  Subjective:  Patient ID: Joseph Grimes, male    DOB: [birth year],   MRN: [id number]  Chief Complaint  Patient presents with   Nail Problem    Routine foot care      62 y.o. male presents for concern of thickened elongated and painful nails that are difficult to trim. Requesting to have them trimmed today. Relates burning and tingling in their feet. Smoker. Patient is diabetic and last A1c was  Lab Results  Component Value Date   HGBA1C 9.0 (A) 12/14/2022   HGBA1C 9.0 (A) 12/14/2022   .   PCP:  [name], NP     m  PCP: [name] NP   Past Medical History:  Diagnosis Date   CKD (chronic kidney disease) stage 3, GFR 30-59 ml/min (HCC) 10/11/2018   Coronary artery disease 10/11/2018   Diabetes (HCC)    Diabetes mellitus type 2, controlled, with complications (HCC) 10/11/2018   Essential hypertension 10/11/2018   GERD (gastroesophageal reflux disease)    Heart attack (HCC) 2006   stent placed in right coronary artery   High cholesterol    History of anemia 10/11/2018   History of low potassium    Low hemoglobin    Mixed hyperlipidemia 10/11/2018   Peripheral vascular disease (HCC) 10/11/2018   Stroke (HCC)    Tobacco dependence 10/11/2018    Objective:  Physical Exam: Vascular: DP/PT pulses 2/4 bilateral. CFT <3 seconds. Absent hair growth and xerosis noted to bilateral lower extremity., . No edema.  Skin. No lacerations or abrasions bilateral feet. Plantar third digit wound healed mild callus overlying. Hyperkeratotic tissue noted to posterior left heel. Nails 1-5 on left and 145 on right are thickened elongated and discolored with subungual debris.   Musculoskeletal: MMT 5/5 bilateral lower extremities in DF, PF, Inversion and Eversion. Deceased ROM in DF of ankle joint. 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  Neurological: Sensation intact to light touch.   Assessment:   1. Pain due to onychomycosis of toenails of both feet   2. Poorly controlled type 2 diabetes  mellitus with neuropathy (HCC)   3. Peripheral vascular disease (HCC)             Plan:  Patient was evaluated and treated and all questions answered.  -Discussed and educated patient on diabetic foot care, especially with  regards to the vascular, neurological and musculoskeletal systems.  -Stressed the importance of good glycemic control and the detriment of not  controlling glucose levels in relation to the foot. -Discussed supportive shoes at all times and checking feet regularly.  -Mechanically debrided all nails 1-5 bilateral using sterile nail nipper and filed with dremel without incident  -Answered all patient questions -Patient to return  in 3 months for at risk foot care -Patient advised to call the office if any problems or questions arise in the meantime.    Return in about 3 months (around 04/15/2023) for rfc.   Louann Sjogren, DPM

## 2023-01-20 ENCOUNTER — Encounter: Payer: Self-pay | Admitting: Medical-Surgical

## 2023-01-24 DIAGNOSIS — D500 Iron deficiency anemia secondary to blood loss (chronic): Secondary | ICD-10-CM | POA: Diagnosis not present

## 2023-01-24 DIAGNOSIS — N184 Chronic kidney disease, stage 4 (severe): Secondary | ICD-10-CM | POA: Diagnosis not present

## 2023-03-16 ENCOUNTER — Ambulatory Visit: Payer: Medicare HMO | Admitting: Medical-Surgical

## 2023-03-16 ENCOUNTER — Encounter: Payer: Self-pay | Admitting: Medical-Surgical

## 2023-03-16 VITALS — BP 109/69 | HR 70 | Ht 69.0 in | Wt 145.1 lb

## 2023-03-16 DIAGNOSIS — F1190 Opioid use, unspecified, uncomplicated: Secondary | ICD-10-CM

## 2023-03-16 DIAGNOSIS — Z720 Tobacco use: Secondary | ICD-10-CM | POA: Diagnosis not present

## 2023-03-16 DIAGNOSIS — I10 Essential (primary) hypertension: Secondary | ICD-10-CM

## 2023-03-16 DIAGNOSIS — Z794 Long term (current) use of insulin: Secondary | ICD-10-CM

## 2023-03-16 DIAGNOSIS — E1140 Type 2 diabetes mellitus with diabetic neuropathy, unspecified: Secondary | ICD-10-CM

## 2023-03-16 LAB — POCT GLYCOSYLATED HEMOGLOBIN (HGB A1C): Hemoglobin A1C: 8.6 % — AB (ref 4.0–5.6)

## 2023-03-16 LAB — POCT UA - MICROALBUMIN
Albumin/Creatinine Ratio, Urine, POC: >300
Creatinine, POC: 100 mg/dL
Microalbumin Ur, POC: 150 mg/L

## 2023-03-16 MED ORDER — OXYCODONE HCL 10 MG PO TABS
5.0000 mg | ORAL_TABLET | Freq: Four times a day (QID) | ORAL | 0 refills | Status: DC | PRN
Start: 2023-03-16 — End: 2023-03-17

## 2023-03-16 MED ORDER — INSULIN PEN NEEDLE 30G X 8 MM MISC: 11 refills | Status: DC

## 2023-03-16 MED ORDER — OXYCODONE HCL 10 MG PO TABS
5.0000 mg | ORAL_TABLET | Freq: Four times a day (QID) | ORAL | 0 refills | Status: DC | PRN
Start: 2023-03-16 — End: 2023-03-16

## 2023-03-16 NOTE — Progress Notes (Signed)
        Established patient visit  History, exam, impression, and plan:  1. Type 2 diabetes mellitus with diabetic neuropathy, with long-term current use of insulin [facility]) Pleasant 62 year old male presenting today with a history of type 2 diabetes.  He is currently taking Toujeo 30 units nightly.  Was previously prescribed Farxiga and was able to start this however he lost his bottle when he moved recently.  Has been checking sugars in the morning and has had several hypoglycemic episodes that were symptomatic.  Lowest blood sugars noted in the 40s.  Microalbumin abnormal today.  Titrate Toujeo down by 2 units twice weekly with a goal for fasting sugars of 90-120.  Restart Marcelline Deist once refill comes in from the patient assistance program. - POCT HgB A1C - POCT UA - Microalbumin  2. Chronic, continuous use of opioids Long history of chronic MSK pain.  Using oxycodone 5-10 mg on an as-needed basis with a supply of 30 tablets to last 90 days.  Refilling today. - Oxycodone HCl 10 MG TABS; Take 0.5-1 tablets (5-10 mg total) by mouth every 6 (six) hours as needed. #30 tabs to last 90 days. Next refill due 03/16/23  Dispense: 30 tablet; Refill: 0  3. Essential hypertension Currently taking valsartan 40 mg daily and using Lasix 20 mg daily as needed.  Blood pressure at goal today.  Denies concerning symptoms.  Cardiopulmonary exam normal.  Continue valsartan and Lasix as prescribed.  4. Tobacco abuse Long history of tobacco use.  Not interested in smoking cessation assistance.  No desire to quit at this time.  Discussed lung cancer screening as he meets the criteria however he declined this.  Procedures performed this visit: None.  Return in about 3 months (around 06/16/2023) for DM follow up.  __________________________________ Thayer Ohm, DNP, APRN, FNP-BC Primary Care and Sports Medicine [facility] [place]

## 2023-03-17 ENCOUNTER — Telehealth: Payer: Self-pay

## 2023-03-17 DIAGNOSIS — F1190 Opioid use, unspecified, uncomplicated: Secondary | ICD-10-CM

## 2023-03-17 MED ORDER — OXYCODONE HCL 10 MG PO TABS: 5.0000 mg | ORAL_TABLET | Freq: Four times a day (QID) | ORAL | 0 refills | Status: DC | PRN

## 2023-03-17 MED ORDER — INSULIN PEN NEEDLE 30G X 8 MM MISC: 11 refills | Status: DC

## 2023-03-17 NOTE — Telephone Encounter (Signed)
Diamonte called and states the Oxycodone and the pen needles did not cross over to the pharmacy.   Pended prescriptions.    Oxycodone HCl 10 MG TABS 30 tablet 0 03/16/2023 --   Sig - Route: Take 0.5-1 tablets (5-10 mg total) by mouth every 6 (six) hours as needed. #30 tabs to last 90 days. Next refill due 03/16/23 - Oral   Sent to pharmacy as: Oxycodone HCl 10 MG Tab   Earliest Fill Date: 03/16/2023   E-Prescribing Status: Transmission to pharmacy failed (03/16/2023 10:12 AM EDT)

## 2023-03-28 NOTE — Telephone Encounter (Signed)
Task completed by the provider on 03/17/23. Successful electronic transmission to the pharmacy.

## 2023-04-14 ENCOUNTER — Ambulatory Visit (INDEPENDENT_AMBULATORY_CARE_PROVIDER_SITE_OTHER): Payer: Medicare HMO | Admitting: Podiatry

## 2023-04-14 DIAGNOSIS — Z91199 Patient's noncompliance with other medical treatment and regimen due to unspecified reason: Secondary | ICD-10-CM

## 2023-04-14 NOTE — Progress Notes (Signed)
No show

## 2023-05-08 DIAGNOSIS — T502X1A Poisoning by carbonic-anhydrase inhibitors, benzothiadiazides and other diuretics, accidental (unintentional), initial encounter: Secondary | ICD-10-CM | POA: Diagnosis not present

## 2023-05-08 DIAGNOSIS — N184 Chronic kidney disease, stage 4 (severe): Secondary | ICD-10-CM | POA: Diagnosis not present

## 2023-05-08 DIAGNOSIS — N200 Calculus of kidney: Secondary | ICD-10-CM | POA: Diagnosis not present

## 2023-05-08 DIAGNOSIS — T501X1A Poisoning by loop [high-ceiling] diuretics, accidental (unintentional), initial encounter: Secondary | ICD-10-CM | POA: Diagnosis not present

## 2023-05-08 DIAGNOSIS — E278 Other specified disorders of adrenal gland: Secondary | ICD-10-CM | POA: Diagnosis not present

## 2023-05-08 DIAGNOSIS — I252 Old myocardial infarction: Secondary | ICD-10-CM | POA: Diagnosis not present

## 2023-05-08 DIAGNOSIS — I358 Other nonrheumatic aortic valve disorders: Secondary | ICD-10-CM | POA: Diagnosis not present

## 2023-05-08 DIAGNOSIS — J441 Chronic obstructive pulmonary disease with (acute) exacerbation: Secondary | ICD-10-CM | POA: Diagnosis not present

## 2023-05-08 DIAGNOSIS — D3500 Benign neoplasm of unspecified adrenal gland: Secondary | ICD-10-CM | POA: Diagnosis not present

## 2023-05-08 DIAGNOSIS — K8020 Calculus of gallbladder without cholecystitis without obstruction: Secondary | ICD-10-CM | POA: Diagnosis not present

## 2023-05-08 DIAGNOSIS — N281 Cyst of kidney, acquired: Secondary | ICD-10-CM | POA: Diagnosis not present

## 2023-05-08 DIAGNOSIS — N179 Acute kidney failure, unspecified: Secondary | ICD-10-CM | POA: Diagnosis not present

## 2023-05-08 DIAGNOSIS — E119 Type 2 diabetes mellitus without complications: Secondary | ICD-10-CM | POA: Diagnosis not present

## 2023-05-08 DIAGNOSIS — Z720 Tobacco use: Secondary | ICD-10-CM | POA: Diagnosis not present

## 2023-05-08 DIAGNOSIS — I129 Hypertensive chronic kidney disease with stage 1 through stage 4 chronic kidney disease, or unspecified chronic kidney disease: Secondary | ICD-10-CM | POA: Diagnosis not present

## 2023-05-08 DIAGNOSIS — R918 Other nonspecific abnormal finding of lung field: Secondary | ICD-10-CM | POA: Diagnosis not present

## 2023-05-08 DIAGNOSIS — R531 Weakness: Secondary | ICD-10-CM | POA: Diagnosis not present

## 2023-05-08 DIAGNOSIS — K529 Noninfective gastroenteritis and colitis, unspecified: Secondary | ICD-10-CM | POA: Diagnosis not present

## 2023-05-08 DIAGNOSIS — R0989 Other specified symptoms and signs involving the circulatory and respiratory systems: Secondary | ICD-10-CM | POA: Diagnosis not present

## 2023-05-08 DIAGNOSIS — R7989 Other specified abnormal findings of blood chemistry: Secondary | ICD-10-CM | POA: Diagnosis not present

## 2023-05-08 DIAGNOSIS — E876 Hypokalemia: Secondary | ICD-10-CM | POA: Diagnosis not present

## 2023-05-08 DIAGNOSIS — E279 Disorder of adrenal gland, unspecified: Secondary | ICD-10-CM | POA: Insufficient documentation

## 2023-05-08 DIAGNOSIS — M1711 Unilateral primary osteoarthritis, right knee: Secondary | ICD-10-CM | POA: Diagnosis not present

## 2023-05-08 DIAGNOSIS — E1122 Type 2 diabetes mellitus with diabetic chronic kidney disease: Secondary | ICD-10-CM | POA: Diagnosis not present

## 2023-05-08 DIAGNOSIS — I739 Peripheral vascular disease, unspecified: Secondary | ICD-10-CM | POA: Diagnosis not present

## 2023-05-08 DIAGNOSIS — I5032 Chronic diastolic (congestive) heart failure: Secondary | ICD-10-CM | POA: Diagnosis not present

## 2023-05-08 DIAGNOSIS — Z794 Long term (current) use of insulin: Secondary | ICD-10-CM | POA: Diagnosis not present

## 2023-05-08 DIAGNOSIS — E113313 Type 2 diabetes mellitus with moderate nonproliferative diabetic retinopathy with macular edema, bilateral: Secondary | ICD-10-CM | POA: Diagnosis not present

## 2023-05-08 DIAGNOSIS — E1151 Type 2 diabetes mellitus with diabetic peripheral angiopathy without gangrene: Secondary | ICD-10-CM | POA: Diagnosis not present

## 2023-05-08 DIAGNOSIS — E1165 Type 2 diabetes mellitus with hyperglycemia: Secondary | ICD-10-CM | POA: Diagnosis not present

## 2023-05-08 DIAGNOSIS — J811 Chronic pulmonary edema: Secondary | ICD-10-CM | POA: Diagnosis not present

## 2023-05-09 DIAGNOSIS — R531 Weakness: Secondary | ICD-10-CM | POA: Diagnosis not present

## 2023-05-09 DIAGNOSIS — D3500 Benign neoplasm of unspecified adrenal gland: Secondary | ICD-10-CM | POA: Diagnosis not present

## 2023-05-09 DIAGNOSIS — N179 Acute kidney failure, unspecified: Secondary | ICD-10-CM | POA: Diagnosis not present

## 2023-05-09 DIAGNOSIS — N184 Chronic kidney disease, stage 4 (severe): Secondary | ICD-10-CM | POA: Diagnosis not present

## 2023-05-09 DIAGNOSIS — E876 Hypokalemia: Secondary | ICD-10-CM | POA: Diagnosis not present

## 2023-05-10 DIAGNOSIS — N179 Acute kidney failure, unspecified: Secondary | ICD-10-CM | POA: Diagnosis not present

## 2023-05-10 DIAGNOSIS — I358 Other nonrheumatic aortic valve disorders: Secondary | ICD-10-CM | POA: Diagnosis not present

## 2023-05-11 DIAGNOSIS — N179 Acute kidney failure, unspecified: Secondary | ICD-10-CM | POA: Diagnosis not present

## 2023-05-12 DIAGNOSIS — N179 Acute kidney failure, unspecified: Secondary | ICD-10-CM | POA: Diagnosis not present

## 2023-05-13 DIAGNOSIS — N179 Acute kidney failure, unspecified: Secondary | ICD-10-CM | POA: Diagnosis not present

## 2023-05-17 ENCOUNTER — Telehealth: Payer: Self-pay

## 2023-05-17 NOTE — Transitions of Care (Post Inpatient/ED Visit) (Signed)
05/17/2023  Name: Joseph Grimes MRN: [id number] DOB: [birth year]  Today's TOC FU Call Status: Today's TOC FU Call Status:: Unsuccessful Call (1st Attempt) Unsuccessful Call (1st Attempt) Date: 05/17/23  Attempted to reach the patient regarding the most recent Inpatient/ED visit.  Follow Up Plan: Additional outreach attempts will be made to reach the patient to complete the Transitions of Care (Post Inpatient/ED visit) call.    Susa Loffler , BSN, RN Care Management Coordinator [place]   [facility] [email or web address] Direct Dial: [phone]

## 2023-05-18 ENCOUNTER — Encounter: Payer: Self-pay | Admitting: Family Medicine

## 2023-05-18 ENCOUNTER — Ambulatory Visit (INDEPENDENT_AMBULATORY_CARE_PROVIDER_SITE_OTHER): Payer: Medicare HMO | Admitting: Family Medicine

## 2023-05-18 ENCOUNTER — Telehealth: Payer: Self-pay

## 2023-05-18 VITALS — BP 116/62 | HR 90 | Ht 69.0 in | Wt 160.0 lb

## 2023-05-18 DIAGNOSIS — F1190 Opioid use, unspecified, uncomplicated: Secondary | ICD-10-CM | POA: Diagnosis not present

## 2023-05-18 DIAGNOSIS — R609 Edema, unspecified: Secondary | ICD-10-CM

## 2023-05-18 DIAGNOSIS — R600 Localized edema: Secondary | ICD-10-CM

## 2023-05-18 DIAGNOSIS — N179 Acute kidney failure, unspecified: Secondary | ICD-10-CM | POA: Diagnosis not present

## 2023-05-18 NOTE — Assessment & Plan Note (Addendum)
This is improving at time of discharge.  Repeat renal function.  Continue to hold furosemide.

## 2023-05-18 NOTE — Transitions of Care (Post Inpatient/ED Visit) (Signed)
05/18/2023  Name: Joseph Grimes MRN: [id number] DOB: [birth year]  Call placed to [facility] ( [phone] for follow-up on most recent home visit , details of this nurse call today and ongoing visit pan for patient Requested a return call . Contact info provided   Pt to e seen by PCP today due to changes in status since discharge   Susa Loffler , BSN, RN Care Management Coordinator [place]   [facility] [email or web address] Direct Dial: [phone]

## 2023-05-18 NOTE — Progress Notes (Signed)
Joseph Grimes - 62 y.o. male MRN [id number]  Date of birth: [birth year]  Subjective Chief Complaint  Patient presents with   Leg Swelling   spot on leg    HPI Joseph Grimes is a 62 y.o. male here today with complaint of leg swelling for a few weeks.   Seen recently in the hospital for weakness and leg swelling.  Added lasix for swelling but noted weakness after taking for a fe days.  Admitted for electrolyte disturbance, Acute on CKD, and elevated troponins.  Electrolytes repleted and hydrated.  Instructed to follow up with PCP in 1 weeks to have labs repeated.  Troponins were elevated however felt to be related to AKI as he denies chest pain or other anginal symptoms..  Stress test was offered during hospitalization but he declined.  Outpt f/u with cardiology recommended, he has appt on Thursday.   Today he complains of continued swelling.  Denies dyspnea or chest pain.  His legs are weeping and he has ulceration of the right leg.  He also complains of increase in his chronic pain.  He is prescribed oxycodone No. 30 for 90 days.  This is being managed by his PCP.    ROS:  A comprehensive ROS was completed and negative except as noted per HPI  Allergies  Allergen Reactions   Ozempic (0.25 Or 0.5 Mg-Dose) [Semaglutide(0.25 Or 0.5mg -Dos)] Diarrhea    Past Medical History:  Diagnosis Date   CKD (chronic kidney disease) stage 3, GFR 30-59 ml/min (HCC) 10/11/2018   Coronary artery disease 10/11/2018   Diabetes (HCC)    Diabetes mellitus type 2, controlled, with complications (HCC) 10/11/2018   Essential hypertension 10/11/2018   GERD (gastroesophageal reflux disease)    Heart attack (HCC) 2006   stent placed in right coronary artery   High cholesterol    History of anemia 10/11/2018   History of low potassium    Low hemoglobin    Mixed hyperlipidemia 10/11/2018   Peripheral vascular disease (HCC) 10/11/2018   Stroke (HCC)    Tobacco dependence 10/11/2018    Past Surgical History:  Procedure  Laterality Date   AMPUTATION Left 07/19/2019   Procedure: LEFT PARTIAL THUMB AMPUTATION;  Surgeon: Tarry Kos, MD;  Location: [facility];  Service: Orthopedics;  Laterality: Left;  with digital block   CARDIAC CATHETERIZATION  2006   04/18/18: no stents, stent in '06   FEMORAL-POPLITEAL BYPASS GRAFT Right 05/12/2017   HAND SURGERY     KNEE ARTHROSCOPY     bilat   TOE AMPUTATION     x2   TONSILLECTOMY      Social History   Socioeconomic History   Marital status: Widowed    Spouse name: Not on file   Number of children: 7   Years of education: 14   Highest education level: Associate degree: academic program  Occupational History   Occupation: Retired; Disabled.  Tobacco Use   Smoking status: Every Day    Current packs/day: 0.50    Average packs/day: 0.5 packs/day for 53.0 years (26.5 ttl pk-yrs)    Types: Cigarettes   Smokeless tobacco: Never   Tobacco comments:    refused  Vaping Use   Vaping status: Never Used  Substance and Sexual Activity   Alcohol use: Not Currently   Drug use: Never   Sexual activity: Not Currently    Partners: Female  Other Topics Concern   Not on file  Social History Narrative   Lives with daughter  and 2 grand children. Enjoys spending time with the grand children and watching TV.   Social Determinants of Health   Financial Resource Strain: Low Risk  (10/11/2022)   Overall Financial Resource Strain (CARDIA)    Difficulty of Paying Living Expenses: Not hard at all  Food Insecurity: No Food Insecurity (05/09/2023)   Received from [facility]   Hunger Vital Sign    Worried About Running Out of Food in the Last Year: Never true    Ran Out of Food in the Last Year: Never true  Transportation Needs: No Transportation Needs (05/09/2023)   Received from [facility] - Transportation    Lack of Transportation (Medical): No    Lack of Transportation (Non-Medical): No  Physical Activity: Inactive (05/18/2023)   Exercise  Vital Sign    Days of Exercise per Week: 0 days    Minutes of Exercise per Session: 0 min  Stress: No Stress Concern Present (05/09/2023)   Received from [facility] - Occupational Stress Questionnaire    Feeling of Stress : Only a little  Social Connections: Socially Isolated (10/11/2022)   Social Connection and Isolation Panel [NHANES]    Frequency of Communication with Friends and Family: More than three times a week    Frequency of Social Gatherings with Friends and Family: More than three times a week    Attends Religious Services: Never    Database administrator or Organizations: No    Attends Banker Meetings: Never    Marital Status: Widowed    Family History  Problem Relation Age of Onset   Diabetes Sister    Diabetes Brother    Heart disease Mother    Heart attack Father    Diabetes Father    Diabetes Sister     Health Maintenance  Topic Date Due   DTaP/Tdap/Td (1 - Tdap) Never done   Zoster Vaccines- Shingrix (1 of 2) Never done   INFLUENZA VACCINE  Never done   COVID-19 Vaccine (4 - 2023-24 season) 04/09/2023   Hepatitis C Screening  10/11/2023 (Originally 07/28/1979)   HIV Screening  10/11/2023 (Originally 07/28/1974)   Lung Cancer Screening  12/14/2023 (Originally 07/28/2009)   Diabetic kidney evaluation - eGFR measurement  09/16/2023   HEMOGLOBIN A1C  09/16/2023   Medicare Annual Wellness (AWV)  10/11/2023   FOOT EXAM  10/14/2023   OPHTHALMOLOGY EXAM  12/28/2023   Diabetic kidney evaluation - Urine ACR  03/15/2024   Colonoscopy  07/19/2027   HPV VACCINES  Aged Out     ----------------------------------------------------------------------------------------------------------------------------------------------------------------------------------------------------------------- Physical Exam BP 116/62 (BP Location: Left Arm, Patient Position: Sitting, Cuff Size: Normal)   Pulse 90   Ht 5\' 9"  (1.753 m)    Wt 160 lb (72.6 kg)   SpO2 98%   BMI 23.63 kg/m   Physical Exam Constitutional:      Appearance: Normal appearance.  HENT:     Head: Normocephalic and atraumatic.  Eyes:     General: No scleral icterus. Cardiovascular:     Rate and Rhythm: Normal rate and regular rhythm.  Pulmonary:     Effort: Pulmonary effort is normal.     Breath sounds: Normal breath sounds.  Neurological:     Mental Status: He is alert.  Psychiatric:        Mood and Affect: Mood normal.        Behavior: Behavior normal.     ------------------------------------------------------------------------------------------------------------------------------------------------------------------------------------------------------------------- Assessment and Plan  AKI (  acute kidney injury) (HCC) This is improving at time of discharge.  Repeat renal function.  Continue to hold furosemide.  Chronic, continuous use of opioids He is early for refill on his oxycodone.  Declined to refill opioids at this time as he was discharged with hydrocodone.  He can discuss this with his PCP at follow-up next week.  Lower extremity edema This is likely multifactorial.  His albumin levels are fairly low which is likely contributing.  Recommend increase in protein intake.  Unna boots applied bilaterally.  Recommend that he stay off furosemide for now.   No orders of the defined types were placed in this encounter.   Return in about 1 week (around 05/25/2023) for f/u with Joy for Edema/Chronic pain.    This visit occurred during the SARS-CoV-2 public health emergency.  Safety protocols were in place, including screening questions prior to the visit, additional usage of staff PPE, and extensive cleaning of exam room while observing appropriate contact time as indicated for disinfecting solutions.

## 2023-05-18 NOTE — Transitions of Care (Post Inpatient/ED Visit) (Signed)
05/18/2023  Name: Joseph Grimes MRN: [id number] DOB: Aug 30, 1960  Today's TOC FU Call Status: Today's TOC FU Call Status:: Successful TOC FU Call Completed TOC FU Call Complete Date: 05/18/23 Patient's Name and Date of Birth confirmed.  Transition Care Management Follow-up Telephone Call Date of Discharge: 05/13/23 Discharge Facility: Other ([facility]) Name of Other ([facility]) [facility]charge Facility: [facility] Type of Discharge: Inpatient Admission Primary Inpatient Discharge Diagnosis:: AKI  (Diureric use) How have you been since you were released from the hospital?: Worse (Diurerics were discontinued He now has BLE swelling with weeping ( Lymphorrhoea)) Any questions or concerns?: Yes Patient Questions/Concerns:: Meication changes , Lasix was discontinued , Multiple medication discrepencies Patient Questions/Concerns Addressed: Other: (Reiewed all medications . PCP today @ 10:50  , He will also request a med reconciliation, Recc elevating BLE, avoiding ambulation, cover area to keep dry,importance of eating his BG was 65, He is taking Insulin differently than noted on d/c summary)  Items Reviewed: Did you receive and understand the discharge instructions provided?: Yes Medications obtained,verified, and reconciled?: Yes (Medications Reviewed) Any new allergies since your discharge?: No Dietary orders reviewed?: Yes Type of Diet Ordered:: Heart Healthy, NAS due to HTN  CCHO is reccomended Do you have support at home?: Yes People in Home: child(ren), adult Name of Support/Comfort Primary Source: Joseph Grimes  Medications Reviewed Today: Medications Reviewed Today     Reviewed by Johnnette Barrios, RN (Registered Nurse) on 05/18/23 at [phone]  Med List Status: <None>   Medication Order Taking? Sig Documenting Provider Last Dose Status Informant  Accu-Chek Softclix Lancets lancets [id number] Yes  [provider] Taking Active   albuterol (VENTOLIN  HFA) 108 (90 Base) MCG/ACT inhaler [id number] Yes Inhale into the lungs every 6 (six) hours as needed for wheezing or shortness of breath. [provider] Taking Active            Med Note Sharon Seller, Mouhamad Teed L   Thu May 18, 2023  9:52 AM) 2 puffs  aspirin 81 MG tablet [id number] Yes Take 1 tablet (81 mg total) by mouth daily. [name], DO Taking Active   blood glucose meter kit and supplies [id number] Yes Dispense based on patient and insurance preference. Use up to four times daily as directed. (FOR ICD-10 E10.9, E11.9). [name], DO Taking Active            Med Note Sharon Seller, [name] L   Thu May 18, 2023  9:46 AM) Checks DAILY  ( is not checking 4 x day)  Blood Glucose Monitoring Suppl DEVI [id number] Yes 1 each by Does not apply route 2 (two) times daily as needed. May substitute to any manufacturer covered by patient's insurance. [name], NP Taking Active   cilostazol (PLETAL) 100 MG tablet [id number] Yes TAKE 1 TABLET TWICE DAILY [name], NP Taking Active   dapagliflozin propanediol (FARXIGA) 10 MG TABS tablet [id number] Yes Take 1 tablet (10 mg total) by mouth daily before breakfast. [name], NP Taking Active            Med Note Sharon Seller, [name] L   Thu May 18, 2023  9:47 AM) States this is a current medication, he did not take today  He needs a refill - ( on med program gets from PCP office )  FEROSUL 325 (65 Fe) MG tablet [id number] No TAKE 1 TABLET TWICE DAILY WITH MEALS  Patient not taking: Reported on 05/18/2023   [name], DO Not Taking Active  furosemide (LASIX) 20 MG tablet [id number] No TAKE 1 TABLET EVERY DAY  Patient not taking: Reported on 05/18/2023   [name], NP Not Taking Active            Med Note Sharon Seller, Kariel Skillman L   Thu May 18, 2023  9:48 AM) This was a current med discontinued @ recent hospital discharge due to self managed overuse )  Glucose Blood (BLOOD GLUCOSE TEST STRIPS) STRP [id number] Yes 1 each by In Vitro route 2 (two)  times daily. May substitute to any manufacturer covered by patient's insurance. [name], NP Taking Active            Med Note Sharon Seller, Cline Draheim L   Thu May 18, 2023  9:48 AM) Uses QD  Glucose Blood (BLOOD GLUCOSE TEST STRIPS) STRP [id number] Yes 1 each by In Vitro route 2 (two) times daily as needed. May substitute to any manufacturer covered by patient's insurance. [name], NP Taking Active   HYDROcodone-acetaminophen (NORCO/VICODIN) 5-325 MG tablet [id number] Yes Take 1 tablet by mouth every 4 (four) hours as needed for moderate pain. [provider] Taking Active Self  insulin glargine, 2 Unit Dial, (TOUJEO MAX SOLOSTAR) 300 UNIT/ML Solostar Pen [id number] Yes Inject 16 Units into the skin at bedtime. Increase by 4 units every 4 days until fasting (morning) blood sugar is <150, then stay at that dose.  Patient taking differently: Inject 35 Units into the skin at bedtime. Increase by 4 units every 4 days until fasting (morning) blood sugar is <150, then stay at that dose. (Taking 35 units daily as of 06/23/21)   [name], DO Taking Active            Med Note Sharon Seller, [name] L   Thu May 18, 2023  9:49 AM) States he takes 22 units at [facility]  Insulin Pen Needle (NOVOFINE) 30G X 8 MM MISC [id number] Yes Used to inject Toujeo insulin once daily as prescribed.  Please dispense appropriate brand and size to use with Toujeo max Solostar insulin pen. [name], NP Taking Active   Lancet Device MISC [id number] Yes 1 each by Does not apply route 2 (two) times daily. May substitute to any manufacturer covered by patient's insurance. [name], NP Taking Active   Lancet Device MISC [id number] Yes 1 each by Does not apply route 2 (two) times daily as needed. May substitute to any manufacturer covered by patient's insurance. [name], NP Taking Active   Lancets Misc. MISC [id number] Yes 1 each by Does not apply route 2 (two) times daily as needed. May substitute to any manufacturer covered by  patient's insurance. [name], NP Taking Active   metoprolol succinate (TOPROL-XL) 25 MG 24 hr tablet [id number] Yes Take 25 mg by mouth daily. [provider] Taking Active Self  MODERNA COVID-19 BIVAL BOOSTER 50 MCG/0.5ML injection [id number]   [provider]  Active   Oxycodone HCl 10 MG TABS [id number] Yes Take 0.5-1 tablets (5-10 mg total) by mouth every 6 (six) hours as needed. #30 tabs to last 90 days. Next refill due 03/16/23 [name], NP Taking Active   pantoprazole (PROTONIX) 40 MG tablet [id number] Yes TAKE 1 TABLET EVERY DAY [name], NP Taking Active            Med Note Sharon Seller, Shell Yandow L   Thu May 18, 2023  9:50 AM) States he uses q 3 days   rosuvastatin (CRESTOR) 40 MG tablet [id number] Yes TAKE 1 TABLET EVERY DAY (OFFICE VISIT  REQUIRED PRIOR TO ANY FURTHER REFILLS) [name], NP Taking Active   valsartan (DIOVAN) 40 MG tablet [id number] Yes Take 40 mg by mouth daily. [provider] Taking Active             Home Care and Equipment/Supplies: Were Home Health Services Ordered?: Yes Name of Home Health Agency:: [facility] [phone] He stated a Nurse and PT visited him 05/17/23 He reported [facility] Nurse did not review medications , look at LE or noted wounds ( right arm , heel or buttocks ) Has Agency set up a time to come to your home?: Yes First Home Health Visit Date: 05/17/23 Any new equipment or medical supplies ordered?: No  Functional Questionnaire: Do you need assistance with bathing/showering or dressing?: No Do you need assistance with meal preparation?: Yes (His Grimes prepares meals) Do you need assistance with eating?: No Do you have difficulty maintaining continence: No Do you need assistance with getting out of bed/getting out of a chair/moving?: No Do you have difficulty managing or taking your medications?: Yes (He was taking double or triple the Lasix doses to help decrease LE edema. He takes Insulin differently than noted and  takes Toujeo- which was not noted FS are QD vs QID. He keeps meds in original bottles  would benefit from [facility] med management techniques .)  Follow up appointments reviewed: PCP Follow-up appointment confirmed?: Yes Date of PCP follow-up appointment?: 05/18/23 Follow-up Provider: Everrett Coombe Specialist [facility] Follow-up appointment confirmed?: NA Do you need transportation to your follow-up appointment?: No (Grimes will transport)  SDOH Interventions Today    Flowsheet Row Most Recent Value  SDOH Interventions   Physical Activity Interventions Patient Declined       Goals Addressed             This Visit's Progress    TOC Care Plan       Current Barriers:  Knowledge Deficits related to plan of care for management of COPD, DMII, and BLE edema   Non-adherence to prescribed medication regimen  RNCM Clinical Goal(s):  Patient will work with the Care Management team over the next 30 days to address Transition of Care Barriers: Medication Management Provider appointments Home Health services verbalize basic understanding of  DMII, Tobacco Use, and Causes and interventions for LE edema disease process and self health management plan as evidenced by taking medications as directed, decreasing smoking,monitoring LE edema, checking weights and following interventions take all medications exactly as prescribed and will call provider for medication related questions as evidenced by Providers notified timely of changes in condition (HH Nurse, [facility] CM Nurse PCP) attend all scheduled medical appointments:   as evidenced by no missed appts with PCP , HH  and no missed calls with [facility] CM Nurse   through collaboration with RN Care manager, provider, and care team.   Interventions: Evaluation of current treatment plan related to  self management and patient's adherence to plan as established by provider  Transitions of Care:  New goal. Doctor Visits  - discussed the importance of doctor  visits Communication with CM and Home Health re: any changes in general status, questions about medications   Patient Goals/Self-Care Activities: Participate in Transition of Care Program/Attend TOC scheduled calls Take all medications as prescribed Attend all scheduled provider appointments Call pharmacy for medication refills 3-7 days in advance of running out of medications Call provider office for new concerns or questions   Follow Up Plan:  Telephone follow up appointment with care management team  member scheduled for:  05/24/23 @ 2:30pm The patient has been provided with contact information for the care management team and has been advised to call with any health related questions or concerns.  The patient will call [facility] CM Nurse, [facility] Nurse or PCP office  as advised to for any change in condition, status or medication questions .  Follow up with provider re: 05/18/23 @ 10:50           Susa Loffler , BSN, RN Care Management Coordinator [place]   [facility] [email or web address] Direct Dial: [phone]

## 2023-05-18 NOTE — Patient Instructions (Addendum)
Leave wraps on until follow up next week.  Your protein levels are low-Increase protein intake.  Recommend boost/ensure to help with this.  Low protein can cause more swelling.  See Joy in 1 week.Marland Kitchen

## 2023-05-18 NOTE — Assessment & Plan Note (Signed)
This is likely multifactorial.  His albumin levels are fairly low which is likely contributing.  Recommend increase in protein intake.  Unna boots applied bilaterally.  Recommend that he stay off furosemide for now.

## 2023-05-18 NOTE — Assessment & Plan Note (Signed)
He is early for refill on his oxycodone.  Declined to refill opioids at this time as he was discharged with hydrocodone.  He can discuss this with his PCP at follow-up next week.

## 2023-05-19 DIAGNOSIS — M7989 Other specified soft tissue disorders: Secondary | ICD-10-CM | POA: Diagnosis not present

## 2023-05-19 DIAGNOSIS — J439 Emphysema, unspecified: Secondary | ICD-10-CM | POA: Diagnosis not present

## 2023-05-19 DIAGNOSIS — E1151 Type 2 diabetes mellitus with diabetic peripheral angiopathy without gangrene: Secondary | ICD-10-CM | POA: Diagnosis not present

## 2023-05-19 DIAGNOSIS — R601 Generalized edema: Secondary | ICD-10-CM | POA: Diagnosis not present

## 2023-05-19 DIAGNOSIS — I509 Heart failure, unspecified: Secondary | ICD-10-CM | POA: Diagnosis not present

## 2023-05-19 DIAGNOSIS — E1165 Type 2 diabetes mellitus with hyperglycemia: Secondary | ICD-10-CM | POA: Diagnosis not present

## 2023-05-19 DIAGNOSIS — Z794 Long term (current) use of insulin: Secondary | ICD-10-CM | POA: Diagnosis not present

## 2023-05-19 DIAGNOSIS — D631 Anemia in chronic kidney disease: Secondary | ICD-10-CM | POA: Diagnosis not present

## 2023-05-19 DIAGNOSIS — J209 Acute bronchitis, unspecified: Secondary | ICD-10-CM | POA: Diagnosis not present

## 2023-05-19 DIAGNOSIS — J40 Bronchitis, not specified as acute or chronic: Secondary | ICD-10-CM | POA: Diagnosis not present

## 2023-05-19 DIAGNOSIS — I2510 Atherosclerotic heart disease of native coronary artery without angina pectoris: Secondary | ICD-10-CM | POA: Diagnosis not present

## 2023-05-19 DIAGNOSIS — R0602 Shortness of breath: Secondary | ICD-10-CM | POA: Diagnosis not present

## 2023-05-19 DIAGNOSIS — R9431 Abnormal electrocardiogram [ECG] [EKG]: Secondary | ICD-10-CM | POA: Diagnosis not present

## 2023-05-19 DIAGNOSIS — I4510 Unspecified right bundle-branch block: Secondary | ICD-10-CM | POA: Diagnosis not present

## 2023-05-19 DIAGNOSIS — E1122 Type 2 diabetes mellitus with diabetic chronic kidney disease: Secondary | ICD-10-CM | POA: Diagnosis not present

## 2023-05-19 DIAGNOSIS — I130 Hypertensive heart and chronic kidney disease with heart failure and stage 1 through stage 4 chronic kidney disease, or unspecified chronic kidney disease: Secondary | ICD-10-CM | POA: Diagnosis not present

## 2023-05-19 DIAGNOSIS — F17210 Nicotine dependence, cigarettes, uncomplicated: Secondary | ICD-10-CM | POA: Diagnosis not present

## 2023-05-19 DIAGNOSIS — I5033 Acute on chronic diastolic (congestive) heart failure: Secondary | ICD-10-CM | POA: Diagnosis not present

## 2023-05-19 DIAGNOSIS — N184 Chronic kidney disease, stage 4 (severe): Secondary | ICD-10-CM | POA: Diagnosis not present

## 2023-05-19 LAB — CMP14+EGFR
ALT: 22 [IU]/L (ref 0–44)
AST: 18 [IU]/L (ref 0–40)
Albumin: 2.6 g/dL — ABNORMAL LOW (ref 3.9–4.9)
Alkaline Phosphatase: 156 [IU]/L — ABNORMAL HIGH (ref 44–121)
BUN/Creatinine Ratio: 18 (ref 10–24)
BUN: 48 mg/dL — ABNORMAL HIGH (ref 8–27)
Bilirubin Total: 0.3 mg/dL (ref 0.0–1.2)
CO2: 21 mmol/L (ref 20–29)
Calcium: 7.8 mg/dL — ABNORMAL LOW (ref 8.6–10.2)
Chloride: 107 mmol/L — ABNORMAL HIGH (ref 96–106)
Creatinine, Ser: 2.63 mg/dL — ABNORMAL HIGH (ref 0.76–1.27)
Globulin, Total: 3.2 g/dL (ref 1.5–4.5)
Glucose: 111 mg/dL — ABNORMAL HIGH (ref 70–99)
Potassium: 4.2 mmol/L (ref 3.5–5.2)
Sodium: 142 mmol/L (ref 134–144)
Total Protein: 5.8 g/dL — ABNORMAL LOW (ref 6.0–8.5)
eGFR: 27 mL/min/{1.73_m2} — ABNORMAL LOW (ref >59)

## 2023-05-19 LAB — CBC WITH DIFFERENTIAL/PLATELET
Basophils Absolute: 0 10*3/uL (ref 0.0–0.2)
Basos: 0 %
EOS (ABSOLUTE): 0.3 10*3/uL (ref 0.0–0.4)
Eos: 2 %
Hematocrit: 27.7 % — ABNORMAL LOW (ref 37.5–51.0)
Hemoglobin: 8.9 g/dL — ABNORMAL LOW (ref 13.0–17.7)
Immature Grans (Abs): 0.2 10*3/uL — ABNORMAL HIGH (ref 0.0–0.1)
Immature Granulocytes: 1 %
Lymphocytes Absolute: 0.9 10*3/uL (ref 0.7–3.1)
Lymphs: 7 %
MCH: 30.9 pg (ref 26.6–33.0)
MCHC: 32.1 g/dL (ref 31.5–35.7)
MCV: 96 fL (ref 79–97)
Monocytes Absolute: 1.1 10*3/uL — ABNORMAL HIGH (ref 0.1–0.9)
Monocytes: 8 %
Neutrophils Absolute: 10.4 10*3/uL — ABNORMAL HIGH (ref 1.4–7.0)
Neutrophils: 82 %
Platelets: 296 10*3/uL (ref 150–450)
RBC: 2.88 x10E6/uL — ABNORMAL LOW (ref 4.14–5.80)
RDW: 12.4 % (ref 11.6–15.4)
WBC: 12.7 10*3/uL — ABNORMAL HIGH (ref 3.4–10.8)

## 2023-05-24 ENCOUNTER — Ambulatory Visit: Payer: Self-pay | Admitting: *Deleted

## 2023-05-25 DIAGNOSIS — Z1152 Encounter for screening for COVID-19: Secondary | ICD-10-CM | POA: Diagnosis not present

## 2023-05-25 DIAGNOSIS — R059 Cough, unspecified: Secondary | ICD-10-CM | POA: Diagnosis not present

## 2023-05-25 DIAGNOSIS — Z955 Presence of coronary angioplasty implant and graft: Secondary | ICD-10-CM | POA: Diagnosis not present

## 2023-05-25 DIAGNOSIS — F17210 Nicotine dependence, cigarettes, uncomplicated: Secondary | ICD-10-CM | POA: Diagnosis not present

## 2023-05-25 DIAGNOSIS — J40 Bronchitis, not specified as acute or chronic: Secondary | ICD-10-CM | POA: Diagnosis not present

## 2023-05-25 DIAGNOSIS — E1159 Type 2 diabetes mellitus with other circulatory complications: Secondary | ICD-10-CM | POA: Diagnosis not present

## 2023-05-25 DIAGNOSIS — R0602 Shortness of breath: Secondary | ICD-10-CM | POA: Diagnosis not present

## 2023-05-25 DIAGNOSIS — Z794 Long term (current) use of insulin: Secondary | ICD-10-CM | POA: Diagnosis not present

## 2023-05-25 DIAGNOSIS — I509 Heart failure, unspecified: Secondary | ICD-10-CM | POA: Diagnosis not present

## 2023-05-25 DIAGNOSIS — I739 Peripheral vascular disease, unspecified: Secondary | ICD-10-CM | POA: Diagnosis not present

## 2023-05-25 DIAGNOSIS — I130 Hypertensive heart and chronic kidney disease with heart failure and stage 1 through stage 4 chronic kidney disease, or unspecified chronic kidney disease: Secondary | ICD-10-CM | POA: Diagnosis not present

## 2023-05-25 DIAGNOSIS — I110 Hypertensive heart disease with heart failure: Secondary | ICD-10-CM | POA: Diagnosis not present

## 2023-05-25 DIAGNOSIS — Z8673 Personal history of transient ischemic attack (TIA), and cerebral infarction without residual deficits: Secondary | ICD-10-CM | POA: Diagnosis not present

## 2023-05-25 DIAGNOSIS — E1122 Type 2 diabetes mellitus with diabetic chronic kidney disease: Secondary | ICD-10-CM | POA: Diagnosis not present

## 2023-05-25 DIAGNOSIS — N184 Chronic kidney disease, stage 4 (severe): Secondary | ICD-10-CM | POA: Diagnosis not present

## 2023-05-25 DIAGNOSIS — I252 Old myocardial infarction: Secondary | ICD-10-CM | POA: Diagnosis not present

## 2023-05-25 DIAGNOSIS — I2510 Atherosclerotic heart disease of native coronary artery without angina pectoris: Secondary | ICD-10-CM | POA: Diagnosis not present

## 2023-05-25 DIAGNOSIS — I5033 Acute on chronic diastolic (congestive) heart failure: Secondary | ICD-10-CM | POA: Diagnosis not present

## 2023-05-25 DIAGNOSIS — Z720 Tobacco use: Secondary | ICD-10-CM | POA: Diagnosis not present

## 2023-05-25 NOTE — Transitions of Care (Post Inpatient/ED Visit) (Signed)
05/25/2023  Name: Joseph Grimes MRN: [id number] DOB: 01/17/1961  Today's TOC FU Call Status: Today's TOC FU Call Status:: Successful TOC FU Call Completed TOC FU Call Complete Date: 05/24/23 Patient's Name and Date of Birth confirmed.  Transition Care Management Follow-up Telephone Call Date of Discharge: 05/24/23 Discharge Facility: Other ([facility]) Name of Other ([facility]) Discharge Facility: [facility] Type of Discharge: Inpatient Admission Primary Inpatient Discharge Diagnosis:: Acute on Chronic CHF How have you been since you were released from the hospital?: Better ("I think I am okay now, I hope so.  I had to go back to the hospital because they stopped my diuretic with the first hospital visit.... and I got to where I could not breather and my legs were terribly swollen. I have started back on the fluid pills now") Any questions or concerns?: No  Items Reviewed: Did you receive and understand the discharge instructions provided?: Yes (briefly reviewed with patient who verbalizes good understanding of same - outside hospital AVS) Medications obtained,verified, and reconciled?: Yes (Medications Reviewed) (Full medication reconciliation/ review completed; no concerns or discrepancies identified; confirmed patient obtained/ is taking all newly Rx'd medications as instructed; self-manages medications and denies questions/ concerns around medications today) Any new allergies since your discharge?: No Dietary orders reviewed?: Yes Type of Diet Ordered:: "As healthy as I can;" reviewed with patient importance of trying to follow low salt, heart healthy diet Do you have support at home?: Yes People in Home: child(ren), adult Name of Support/Comfort Primary Source: Reports independent in self-care activities; resides with supportive adult daughter-- assists as/ if needed/ indicated  Medications Reviewed Today: Medications Reviewed Today     Reviewed by Michaela Corner, RN (Registered Nurse) on 05/24/23 at 1539  Med List Status: <None>   Medication Order Taking? Sig Documenting Provider Last Dose Status Informant  Accu-Chek Softclix Lancets lancets [id number] Yes  [provider] Taking Active   albuterol (VENTOLIN HFA) 108 (90 Base) MCG/ACT inhaler [id number] Yes Inhale into the lungs every 6 (six) hours as needed for wheezing or shortness of breath. [provider] Taking Active            Med Note Sharon Seller, CRYSTAL L   Thu May 18, 2023  9:52 AM) 2 puffs  aspirin 81 MG tablet [id number] Yes Take 1 tablet (81 mg total) by mouth daily. [name], DO Taking Active   blood glucose meter kit and supplies [id number] Yes Dispense based on patient and insurance preference. Use up to four times daily as directed. (FOR ICD-10 E10.9, E11.9). [name], DO Taking Active            Med Note Sharon Seller, CRYSTAL L   Thu May 18, 2023  9:46 AM) Checks DAILY  ( is not checking 4 x day)  Blood Glucose Monitoring Suppl DEVI [id number] Yes 1 each by Does not apply route 2 (two) times daily as needed. May substitute to any manufacturer covered by patient's insurance. [name], NP Taking Active   cilostazol (PLETAL) 100 MG tablet [id number] Yes TAKE 1 TABLET TWICE DAILY [name], NP Taking Active   dapagliflozin propanediol (FARXIGA) 10 MG TABS tablet [id number] No Take 1 tablet (10 mg total) by mouth daily before breakfast.  Patient not taking: Reported on 05/18/2023   [name], NP Not Taking Active            Med Note [name] May 24, 2023  3:35 PM) 05/24/23: Reports during [facility] call  today he "has been out of this medication for awhile;" reports he was receiving this medication from established PAP, but "misplaced" 2 of the bottles in a recent "move;" states he has not been taking recently- patient unsure of drug manufacturer-- stated "my doctor arranged it for me"   furosemide (LASIX) 20 MG tablet [id number] Yes TAKE 1 TABLET  EVERY DAY [name], NP Taking Active            Med Note Michaela Corner   Wed May 24, 2023  3:36 PM) 05/24/23: reports during [facility] call today that this medication has now been restarted as of 05/24/23 discharge from [facility]   Glucose Blood (BLOOD GLUCOSE TEST STRIPS) STRP [id number] Yes 1 each by In Vitro route 2 (two) times daily. May substitute to any manufacturer covered by patient's insurance. [name], NP Taking Active            Med Note Sharon Seller, CRYSTAL L   Thu May 18, 2023  9:48 AM) Uses QD  Glucose Blood (BLOOD GLUCOSE TEST STRIPS) STRP [id number] Yes 1 each by In Vitro route 2 (two) times daily as needed. May substitute to any manufacturer covered by patient's insurance. [name], NP Taking Active   insulin glargine, 2 Unit Dial, (TOUJEO MAX SOLOSTAR) 300 UNIT/ML Solostar Pen [id number] Yes Inject 16 Units into the skin at bedtime. Increase by 4 units every 4 days until fasting (morning) blood sugar is <150, then stay at that dose.  Patient taking differently: Inject 35 Units into the skin at bedtime. Increase by 4 units every 4 days until fasting (morning) blood sugar is <150, then stay at that dose. (Taking 35 units daily as of 06/23/21)   [name], DO Taking Active            Med Note [name] May 24, 2023  3:37 PM) 05/24/23: reports during [facility] call today that he is currently taking 20-22 Units at bedtime   Insulin Pen Needle (NOVOFINE) 30G X 8 MM MISC [id number] Yes Used to inject Toujeo insulin once daily as prescribed.  Please dispense appropriate brand and size to use with Toujeo max Solostar insulin pen. [name], NP Taking Active   Lancet Device MISC [id number] Yes 1 each by Does not apply route 2 (two) times daily. May substitute to any manufacturer covered by patient's insurance. [name], NP Taking Active   Lancet Device MISC [id number] Yes 1 each by Does not apply route 2 (two) times daily as needed. May substitute to any  manufacturer covered by patient's insurance. [name], NP Taking Active   Lancets Misc. MISC [id number] Yes 1 each by Does not apply route 2 (two) times daily as needed. May substitute to any manufacturer covered by patient's insurance. [name], NP Taking Active   metoprolol succinate (TOPROL-XL) 25 MG 24 hr tablet [id number] No Take 25 mg by mouth daily.  Patient not taking: Reported on 05/24/2023   [provider] Not Taking Active Self           Med Note [name] May 24, 2023  3:38 PM) 05/24/23: reports during [facility] call today: "this has been stopped for awhile now;" unable to tell me who stopped this medication  Oxycodone HCl 10 MG TABS [id number] Yes Take 0.5-1 tablets (5-10 mg total) by mouth every 6 (six) hours as needed. #30 tabs to last 90 days. Next refill due 03/16/23 [name], NP Taking Active  Med Note [name] May 24, 2023  3:39 PM) 05/24/23: reports during [facility] call, "I have one pill left"  pantoprazole (PROTONIX) 40 MG tablet [id number] Yes TAKE 1 TABLET EVERY DAY [name], NP Taking Active            Med Note Sharon Seller, CRYSTAL L   Thu May 18, 2023  9:50 AM) States he uses q 3 days   rosuvastatin (CRESTOR) 40 MG tablet [id number] Yes TAKE 1 TABLET EVERY DAY (OFFICE VISIT REQUIRED PRIOR TO ANY FURTHER REFILLS) [name], NP Taking Active            Home Care and Equipment/Supplies: Name of Home Health Agency:: Confirms he has spoken with home health RN at [facility], "a few minutes ago;" and she will call him "soon" to schedule home visit for resumption of care Has Agency set up a time to come to your home?: Yes First Home Health Visit Date:  (Patient reports "she told me she would call me soon to schedule a home visit by the end of the week") Any new equipment or medical supplies ordered?: No  Functional Questionnaire: Do you need assistance with bathing/showering or dressing?: No Do you need assistance with meal  preparation?: No Do you need assistance with eating?: No Do you have difficulty maintaining continence: No Do you need assistance with getting out of bed/getting out of a chair/moving?: No Do you have difficulty managing or taking your medications?: No (Today, patient denies challenges around medication management "now that I am back on the fluid pills;" encouraged patient to ensure that home health nnurse and PCP assist closely with managing his medications after most recent hospital discharge today)  Follow up appointments reviewed: PCP Follow-up appointment confirmed?: Yes Date of PCP follow-up appointment?: 05/26/23 Follow-up Provider: PCP- NP [facility] Follow-up appointment confirmed?: NA (verified not indicated per hospital discharging provider discharge notes) Do you need transportation to your follow-up appointment?: No Do you understand care options if your condition(s) worsen?: Yes-patient verbalized understanding  SDOH Interventions Today    Flowsheet Row Most Recent Value  SDOH Interventions   Food Insecurity Interventions Intervention Not Indicated  Housing Interventions Intervention Not Indicated  [reports resides with adult daughter]  Transportation Interventions Intervention Not Indicated  [reports adult daughter provides transportation]  Health Literacy Interventions Other (Comment)  [communicated with PCP to address need for ongoing reinforcement around medications, overall plan of care- has HFU office visit scheduled for 05/26/23,  re-enrolled into TOC 30-day program]        Goals Addressed             This Visit's Progress    TOC 30-day Program Care Plan- start date 05/24/23   On track    Current Barriers:  Medication management ongoing inconsistent medication management concerns Provider appointments Friday 05/26/23 with PCP Home Health services to be resumed by [facility]: patient confirmed today that he has spoken to Home Health RN  "April" since his hospital discharge earlier today  30-day readmission  RNCM Clinical Goal(s):  Patient will work with the Care Management team over the next 30 days to address Transition of Care Barriers: 30-day readmission: CKD IV vs. CHF management verbalize understanding of plan for management of CHF and CKD Stage IV as evidenced by patient verbalization of same during TOC 30-day program outreaches take all medications exactly as prescribed and will call provider for medication related questions as evidenced by patient reporting of same during [facility] RN CM  30-day program outreaches attend all scheduled medical appointments: 05/26/23- PCP as evidenced by collaboration with PCP; review of EHR; review of provider visits with patient during 30-day TOC program weekly outreaches work with home health services to assist with management of LE swelling, medications as evidenced by patient reporting and collaboration with home health team as indicated, during [facility] 30-day program weekly outreaches not experience hospital admission as evidenced by review of EMR. Hospital Admissions in last 6 months = 2-- within 30-days of each other  through collaboration with RN Care manager, provider, and care team.   Interventions: Evaluation of current treatment plan related to  self management and patient's adherence to plan as established by provider Discussed current clinical condition: reports "much better now;" states "I did exactly what they told me to" after first hospital discharge and HFU PCP visit; states "I hope I don't have to go back to the hospital again and am glad they put me back on fluid pills"  Transitions of Care:  New goal. 05/24/23 Durable Medical Equipment (DME) needs assessed with patient/caregiver, options discussed with patient/caregiver, and confirmed patient currently using walker for ambulation; confirmed patient does not currently have scales for in-home use: encouraged him to contact his  insurance company to see if there is a benefit for scales in setting of CHF-- patient is agreeable-- we will re-visit during next week's call Doctor Visits  - discussed the importance of doctor visits Contacted provider for patient needs notified PCP of 30-day readmission, need for scales, ongoing barriers around consistent management of medications in setting of CKD IV and CHF Discussed rationale for daily weight monitoring at home along with weight gain guidelines/ action plan for weight gain; importance of taking diuretic as prescribed  Confirmed patient is NOT currently obtaining daily weights at home: provided education / rationale for daily weight monitoring at home as earliest indicator of fluid retention, along with weight gain guidelines/ action plan for weight gain- we will work to get patient scales at his home for initiation of in-home daily weights Reviewed upcoming provider office visits: confirmed patient is aware of all and has plans to attend as scheduled: PCP 05/26/23 Reinforced/ provided education around basics of following heart healthy low salt diet  Patient Goals/Self-Care Activities: Participate in Transition of Care Program/Attend TOC scheduled calls Take all medications as prescribed Attend all scheduled provider appointments Call provider office for new concerns or questions  Use assistive devices as needed to prevent falls Consider weighing yourself every day to stay on top of early fluid retention: write down your weights every day so you remember what it is from day to day-- as we discussed today, please contact your insurance company to determine if they have a benefit with your plan for you to receive scales you can use at home Take your fluid pills exactly as they are prescribed Continue to follow your established action plan for episodes of shortness of breath- use your inhalers as prescribed Continue working with the home health team that is involved in your  care If you believe your condition is getting worse- contact your care providers (doctors) promptly- reaching out to your doctor early when you have concerns can prevent you from having to go to the hospital Eat a heart healthy and low salt diet  Follow Up Plan:  Telephone follow up appointment with care management team member scheduled for:  Thursday 06/01/23 at 3:30 pm       COMPLETED: [facility] Care Plan  05/24/23: Care plan completed today due to hospital re-admission within 30-days of last discharge: new care plan initiated    Current Barriers:  Knowledge Deficits related to plan of care for management of COPD, DMII, and BLE edema   Non-adherence to prescribed medication regimen  RNCM Clinical Goal(s):  Patient will work with the Care Management team over the next 30 days to address Transition of Care Barriers: Medication Management Provider appointments Home Health services verbalize basic understanding of  DMII, Tobacco Use, and Causes and interventions for LE edema disease process and self health management plan as evidenced by taking medications as directed, decreasing smoking,monitoring LE edema, checking weights and following interventions take all medications exactly as prescribed and will call provider for medication related questions as evidenced by Providers notified timely of changes in condition (HH Nurse, [facility] CM Nurse PCP) attend all scheduled medical appointments:   as evidenced by no missed appts with PCP , HH  and no missed calls with [facility] CM Nurse   through collaboration with RN Care manager, provider, and care team.   Interventions: Evaluation of current treatment plan related to  self management and patient's adherence to plan as established by provider  Transitions of Care:  New goal. Doctor Visits  - discussed the importance of doctor visits Communication with CM and Home Health re: any changes in general status, questions about medications   Patient  Goals/Self-Care Activities: Participate in Transition of Care Program/Attend TOC scheduled calls Take all medications as prescribed Attend all scheduled provider appointments Call pharmacy for medication refills 3-7 days in advance of running out of medications Call provider office for new concerns or questions   Follow Up Plan:  Telephone follow up appointment with care management team member scheduled for:  05/24/23 @ 2:30pm The patient has been provided with contact information for the care management team and has been advised to call with any health related questions or concerns.  The patient will call [facility] CM Nurse, [facility] Nurse or PCP office  as advised to for any change in condition, status or medication questions .  Follow up with provider re: 05/18/23 @ 10:50          Caryl Pina, RN, BSN, CCRN Alumnus RN Care Manager  Transitions of Care  [facility] - [facility] [phone]: direct office

## 2023-05-25 NOTE — Patient Instructions (Signed)
Visit Information   Thank you for taking time to visit with me today. Please don't hesitate to contact me if I can be of assistance to you before our next scheduled telephone appointment.  Following are the goals we discussed today:   Patient Goals/Self-Care Activities: Participate in Transition of Care Program/Attend TOC scheduled calls Take all medications as prescribed Attend all scheduled provider appointments Call provider office for new concerns or questions  Use assistive devices as needed to prevent falls Consider weighing yourself every day to stay on top of early fluid retention: write down your weights every day so you remember what it is from day to day-- as we discussed today, please contact your insurance company to determine if they have a benefit with your plan for you to receive scales you can use at home Take your fluid pills exactly as they are prescribed Continue to follow your established action plan for episodes of shortness of breath- use your inhalers as prescribed Continue working with the home health team that is involved in your care If you believe your condition is getting worse- contact your care providers (doctors) promptly- reaching out to your doctor early when you have concerns can prevent you from having to go to the hospital Eat a heart healthy and low salt diet  Our next appointment is by telephone on Thursday 06/01/23 at 3:30 pm  Caryl Pina, RN, BSN, CCRN Alumnus RN Care Manager  Transitions of Care  [facility] - Population Health   [phone]: direct office   Please call the care guide team at [phone] if you need to cancel or reschedule your appointment.   If you are experiencing a Mental Health or Behavioral Health Crisis or need someone to talk to, please  call the Suicide and Crisis Lifeline: 988 call the [place] National Suicide Prevention Lifeline: 443 212 1186 or TTY: 204-096-2968 TTY 913 888 2054) to talk to a  trained counselor call 1-800-273-TALK (toll free, 24 hour hotline) go to [facility] [address], [place] [phone]) call the [facility]: [phone] call 911   Following is a copy of your full care plan:  There are no care plans that you recently modified to display for this patient.  Consent to CCM Services: Mr. Mose was given information about Chronic Care Management services including:  CCM service includes personalized support from designated clinical staff supervised by his physician, including individualized plan of care and coordination with other care providers 24/7 contact phone numbers for assistance for urgent and routine care needs. Service will only be billed when office clinical staff spend 20 minutes or more in a month to coordinate care. Only one practitioner may furnish and bill the service in a calendar month. The patient may stop CCM services at any time (effective at the end of the month) by phone call to the office staff. The patient will be responsible for cost sharing (co-pay) of up to 20% of the service fee (after annual deductible is met).  Patient agreed to services and verbal consent obtained.   Patient verbalizes understanding of instructions and care plan provided today and agrees to view in MyChart. Active MyChart status and patient understanding of how to access instructions and care plan via MyChart confirmed with patient.     Telephone follow up appointment with care management team member scheduled for:  Thursday 06/01/23 at 3:30 pm

## 2023-05-26 ENCOUNTER — Ambulatory Visit: Payer: Medicare HMO | Admitting: Medical-Surgical

## 2023-05-26 DIAGNOSIS — N184 Chronic kidney disease, stage 4 (severe): Secondary | ICD-10-CM | POA: Diagnosis not present

## 2023-05-26 DIAGNOSIS — Z8673 Personal history of transient ischemic attack (TIA), and cerebral infarction without residual deficits: Secondary | ICD-10-CM | POA: Diagnosis not present

## 2023-05-26 DIAGNOSIS — I152 Hypertension secondary to endocrine disorders: Secondary | ICD-10-CM | POA: Diagnosis not present

## 2023-05-26 DIAGNOSIS — Z955 Presence of coronary angioplasty implant and graft: Secondary | ICD-10-CM | POA: Diagnosis not present

## 2023-05-26 DIAGNOSIS — E1159 Type 2 diabetes mellitus with other circulatory complications: Secondary | ICD-10-CM | POA: Diagnosis not present

## 2023-05-26 DIAGNOSIS — I5033 Acute on chronic diastolic (congestive) heart failure: Secondary | ICD-10-CM | POA: Diagnosis not present

## 2023-05-26 DIAGNOSIS — Z794 Long term (current) use of insulin: Secondary | ICD-10-CM | POA: Diagnosis not present

## 2023-05-26 DIAGNOSIS — I739 Peripheral vascular disease, unspecified: Secondary | ICD-10-CM | POA: Diagnosis not present

## 2023-05-26 DIAGNOSIS — I2510 Atherosclerotic heart disease of native coronary artery without angina pectoris: Secondary | ICD-10-CM | POA: Diagnosis not present

## 2023-05-26 DIAGNOSIS — E1122 Type 2 diabetes mellitus with diabetic chronic kidney disease: Secondary | ICD-10-CM | POA: Diagnosis not present

## 2023-05-26 DIAGNOSIS — Z720 Tobacco use: Secondary | ICD-10-CM | POA: Diagnosis not present

## 2023-05-27 DIAGNOSIS — E1159 Type 2 diabetes mellitus with other circulatory complications: Secondary | ICD-10-CM | POA: Diagnosis not present

## 2023-05-27 DIAGNOSIS — Z794 Long term (current) use of insulin: Secondary | ICD-10-CM | POA: Diagnosis not present

## 2023-05-27 DIAGNOSIS — Z720 Tobacco use: Secondary | ICD-10-CM | POA: Diagnosis not present

## 2023-05-27 DIAGNOSIS — I739 Peripheral vascular disease, unspecified: Secondary | ICD-10-CM | POA: Diagnosis not present

## 2023-05-27 DIAGNOSIS — N184 Chronic kidney disease, stage 4 (severe): Secondary | ICD-10-CM | POA: Diagnosis not present

## 2023-05-27 DIAGNOSIS — Z8673 Personal history of transient ischemic attack (TIA), and cerebral infarction without residual deficits: Secondary | ICD-10-CM | POA: Diagnosis not present

## 2023-05-27 DIAGNOSIS — Z955 Presence of coronary angioplasty implant and graft: Secondary | ICD-10-CM | POA: Diagnosis not present

## 2023-05-27 DIAGNOSIS — I5033 Acute on chronic diastolic (congestive) heart failure: Secondary | ICD-10-CM | POA: Diagnosis not present

## 2023-05-27 DIAGNOSIS — E1122 Type 2 diabetes mellitus with diabetic chronic kidney disease: Secondary | ICD-10-CM | POA: Diagnosis not present

## 2023-05-28 DIAGNOSIS — E1151 Type 2 diabetes mellitus with diabetic peripheral angiopathy without gangrene: Secondary | ICD-10-CM | POA: Diagnosis not present

## 2023-05-28 DIAGNOSIS — S90821D Blister (nonthermal), right foot, subsequent encounter: Secondary | ICD-10-CM | POA: Diagnosis not present

## 2023-05-28 DIAGNOSIS — E1122 Type 2 diabetes mellitus with diabetic chronic kidney disease: Secondary | ICD-10-CM | POA: Diagnosis not present

## 2023-05-28 DIAGNOSIS — D631 Anemia in chronic kidney disease: Secondary | ICD-10-CM | POA: Diagnosis not present

## 2023-05-28 DIAGNOSIS — I5033 Acute on chronic diastolic (congestive) heart failure: Secondary | ICD-10-CM | POA: Diagnosis not present

## 2023-05-28 DIAGNOSIS — S80821D Blister (nonthermal), right lower leg, subsequent encounter: Secondary | ICD-10-CM | POA: Diagnosis not present

## 2023-05-28 DIAGNOSIS — I152 Hypertension secondary to endocrine disorders: Secondary | ICD-10-CM | POA: Diagnosis not present

## 2023-05-28 DIAGNOSIS — N184 Chronic kidney disease, stage 4 (severe): Secondary | ICD-10-CM | POA: Diagnosis not present

## 2023-05-28 DIAGNOSIS — E1159 Type 2 diabetes mellitus with other circulatory complications: Secondary | ICD-10-CM | POA: Diagnosis not present

## 2023-05-30 DIAGNOSIS — I152 Hypertension secondary to endocrine disorders: Secondary | ICD-10-CM | POA: Diagnosis not present

## 2023-05-30 DIAGNOSIS — E1122 Type 2 diabetes mellitus with diabetic chronic kidney disease: Secondary | ICD-10-CM | POA: Diagnosis not present

## 2023-05-30 DIAGNOSIS — D631 Anemia in chronic kidney disease: Secondary | ICD-10-CM | POA: Diagnosis not present

## 2023-05-30 DIAGNOSIS — S90821D Blister (nonthermal), right foot, subsequent encounter: Secondary | ICD-10-CM | POA: Diagnosis not present

## 2023-05-30 DIAGNOSIS — E1151 Type 2 diabetes mellitus with diabetic peripheral angiopathy without gangrene: Secondary | ICD-10-CM | POA: Diagnosis not present

## 2023-05-30 DIAGNOSIS — I5033 Acute on chronic diastolic (congestive) heart failure: Secondary | ICD-10-CM | POA: Diagnosis not present

## 2023-05-30 DIAGNOSIS — E1159 Type 2 diabetes mellitus with other circulatory complications: Secondary | ICD-10-CM | POA: Diagnosis not present

## 2023-05-30 DIAGNOSIS — N184 Chronic kidney disease, stage 4 (severe): Secondary | ICD-10-CM | POA: Diagnosis not present

## 2023-05-30 DIAGNOSIS — S80821D Blister (nonthermal), right lower leg, subsequent encounter: Secondary | ICD-10-CM | POA: Diagnosis not present

## 2023-05-31 ENCOUNTER — Encounter: Payer: Self-pay | Admitting: Medical-Surgical

## 2023-05-31 ENCOUNTER — Ambulatory Visit (INDEPENDENT_AMBULATORY_CARE_PROVIDER_SITE_OTHER): Payer: Medicare HMO | Admitting: Medical-Surgical

## 2023-05-31 ENCOUNTER — Ambulatory Visit: Payer: Medicare HMO

## 2023-05-31 VITALS — BP 95/50 | HR 89 | Resp 20 | Ht 69.0 in | Wt 151.1 lb

## 2023-05-31 DIAGNOSIS — I509 Heart failure, unspecified: Secondary | ICD-10-CM

## 2023-05-31 DIAGNOSIS — J432 Centrilobular emphysema: Secondary | ICD-10-CM | POA: Diagnosis not present

## 2023-05-31 DIAGNOSIS — N184 Chronic kidney disease, stage 4 (severe): Secondary | ICD-10-CM

## 2023-05-31 DIAGNOSIS — Z09 Encounter for follow-up examination after completed treatment for conditions other than malignant neoplasm: Secondary | ICD-10-CM

## 2023-05-31 DIAGNOSIS — D649 Anemia, unspecified: Secondary | ICD-10-CM | POA: Diagnosis not present

## 2023-05-31 DIAGNOSIS — F1190 Opioid use, unspecified, uncomplicated: Secondary | ICD-10-CM

## 2023-05-31 DIAGNOSIS — R0989 Other specified symptoms and signs involving the circulatory and respiratory systems: Secondary | ICD-10-CM

## 2023-05-31 DIAGNOSIS — I5032 Chronic diastolic (congestive) heart failure: Secondary | ICD-10-CM | POA: Diagnosis not present

## 2023-05-31 MED ORDER — OXYCODONE HCL 10 MG PO TABS: 5.0000 mg | ORAL_TABLET | Freq: Four times a day (QID) | ORAL | 0 refills | Status: DC | PRN

## 2023-05-31 NOTE — Progress Notes (Signed)
        Established patient visit  History, exam, impression, and plan:  1. Hospital discharge follow-up Very pleasant 62 year old male presenting today for hospital discharge follow-up.  He has been hospitalized 3 times in the last month.  He was originally seen at the [facility] emergency room where he was admitted for weakness.  He was admitted on 05/08/2023 and discharged on 05/13/2023.  He was seen in our office for a hospital discharge follow-up on 05/18/2023 where he had severe lower extremity edema and continue to be as.  The next day, he was admitted and found to be in acute on chronic diastolic heart failure.  He was hospitalized from 05/19/2023 through 05/24/2023 where he was discharged with his on home management of congestive heart failure.  On 10/17, he presented back to the ED with continued complaints of dyspnea and lower extremity edema.  He was again admitted and kept for 2 days before being discharged on 10/19.  During his hospitalization, he received aggressive diuresis and close monitoring.  On his most recent discharge, his medication list was changed to discontinue furosemide and start torsemide 20 mg daily.  Today he reports that he has been doing well on his medications.  He is not weighing himself at home as he does not have a scale.  Continues to have lower extremity pitting edema.  Has compression socks at home but has not been wearing them.  Following a low-sodium diet and being aware of sodium content in foods.  Has appointments with cardiology, pulmonology, nephrology, and hematology upcoming.  Plan to recheck CBC and CMP today.  With the continued swelling, rechecking BNP.  On exam, rhonchi and Rales noted to all lung fields with scattered wheezing.  Getting a chest x-ray today for follow-up and further evaluation.  2. Chronic, continuous use of opioids He has been treated chronically with oxycodone which is managed here in our office.  He is aware of the recommendation for  referral to pain management should his pain management needs changed.  He would like to maintain his prescription here with 30 tablets to last 90 days.  He will be due for refill of this in the next couple of weeks so sending today.  3. Centrilobular emphysema (HCC) Emphysema noted on imaging.  He is a smoker and has been resistant to smoking cessation.  As noted above, he has an appointment with pulmonology upcoming.  4. Anemia, unspecified type Rechecking CBC today.  Referral to hematology has been placed and he has an appointment scheduled.  5. CKD (chronic kidney disease) stage 4, GFR 15-29 ml/min (HCC) Established with nephrology.  Rechecking kidney function today.  Procedures performed this visit: None.  Return in about 3 months (around 08/31/2023) for DM/HTN/HLD follow up.  __________________________________ Thayer Ohm, DNP, APRN, FNP-BC Primary Care and Sports Medicine [facility]

## 2023-06-01 ENCOUNTER — Other Ambulatory Visit: Payer: Self-pay

## 2023-06-01 DIAGNOSIS — E1151 Type 2 diabetes mellitus with diabetic peripheral angiopathy without gangrene: Secondary | ICD-10-CM | POA: Diagnosis not present

## 2023-06-01 DIAGNOSIS — I152 Hypertension secondary to endocrine disorders: Secondary | ICD-10-CM | POA: Diagnosis not present

## 2023-06-01 DIAGNOSIS — S80821D Blister (nonthermal), right lower leg, subsequent encounter: Secondary | ICD-10-CM | POA: Diagnosis not present

## 2023-06-01 DIAGNOSIS — E1159 Type 2 diabetes mellitus with other circulatory complications: Secondary | ICD-10-CM | POA: Diagnosis not present

## 2023-06-01 DIAGNOSIS — E1122 Type 2 diabetes mellitus with diabetic chronic kidney disease: Secondary | ICD-10-CM | POA: Diagnosis not present

## 2023-06-01 DIAGNOSIS — D631 Anemia in chronic kidney disease: Secondary | ICD-10-CM | POA: Diagnosis not present

## 2023-06-01 DIAGNOSIS — I5033 Acute on chronic diastolic (congestive) heart failure: Secondary | ICD-10-CM | POA: Diagnosis not present

## 2023-06-01 DIAGNOSIS — S90821D Blister (nonthermal), right foot, subsequent encounter: Secondary | ICD-10-CM | POA: Diagnosis not present

## 2023-06-01 DIAGNOSIS — N184 Chronic kidney disease, stage 4 (severe): Secondary | ICD-10-CM | POA: Diagnosis not present

## 2023-06-01 LAB — CMP14+EGFR
ALT: 24 [IU]/L (ref 0–44)
AST: 23 [IU]/L (ref 0–40)
Albumin: 2.8 g/dL — ABNORMAL LOW (ref 3.9–4.9)
Alkaline Phosphatase: 306 [IU]/L — ABNORMAL HIGH (ref 44–121)
BUN/Creatinine Ratio: 20 (ref 10–24)
BUN: 41 mg/dL — ABNORMAL HIGH (ref 8–27)
Bilirubin Total: 0.3 mg/dL (ref 0.0–1.2)
CO2: 22 mmol/L (ref 20–29)
Calcium: 8.7 mg/dL (ref 8.6–10.2)
Chloride: 104 mmol/L (ref 96–106)
Creatinine, Ser: 2.02 mg/dL — ABNORMAL HIGH (ref 0.76–1.27)
Globulin, Total: 3.4 g/dL (ref 1.5–4.5)
Glucose: 190 mg/dL — ABNORMAL HIGH (ref 70–99)
Potassium: 5.4 mmol/L — ABNORMAL HIGH (ref 3.5–5.2)
Sodium: 139 mmol/L (ref 134–144)
Total Protein: 6.2 g/dL (ref 6.0–8.5)
eGFR: 36 mL/min/{1.73_m2} — ABNORMAL LOW (ref >59)

## 2023-06-01 LAB — CBC
Hematocrit: 26 % — ABNORMAL LOW (ref 37.5–51.0)
Hemoglobin: 8.3 g/dL — ABNORMAL LOW (ref 13.0–17.7)
MCH: 31.2 pg (ref 26.6–33.0)
MCHC: 31.9 g/dL (ref 31.5–35.7)
MCV: 98 fL — ABNORMAL HIGH (ref 79–97)
Platelets: 436 10*3/uL (ref 150–450)
RBC: 2.66 x10E6/uL — CL (ref 4.14–5.80)
RDW: 13.4 % (ref 11.6–15.4)
WBC: 10.8 10*3/uL (ref 3.4–10.8)

## 2023-06-01 LAB — BRAIN NATRIURETIC PEPTIDE: BNP: 453.1 pg/mL — ABNORMAL HIGH (ref 0.0–100.0)

## 2023-06-01 NOTE — Progress Notes (Signed)
06/01/2023  Patient ID: Joseph Grimes, male   DOB: [birth year], 62 y.o.   MRN: [id number]  Pck received notification from [employer] that Mr. Skluzacek was conditionally approved for program renewal for 2025 but that the patient would need to complete his consent on their website.  Patient outreach to see if patient needed any assistance with completing this.  I was not able to reach him, but I  was able to leave HIPAA compliant voicemail with my direct phone number; so he can return my call at his convenience.  Lenna Gilford, PharmD, DPLA

## 2023-06-01 NOTE — Transitions of Care (Post Inpatient/ED Visit) (Signed)
06/01/2023  Name: Joseph Grimes MRN: [id number] DOB: [birth year]  Today's TOC FU Call Status: Today's TOC FU Call Status:: Unsuccessful Call (1st Attempt) Unsuccessful Call (1st Attempt) Date: 06/01/23  Attempted to reach the patient regarding the most recent Inpatient/ED visit.  Follow Up Plan: Additional outreach attempts will be made to reach the patient to complete the Transitions of Care (Post Inpatient/ED visit) call.    Gabriel Cirri MSN, RN [place]  [facility] Management Coordinator [email or web address] Direct Dial: [phone] Fax: [phone]

## 2023-06-02 DIAGNOSIS — S90821D Blister (nonthermal), right foot, subsequent encounter: Secondary | ICD-10-CM | POA: Diagnosis not present

## 2023-06-02 DIAGNOSIS — D539 Nutritional anemia, unspecified: Secondary | ICD-10-CM | POA: Diagnosis not present

## 2023-06-02 DIAGNOSIS — E1159 Type 2 diabetes mellitus with other circulatory complications: Secondary | ICD-10-CM | POA: Diagnosis not present

## 2023-06-02 DIAGNOSIS — I509 Heart failure, unspecified: Secondary | ICD-10-CM | POA: Diagnosis not present

## 2023-06-02 DIAGNOSIS — I5033 Acute on chronic diastolic (congestive) heart failure: Secondary | ICD-10-CM | POA: Diagnosis not present

## 2023-06-02 DIAGNOSIS — N184 Chronic kidney disease, stage 4 (severe): Secondary | ICD-10-CM | POA: Diagnosis not present

## 2023-06-02 DIAGNOSIS — R634 Abnormal weight loss: Secondary | ICD-10-CM | POA: Diagnosis not present

## 2023-06-02 DIAGNOSIS — R5382 Chronic fatigue, unspecified: Secondary | ICD-10-CM | POA: Diagnosis not present

## 2023-06-02 DIAGNOSIS — D649 Anemia, unspecified: Secondary | ICD-10-CM | POA: Diagnosis not present

## 2023-06-02 DIAGNOSIS — E1122 Type 2 diabetes mellitus with diabetic chronic kidney disease: Secondary | ICD-10-CM | POA: Diagnosis not present

## 2023-06-02 DIAGNOSIS — I152 Hypertension secondary to endocrine disorders: Secondary | ICD-10-CM | POA: Diagnosis not present

## 2023-06-02 DIAGNOSIS — D631 Anemia in chronic kidney disease: Secondary | ICD-10-CM | POA: Diagnosis not present

## 2023-06-02 DIAGNOSIS — D72821 Monocytosis (symptomatic): Secondary | ICD-10-CM | POA: Diagnosis not present

## 2023-06-02 DIAGNOSIS — E1151 Type 2 diabetes mellitus with diabetic peripheral angiopathy without gangrene: Secondary | ICD-10-CM | POA: Diagnosis not present

## 2023-06-02 DIAGNOSIS — S80821D Blister (nonthermal), right lower leg, subsequent encounter: Secondary | ICD-10-CM | POA: Diagnosis not present

## 2023-06-05 ENCOUNTER — Ambulatory Visit: Payer: Self-pay | Admitting: *Deleted

## 2023-06-05 DIAGNOSIS — I152 Hypertension secondary to endocrine disorders: Secondary | ICD-10-CM | POA: Diagnosis not present

## 2023-06-05 DIAGNOSIS — D649 Anemia, unspecified: Secondary | ICD-10-CM | POA: Diagnosis not present

## 2023-06-05 DIAGNOSIS — N184 Chronic kidney disease, stage 4 (severe): Secondary | ICD-10-CM | POA: Diagnosis not present

## 2023-06-05 DIAGNOSIS — N1832 Chronic kidney disease, stage 3b: Secondary | ICD-10-CM | POA: Diagnosis not present

## 2023-06-05 DIAGNOSIS — J449 Chronic obstructive pulmonary disease, unspecified: Secondary | ICD-10-CM | POA: Diagnosis not present

## 2023-06-05 DIAGNOSIS — E1151 Type 2 diabetes mellitus with diabetic peripheral angiopathy without gangrene: Secondary | ICD-10-CM | POA: Diagnosis not present

## 2023-06-05 DIAGNOSIS — E1122 Type 2 diabetes mellitus with diabetic chronic kidney disease: Secondary | ICD-10-CM | POA: Diagnosis not present

## 2023-06-05 DIAGNOSIS — D631 Anemia in chronic kidney disease: Secondary | ICD-10-CM | POA: Diagnosis not present

## 2023-06-05 DIAGNOSIS — Z720 Tobacco use: Secondary | ICD-10-CM | POA: Diagnosis not present

## 2023-06-05 DIAGNOSIS — E1159 Type 2 diabetes mellitus with other circulatory complications: Secondary | ICD-10-CM | POA: Diagnosis not present

## 2023-06-05 DIAGNOSIS — I5032 Chronic diastolic (congestive) heart failure: Secondary | ICD-10-CM | POA: Diagnosis not present

## 2023-06-05 DIAGNOSIS — I739 Peripheral vascular disease, unspecified: Secondary | ICD-10-CM | POA: Diagnosis not present

## 2023-06-05 DIAGNOSIS — Z955 Presence of coronary angioplasty implant and graft: Secondary | ICD-10-CM | POA: Diagnosis not present

## 2023-06-05 DIAGNOSIS — I25118 Atherosclerotic heart disease of native coronary artery with other forms of angina pectoris: Secondary | ICD-10-CM | POA: Diagnosis not present

## 2023-06-05 DIAGNOSIS — S80821D Blister (nonthermal), right lower leg, subsequent encounter: Secondary | ICD-10-CM | POA: Diagnosis not present

## 2023-06-05 DIAGNOSIS — S90821D Blister (nonthermal), right foot, subsequent encounter: Secondary | ICD-10-CM | POA: Diagnosis not present

## 2023-06-05 DIAGNOSIS — I5033 Acute on chronic diastolic (congestive) heart failure: Secondary | ICD-10-CM | POA: Diagnosis not present

## 2023-06-05 NOTE — Patient Outreach (Signed)
Care Management  Transitions of Care Program Transitions of Care Post-discharge week # 1-- patient has experienced multiple recent hospital admissions  06/05/2023 Name: Joseph Grimes MRN: [id number] DOB: September 09, 1960  Subjective: Joseph Grimes is a 62 y.o. year old male who is a primary care patient of [name], NP. The Care Management team was unable to reach the patient by phone to assess and address transitions of care needs; left HIPAA compliant voice message requesting call back  Hospital admissions 1) 05/08/23- 05/13/23: Originally enrolled into 30-day TOC program on 05/18/23 2) 05/19/23- 05/24/23: Re-enrolled into 30-day TOC program on 05/24/23 3) 05/25/23- 05/27/23: Unsuccessful attempts placed on 06/01/23 and 06/05/23 to resume 30-day TOC program  Plan: Additional outreach attempts will be made to reach the patient enrolled in the [facility] Program (Post Inpatient/ED Visit).  Caryl Pina, RN, BSN, Media planner  Transitions of Care  [facility] - [facility] [phone]: direct office

## 2023-06-06 ENCOUNTER — Telehealth: Payer: Self-pay | Admitting: *Deleted

## 2023-06-06 DIAGNOSIS — E1151 Type 2 diabetes mellitus with diabetic peripheral angiopathy without gangrene: Secondary | ICD-10-CM | POA: Diagnosis not present

## 2023-06-06 DIAGNOSIS — N184 Chronic kidney disease, stage 4 (severe): Secondary | ICD-10-CM | POA: Diagnosis not present

## 2023-06-06 DIAGNOSIS — D631 Anemia in chronic kidney disease: Secondary | ICD-10-CM | POA: Diagnosis not present

## 2023-06-06 DIAGNOSIS — S90821D Blister (nonthermal), right foot, subsequent encounter: Secondary | ICD-10-CM | POA: Diagnosis not present

## 2023-06-06 DIAGNOSIS — E1159 Type 2 diabetes mellitus with other circulatory complications: Secondary | ICD-10-CM | POA: Diagnosis not present

## 2023-06-06 DIAGNOSIS — I152 Hypertension secondary to endocrine disorders: Secondary | ICD-10-CM | POA: Diagnosis not present

## 2023-06-06 DIAGNOSIS — S80821D Blister (nonthermal), right lower leg, subsequent encounter: Secondary | ICD-10-CM | POA: Diagnosis not present

## 2023-06-06 DIAGNOSIS — E1122 Type 2 diabetes mellitus with diabetic chronic kidney disease: Secondary | ICD-10-CM | POA: Diagnosis not present

## 2023-06-06 DIAGNOSIS — I5033 Acute on chronic diastolic (congestive) heart failure: Secondary | ICD-10-CM | POA: Diagnosis not present

## 2023-06-06 NOTE — Transitions of Care (Post Inpatient/ED Visit) (Signed)
06/06/2023  Name: EMBRY BALDERRAMA MRN: [id number] DOB: 06-May-1961  Today's TOC FU Call Status: Today's TOC FU Call Status:: Unsuccessful Call (3rd Attempt) Unsuccessful Call (3rd Attempt) Date: 06/06/23  Attempted to reach the patient regarding the most recent Inpatient visit; Phone rang multiple times without physical or voice mail pick up-- unable to leave voice message requesting call back   Follow Up Plan: No further outreach attempts will be made at this time. We have been unable to contact the patient.  Caryl Pina, RN, BSN, Media planner  Transitions of Care  [facility]BC[facility] - [facility] [phone]: direct office

## 2023-06-08 ENCOUNTER — Encounter: Payer: Medicare HMO | Admitting: *Deleted

## 2023-06-08 DIAGNOSIS — I5033 Acute on chronic diastolic (congestive) heart failure: Secondary | ICD-10-CM | POA: Diagnosis not present

## 2023-06-08 DIAGNOSIS — S80821D Blister (nonthermal), right lower leg, subsequent encounter: Secondary | ICD-10-CM | POA: Diagnosis not present

## 2023-06-08 DIAGNOSIS — S90821D Blister (nonthermal), right foot, subsequent encounter: Secondary | ICD-10-CM | POA: Diagnosis not present

## 2023-06-08 DIAGNOSIS — E1151 Type 2 diabetes mellitus with diabetic peripheral angiopathy without gangrene: Secondary | ICD-10-CM | POA: Diagnosis not present

## 2023-06-08 DIAGNOSIS — E1122 Type 2 diabetes mellitus with diabetic chronic kidney disease: Secondary | ICD-10-CM | POA: Diagnosis not present

## 2023-06-08 DIAGNOSIS — E1159 Type 2 diabetes mellitus with other circulatory complications: Secondary | ICD-10-CM | POA: Diagnosis not present

## 2023-06-08 DIAGNOSIS — D631 Anemia in chronic kidney disease: Secondary | ICD-10-CM | POA: Diagnosis not present

## 2023-06-08 DIAGNOSIS — I152 Hypertension secondary to endocrine disorders: Secondary | ICD-10-CM | POA: Diagnosis not present

## 2023-06-08 DIAGNOSIS — N184 Chronic kidney disease, stage 4 (severe): Secondary | ICD-10-CM | POA: Diagnosis not present

## 2023-06-09 ENCOUNTER — Other Ambulatory Visit: Payer: Self-pay | Admitting: Medical-Surgical

## 2023-06-12 DIAGNOSIS — D631 Anemia in chronic kidney disease: Secondary | ICD-10-CM | POA: Diagnosis not present

## 2023-06-12 DIAGNOSIS — J439 Emphysema, unspecified: Secondary | ICD-10-CM | POA: Diagnosis not present

## 2023-06-12 DIAGNOSIS — N184 Chronic kidney disease, stage 4 (severe): Secondary | ICD-10-CM | POA: Diagnosis not present

## 2023-06-12 DIAGNOSIS — S90821D Blister (nonthermal), right foot, subsequent encounter: Secondary | ICD-10-CM | POA: Diagnosis not present

## 2023-06-12 DIAGNOSIS — E1159 Type 2 diabetes mellitus with other circulatory complications: Secondary | ICD-10-CM | POA: Diagnosis not present

## 2023-06-12 DIAGNOSIS — I152 Hypertension secondary to endocrine disorders: Secondary | ICD-10-CM | POA: Diagnosis not present

## 2023-06-12 DIAGNOSIS — R0600 Dyspnea, unspecified: Secondary | ICD-10-CM | POA: Diagnosis not present

## 2023-06-12 DIAGNOSIS — S80821D Blister (nonthermal), right lower leg, subsequent encounter: Secondary | ICD-10-CM | POA: Diagnosis not present

## 2023-06-12 DIAGNOSIS — E1151 Type 2 diabetes mellitus with diabetic peripheral angiopathy without gangrene: Secondary | ICD-10-CM | POA: Diagnosis not present

## 2023-06-12 DIAGNOSIS — E1122 Type 2 diabetes mellitus with diabetic chronic kidney disease: Secondary | ICD-10-CM | POA: Diagnosis not present

## 2023-06-12 DIAGNOSIS — I5033 Acute on chronic diastolic (congestive) heart failure: Secondary | ICD-10-CM | POA: Diagnosis not present

## 2023-06-15 ENCOUNTER — Telehealth: Payer: Self-pay

## 2023-06-15 DIAGNOSIS — S91302A Unspecified open wound, left foot, initial encounter: Secondary | ICD-10-CM | POA: Diagnosis not present

## 2023-06-15 DIAGNOSIS — E1151 Type 2 diabetes mellitus with diabetic peripheral angiopathy without gangrene: Secondary | ICD-10-CM | POA: Diagnosis not present

## 2023-06-15 DIAGNOSIS — L089 Local infection of the skin and subcutaneous tissue, unspecified: Secondary | ICD-10-CM | POA: Diagnosis not present

## 2023-06-15 DIAGNOSIS — I252 Old myocardial infarction: Secondary | ICD-10-CM | POA: Diagnosis not present

## 2023-06-15 DIAGNOSIS — D631 Anemia in chronic kidney disease: Secondary | ICD-10-CM | POA: Diagnosis not present

## 2023-06-15 DIAGNOSIS — I2510 Atherosclerotic heart disease of native coronary artery without angina pectoris: Secondary | ICD-10-CM | POA: Diagnosis not present

## 2023-06-15 DIAGNOSIS — N184 Chronic kidney disease, stage 4 (severe): Secondary | ICD-10-CM | POA: Diagnosis not present

## 2023-06-15 DIAGNOSIS — E1159 Type 2 diabetes mellitus with other circulatory complications: Secondary | ICD-10-CM | POA: Diagnosis not present

## 2023-06-15 DIAGNOSIS — S90821D Blister (nonthermal), right foot, subsequent encounter: Secondary | ICD-10-CM | POA: Diagnosis not present

## 2023-06-15 DIAGNOSIS — E08621 Diabetes mellitus due to underlying condition with foot ulcer: Secondary | ICD-10-CM | POA: Diagnosis not present

## 2023-06-15 DIAGNOSIS — E1152 Type 2 diabetes mellitus with diabetic peripheral angiopathy with gangrene: Secondary | ICD-10-CM | POA: Diagnosis not present

## 2023-06-15 DIAGNOSIS — E1140 Type 2 diabetes mellitus with diabetic neuropathy, unspecified: Secondary | ICD-10-CM | POA: Diagnosis not present

## 2023-06-15 DIAGNOSIS — S80821D Blister (nonthermal), right lower leg, subsequent encounter: Secondary | ICD-10-CM | POA: Diagnosis not present

## 2023-06-15 DIAGNOSIS — I132 Hypertensive heart and chronic kidney disease with heart failure and with stage 5 chronic kidney disease, or end stage renal disease: Secondary | ICD-10-CM | POA: Diagnosis not present

## 2023-06-15 DIAGNOSIS — I5033 Acute on chronic diastolic (congestive) heart failure: Secondary | ICD-10-CM | POA: Diagnosis not present

## 2023-06-15 DIAGNOSIS — E1122 Type 2 diabetes mellitus with diabetic chronic kidney disease: Secondary | ICD-10-CM | POA: Diagnosis not present

## 2023-06-15 DIAGNOSIS — I96 Gangrene, not elsewhere classified: Secondary | ICD-10-CM | POA: Diagnosis not present

## 2023-06-15 DIAGNOSIS — L97429 Non-pressure chronic ulcer of left heel and midfoot with unspecified severity: Secondary | ICD-10-CM | POA: Diagnosis not present

## 2023-06-15 DIAGNOSIS — E11621 Type 2 diabetes mellitus with foot ulcer: Secondary | ICD-10-CM | POA: Diagnosis not present

## 2023-06-15 DIAGNOSIS — E11628 Type 2 diabetes mellitus with other skin complications: Secondary | ICD-10-CM | POA: Diagnosis not present

## 2023-06-15 DIAGNOSIS — I152 Hypertension secondary to endocrine disorders: Secondary | ICD-10-CM | POA: Diagnosis not present

## 2023-06-15 NOTE — Telephone Encounter (Signed)
Received a call from RN [place] from [place]. Per RN, she was at the patient's home for wound care. The nurse stated that a foul, strong odor was noted when patient's sock was removed. Upon checking the open wound on the left heel, she noted that the skin around the wound was black and the rest of his foot looked pale.   During the call, RN [place] requested if a wound center referral is optimal to continue care for the patient. The request was denied.   [place] was informed that patient will need to go to the ER asap to be evaluated. She agree with the plan of care and was going to have patient transported to the ER.

## 2023-06-16 ENCOUNTER — Ambulatory Visit: Payer: Medicare HMO | Admitting: Medical-Surgical

## 2023-06-16 DIAGNOSIS — E11628 Type 2 diabetes mellitus with other skin complications: Secondary | ICD-10-CM | POA: Diagnosis not present

## 2023-06-16 DIAGNOSIS — I5042 Chronic combined systolic (congestive) and diastolic (congestive) heart failure: Secondary | ICD-10-CM | POA: Diagnosis not present

## 2023-06-16 DIAGNOSIS — D631 Anemia in chronic kidney disease: Secondary | ICD-10-CM | POA: Diagnosis not present

## 2023-06-16 DIAGNOSIS — G929 Unspecified toxic encephalopathy: Secondary | ICD-10-CM | POA: Diagnosis not present

## 2023-06-16 DIAGNOSIS — M86172 Other acute osteomyelitis, left ankle and foot: Secondary | ICD-10-CM | POA: Diagnosis not present

## 2023-06-16 DIAGNOSIS — Z1612 Extended spectrum beta lactamase (ESBL) resistance: Secondary | ICD-10-CM | POA: Diagnosis not present

## 2023-06-16 DIAGNOSIS — R600 Localized edema: Secondary | ICD-10-CM | POA: Diagnosis not present

## 2023-06-16 DIAGNOSIS — I959 Hypotension, unspecified: Secondary | ICD-10-CM | POA: Diagnosis not present

## 2023-06-16 DIAGNOSIS — I2510 Atherosclerotic heart disease of native coronary artery without angina pectoris: Secondary | ICD-10-CM | POA: Diagnosis not present

## 2023-06-16 DIAGNOSIS — R918 Other nonspecific abnormal finding of lung field: Secondary | ICD-10-CM | POA: Diagnosis not present

## 2023-06-16 DIAGNOSIS — I5032 Chronic diastolic (congestive) heart failure: Secondary | ICD-10-CM | POA: Diagnosis not present

## 2023-06-16 DIAGNOSIS — I130 Hypertensive heart and chronic kidney disease with heart failure and stage 1 through stage 4 chronic kidney disease, or unspecified chronic kidney disease: Secondary | ICD-10-CM | POA: Diagnosis not present

## 2023-06-16 DIAGNOSIS — Z4781 Encounter for orthopedic aftercare following surgical amputation: Secondary | ICD-10-CM | POA: Diagnosis not present

## 2023-06-16 DIAGNOSIS — N1832 Chronic kidney disease, stage 3b: Secondary | ICD-10-CM | POA: Diagnosis not present

## 2023-06-16 DIAGNOSIS — E7800 Pure hypercholesterolemia, unspecified: Secondary | ICD-10-CM | POA: Insufficient documentation

## 2023-06-16 DIAGNOSIS — R0902 Hypoxemia: Secondary | ICD-10-CM | POA: Diagnosis not present

## 2023-06-16 DIAGNOSIS — I5020 Unspecified systolic (congestive) heart failure: Secondary | ICD-10-CM | POA: Insufficient documentation

## 2023-06-16 DIAGNOSIS — I509 Heart failure, unspecified: Secondary | ICD-10-CM | POA: Diagnosis not present

## 2023-06-16 DIAGNOSIS — D72829 Elevated white blood cell count, unspecified: Secondary | ICD-10-CM | POA: Diagnosis not present

## 2023-06-16 DIAGNOSIS — I96 Gangrene, not elsewhere classified: Secondary | ICD-10-CM | POA: Diagnosis not present

## 2023-06-16 DIAGNOSIS — Z720 Tobacco use: Secondary | ICD-10-CM | POA: Diagnosis not present

## 2023-06-16 DIAGNOSIS — E1122 Type 2 diabetes mellitus with diabetic chronic kidney disease: Secondary | ICD-10-CM | POA: Insufficient documentation

## 2023-06-16 DIAGNOSIS — Z794 Long term (current) use of insulin: Secondary | ICD-10-CM | POA: Diagnosis not present

## 2023-06-16 DIAGNOSIS — N184 Chronic kidney disease, stage 4 (severe): Secondary | ICD-10-CM | POA: Diagnosis not present

## 2023-06-16 DIAGNOSIS — Z9981 Dependence on supplemental oxygen: Secondary | ICD-10-CM | POA: Diagnosis not present

## 2023-06-16 DIAGNOSIS — I252 Old myocardial infarction: Secondary | ICD-10-CM | POA: Diagnosis not present

## 2023-06-16 DIAGNOSIS — R509 Fever, unspecified: Secondary | ICD-10-CM | POA: Diagnosis not present

## 2023-06-16 DIAGNOSIS — Z89512 Acquired absence of left leg below knee: Secondary | ICD-10-CM | POA: Diagnosis not present

## 2023-06-16 DIAGNOSIS — L97429 Non-pressure chronic ulcer of left heel and midfoot with unspecified severity: Secondary | ICD-10-CM | POA: Diagnosis not present

## 2023-06-16 DIAGNOSIS — D62 Acute posthemorrhagic anemia: Secondary | ICD-10-CM | POA: Diagnosis not present

## 2023-06-16 DIAGNOSIS — I739 Peripheral vascular disease, unspecified: Secondary | ICD-10-CM | POA: Diagnosis not present

## 2023-06-16 DIAGNOSIS — R7881 Bacteremia: Secondary | ICD-10-CM | POA: Diagnosis not present

## 2023-06-16 DIAGNOSIS — Z89511 Acquired absence of right leg below knee: Secondary | ICD-10-CM | POA: Diagnosis not present

## 2023-06-16 DIAGNOSIS — R4182 Altered mental status, unspecified: Secondary | ICD-10-CM | POA: Diagnosis not present

## 2023-06-16 DIAGNOSIS — R569 Unspecified convulsions: Secondary | ICD-10-CM | POA: Diagnosis not present

## 2023-06-16 DIAGNOSIS — G928 Other toxic encephalopathy: Secondary | ICD-10-CM | POA: Diagnosis not present

## 2023-06-16 DIAGNOSIS — E1169 Type 2 diabetes mellitus with other specified complication: Secondary | ICD-10-CM | POA: Diagnosis not present

## 2023-06-16 DIAGNOSIS — R079 Chest pain, unspecified: Secondary | ICD-10-CM | POA: Diagnosis not present

## 2023-06-16 DIAGNOSIS — Z0189 Encounter for other specified special examinations: Secondary | ICD-10-CM | POA: Diagnosis not present

## 2023-06-16 DIAGNOSIS — L089 Local infection of the skin and subcutaneous tissue, unspecified: Secondary | ICD-10-CM | POA: Diagnosis not present

## 2023-06-16 DIAGNOSIS — R531 Weakness: Secondary | ICD-10-CM | POA: Diagnosis not present

## 2023-06-16 DIAGNOSIS — Z7401 Bed confinement status: Secondary | ICD-10-CM | POA: Diagnosis not present

## 2023-06-16 DIAGNOSIS — D649 Anemia, unspecified: Secondary | ICD-10-CM | POA: Diagnosis not present

## 2023-06-16 DIAGNOSIS — I5189 Other ill-defined heart diseases: Secondary | ICD-10-CM | POA: Diagnosis not present

## 2023-06-16 DIAGNOSIS — Z9713 Presence of artificial right leg (complete) (partial): Secondary | ICD-10-CM | POA: Diagnosis not present

## 2023-06-21 ENCOUNTER — Telehealth: Payer: Self-pay

## 2023-06-21 NOTE — Telephone Encounter (Signed)
Faxed forms to [facility]

## 2023-06-29 DIAGNOSIS — Z794 Long term (current) use of insulin: Secondary | ICD-10-CM | POA: Diagnosis not present

## 2023-06-29 DIAGNOSIS — N184 Chronic kidney disease, stage 4 (severe): Secondary | ICD-10-CM | POA: Diagnosis not present

## 2023-06-29 DIAGNOSIS — R5381 Other malaise: Secondary | ICD-10-CM | POA: Diagnosis not present

## 2023-06-29 DIAGNOSIS — Z89511 Acquired absence of right leg below knee: Secondary | ICD-10-CM | POA: Diagnosis not present

## 2023-06-29 DIAGNOSIS — L089 Local infection of the skin and subcutaneous tissue, unspecified: Secondary | ICD-10-CM | POA: Diagnosis not present

## 2023-06-29 DIAGNOSIS — M869 Osteomyelitis, unspecified: Secondary | ICD-10-CM | POA: Diagnosis not present

## 2023-06-29 DIAGNOSIS — I2510 Atherosclerotic heart disease of native coronary artery without angina pectoris: Secondary | ICD-10-CM | POA: Insufficient documentation

## 2023-06-29 DIAGNOSIS — I509 Heart failure, unspecified: Secondary | ICD-10-CM | POA: Insufficient documentation

## 2023-06-29 DIAGNOSIS — I130 Hypertensive heart and chronic kidney disease with heart failure and stage 1 through stage 4 chronic kidney disease, or unspecified chronic kidney disease: Secondary | ICD-10-CM | POA: Diagnosis not present

## 2023-06-29 DIAGNOSIS — R0902 Hypoxemia: Secondary | ICD-10-CM | POA: Diagnosis not present

## 2023-06-29 DIAGNOSIS — D631 Anemia in chronic kidney disease: Secondary | ICD-10-CM | POA: Diagnosis not present

## 2023-06-29 DIAGNOSIS — E119 Type 2 diabetes mellitus without complications: Secondary | ICD-10-CM | POA: Diagnosis not present

## 2023-06-29 DIAGNOSIS — E1122 Type 2 diabetes mellitus with diabetic chronic kidney disease: Secondary | ICD-10-CM | POA: Diagnosis not present

## 2023-06-29 DIAGNOSIS — I5032 Chronic diastolic (congestive) heart failure: Secondary | ICD-10-CM | POA: Diagnosis not present

## 2023-06-29 DIAGNOSIS — Z7401 Bed confinement status: Secondary | ICD-10-CM | POA: Diagnosis not present

## 2023-06-29 DIAGNOSIS — S88112A Complete traumatic amputation at level between knee and ankle, left lower leg, initial encounter: Secondary | ICD-10-CM | POA: Diagnosis not present

## 2023-06-29 DIAGNOSIS — I739 Peripheral vascular disease, unspecified: Secondary | ICD-10-CM | POA: Diagnosis not present

## 2023-06-29 DIAGNOSIS — I959 Hypotension, unspecified: Secondary | ICD-10-CM | POA: Diagnosis not present

## 2023-06-29 DIAGNOSIS — Z4781 Encounter for orthopedic aftercare following surgical amputation: Secondary | ICD-10-CM | POA: Diagnosis not present

## 2023-06-29 DIAGNOSIS — K219 Gastro-esophageal reflux disease without esophagitis: Secondary | ICD-10-CM | POA: Diagnosis not present

## 2023-06-29 DIAGNOSIS — E11628 Type 2 diabetes mellitus with other skin complications: Secondary | ICD-10-CM | POA: Diagnosis not present

## 2023-06-29 DIAGNOSIS — R531 Weakness: Secondary | ICD-10-CM | POA: Diagnosis not present

## 2023-06-29 DIAGNOSIS — Z9981 Dependence on supplemental oxygen: Secondary | ICD-10-CM | POA: Diagnosis not present

## 2023-06-30 DIAGNOSIS — K219 Gastro-esophageal reflux disease without esophagitis: Secondary | ICD-10-CM | POA: Diagnosis not present

## 2023-06-30 DIAGNOSIS — I509 Heart failure, unspecified: Secondary | ICD-10-CM | POA: Diagnosis not present

## 2023-06-30 DIAGNOSIS — S88112A Complete traumatic amputation at level between knee and ankle, left lower leg, initial encounter: Secondary | ICD-10-CM | POA: Diagnosis not present

## 2023-06-30 DIAGNOSIS — E119 Type 2 diabetes mellitus without complications: Secondary | ICD-10-CM | POA: Diagnosis not present

## 2023-06-30 DIAGNOSIS — I739 Peripheral vascular disease, unspecified: Secondary | ICD-10-CM | POA: Diagnosis not present

## 2023-06-30 DIAGNOSIS — R5381 Other malaise: Secondary | ICD-10-CM | POA: Diagnosis not present

## 2023-06-30 DIAGNOSIS — I2510 Atherosclerotic heart disease of native coronary artery without angina pectoris: Secondary | ICD-10-CM | POA: Diagnosis not present

## 2023-07-04 DIAGNOSIS — M869 Osteomyelitis, unspecified: Secondary | ICD-10-CM | POA: Diagnosis not present

## 2023-07-04 DIAGNOSIS — S88112A Complete traumatic amputation at level between knee and ankle, left lower leg, initial encounter: Secondary | ICD-10-CM | POA: Diagnosis not present

## 2023-07-04 DIAGNOSIS — R5381 Other malaise: Secondary | ICD-10-CM | POA: Diagnosis not present

## 2023-07-05 DIAGNOSIS — I509 Heart failure, unspecified: Secondary | ICD-10-CM | POA: Diagnosis not present

## 2023-07-05 DIAGNOSIS — K219 Gastro-esophageal reflux disease without esophagitis: Secondary | ICD-10-CM | POA: Diagnosis not present

## 2023-07-05 DIAGNOSIS — I739 Peripheral vascular disease, unspecified: Secondary | ICD-10-CM | POA: Diagnosis not present

## 2023-07-05 DIAGNOSIS — S88112A Complete traumatic amputation at level between knee and ankle, left lower leg, initial encounter: Secondary | ICD-10-CM | POA: Diagnosis not present

## 2023-07-05 DIAGNOSIS — E119 Type 2 diabetes mellitus without complications: Secondary | ICD-10-CM | POA: Diagnosis not present

## 2023-07-05 DIAGNOSIS — I2510 Atherosclerotic heart disease of native coronary artery without angina pectoris: Secondary | ICD-10-CM | POA: Diagnosis not present

## 2023-07-05 DIAGNOSIS — R5381 Other malaise: Secondary | ICD-10-CM | POA: Diagnosis not present

## 2023-07-07 DIAGNOSIS — I739 Peripheral vascular disease, unspecified: Secondary | ICD-10-CM | POA: Diagnosis not present

## 2023-07-07 DIAGNOSIS — I2510 Atherosclerotic heart disease of native coronary artery without angina pectoris: Secondary | ICD-10-CM | POA: Diagnosis not present

## 2023-07-07 DIAGNOSIS — S88112A Complete traumatic amputation at level between knee and ankle, left lower leg, initial encounter: Secondary | ICD-10-CM | POA: Diagnosis not present

## 2023-07-07 DIAGNOSIS — K219 Gastro-esophageal reflux disease without esophagitis: Secondary | ICD-10-CM | POA: Diagnosis not present

## 2023-07-07 DIAGNOSIS — E119 Type 2 diabetes mellitus without complications: Secondary | ICD-10-CM | POA: Diagnosis not present

## 2023-07-07 DIAGNOSIS — R5381 Other malaise: Secondary | ICD-10-CM | POA: Diagnosis not present

## 2023-07-07 DIAGNOSIS — I509 Heart failure, unspecified: Secondary | ICD-10-CM | POA: Diagnosis not present

## 2023-07-10 DIAGNOSIS — R5381 Other malaise: Secondary | ICD-10-CM | POA: Diagnosis not present

## 2023-07-10 DIAGNOSIS — I739 Peripheral vascular disease, unspecified: Secondary | ICD-10-CM | POA: Diagnosis not present

## 2023-07-10 DIAGNOSIS — E119 Type 2 diabetes mellitus without complications: Secondary | ICD-10-CM | POA: Diagnosis not present

## 2023-07-10 DIAGNOSIS — S88112A Complete traumatic amputation at level between knee and ankle, left lower leg, initial encounter: Secondary | ICD-10-CM | POA: Diagnosis not present

## 2023-07-10 DIAGNOSIS — I2510 Atherosclerotic heart disease of native coronary artery without angina pectoris: Secondary | ICD-10-CM | POA: Diagnosis not present

## 2023-07-10 DIAGNOSIS — I509 Heart failure, unspecified: Secondary | ICD-10-CM | POA: Diagnosis not present

## 2023-07-10 DIAGNOSIS — K219 Gastro-esophageal reflux disease without esophagitis: Secondary | ICD-10-CM | POA: Diagnosis not present

## 2023-07-14 DIAGNOSIS — S88112A Complete traumatic amputation at level between knee and ankle, left lower leg, initial encounter: Secondary | ICD-10-CM | POA: Diagnosis not present

## 2023-07-14 DIAGNOSIS — I509 Heart failure, unspecified: Secondary | ICD-10-CM | POA: Diagnosis not present

## 2023-07-14 DIAGNOSIS — K219 Gastro-esophageal reflux disease without esophagitis: Secondary | ICD-10-CM | POA: Diagnosis not present

## 2023-07-14 DIAGNOSIS — E119 Type 2 diabetes mellitus without complications: Secondary | ICD-10-CM | POA: Diagnosis not present

## 2023-07-14 DIAGNOSIS — R5381 Other malaise: Secondary | ICD-10-CM | POA: Diagnosis not present

## 2023-07-14 DIAGNOSIS — I739 Peripheral vascular disease, unspecified: Secondary | ICD-10-CM | POA: Diagnosis not present

## 2023-07-14 DIAGNOSIS — I2510 Atherosclerotic heart disease of native coronary artery without angina pectoris: Secondary | ICD-10-CM | POA: Diagnosis not present

## 2023-07-19 DIAGNOSIS — E119 Type 2 diabetes mellitus without complications: Secondary | ICD-10-CM | POA: Diagnosis not present

## 2023-07-19 DIAGNOSIS — I739 Peripheral vascular disease, unspecified: Secondary | ICD-10-CM | POA: Diagnosis not present

## 2023-07-19 DIAGNOSIS — S88112A Complete traumatic amputation at level between knee and ankle, left lower leg, initial encounter: Secondary | ICD-10-CM | POA: Diagnosis not present

## 2023-07-19 DIAGNOSIS — R5381 Other malaise: Secondary | ICD-10-CM | POA: Diagnosis not present

## 2023-07-19 DIAGNOSIS — I2510 Atherosclerotic heart disease of native coronary artery without angina pectoris: Secondary | ICD-10-CM | POA: Diagnosis not present

## 2023-07-19 DIAGNOSIS — K219 Gastro-esophageal reflux disease without esophagitis: Secondary | ICD-10-CM | POA: Diagnosis not present

## 2023-07-19 DIAGNOSIS — I509 Heart failure, unspecified: Secondary | ICD-10-CM | POA: Diagnosis not present

## 2023-07-21 DIAGNOSIS — R5381 Other malaise: Secondary | ICD-10-CM | POA: Diagnosis not present

## 2023-07-21 DIAGNOSIS — I509 Heart failure, unspecified: Secondary | ICD-10-CM | POA: Diagnosis not present

## 2023-07-21 DIAGNOSIS — E119 Type 2 diabetes mellitus without complications: Secondary | ICD-10-CM | POA: Diagnosis not present

## 2023-07-21 DIAGNOSIS — I2510 Atherosclerotic heart disease of native coronary artery without angina pectoris: Secondary | ICD-10-CM | POA: Diagnosis not present

## 2023-07-21 DIAGNOSIS — S88112A Complete traumatic amputation at level between knee and ankle, left lower leg, initial encounter: Secondary | ICD-10-CM | POA: Diagnosis not present

## 2023-07-21 DIAGNOSIS — K219 Gastro-esophageal reflux disease without esophagitis: Secondary | ICD-10-CM | POA: Diagnosis not present

## 2023-07-21 DIAGNOSIS — I739 Peripheral vascular disease, unspecified: Secondary | ICD-10-CM | POA: Diagnosis not present

## 2023-07-24 DIAGNOSIS — E119 Type 2 diabetes mellitus without complications: Secondary | ICD-10-CM | POA: Diagnosis not present

## 2023-07-24 DIAGNOSIS — I739 Peripheral vascular disease, unspecified: Secondary | ICD-10-CM | POA: Diagnosis not present

## 2023-07-24 DIAGNOSIS — I509 Heart failure, unspecified: Secondary | ICD-10-CM | POA: Diagnosis not present

## 2023-07-24 DIAGNOSIS — K219 Gastro-esophageal reflux disease without esophagitis: Secondary | ICD-10-CM | POA: Diagnosis not present

## 2023-07-24 DIAGNOSIS — I2510 Atherosclerotic heart disease of native coronary artery without angina pectoris: Secondary | ICD-10-CM | POA: Diagnosis not present

## 2023-07-24 DIAGNOSIS — R5381 Other malaise: Secondary | ICD-10-CM | POA: Diagnosis not present

## 2023-07-24 DIAGNOSIS — S88112A Complete traumatic amputation at level between knee and ankle, left lower leg, initial encounter: Secondary | ICD-10-CM | POA: Diagnosis not present

## 2023-07-24 DIAGNOSIS — M6281 Muscle weakness (generalized): Secondary | ICD-10-CM | POA: Diagnosis not present

## 2023-07-26 DIAGNOSIS — S90821D Blister (nonthermal), right foot, subsequent encounter: Secondary | ICD-10-CM | POA: Diagnosis not present

## 2023-07-26 DIAGNOSIS — I152 Hypertension secondary to endocrine disorders: Secondary | ICD-10-CM | POA: Diagnosis not present

## 2023-07-26 DIAGNOSIS — D631 Anemia in chronic kidney disease: Secondary | ICD-10-CM | POA: Diagnosis not present

## 2023-07-26 DIAGNOSIS — I5033 Acute on chronic diastolic (congestive) heart failure: Secondary | ICD-10-CM | POA: Diagnosis not present

## 2023-07-26 DIAGNOSIS — S80821D Blister (nonthermal), right lower leg, subsequent encounter: Secondary | ICD-10-CM | POA: Diagnosis not present

## 2023-07-26 DIAGNOSIS — N184 Chronic kidney disease, stage 4 (severe): Secondary | ICD-10-CM | POA: Diagnosis not present

## 2023-07-26 DIAGNOSIS — E1122 Type 2 diabetes mellitus with diabetic chronic kidney disease: Secondary | ICD-10-CM | POA: Diagnosis not present

## 2023-07-26 DIAGNOSIS — E1159 Type 2 diabetes mellitus with other circulatory complications: Secondary | ICD-10-CM | POA: Diagnosis not present

## 2023-07-26 DIAGNOSIS — E1151 Type 2 diabetes mellitus with diabetic peripheral angiopathy without gangrene: Secondary | ICD-10-CM | POA: Diagnosis not present

## 2023-07-31 ENCOUNTER — Other Ambulatory Visit: Payer: Self-pay | Admitting: Medical-Surgical

## 2023-08-03 ENCOUNTER — Other Ambulatory Visit: Payer: Self-pay | Admitting: Medical-Surgical

## 2023-08-03 DIAGNOSIS — E1151 Type 2 diabetes mellitus with diabetic peripheral angiopathy without gangrene: Secondary | ICD-10-CM | POA: Diagnosis not present

## 2023-08-03 DIAGNOSIS — E1159 Type 2 diabetes mellitus with other circulatory complications: Secondary | ICD-10-CM | POA: Diagnosis not present

## 2023-08-03 DIAGNOSIS — F1190 Opioid use, unspecified, uncomplicated: Secondary | ICD-10-CM

## 2023-08-03 DIAGNOSIS — E1122 Type 2 diabetes mellitus with diabetic chronic kidney disease: Secondary | ICD-10-CM | POA: Diagnosis not present

## 2023-08-03 DIAGNOSIS — G929 Unspecified toxic encephalopathy: Secondary | ICD-10-CM | POA: Diagnosis not present

## 2023-08-03 DIAGNOSIS — Z4781 Encounter for orthopedic aftercare following surgical amputation: Secondary | ICD-10-CM | POA: Diagnosis not present

## 2023-08-03 DIAGNOSIS — I152 Hypertension secondary to endocrine disorders: Secondary | ICD-10-CM | POA: Diagnosis not present

## 2023-08-03 DIAGNOSIS — D631 Anemia in chronic kidney disease: Secondary | ICD-10-CM | POA: Diagnosis not present

## 2023-08-03 DIAGNOSIS — N184 Chronic kidney disease, stage 4 (severe): Secondary | ICD-10-CM | POA: Diagnosis not present

## 2023-08-03 DIAGNOSIS — I5033 Acute on chronic diastolic (congestive) heart failure: Secondary | ICD-10-CM | POA: Diagnosis not present

## 2023-08-03 NOTE — Telephone Encounter (Signed)
Copied from CRM [id number]. Topic: Clinical - Medication Refill >> Aug 03, 2023  2:29 PM Maxwell Marion wrote: Most Recent Primary Care Visit:  Provider: [name]  Department: [facility]  Visit Type: HOSPITAL FU  Date: 05/31/2023  Medication: Oxycodone HCl 10 MG TABS  Has the patient contacted their pharmacy? Yes, pharmacy advised pt to contact the doctor for refill (Agent: If no, request that the patient contact the pharmacy for the refill. If patient does not wish to contact the pharmacy document the reason why and proceed with request.) (Agent: If yes, when and what did the pharmacy advise?)  Is this the correct pharmacy for this prescription? Yes If no, delete pharmacy and type the correct one.  This is the patient's preferred pharmacy:  [facility] [address] [place] [address] Phone: [phone]    Has the prescription been filled recently?   Is the patient out of the medication?   Has the patient been seen for an appointment in the last year OR does the patient have an upcoming appointment?   Can we respond through MyChart?   Agent: Please be advised that Rx refills may take up to 3 business days. We ask that you follow-up with your pharmacy.

## 2023-08-03 NOTE — Telephone Encounter (Signed)
Last OV: 05/31/23 Next OV: no appt scheduled Last RF: 06/14/23

## 2023-08-04 ENCOUNTER — Telehealth: Payer: Self-pay

## 2023-08-04 NOTE — Telephone Encounter (Signed)
Form faxed to [facility]

## 2023-08-10 ENCOUNTER — Other Ambulatory Visit: Payer: Self-pay

## 2023-08-10 DIAGNOSIS — F1190 Opioid use, unspecified, uncomplicated: Secondary | ICD-10-CM

## 2023-08-10 NOTE — Telephone Encounter (Signed)
 Copied from CRM [id number]. Topic: Clinical - Medication Refill >> Aug 10, 2023 11:32 AM Joesph PARAS wrote: Most Recent Primary Care Visit:  Provider: WILLO MINI  Department: [facility]  Visit Type: HOSPITAL FU  Date: 05/31/2023  Medication: Oxycodone  HCl 10 MG TABS  Has the patient contacted their pharmacy? Yes (Agent: If no, request that the patient contact the pharmacy for the refill. If patient does not wish to contact the pharmacy document the reason why and proceed with request.) (Agent: If yes, when and what did the pharmacy advise?)  Is this the correct pharmacy for this prescription? Yes If no, delete pharmacy and type the correct one.  This is the patient's preferred pharmacy:   [id number] - [place], [place] - [address] AT [facility] OF [address] [place] [address] Phone: [phone] Fax: [phone]   Has the prescription been filled recently? Yes  Is the patient out of the medication? Yes  Has the patient been seen for an appointment in the last year OR does the patient have an upcoming appointment? Yes  Can we respond through MyChart? No  Agent: Please be advised that Rx refills may take up to 3 business days. We ask that you follow-up with your pharmacy.

## 2023-08-11 ENCOUNTER — Telehealth: Payer: Self-pay

## 2023-08-11 DIAGNOSIS — I152 Hypertension secondary to endocrine disorders: Secondary | ICD-10-CM | POA: Diagnosis not present

## 2023-08-11 DIAGNOSIS — E1159 Type 2 diabetes mellitus with other circulatory complications: Secondary | ICD-10-CM | POA: Diagnosis not present

## 2023-08-11 DIAGNOSIS — Z4781 Encounter for orthopedic aftercare following surgical amputation: Secondary | ICD-10-CM | POA: Diagnosis not present

## 2023-08-11 DIAGNOSIS — N184 Chronic kidney disease, stage 4 (severe): Secondary | ICD-10-CM | POA: Diagnosis not present

## 2023-08-11 DIAGNOSIS — D631 Anemia in chronic kidney disease: Secondary | ICD-10-CM | POA: Diagnosis not present

## 2023-08-11 DIAGNOSIS — G929 Unspecified toxic encephalopathy: Secondary | ICD-10-CM | POA: Diagnosis not present

## 2023-08-11 DIAGNOSIS — I5033 Acute on chronic diastolic (congestive) heart failure: Secondary | ICD-10-CM | POA: Diagnosis not present

## 2023-08-11 DIAGNOSIS — E1122 Type 2 diabetes mellitus with diabetic chronic kidney disease: Secondary | ICD-10-CM | POA: Diagnosis not present

## 2023-08-11 DIAGNOSIS — E1151 Type 2 diabetes mellitus with diabetic peripheral angiopathy without gangrene: Secondary | ICD-10-CM | POA: Diagnosis not present

## 2023-08-12 DIAGNOSIS — E1159 Type 2 diabetes mellitus with other circulatory complications: Secondary | ICD-10-CM | POA: Diagnosis not present

## 2023-08-12 DIAGNOSIS — Z4781 Encounter for orthopedic aftercare following surgical amputation: Secondary | ICD-10-CM | POA: Diagnosis not present

## 2023-08-12 DIAGNOSIS — I152 Hypertension secondary to endocrine disorders: Secondary | ICD-10-CM | POA: Diagnosis not present

## 2023-08-12 DIAGNOSIS — E1122 Type 2 diabetes mellitus with diabetic chronic kidney disease: Secondary | ICD-10-CM | POA: Diagnosis not present

## 2023-08-12 DIAGNOSIS — G929 Unspecified toxic encephalopathy: Secondary | ICD-10-CM | POA: Diagnosis not present

## 2023-08-12 DIAGNOSIS — N184 Chronic kidney disease, stage 4 (severe): Secondary | ICD-10-CM | POA: Diagnosis not present

## 2023-08-12 DIAGNOSIS — D631 Anemia in chronic kidney disease: Secondary | ICD-10-CM | POA: Diagnosis not present

## 2023-08-12 DIAGNOSIS — I5033 Acute on chronic diastolic (congestive) heart failure: Secondary | ICD-10-CM | POA: Diagnosis not present

## 2023-08-12 DIAGNOSIS — E1151 Type 2 diabetes mellitus with diabetic peripheral angiopathy without gangrene: Secondary | ICD-10-CM | POA: Diagnosis not present

## 2023-08-14 DIAGNOSIS — T8789 Other complications of amputation stump: Secondary | ICD-10-CM | POA: Diagnosis not present

## 2023-08-14 DIAGNOSIS — N184 Chronic kidney disease, stage 4 (severe): Secondary | ICD-10-CM | POA: Diagnosis not present

## 2023-08-14 DIAGNOSIS — S91101A Unspecified open wound of right great toe without damage to nail, initial encounter: Secondary | ICD-10-CM | POA: Diagnosis not present

## 2023-08-14 DIAGNOSIS — E119 Type 2 diabetes mellitus without complications: Secondary | ICD-10-CM | POA: Diagnosis not present

## 2023-08-14 DIAGNOSIS — E1122 Type 2 diabetes mellitus with diabetic chronic kidney disease: Secondary | ICD-10-CM | POA: Diagnosis not present

## 2023-08-14 DIAGNOSIS — T8754 Necrosis of amputation stump, left lower extremity: Secondary | ICD-10-CM | POA: Diagnosis not present

## 2023-08-14 DIAGNOSIS — F17210 Nicotine dependence, cigarettes, uncomplicated: Secondary | ICD-10-CM | POA: Diagnosis not present

## 2023-08-14 DIAGNOSIS — E11628 Type 2 diabetes mellitus with other skin complications: Secondary | ICD-10-CM | POA: Diagnosis not present

## 2023-08-14 DIAGNOSIS — E876 Hypokalemia: Secondary | ICD-10-CM | POA: Diagnosis not present

## 2023-08-14 DIAGNOSIS — I2510 Atherosclerotic heart disease of native coronary artery without angina pectoris: Secondary | ICD-10-CM | POA: Diagnosis not present

## 2023-08-14 DIAGNOSIS — I509 Heart failure, unspecified: Secondary | ICD-10-CM | POA: Diagnosis not present

## 2023-08-14 DIAGNOSIS — G40909 Epilepsy, unspecified, not intractable, without status epilepticus: Secondary | ICD-10-CM | POA: Insufficient documentation

## 2023-08-14 DIAGNOSIS — Z95828 Presence of other vascular implants and grafts: Secondary | ICD-10-CM | POA: Diagnosis not present

## 2023-08-14 DIAGNOSIS — Z89512 Acquired absence of left leg below knee: Secondary | ICD-10-CM | POA: Diagnosis not present

## 2023-08-14 DIAGNOSIS — K922 Gastrointestinal hemorrhage, unspecified: Secondary | ICD-10-CM | POA: Diagnosis not present

## 2023-08-14 DIAGNOSIS — M7989 Other specified soft tissue disorders: Secondary | ICD-10-CM | POA: Diagnosis not present

## 2023-08-14 DIAGNOSIS — E08621 Diabetes mellitus due to underlying condition with foot ulcer: Secondary | ICD-10-CM | POA: Diagnosis not present

## 2023-08-14 DIAGNOSIS — I5033 Acute on chronic diastolic (congestive) heart failure: Secondary | ICD-10-CM | POA: Diagnosis not present

## 2023-08-14 DIAGNOSIS — I152 Hypertension secondary to endocrine disorders: Secondary | ICD-10-CM | POA: Diagnosis not present

## 2023-08-14 DIAGNOSIS — I82442 Acute embolism and thrombosis of left tibial vein: Secondary | ICD-10-CM | POA: Diagnosis not present

## 2023-08-14 DIAGNOSIS — T8744 Infection of amputation stump, left lower extremity: Secondary | ICD-10-CM | POA: Diagnosis not present

## 2023-08-14 DIAGNOSIS — Z743 Need for continuous supervision: Secondary | ICD-10-CM | POA: Diagnosis not present

## 2023-08-14 DIAGNOSIS — I82441 Acute embolism and thrombosis of right tibial vein: Secondary | ICD-10-CM | POA: Diagnosis not present

## 2023-08-14 DIAGNOSIS — I82401 Acute embolism and thrombosis of unspecified deep veins of right lower extremity: Secondary | ICD-10-CM | POA: Diagnosis not present

## 2023-08-14 DIAGNOSIS — L089 Local infection of the skin and subcutaneous tissue, unspecified: Secondary | ICD-10-CM | POA: Diagnosis not present

## 2023-08-14 DIAGNOSIS — M79671 Pain in right foot: Secondary | ICD-10-CM | POA: Diagnosis not present

## 2023-08-14 DIAGNOSIS — I82409 Acute embolism and thrombosis of unspecified deep veins of unspecified lower extremity: Secondary | ICD-10-CM | POA: Diagnosis not present

## 2023-08-14 DIAGNOSIS — N179 Acute kidney failure, unspecified: Secondary | ICD-10-CM | POA: Diagnosis not present

## 2023-08-14 DIAGNOSIS — Z8673 Personal history of transient ischemic attack (TIA), and cerebral infarction without residual deficits: Secondary | ICD-10-CM | POA: Diagnosis not present

## 2023-08-14 DIAGNOSIS — G929 Unspecified toxic encephalopathy: Secondary | ICD-10-CM | POA: Diagnosis not present

## 2023-08-14 DIAGNOSIS — E1151 Type 2 diabetes mellitus with diabetic peripheral angiopathy without gangrene: Secondary | ICD-10-CM | POA: Diagnosis not present

## 2023-08-14 DIAGNOSIS — I824Y1 Acute embolism and thrombosis of unspecified deep veins of right proximal lower extremity: Secondary | ICD-10-CM | POA: Diagnosis not present

## 2023-08-14 DIAGNOSIS — I739 Peripheral vascular disease, unspecified: Secondary | ICD-10-CM | POA: Diagnosis not present

## 2023-08-14 DIAGNOSIS — I824Y9 Acute embolism and thrombosis of unspecified deep veins of unspecified proximal lower extremity: Secondary | ICD-10-CM | POA: Diagnosis not present

## 2023-08-14 DIAGNOSIS — R079 Chest pain, unspecified: Secondary | ICD-10-CM | POA: Diagnosis not present

## 2023-08-14 DIAGNOSIS — T879 Unspecified complications of amputation stump: Secondary | ICD-10-CM | POA: Diagnosis not present

## 2023-08-14 DIAGNOSIS — I824Z1 Acute embolism and thrombosis of unspecified deep veins of right distal lower extremity: Secondary | ICD-10-CM | POA: Diagnosis not present

## 2023-08-14 DIAGNOSIS — I5032 Chronic diastolic (congestive) heart failure: Secondary | ICD-10-CM | POA: Diagnosis not present

## 2023-08-14 DIAGNOSIS — L97412 Non-pressure chronic ulcer of right heel and midfoot with fat layer exposed: Secondary | ICD-10-CM | POA: Diagnosis not present

## 2023-08-14 DIAGNOSIS — Z4781 Encounter for orthopedic aftercare following surgical amputation: Secondary | ICD-10-CM | POA: Diagnosis not present

## 2023-08-14 DIAGNOSIS — E11621 Type 2 diabetes mellitus with foot ulcer: Secondary | ICD-10-CM | POA: Diagnosis not present

## 2023-08-14 DIAGNOSIS — L97519 Non-pressure chronic ulcer of other part of right foot with unspecified severity: Secondary | ICD-10-CM | POA: Diagnosis not present

## 2023-08-14 DIAGNOSIS — K921 Melena: Secondary | ICD-10-CM | POA: Diagnosis not present

## 2023-08-14 DIAGNOSIS — D631 Anemia in chronic kidney disease: Secondary | ICD-10-CM | POA: Diagnosis not present

## 2023-08-14 DIAGNOSIS — I959 Hypotension, unspecified: Secondary | ICD-10-CM | POA: Diagnosis not present

## 2023-08-14 DIAGNOSIS — L97509 Non-pressure chronic ulcer of other part of unspecified foot with unspecified severity: Secondary | ICD-10-CM | POA: Diagnosis not present

## 2023-08-14 DIAGNOSIS — I110 Hypertensive heart disease with heart failure: Secondary | ICD-10-CM | POA: Diagnosis not present

## 2023-08-14 DIAGNOSIS — E1159 Type 2 diabetes mellitus with other circulatory complications: Secondary | ICD-10-CM | POA: Diagnosis not present

## 2023-08-14 DIAGNOSIS — I5022 Chronic systolic (congestive) heart failure: Secondary | ICD-10-CM | POA: Diagnosis not present

## 2023-08-14 DIAGNOSIS — I5020 Unspecified systolic (congestive) heart failure: Secondary | ICD-10-CM | POA: Diagnosis not present

## 2023-08-14 DIAGNOSIS — D649 Anemia, unspecified: Secondary | ICD-10-CM | POA: Diagnosis not present

## 2023-08-15 DIAGNOSIS — L089 Local infection of the skin and subcutaneous tissue, unspecified: Secondary | ICD-10-CM | POA: Diagnosis not present

## 2023-08-15 DIAGNOSIS — E11628 Type 2 diabetes mellitus with other skin complications: Secondary | ICD-10-CM | POA: Diagnosis not present

## 2023-08-15 NOTE — Telephone Encounter (Signed)
 Appt scheduled for 08/23/23

## 2023-08-16 DIAGNOSIS — E11628 Type 2 diabetes mellitus with other skin complications: Secondary | ICD-10-CM | POA: Diagnosis not present

## 2023-08-16 DIAGNOSIS — L089 Local infection of the skin and subcutaneous tissue, unspecified: Secondary | ICD-10-CM | POA: Diagnosis not present

## 2023-08-17 DIAGNOSIS — E08621 Diabetes mellitus due to underlying condition with foot ulcer: Secondary | ICD-10-CM | POA: Diagnosis not present

## 2023-08-17 DIAGNOSIS — I739 Peripheral vascular disease, unspecified: Secondary | ICD-10-CM | POA: Diagnosis not present

## 2023-08-17 DIAGNOSIS — T879 Unspecified complications of amputation stump: Secondary | ICD-10-CM | POA: Diagnosis not present

## 2023-08-17 DIAGNOSIS — L089 Local infection of the skin and subcutaneous tissue, unspecified: Secondary | ICD-10-CM | POA: Diagnosis not present

## 2023-08-17 DIAGNOSIS — E11628 Type 2 diabetes mellitus with other skin complications: Secondary | ICD-10-CM | POA: Diagnosis not present

## 2023-08-17 DIAGNOSIS — L97412 Non-pressure chronic ulcer of right heel and midfoot with fat layer exposed: Secondary | ICD-10-CM | POA: Diagnosis not present

## 2023-08-18 DIAGNOSIS — T8789 Other complications of amputation stump: Secondary | ICD-10-CM | POA: Diagnosis not present

## 2023-08-18 DIAGNOSIS — E1151 Type 2 diabetes mellitus with diabetic peripheral angiopathy without gangrene: Secondary | ICD-10-CM | POA: Diagnosis not present

## 2023-08-18 DIAGNOSIS — L97412 Non-pressure chronic ulcer of right heel and midfoot with fat layer exposed: Secondary | ICD-10-CM | POA: Diagnosis not present

## 2023-08-18 DIAGNOSIS — E11621 Type 2 diabetes mellitus with foot ulcer: Secondary | ICD-10-CM | POA: Diagnosis not present

## 2023-08-18 DIAGNOSIS — Z89512 Acquired absence of left leg below knee: Secondary | ICD-10-CM | POA: Diagnosis not present

## 2023-08-18 DIAGNOSIS — T8754 Necrosis of amputation stump, left lower extremity: Secondary | ICD-10-CM | POA: Diagnosis not present

## 2023-08-18 DIAGNOSIS — T879 Unspecified complications of amputation stump: Secondary | ICD-10-CM | POA: Diagnosis not present

## 2023-08-18 DIAGNOSIS — I509 Heart failure, unspecified: Secondary | ICD-10-CM | POA: Diagnosis not present

## 2023-08-18 DIAGNOSIS — D649 Anemia, unspecified: Secondary | ICD-10-CM | POA: Diagnosis not present

## 2023-08-18 DIAGNOSIS — I2510 Atherosclerotic heart disease of native coronary artery without angina pectoris: Secondary | ICD-10-CM | POA: Diagnosis not present

## 2023-08-18 DIAGNOSIS — T8744 Infection of amputation stump, left lower extremity: Secondary | ICD-10-CM | POA: Diagnosis not present

## 2023-08-18 DIAGNOSIS — M7989 Other specified soft tissue disorders: Secondary | ICD-10-CM | POA: Diagnosis not present

## 2023-08-18 DIAGNOSIS — F17210 Nicotine dependence, cigarettes, uncomplicated: Secondary | ICD-10-CM | POA: Diagnosis not present

## 2023-08-18 DIAGNOSIS — I110 Hypertensive heart disease with heart failure: Secondary | ICD-10-CM | POA: Diagnosis not present

## 2023-08-18 DIAGNOSIS — I739 Peripheral vascular disease, unspecified: Secondary | ICD-10-CM | POA: Diagnosis not present

## 2023-08-18 DIAGNOSIS — E11628 Type 2 diabetes mellitus with other skin complications: Secondary | ICD-10-CM | POA: Diagnosis not present

## 2023-08-19 DIAGNOSIS — D649 Anemia, unspecified: Secondary | ICD-10-CM | POA: Diagnosis not present

## 2023-08-19 DIAGNOSIS — L97412 Non-pressure chronic ulcer of right heel and midfoot with fat layer exposed: Secondary | ICD-10-CM | POA: Diagnosis not present

## 2023-08-19 DIAGNOSIS — E1151 Type 2 diabetes mellitus with diabetic peripheral angiopathy without gangrene: Secondary | ICD-10-CM | POA: Diagnosis not present

## 2023-08-19 DIAGNOSIS — L089 Local infection of the skin and subcutaneous tissue, unspecified: Secondary | ICD-10-CM | POA: Diagnosis not present

## 2023-08-19 DIAGNOSIS — E11621 Type 2 diabetes mellitus with foot ulcer: Secondary | ICD-10-CM | POA: Diagnosis not present

## 2023-08-19 DIAGNOSIS — E11628 Type 2 diabetes mellitus with other skin complications: Secondary | ICD-10-CM | POA: Diagnosis not present

## 2023-08-19 DIAGNOSIS — I739 Peripheral vascular disease, unspecified: Secondary | ICD-10-CM | POA: Diagnosis not present

## 2023-08-19 DIAGNOSIS — T879 Unspecified complications of amputation stump: Secondary | ICD-10-CM | POA: Diagnosis not present

## 2023-08-20 DIAGNOSIS — D649 Anemia, unspecified: Secondary | ICD-10-CM | POA: Diagnosis not present

## 2023-08-20 DIAGNOSIS — I739 Peripheral vascular disease, unspecified: Secondary | ICD-10-CM | POA: Diagnosis not present

## 2023-08-20 DIAGNOSIS — E11621 Type 2 diabetes mellitus with foot ulcer: Secondary | ICD-10-CM | POA: Diagnosis not present

## 2023-08-20 DIAGNOSIS — L97412 Non-pressure chronic ulcer of right heel and midfoot with fat layer exposed: Secondary | ICD-10-CM | POA: Diagnosis not present

## 2023-08-20 DIAGNOSIS — T879 Unspecified complications of amputation stump: Secondary | ICD-10-CM | POA: Diagnosis not present

## 2023-08-20 DIAGNOSIS — E1151 Type 2 diabetes mellitus with diabetic peripheral angiopathy without gangrene: Secondary | ICD-10-CM | POA: Diagnosis not present

## 2023-08-21 DIAGNOSIS — E119 Type 2 diabetes mellitus without complications: Secondary | ICD-10-CM | POA: Diagnosis not present

## 2023-08-21 DIAGNOSIS — I110 Hypertensive heart disease with heart failure: Secondary | ICD-10-CM | POA: Diagnosis not present

## 2023-08-21 DIAGNOSIS — E11621 Type 2 diabetes mellitus with foot ulcer: Secondary | ICD-10-CM | POA: Diagnosis not present

## 2023-08-21 DIAGNOSIS — E1151 Type 2 diabetes mellitus with diabetic peripheral angiopathy without gangrene: Secondary | ICD-10-CM | POA: Diagnosis not present

## 2023-08-21 DIAGNOSIS — L97412 Non-pressure chronic ulcer of right heel and midfoot with fat layer exposed: Secondary | ICD-10-CM | POA: Diagnosis not present

## 2023-08-21 DIAGNOSIS — I739 Peripheral vascular disease, unspecified: Secondary | ICD-10-CM | POA: Diagnosis not present

## 2023-08-21 DIAGNOSIS — T879 Unspecified complications of amputation stump: Secondary | ICD-10-CM | POA: Diagnosis not present

## 2023-08-21 DIAGNOSIS — D649 Anemia, unspecified: Secondary | ICD-10-CM | POA: Diagnosis not present

## 2023-08-21 DIAGNOSIS — I2510 Atherosclerotic heart disease of native coronary artery without angina pectoris: Secondary | ICD-10-CM | POA: Diagnosis not present

## 2023-08-21 DIAGNOSIS — E11628 Type 2 diabetes mellitus with other skin complications: Secondary | ICD-10-CM | POA: Diagnosis not present

## 2023-08-21 DIAGNOSIS — Z8673 Personal history of transient ischemic attack (TIA), and cerebral infarction without residual deficits: Secondary | ICD-10-CM | POA: Diagnosis not present

## 2023-08-21 DIAGNOSIS — Z89512 Acquired absence of left leg below knee: Secondary | ICD-10-CM | POA: Diagnosis not present

## 2023-08-21 DIAGNOSIS — I5032 Chronic diastolic (congestive) heart failure: Secondary | ICD-10-CM | POA: Diagnosis not present

## 2023-08-21 DIAGNOSIS — L089 Local infection of the skin and subcutaneous tissue, unspecified: Secondary | ICD-10-CM | POA: Diagnosis not present

## 2023-08-21 DIAGNOSIS — T8789 Other complications of amputation stump: Secondary | ICD-10-CM | POA: Diagnosis not present

## 2023-08-22 DIAGNOSIS — L97412 Non-pressure chronic ulcer of right heel and midfoot with fat layer exposed: Secondary | ICD-10-CM | POA: Diagnosis not present

## 2023-08-22 DIAGNOSIS — I739 Peripheral vascular disease, unspecified: Secondary | ICD-10-CM | POA: Diagnosis not present

## 2023-08-22 DIAGNOSIS — I82441 Acute embolism and thrombosis of right tibial vein: Secondary | ICD-10-CM | POA: Diagnosis not present

## 2023-08-22 DIAGNOSIS — E1151 Type 2 diabetes mellitus with diabetic peripheral angiopathy without gangrene: Secondary | ICD-10-CM | POA: Diagnosis not present

## 2023-08-22 DIAGNOSIS — E11628 Type 2 diabetes mellitus with other skin complications: Secondary | ICD-10-CM | POA: Diagnosis not present

## 2023-08-22 DIAGNOSIS — T879 Unspecified complications of amputation stump: Secondary | ICD-10-CM | POA: Diagnosis not present

## 2023-08-22 DIAGNOSIS — E08621 Diabetes mellitus due to underlying condition with foot ulcer: Secondary | ICD-10-CM | POA: Diagnosis not present

## 2023-08-22 DIAGNOSIS — L089 Local infection of the skin and subcutaneous tissue, unspecified: Secondary | ICD-10-CM | POA: Diagnosis not present

## 2023-08-23 ENCOUNTER — Ambulatory Visit: Payer: Medicare HMO | Admitting: Medical-Surgical

## 2023-08-23 DIAGNOSIS — I739 Peripheral vascular disease, unspecified: Secondary | ICD-10-CM | POA: Diagnosis not present

## 2023-08-23 DIAGNOSIS — I82441 Acute embolism and thrombosis of right tibial vein: Secondary | ICD-10-CM | POA: Diagnosis not present

## 2023-08-23 DIAGNOSIS — E11628 Type 2 diabetes mellitus with other skin complications: Secondary | ICD-10-CM | POA: Diagnosis not present

## 2023-08-23 DIAGNOSIS — L089 Local infection of the skin and subcutaneous tissue, unspecified: Secondary | ICD-10-CM | POA: Diagnosis not present

## 2023-08-23 DIAGNOSIS — E1151 Type 2 diabetes mellitus with diabetic peripheral angiopathy without gangrene: Secondary | ICD-10-CM | POA: Diagnosis not present

## 2023-08-23 DIAGNOSIS — T879 Unspecified complications of amputation stump: Secondary | ICD-10-CM | POA: Diagnosis not present

## 2023-08-23 NOTE — Progress Notes (Deleted)
Established patient visit  History, exam, impression, and plan:  No problem-specific Assessment & Plan notes found for this encounter.   ROS  Physical Exam  Procedures performed this visit: None.  No follow-ups on file.  __________________________________ Thayer Ohm, DNP, APRN, FNP-BC Primary Care and Sports Medicine [facility]

## 2023-08-24 DIAGNOSIS — L089 Local infection of the skin and subcutaneous tissue, unspecified: Secondary | ICD-10-CM | POA: Diagnosis not present

## 2023-08-24 DIAGNOSIS — E11628 Type 2 diabetes mellitus with other skin complications: Secondary | ICD-10-CM | POA: Diagnosis not present

## 2023-08-24 DIAGNOSIS — D649 Anemia, unspecified: Secondary | ICD-10-CM | POA: Diagnosis not present

## 2023-08-24 DIAGNOSIS — I739 Peripheral vascular disease, unspecified: Secondary | ICD-10-CM | POA: Diagnosis not present

## 2023-08-24 DIAGNOSIS — T879 Unspecified complications of amputation stump: Secondary | ICD-10-CM | POA: Diagnosis not present

## 2023-08-24 DIAGNOSIS — I82441 Acute embolism and thrombosis of right tibial vein: Secondary | ICD-10-CM | POA: Diagnosis not present

## 2023-08-24 DIAGNOSIS — E1151 Type 2 diabetes mellitus with diabetic peripheral angiopathy without gangrene: Secondary | ICD-10-CM | POA: Diagnosis not present

## 2023-08-25 DIAGNOSIS — T879 Unspecified complications of amputation stump: Secondary | ICD-10-CM | POA: Diagnosis not present

## 2023-08-25 DIAGNOSIS — L97412 Non-pressure chronic ulcer of right heel and midfoot with fat layer exposed: Secondary | ICD-10-CM | POA: Diagnosis not present

## 2023-08-25 DIAGNOSIS — I82441 Acute embolism and thrombosis of right tibial vein: Secondary | ICD-10-CM | POA: Diagnosis not present

## 2023-08-25 DIAGNOSIS — E08621 Diabetes mellitus due to underlying condition with foot ulcer: Secondary | ICD-10-CM | POA: Diagnosis not present

## 2023-08-25 DIAGNOSIS — E11628 Type 2 diabetes mellitus with other skin complications: Secondary | ICD-10-CM | POA: Diagnosis not present

## 2023-08-25 DIAGNOSIS — L089 Local infection of the skin and subcutaneous tissue, unspecified: Secondary | ICD-10-CM | POA: Diagnosis not present

## 2023-08-25 DIAGNOSIS — D649 Anemia, unspecified: Secondary | ICD-10-CM | POA: Diagnosis not present

## 2023-08-25 DIAGNOSIS — K922 Gastrointestinal hemorrhage, unspecified: Secondary | ICD-10-CM | POA: Diagnosis not present

## 2023-08-25 DIAGNOSIS — E1151 Type 2 diabetes mellitus with diabetic peripheral angiopathy without gangrene: Secondary | ICD-10-CM | POA: Diagnosis not present

## 2023-08-25 DIAGNOSIS — K921 Melena: Secondary | ICD-10-CM | POA: Diagnosis not present

## 2023-08-25 DIAGNOSIS — I739 Peripheral vascular disease, unspecified: Secondary | ICD-10-CM | POA: Diagnosis not present

## 2023-08-25 DIAGNOSIS — I82401 Acute embolism and thrombosis of unspecified deep veins of right lower extremity: Secondary | ICD-10-CM | POA: Diagnosis not present

## 2023-08-26 DIAGNOSIS — I82441 Acute embolism and thrombosis of right tibial vein: Secondary | ICD-10-CM | POA: Diagnosis not present

## 2023-08-26 DIAGNOSIS — E11628 Type 2 diabetes mellitus with other skin complications: Secondary | ICD-10-CM | POA: Diagnosis not present

## 2023-08-26 DIAGNOSIS — L089 Local infection of the skin and subcutaneous tissue, unspecified: Secondary | ICD-10-CM | POA: Diagnosis not present

## 2023-08-26 DIAGNOSIS — I82409 Acute embolism and thrombosis of unspecified deep veins of unspecified lower extremity: Secondary | ICD-10-CM | POA: Diagnosis not present

## 2023-08-26 DIAGNOSIS — K921 Melena: Secondary | ICD-10-CM | POA: Diagnosis not present

## 2023-08-26 DIAGNOSIS — I739 Peripheral vascular disease, unspecified: Secondary | ICD-10-CM | POA: Diagnosis not present

## 2023-08-26 DIAGNOSIS — E1151 Type 2 diabetes mellitus with diabetic peripheral angiopathy without gangrene: Secondary | ICD-10-CM | POA: Diagnosis not present

## 2023-08-26 DIAGNOSIS — D649 Anemia, unspecified: Secondary | ICD-10-CM | POA: Diagnosis not present

## 2023-08-26 DIAGNOSIS — Z95828 Presence of other vascular implants and grafts: Secondary | ICD-10-CM | POA: Diagnosis not present

## 2023-08-26 DIAGNOSIS — T879 Unspecified complications of amputation stump: Secondary | ICD-10-CM | POA: Diagnosis not present

## 2023-08-27 DIAGNOSIS — E1151 Type 2 diabetes mellitus with diabetic peripheral angiopathy without gangrene: Secondary | ICD-10-CM | POA: Diagnosis not present

## 2023-08-27 DIAGNOSIS — I82441 Acute embolism and thrombosis of right tibial vein: Secondary | ICD-10-CM | POA: Diagnosis not present

## 2023-08-27 DIAGNOSIS — L089 Local infection of the skin and subcutaneous tissue, unspecified: Secondary | ICD-10-CM | POA: Diagnosis not present

## 2023-08-27 DIAGNOSIS — E11628 Type 2 diabetes mellitus with other skin complications: Secondary | ICD-10-CM | POA: Diagnosis not present

## 2023-08-27 DIAGNOSIS — T879 Unspecified complications of amputation stump: Secondary | ICD-10-CM | POA: Diagnosis not present

## 2023-08-27 DIAGNOSIS — D649 Anemia, unspecified: Secondary | ICD-10-CM | POA: Diagnosis not present

## 2023-08-27 DIAGNOSIS — I739 Peripheral vascular disease, unspecified: Secondary | ICD-10-CM | POA: Diagnosis not present

## 2023-08-28 DIAGNOSIS — T879 Unspecified complications of amputation stump: Secondary | ICD-10-CM | POA: Diagnosis not present

## 2023-08-28 DIAGNOSIS — I739 Peripheral vascular disease, unspecified: Secondary | ICD-10-CM | POA: Diagnosis not present

## 2023-08-28 DIAGNOSIS — E11628 Type 2 diabetes mellitus with other skin complications: Secondary | ICD-10-CM | POA: Diagnosis not present

## 2023-08-28 DIAGNOSIS — L089 Local infection of the skin and subcutaneous tissue, unspecified: Secondary | ICD-10-CM | POA: Diagnosis not present

## 2023-08-28 DIAGNOSIS — I82441 Acute embolism and thrombosis of right tibial vein: Secondary | ICD-10-CM | POA: Diagnosis not present

## 2023-08-28 DIAGNOSIS — E1151 Type 2 diabetes mellitus with diabetic peripheral angiopathy without gangrene: Secondary | ICD-10-CM | POA: Diagnosis not present

## 2023-08-28 DIAGNOSIS — D649 Anemia, unspecified: Secondary | ICD-10-CM | POA: Diagnosis not present

## 2023-08-29 DIAGNOSIS — E11628 Type 2 diabetes mellitus with other skin complications: Secondary | ICD-10-CM | POA: Diagnosis not present

## 2023-08-29 DIAGNOSIS — I739 Peripheral vascular disease, unspecified: Secondary | ICD-10-CM | POA: Diagnosis not present

## 2023-08-29 DIAGNOSIS — I824Y9 Acute embolism and thrombosis of unspecified deep veins of unspecified proximal lower extremity: Secondary | ICD-10-CM | POA: Diagnosis not present

## 2023-08-29 DIAGNOSIS — L089 Local infection of the skin and subcutaneous tissue, unspecified: Secondary | ICD-10-CM | POA: Diagnosis not present

## 2023-08-30 DIAGNOSIS — I739 Peripheral vascular disease, unspecified: Secondary | ICD-10-CM | POA: Diagnosis not present

## 2023-08-30 DIAGNOSIS — I824Y9 Acute embolism and thrombosis of unspecified deep veins of unspecified proximal lower extremity: Secondary | ICD-10-CM | POA: Diagnosis not present

## 2023-08-30 DIAGNOSIS — L089 Local infection of the skin and subcutaneous tissue, unspecified: Secondary | ICD-10-CM | POA: Diagnosis not present

## 2023-08-30 DIAGNOSIS — E11628 Type 2 diabetes mellitus with other skin complications: Secondary | ICD-10-CM | POA: Diagnosis not present

## 2023-08-31 DIAGNOSIS — I5022 Chronic systolic (congestive) heart failure: Secondary | ICD-10-CM | POA: Diagnosis not present

## 2023-08-31 DIAGNOSIS — E1151 Type 2 diabetes mellitus with diabetic peripheral angiopathy without gangrene: Secondary | ICD-10-CM | POA: Diagnosis not present

## 2023-08-31 DIAGNOSIS — I959 Hypotension, unspecified: Secondary | ICD-10-CM | POA: Diagnosis not present

## 2023-08-31 DIAGNOSIS — L89610 Pressure ulcer of right heel, unstageable: Secondary | ICD-10-CM | POA: Diagnosis not present

## 2023-08-31 DIAGNOSIS — I110 Hypertensive heart disease with heart failure: Secondary | ICD-10-CM | POA: Diagnosis not present

## 2023-08-31 DIAGNOSIS — L97519 Non-pressure chronic ulcer of other part of right foot with unspecified severity: Secondary | ICD-10-CM | POA: Diagnosis not present

## 2023-08-31 DIAGNOSIS — N184 Chronic kidney disease, stage 4 (severe): Secondary | ICD-10-CM | POA: Diagnosis not present

## 2023-08-31 DIAGNOSIS — I739 Peripheral vascular disease, unspecified: Secondary | ICD-10-CM | POA: Diagnosis not present

## 2023-08-31 DIAGNOSIS — I2510 Atherosclerotic heart disease of native coronary artery without angina pectoris: Secondary | ICD-10-CM | POA: Diagnosis not present

## 2023-08-31 DIAGNOSIS — I130 Hypertensive heart and chronic kidney disease with heart failure and stage 1 through stage 4 chronic kidney disease, or unspecified chronic kidney disease: Secondary | ICD-10-CM | POA: Diagnosis not present

## 2023-08-31 DIAGNOSIS — R5381 Other malaise: Secondary | ICD-10-CM | POA: Diagnosis not present

## 2023-08-31 DIAGNOSIS — E1159 Type 2 diabetes mellitus with other circulatory complications: Secondary | ICD-10-CM | POA: Diagnosis not present

## 2023-08-31 DIAGNOSIS — E11621 Type 2 diabetes mellitus with foot ulcer: Secondary | ICD-10-CM | POA: Diagnosis not present

## 2023-08-31 DIAGNOSIS — L97509 Non-pressure chronic ulcer of other part of unspecified foot with unspecified severity: Secondary | ICD-10-CM | POA: Diagnosis not present

## 2023-08-31 DIAGNOSIS — Z89512 Acquired absence of left leg below knee: Secondary | ICD-10-CM | POA: Diagnosis not present

## 2023-08-31 DIAGNOSIS — D631 Anemia in chronic kidney disease: Secondary | ICD-10-CM | POA: Diagnosis not present

## 2023-08-31 DIAGNOSIS — I82441 Acute embolism and thrombosis of right tibial vein: Secondary | ICD-10-CM | POA: Diagnosis not present

## 2023-08-31 DIAGNOSIS — I824Y9 Acute embolism and thrombosis of unspecified deep veins of unspecified proximal lower extremity: Secondary | ICD-10-CM | POA: Diagnosis not present

## 2023-08-31 DIAGNOSIS — L089 Local infection of the skin and subcutaneous tissue, unspecified: Secondary | ICD-10-CM | POA: Diagnosis not present

## 2023-08-31 DIAGNOSIS — E1122 Type 2 diabetes mellitus with diabetic chronic kidney disease: Secondary | ICD-10-CM | POA: Diagnosis not present

## 2023-08-31 DIAGNOSIS — I5020 Unspecified systolic (congestive) heart failure: Secondary | ICD-10-CM | POA: Diagnosis not present

## 2023-08-31 DIAGNOSIS — L97419 Non-pressure chronic ulcer of right heel and midfoot with unspecified severity: Secondary | ICD-10-CM | POA: Diagnosis not present

## 2023-08-31 DIAGNOSIS — E1140 Type 2 diabetes mellitus with diabetic neuropathy, unspecified: Secondary | ICD-10-CM | POA: Diagnosis not present

## 2023-08-31 DIAGNOSIS — T879 Unspecified complications of amputation stump: Secondary | ICD-10-CM | POA: Diagnosis not present

## 2023-08-31 DIAGNOSIS — Z743 Need for continuous supervision: Secondary | ICD-10-CM | POA: Diagnosis not present

## 2023-08-31 DIAGNOSIS — E11628 Type 2 diabetes mellitus with other skin complications: Secondary | ICD-10-CM | POA: Diagnosis not present

## 2023-08-31 DIAGNOSIS — Z794 Long term (current) use of insulin: Secondary | ICD-10-CM | POA: Diagnosis not present

## 2023-08-31 DIAGNOSIS — Z4789 Encounter for other orthopedic aftercare: Secondary | ICD-10-CM | POA: Diagnosis not present

## 2023-09-04 DIAGNOSIS — N184 Chronic kidney disease, stage 4 (severe): Secondary | ICD-10-CM | POA: Diagnosis not present

## 2023-09-04 DIAGNOSIS — R5381 Other malaise: Secondary | ICD-10-CM | POA: Diagnosis not present

## 2023-09-04 DIAGNOSIS — T879 Unspecified complications of amputation stump: Secondary | ICD-10-CM | POA: Diagnosis not present

## 2023-09-04 DIAGNOSIS — Z794 Long term (current) use of insulin: Secondary | ICD-10-CM | POA: Diagnosis not present

## 2023-09-04 DIAGNOSIS — E11621 Type 2 diabetes mellitus with foot ulcer: Secondary | ICD-10-CM | POA: Diagnosis not present

## 2023-09-04 DIAGNOSIS — E1122 Type 2 diabetes mellitus with diabetic chronic kidney disease: Secondary | ICD-10-CM | POA: Diagnosis not present

## 2023-09-04 DIAGNOSIS — Z89512 Acquired absence of left leg below knee: Secondary | ICD-10-CM | POA: Diagnosis not present

## 2023-09-04 DIAGNOSIS — L97509 Non-pressure chronic ulcer of other part of unspecified foot with unspecified severity: Secondary | ICD-10-CM | POA: Diagnosis not present

## 2023-09-04 DIAGNOSIS — Z4789 Encounter for other orthopedic aftercare: Secondary | ICD-10-CM | POA: Diagnosis not present

## 2023-09-05 DIAGNOSIS — L89610 Pressure ulcer of right heel, unstageable: Secondary | ICD-10-CM | POA: Diagnosis not present

## 2023-09-05 DIAGNOSIS — L97519 Non-pressure chronic ulcer of other part of right foot with unspecified severity: Secondary | ICD-10-CM | POA: Diagnosis not present

## 2023-09-05 DIAGNOSIS — Z794 Long term (current) use of insulin: Secondary | ICD-10-CM | POA: Diagnosis not present

## 2023-09-05 DIAGNOSIS — E1140 Type 2 diabetes mellitus with diabetic neuropathy, unspecified: Secondary | ICD-10-CM | POA: Diagnosis not present

## 2023-09-05 DIAGNOSIS — N184 Chronic kidney disease, stage 4 (severe): Secondary | ICD-10-CM | POA: Diagnosis not present

## 2023-09-05 DIAGNOSIS — I739 Peripheral vascular disease, unspecified: Secondary | ICD-10-CM | POA: Diagnosis not present

## 2023-09-11 DIAGNOSIS — E1159 Type 2 diabetes mellitus with other circulatory complications: Secondary | ICD-10-CM | POA: Diagnosis not present

## 2023-09-11 DIAGNOSIS — E11621 Type 2 diabetes mellitus with foot ulcer: Secondary | ICD-10-CM | POA: Diagnosis not present

## 2023-09-11 DIAGNOSIS — D631 Anemia in chronic kidney disease: Secondary | ICD-10-CM | POA: Diagnosis not present

## 2023-09-11 DIAGNOSIS — I5022 Chronic systolic (congestive) heart failure: Secondary | ICD-10-CM | POA: Diagnosis not present

## 2023-09-11 DIAGNOSIS — I130 Hypertensive heart and chronic kidney disease with heart failure and stage 1 through stage 4 chronic kidney disease, or unspecified chronic kidney disease: Secondary | ICD-10-CM | POA: Diagnosis not present

## 2023-09-11 DIAGNOSIS — E1151 Type 2 diabetes mellitus with diabetic peripheral angiopathy without gangrene: Secondary | ICD-10-CM | POA: Diagnosis not present

## 2023-09-11 DIAGNOSIS — N184 Chronic kidney disease, stage 4 (severe): Secondary | ICD-10-CM | POA: Diagnosis not present

## 2023-09-11 DIAGNOSIS — E1122 Type 2 diabetes mellitus with diabetic chronic kidney disease: Secondary | ICD-10-CM | POA: Diagnosis not present

## 2023-09-11 DIAGNOSIS — L97419 Non-pressure chronic ulcer of right heel and midfoot with unspecified severity: Secondary | ICD-10-CM | POA: Diagnosis not present

## 2023-09-14 DIAGNOSIS — I82441 Acute embolism and thrombosis of right tibial vein: Secondary | ICD-10-CM | POA: Diagnosis not present

## 2023-09-14 DIAGNOSIS — I5022 Chronic systolic (congestive) heart failure: Secondary | ICD-10-CM | POA: Diagnosis not present

## 2023-09-14 DIAGNOSIS — I130 Hypertensive heart and chronic kidney disease with heart failure and stage 1 through stage 4 chronic kidney disease, or unspecified chronic kidney disease: Secondary | ICD-10-CM | POA: Diagnosis not present

## 2023-09-14 DIAGNOSIS — L97509 Non-pressure chronic ulcer of other part of unspecified foot with unspecified severity: Secondary | ICD-10-CM | POA: Diagnosis not present

## 2023-09-14 DIAGNOSIS — Z89512 Acquired absence of left leg below knee: Secondary | ICD-10-CM | POA: Diagnosis not present

## 2023-09-14 DIAGNOSIS — D631 Anemia in chronic kidney disease: Secondary | ICD-10-CM | POA: Diagnosis not present

## 2023-09-14 DIAGNOSIS — Z4789 Encounter for other orthopedic aftercare: Secondary | ICD-10-CM | POA: Diagnosis not present

## 2023-09-14 DIAGNOSIS — I2510 Atherosclerotic heart disease of native coronary artery without angina pectoris: Secondary | ICD-10-CM | POA: Diagnosis not present

## 2023-09-14 DIAGNOSIS — E11621 Type 2 diabetes mellitus with foot ulcer: Secondary | ICD-10-CM | POA: Diagnosis not present

## 2023-09-18 DIAGNOSIS — I5033 Acute on chronic diastolic (congestive) heart failure: Secondary | ICD-10-CM | POA: Diagnosis not present

## 2023-09-18 DIAGNOSIS — Z4781 Encounter for orthopedic aftercare following surgical amputation: Secondary | ICD-10-CM | POA: Diagnosis not present

## 2023-09-18 DIAGNOSIS — E1159 Type 2 diabetes mellitus with other circulatory complications: Secondary | ICD-10-CM | POA: Diagnosis not present

## 2023-09-18 DIAGNOSIS — N184 Chronic kidney disease, stage 4 (severe): Secondary | ICD-10-CM | POA: Diagnosis not present

## 2023-09-18 DIAGNOSIS — I152 Hypertension secondary to endocrine disorders: Secondary | ICD-10-CM | POA: Diagnosis not present

## 2023-09-18 DIAGNOSIS — E1122 Type 2 diabetes mellitus with diabetic chronic kidney disease: Secondary | ICD-10-CM | POA: Diagnosis not present

## 2023-09-18 DIAGNOSIS — E1151 Type 2 diabetes mellitus with diabetic peripheral angiopathy without gangrene: Secondary | ICD-10-CM | POA: Diagnosis not present

## 2023-09-18 DIAGNOSIS — D631 Anemia in chronic kidney disease: Secondary | ICD-10-CM | POA: Diagnosis not present

## 2023-09-18 DIAGNOSIS — G929 Unspecified toxic encephalopathy: Secondary | ICD-10-CM | POA: Diagnosis not present

## 2023-09-19 ENCOUNTER — Inpatient Hospital Stay: Payer: Medicare HMO | Admitting: Medical-Surgical

## 2023-09-20 DIAGNOSIS — I5033 Acute on chronic diastolic (congestive) heart failure: Secondary | ICD-10-CM | POA: Diagnosis not present

## 2023-09-20 DIAGNOSIS — G929 Unspecified toxic encephalopathy: Secondary | ICD-10-CM | POA: Diagnosis not present

## 2023-09-20 DIAGNOSIS — E1159 Type 2 diabetes mellitus with other circulatory complications: Secondary | ICD-10-CM | POA: Diagnosis not present

## 2023-09-20 DIAGNOSIS — I152 Hypertension secondary to endocrine disorders: Secondary | ICD-10-CM | POA: Diagnosis not present

## 2023-09-20 DIAGNOSIS — E1151 Type 2 diabetes mellitus with diabetic peripheral angiopathy without gangrene: Secondary | ICD-10-CM | POA: Diagnosis not present

## 2023-09-20 DIAGNOSIS — Z4781 Encounter for orthopedic aftercare following surgical amputation: Secondary | ICD-10-CM | POA: Diagnosis not present

## 2023-09-20 DIAGNOSIS — D631 Anemia in chronic kidney disease: Secondary | ICD-10-CM | POA: Diagnosis not present

## 2023-09-20 DIAGNOSIS — E1122 Type 2 diabetes mellitus with diabetic chronic kidney disease: Secondary | ICD-10-CM | POA: Diagnosis not present

## 2023-09-20 DIAGNOSIS — N184 Chronic kidney disease, stage 4 (severe): Secondary | ICD-10-CM | POA: Diagnosis not present

## 2023-09-21 ENCOUNTER — Other Ambulatory Visit: Payer: Self-pay | Admitting: Medical-Surgical

## 2023-09-21 DIAGNOSIS — G929 Unspecified toxic encephalopathy: Secondary | ICD-10-CM | POA: Diagnosis not present

## 2023-09-21 DIAGNOSIS — N184 Chronic kidney disease, stage 4 (severe): Secondary | ICD-10-CM | POA: Diagnosis not present

## 2023-09-21 DIAGNOSIS — I5033 Acute on chronic diastolic (congestive) heart failure: Secondary | ICD-10-CM | POA: Diagnosis not present

## 2023-09-21 DIAGNOSIS — D631 Anemia in chronic kidney disease: Secondary | ICD-10-CM | POA: Diagnosis not present

## 2023-09-21 DIAGNOSIS — E1159 Type 2 diabetes mellitus with other circulatory complications: Secondary | ICD-10-CM | POA: Diagnosis not present

## 2023-09-21 DIAGNOSIS — E1122 Type 2 diabetes mellitus with diabetic chronic kidney disease: Secondary | ICD-10-CM | POA: Diagnosis not present

## 2023-09-21 DIAGNOSIS — Z4781 Encounter for orthopedic aftercare following surgical amputation: Secondary | ICD-10-CM | POA: Diagnosis not present

## 2023-09-21 DIAGNOSIS — E1151 Type 2 diabetes mellitus with diabetic peripheral angiopathy without gangrene: Secondary | ICD-10-CM | POA: Diagnosis not present

## 2023-09-21 DIAGNOSIS — I152 Hypertension secondary to endocrine disorders: Secondary | ICD-10-CM | POA: Diagnosis not present

## 2023-09-21 NOTE — Telephone Encounter (Signed)
Copied from CRM [id number]. Topic: Clinical - Medication Refill >> Sep 21, 2023 11:28 AM Nila Nephew wrote: Most Recent Primary Care Visit:  Provider: [name]  Department: [facility]  Visit Type: HOSPITAL FU  Date: 05/31/2023  Medication: Stanyal - States is for wound care  Has the patient contacted their pharmacy? No  Is this the correct pharmacy for this prescription? Yes If no, delete pharmacy and type the correct one.  This is the patient's preferred pharmacy:   [facility] - [place], [place] - [address] [place] [address] Phone: [phone] Fax: [phone]  Has the prescription been filled recently? No  Is the patient out of the medication? Yes  Has the patient been seen for an appointment in the last year OR does the patient have an upcoming appointment? Yes  Can we respond through MyChart? No  Agent: Please be advised that Rx refills may take up to 3 business days. We ask that you follow-up with your pharmacy.

## 2023-09-21 NOTE — Telephone Encounter (Signed)
Patient has not seen me for a hospital discharge follow up or other follow up appointment. He has had two appointments the he did not show for. He needs an appointment for any further refills of his medications since we have not been able to do a medication reconciliation and evaluate his current health care needs.

## 2023-09-21 NOTE — Telephone Encounter (Signed)
Not on medication list.

## 2023-09-22 DIAGNOSIS — I5033 Acute on chronic diastolic (congestive) heart failure: Secondary | ICD-10-CM | POA: Diagnosis not present

## 2023-09-22 DIAGNOSIS — I152 Hypertension secondary to endocrine disorders: Secondary | ICD-10-CM | POA: Diagnosis not present

## 2023-09-22 DIAGNOSIS — G929 Unspecified toxic encephalopathy: Secondary | ICD-10-CM | POA: Diagnosis not present

## 2023-09-22 DIAGNOSIS — Z4781 Encounter for orthopedic aftercare following surgical amputation: Secondary | ICD-10-CM | POA: Diagnosis not present

## 2023-09-22 DIAGNOSIS — D631 Anemia in chronic kidney disease: Secondary | ICD-10-CM | POA: Diagnosis not present

## 2023-09-22 DIAGNOSIS — E1159 Type 2 diabetes mellitus with other circulatory complications: Secondary | ICD-10-CM | POA: Diagnosis not present

## 2023-09-22 DIAGNOSIS — N184 Chronic kidney disease, stage 4 (severe): Secondary | ICD-10-CM | POA: Diagnosis not present

## 2023-09-22 DIAGNOSIS — E1122 Type 2 diabetes mellitus with diabetic chronic kidney disease: Secondary | ICD-10-CM | POA: Diagnosis not present

## 2023-09-22 DIAGNOSIS — E1151 Type 2 diabetes mellitus with diabetic peripheral angiopathy without gangrene: Secondary | ICD-10-CM | POA: Diagnosis not present

## 2023-09-26 DIAGNOSIS — L89152 Pressure ulcer of sacral region, stage 2: Secondary | ICD-10-CM | POA: Diagnosis not present

## 2023-09-26 DIAGNOSIS — L97518 Non-pressure chronic ulcer of other part of right foot with other specified severity: Secondary | ICD-10-CM | POA: Diagnosis not present

## 2023-09-26 DIAGNOSIS — T8781 Dehiscence of amputation stump: Secondary | ICD-10-CM | POA: Diagnosis not present

## 2023-09-26 DIAGNOSIS — L89610 Pressure ulcer of right heel, unstageable: Secondary | ICD-10-CM | POA: Diagnosis not present

## 2023-09-26 DIAGNOSIS — L97828 Non-pressure chronic ulcer of other part of left lower leg with other specified severity: Secondary | ICD-10-CM | POA: Diagnosis not present

## 2023-09-26 DIAGNOSIS — I82441 Acute embolism and thrombosis of right tibial vein: Secondary | ICD-10-CM | POA: Diagnosis not present

## 2023-09-26 DIAGNOSIS — E11622 Type 2 diabetes mellitus with other skin ulcer: Secondary | ICD-10-CM | POA: Diagnosis not present

## 2023-09-26 DIAGNOSIS — G929 Unspecified toxic encephalopathy: Secondary | ICD-10-CM | POA: Diagnosis not present

## 2023-09-26 DIAGNOSIS — E11621 Type 2 diabetes mellitus with foot ulcer: Secondary | ICD-10-CM | POA: Diagnosis not present

## 2023-09-27 ENCOUNTER — Telehealth: Payer: Self-pay | Admitting: Medical-Surgical

## 2023-09-27 NOTE — Telephone Encounter (Addendum)
 Called patient to schedule appts for the following reasons below:  ----- Message from [name] sent at 09/26/2023  9:41 PM EST ----- Please contact patient.  He absolutely must have a hospital discharge follow-up so that we can do a medication reconciliation, update his chart accordingly, and do a physical exam to evaluate his continuing needs.  Since his discharge, he has scheduled an appointment for 08/23/2023 and again on 09/19/2023.  The first appointment was a no-show and the second was canceled by the patient.  There is no appointment currently on the books for him to come see me.  In order for me to continue signing home health orders and prescribing medications, he will need to be seen in office. Thanks, Ander Slade

## 2023-09-28 ENCOUNTER — Telehealth: Payer: Self-pay

## 2023-09-28 NOTE — Telephone Encounter (Signed)
 Copied from CRM [id number]. Topic: Clinical - Home Health Verbal Orders >> Sep 27, 2023  2:52 PM Martha Clan wrote: Caller/Agency: [facility] Callback Number: [phone] Service Requested: Prescription for Santyl for wound care Frequency: Daily use for 6 weeks Any new concerns about the patient? Yes Sent to: [facility] on [address], [place], [place] [address]

## 2023-09-29 NOTE — Telephone Encounter (Signed)
 Pt called back. Pt called earlier this week and had scheduled visit for 10/12/2023 to see provider.  If sooner appointment is needed pt requesting callback to be squeezed in if possible.   Please assist pt further.

## 2023-09-29 NOTE — Telephone Encounter (Signed)
 Spoke with [facility] home [facility] - she will inform patient of need for visit.  I have  also attemtped to call patient. Left a voice mail message requesting a return call.

## 2023-09-30 DIAGNOSIS — T8781 Dehiscence of amputation stump: Secondary | ICD-10-CM | POA: Diagnosis not present

## 2023-09-30 DIAGNOSIS — L97518 Non-pressure chronic ulcer of other part of right foot with other specified severity: Secondary | ICD-10-CM | POA: Diagnosis not present

## 2023-09-30 DIAGNOSIS — E11621 Type 2 diabetes mellitus with foot ulcer: Secondary | ICD-10-CM | POA: Diagnosis not present

## 2023-09-30 DIAGNOSIS — G929 Unspecified toxic encephalopathy: Secondary | ICD-10-CM | POA: Diagnosis not present

## 2023-09-30 DIAGNOSIS — L89152 Pressure ulcer of sacral region, stage 2: Secondary | ICD-10-CM | POA: Diagnosis not present

## 2023-09-30 DIAGNOSIS — L89610 Pressure ulcer of right heel, unstageable: Secondary | ICD-10-CM | POA: Diagnosis not present

## 2023-09-30 DIAGNOSIS — I82441 Acute embolism and thrombosis of right tibial vein: Secondary | ICD-10-CM | POA: Diagnosis not present

## 2023-09-30 DIAGNOSIS — L97828 Non-pressure chronic ulcer of other part of left lower leg with other specified severity: Secondary | ICD-10-CM | POA: Diagnosis not present

## 2023-09-30 DIAGNOSIS — E11622 Type 2 diabetes mellitus with other skin ulcer: Secondary | ICD-10-CM | POA: Diagnosis not present

## 2023-10-02 DIAGNOSIS — I82441 Acute embolism and thrombosis of right tibial vein: Secondary | ICD-10-CM | POA: Diagnosis not present

## 2023-10-02 DIAGNOSIS — T8781 Dehiscence of amputation stump: Secondary | ICD-10-CM | POA: Diagnosis not present

## 2023-10-02 DIAGNOSIS — E11621 Type 2 diabetes mellitus with foot ulcer: Secondary | ICD-10-CM | POA: Diagnosis not present

## 2023-10-02 DIAGNOSIS — L97518 Non-pressure chronic ulcer of other part of right foot with other specified severity: Secondary | ICD-10-CM | POA: Diagnosis not present

## 2023-10-02 DIAGNOSIS — L89610 Pressure ulcer of right heel, unstageable: Secondary | ICD-10-CM | POA: Diagnosis not present

## 2023-10-02 DIAGNOSIS — G929 Unspecified toxic encephalopathy: Secondary | ICD-10-CM | POA: Diagnosis not present

## 2023-10-02 DIAGNOSIS — L89152 Pressure ulcer of sacral region, stage 2: Secondary | ICD-10-CM | POA: Diagnosis not present

## 2023-10-02 DIAGNOSIS — E11622 Type 2 diabetes mellitus with other skin ulcer: Secondary | ICD-10-CM | POA: Diagnosis not present

## 2023-10-02 DIAGNOSIS — L97828 Non-pressure chronic ulcer of other part of left lower leg with other specified severity: Secondary | ICD-10-CM | POA: Diagnosis not present

## 2023-10-03 ENCOUNTER — Encounter: Payer: Self-pay | Admitting: Medical-Surgical

## 2023-10-03 ENCOUNTER — Ambulatory Visit (INDEPENDENT_AMBULATORY_CARE_PROVIDER_SITE_OTHER): Payer: Medicare HMO | Admitting: Medical-Surgical

## 2023-10-03 VITALS — BP 119/69 | HR 76 | Resp 20 | Ht 69.0 in

## 2023-10-03 DIAGNOSIS — Z794 Long term (current) use of insulin: Secondary | ICD-10-CM | POA: Diagnosis not present

## 2023-10-03 DIAGNOSIS — Z5189 Encounter for other specified aftercare: Secondary | ICD-10-CM | POA: Diagnosis not present

## 2023-10-03 DIAGNOSIS — Z4889 Encounter for other specified surgical aftercare: Secondary | ICD-10-CM | POA: Diagnosis not present

## 2023-10-03 DIAGNOSIS — F1190 Opioid use, unspecified, uncomplicated: Secondary | ICD-10-CM | POA: Diagnosis not present

## 2023-10-03 DIAGNOSIS — E1140 Type 2 diabetes mellitus with diabetic neuropathy, unspecified: Secondary | ICD-10-CM | POA: Diagnosis not present

## 2023-10-03 LAB — POCT GLYCOSYLATED HEMOGLOBIN (HGB A1C)
HbA1c, POC (controlled diabetic range): 7.4 % — AB (ref 0.0–7.0)
Hemoglobin A1C: 7.4 % — AB (ref 4.0–5.6)

## 2023-10-03 MED ORDER — OXYCODONE HCL 10 MG PO TABS: 5.0000 mg | ORAL_TABLET | Freq: Four times a day (QID) | ORAL | 0 refills | Status: DC | PRN

## 2023-10-03 MED ORDER — PREGABALIN 50 MG PO CAPS: 50.0000 mg | ORAL_CAPSULE | Freq: Two times a day (BID) | ORAL | 1 refills | Status: DC

## 2023-10-03 NOTE — Progress Notes (Unsigned)
 Subjective:  Patient ID: Joseph Grimes, male    DOB: 10/02/1960, 63 y.o.   MRN: [id number]  Patient Care Team: [name], NP as PCP - General (Nurse Practitioner) Gabriel Carina, [facility] (Inactive) as Pharmacist (Pharmacist)   Chief Complaint:  Wound Check (RIGHT FOOT), Recent Left BKA, Medication refill and diabetes follow-up   HPI:  Joseph Grimes is a 63 y.o. male presenting on 10/03/2023 for Wound Check (RIGHT FOOT)  History, Exam,  Impression and Plan  1. Chronic, continuous use of opioids  Patient presents with complaint of 7/10 resting pain that is routinely 10/10 that patient takes in the 10 mg oxycodone for and reduces to 6/10.  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  Patient has been historically resistant to the addition of gabapentin or Lyrica, but consented to try Lyrica to help manage his pain.  Patient does not want referral to pain management clinic at this time.  Will start Lyrica and refill oxycodone to manage chronic neuropathic pain. - Oxycodone HCl 10 MG TABS; Take 0.5-1 tablets (5-10 mg total) by mouth every 6 (six) hours as needed. #40 tabs to last 90 days.  Dispense: 40 tablet; Refill: 0 -     pregabalin (LYRICA) 50 MG capsule; Take 1 capsule (50 mg total) by mouth 2 (two) times daily.  2. Visit for wound check Right lower extremity has numerous diabetic ulcers/wounds per patient.  Patient states home health is changing the dressing to his right lower extremity 3 times weekly.  Patient had several home health orders pending signature by provider involving home health dressing changes Santyl enzymatic debridement ointment refill to be provided by home health. Coban over gauze dressing extends from from mid calf to toes with a POTUS boot to protect foot  and dressing.  Dressing was not removed this visit because patient has dressing change appointment tomorrow.  Pt also states that dressing changes are painful. However dressing was reinforced because of gauze and elastic dressing unraveling over toes.     3. 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  Staples still in place stump flap is mildly red and edematous.  Mild scabbing remains over incision.  Patient scheduled with surgery to have staples removed but surgery never called to confirm appointment to have staples removed. Patient to call surgeon to reconfirm and coordinate and appointment to have staples removed.    Continue all other maintenance medications.  Follow up plan: Return in about 4 weeks (around 10/31/2023) for pain follow up. Patient to call surgeon to reconfirm and coordinate and appointment to have staples removed.   Relevant past medical, surgical, family, and social history reviewed and updated as indicated.  Allergies and medications reviewed and updated. Data reviewed: Chart in Epic.   Past Medical History:  Diagnosis Date   CKD (chronic kidney disease) stage 3, GFR 30-59 ml/min (HCC) 10/11/2018   Coronary artery disease 10/11/2018   Diabetes (HCC)    Diabetes mellitus type 2, controlled, with complications (HCC) 10/11/2018   Essential hypertension 10/11/2018   GERD (gastroesophageal reflux disease)    Heart attack (HCC) 2006   stent placed in right coronary artery   High cholesterol    History of anemia 10/11/2018   History of low potassium  Low hemoglobin    Mixed hyperlipidemia 10/11/2018   Peripheral vascular disease (HCC) 10/11/2018   Stroke (HCC)    Tobacco dependence 10/11/2018    Past Surgical History:  Procedure Laterality Date   AMPUTATION Left 07/19/2019   Procedure: LEFT PARTIAL THUMB AMPUTATION;  Surgeon: Tarry Kos, MD;  Location: [facility];  Service: Orthopedics;  Laterality: Left;  with digital block    CARDIAC CATHETERIZATION  2006   04/18/18: no stents, stent in '06   FEMORAL-POPLITEAL BYPASS GRAFT Right 05/12/2017   HAND SURGERY     KNEE ARTHROSCOPY     bilat   TOE AMPUTATION     x2   TONSILLECTOMY      Social History   Socioeconomic History   Marital status: Widowed    Spouse name: Not on file   Number of children: 7   Years of education: 14   Highest education level: Associate degree: academic program  Occupational History   Occupation: Retired; Disabled.  Tobacco Use   Smoking status: Every Day    Current packs/day: 0.50    Average packs/day: 0.5 packs/day for 53.0 years (26.5 ttl pk-yrs)    Types: Cigarettes   Smokeless tobacco: Never   Tobacco comments:    refused  Vaping Use   Vaping status: Never Used  Substance and Sexual Activity   Alcohol use: Not Currently   Drug use: Never   Sexual activity: Not Currently    Partners: Female  Other Topics Concern   Not on file  Social History Narrative   Lives with daughter and 2 grand children. Enjoys spending time with the grand children and watching TV.   Social Drivers of Corporate investment banker Strain: Low Risk  (06/12/2023)   Received from [facility]   Overall Financial Resource Strain (CARDIA)    Difficulty of Paying Living Expenses: Not very hard  Food Insecurity: No Food Insecurity (08/17/2023)   [facility] from [facility]   Hunger Vital Sign    Worried About Running Out of Food in the Last Year: Never true    Ran Out of Food in the Last Year: Never true  Transportation Needs: No Transportation Needs (08/18/2023)   Received from [facility] - Transportation    Lack of Transportation (Medical): No    Lack of Transportation (Non-Medical): No  Physical Activity: Inactive (05/18/2023)   Exercise Vital Sign    Days of Exercise per Week: 0 days    Minutes of Exercise per Session: 0 min  Stress: No Stress Concern Present (08/21/2023)   Received from [facility] - Occupational Stress Questionnaire    Feeling of Stress : Only a little  Social Connections: Socially Isolated (10/11/2022)   Social Connection and Isolation Panel [NHANES]    Frequency of Communication with Friends and Family: More than three times a week    Frequency of Social Gatherings with Friends and Family: More than three times a week    Attends Religious Services: Never    Database administrator or Organizations: No    Attends Banker Meetings: Never    Marital Status: Widowed  Intimate Partner Violence: Not At Risk (08/17/2023)   Received from [facility]   HITS    Over the last 12 months how often did your partner physically hurt you?: Never    Over the last 12 months how often did your partner insult you or talk down  to you?: Never    Over the last 12 months how often did your partner threaten you with physical harm?: Never    Over the last 12 months how often did your partner scream or curse at you?: Never    Outpatient Encounter Medications as of 10/03/2023  Medication Sig   Accu-Chek Softclix Lancets lancets    albuterol (VENTOLIN HFA) 108 (90 Base) MCG/ACT inhaler Inhale into the lungs every 6 (six) hours as needed for wheezing or shortness of breath.   aspirin 81 MG tablet Take 1 tablet (81 mg total) by mouth daily.   blood glucose meter kit and supplies Dispense based on patient and insurance preference. Use up to four times daily as directed. (FOR ICD-10 E10.9, E11.9).   Blood Glucose Monitoring Suppl DEVI 1 each by Does not apply route 2 (two) times daily as needed. May substitute to any manufacturer covered by patient's insurance.   cilostazol (PLETAL) 100 MG tablet TAKE 1 TABLET TWICE DAILY   dapagliflozin propanediol (FARXIGA) 10 MG TABS tablet Take 1 tablet (10 mg total) by mouth daily before breakfast.   Glucose Blood (BLOOD GLUCOSE TEST STRIPS) STRP 1 each by In Vitro route 2 (two) times daily. May substitute to any manufacturer  covered by patient's insurance.   Glucose Blood (BLOOD GLUCOSE TEST STRIPS) STRP 1 each by In Vitro route 2 (two) times daily as needed. May substitute to any manufacturer covered by patient's insurance.   insulin glargine, 2 Unit Dial, (TOUJEO MAX SOLOSTAR) 300 UNIT/ML Solostar Pen Inject 16 Units into the skin at bedtime. Increase by 4 units every 4 days until fasting (morning) blood sugar is <150, then stay at that dose. (Patient taking differently: Inject 35 Units into the skin at bedtime. Increase by 4 units every 4 days until fasting (morning) blood sugar is <150, then stay at that dose. (Taking 35 units daily as of 06/23/21))   Insulin Pen Needle (NOVOFINE) 30G X 8 MM MISC Used to inject Toujeo insulin once daily as prescribed.  Please dispense appropriate brand and size to use with Toujeo max Solostar insulin pen.   Lancet Device MISC 1 each by Does not apply route 2 (two) times daily. May substitute to any manufacturer covered by patient's insurance.   Lancet Device MISC 1 each by Does not apply route 2 (two) times daily as needed. May substitute to any manufacturer covered by patient's insurance.   Lancets Misc. MISC 1 each by Does not apply route 2 (two) times daily as needed. May substitute to any manufacturer covered by patient's insurance.   metoprolol succinate (TOPROL-XL) 25 MG 24 hr tablet Take 25 mg by mouth daily.   pantoprazole (PROTONIX) 40 MG tablet TAKE 1 TABLET EVERY DAY   pregabalin (LYRICA) 50 MG capsule Take 1 capsule (50 mg total) by mouth 2 (two) times daily.   rosuvastatin (CRESTOR) 40 MG tablet TAKE 1 TABLET EVERY DAY   torsemide (DEMADEX) 20 MG tablet Take 20 mg by mouth daily.   [DISCONTINUED] Oxycodone HCl 10 MG TABS Take 0.5-1 tablets (5-10 mg total) by mouth every 6 (six) hours as needed. #30 tabs to last 90 days. Next refill due 06/14/2023   Oxycodone HCl 10 MG TABS Take 0.5-1 tablets (5-10 mg total) by mouth every 6 (six) hours as needed. #40 tabs to last 90 days.    [DISCONTINUED] furosemide (LASIX) 20 MG tablet TAKE 1 TABLET EVERY DAY (Patient not taking: Reported on 10/03/2023)   No facility-administered encounter medications on file as  of 10/03/2023.    Allergies  Allergen Reactions   Ozempic (0.25 Or 0.5 Mg-Dose) [Semaglutide(0.25 Or 0.5mg -Dos)] Diarrhea    Review of Systems      Objective:  BP 119/69 (BP Location: Left Arm, Cuff Size: Normal)   Pulse 76   Resp 20   Ht 5\' 9"  (1.753 m)   SpO2 100%   BMI 22.32 kg/m    Wt Readings from Last 3 Encounters:  05/31/23 151 lb 1.9 oz (68.5 kg)  05/18/23 160 lb (72.6 kg)  03/16/23 145 lb 1.9 oz (65.8 kg)    Physical Exam  Results for orders placed or performed in visit on 05/31/23  CBC   Collection Time: 05/31/23 11:49 AM  Result Value Ref Range   WBC 10.8 3.4 - 10.8 x10E3/uL   RBC 2.66 (LL) 4.14 - 5.80 x10E6/uL   Hemoglobin 8.3 (L) 13.0 - 17.7 g/dL   Hematocrit 16.1 (L) 09.6 - 51.0 %   MCV 98 (H) 79 - 97 fL   MCH 31.2 26.6 - 33.0 pg   MCHC 31.9 31.5 - 35.7 g/dL   RDW 04.5 40.9 - 81.1 %   Platelets 436 150 - 450 x10E3/uL  CMP14+EGFR   Collection Time: 05/31/23 11:49 AM  Result Value Ref Range   Glucose 190 (H) 70 - 99 mg/dL   BUN 41 (H) 8 - 27 mg/dL   Creatinine, Ser 9.14 (H) 0.76 - 1.27 mg/dL   eGFR 36 (L) >78 GN/FAO/1.30   BUN/Creatinine Ratio 20 10 - 24   Sodium 139 134 - 144 mmol/L   Potassium 5.4 (H) 3.5 - 5.2 mmol/L   Chloride 104 96 - 106 mmol/L   CO2 22 20 - 29 mmol/L   Calcium 8.7 8.6 - 10.2 mg/dL   Total Protein 6.2 6.0 - 8.5 g/dL   Albumin 2.8 (L) 3.9 - 4.9 g/dL   Globulin, Total 3.4 1.5 - 4.5 g/dL   Bilirubin Total 0.3 0.0 - 1.2 mg/dL   Alkaline Phosphatase 306 (H) 44 - 121 IU/L   AST 23 0 - 40 IU/L   ALT 24 0 - 44 IU/L  B Nat Peptide   Collection Time: 05/31/23 11:49 AM  Result Value Ref Range   BNP 453.1 (H) 0.0 - 100.0 pg/mL       Pertinent labs & imaging results that were available during my care of the patient were reviewed by me and considered in  my medical decision making.   Continue healthy lifestyle choices, including diet (rich in fruits, vegetables, and lean proteins, and low in salt and simple carbohydrates) and exercise (at least 30 minutes of moderate physical activity daily).   The above assessment and management plan was discussed with the patient. The patient verbalized understanding of and has agreed to the management plan. Patient is aware to call the clinic if they develop any new symptoms or if symptoms persist or worsen. Patient is aware when to return to the clinic for a follow-up visit. Patient educated on when it is appropriate to go to the emergency department.   Maryelizabeth Kaufmann Student AGNP

## 2023-10-04 ENCOUNTER — Encounter: Payer: Self-pay | Admitting: Medical-Surgical

## 2023-10-04 DIAGNOSIS — E11621 Type 2 diabetes mellitus with foot ulcer: Secondary | ICD-10-CM | POA: Diagnosis not present

## 2023-10-04 DIAGNOSIS — T8781 Dehiscence of amputation stump: Secondary | ICD-10-CM | POA: Diagnosis not present

## 2023-10-04 DIAGNOSIS — G929 Unspecified toxic encephalopathy: Secondary | ICD-10-CM | POA: Diagnosis not present

## 2023-10-04 DIAGNOSIS — L89610 Pressure ulcer of right heel, unstageable: Secondary | ICD-10-CM | POA: Diagnosis not present

## 2023-10-04 DIAGNOSIS — E11622 Type 2 diabetes mellitus with other skin ulcer: Secondary | ICD-10-CM | POA: Diagnosis not present

## 2023-10-04 DIAGNOSIS — L97828 Non-pressure chronic ulcer of other part of left lower leg with other specified severity: Secondary | ICD-10-CM | POA: Diagnosis not present

## 2023-10-04 DIAGNOSIS — L97518 Non-pressure chronic ulcer of other part of right foot with other specified severity: Secondary | ICD-10-CM | POA: Diagnosis not present

## 2023-10-04 DIAGNOSIS — I82441 Acute embolism and thrombosis of right tibial vein: Secondary | ICD-10-CM | POA: Diagnosis not present

## 2023-10-04 DIAGNOSIS — L89152 Pressure ulcer of sacral region, stage 2: Secondary | ICD-10-CM | POA: Diagnosis not present

## 2023-10-04 NOTE — Progress Notes (Signed)
 Medical screening examination/treatment was performed by qualified clinical staff member and as supervising provider I was immediately available for consultation/collaboration. I have reviewed documentation and agree with assessment and plan.  Addendum:  Patient also presenting for hospital discharge follow-up after multiple treatments for lower extremity wounds, left BKA, and right foot wounds.  Was in the hospital for his surgeries but subsequently discharged to rehab.  Notes he went to 2 rehabs and has since been discharged home with his daughter who helps provide his daily care.  Review of notes completed today in addition to physical exam.  Medication reconciliation completed and wound care/home health orders signed and reviewed.  Thayer Ohm, DNP, APRN, FNP-BC [facility] and Sports Medicine

## 2023-10-05 DIAGNOSIS — E11621 Type 2 diabetes mellitus with foot ulcer: Secondary | ICD-10-CM | POA: Diagnosis not present

## 2023-10-05 DIAGNOSIS — L89152 Pressure ulcer of sacral region, stage 2: Secondary | ICD-10-CM | POA: Diagnosis not present

## 2023-10-05 DIAGNOSIS — E11622 Type 2 diabetes mellitus with other skin ulcer: Secondary | ICD-10-CM | POA: Diagnosis not present

## 2023-10-05 DIAGNOSIS — G929 Unspecified toxic encephalopathy: Secondary | ICD-10-CM | POA: Diagnosis not present

## 2023-10-05 DIAGNOSIS — L89610 Pressure ulcer of right heel, unstageable: Secondary | ICD-10-CM | POA: Diagnosis not present

## 2023-10-05 DIAGNOSIS — L97518 Non-pressure chronic ulcer of other part of right foot with other specified severity: Secondary | ICD-10-CM | POA: Diagnosis not present

## 2023-10-05 DIAGNOSIS — I82441 Acute embolism and thrombosis of right tibial vein: Secondary | ICD-10-CM | POA: Diagnosis not present

## 2023-10-05 DIAGNOSIS — T8781 Dehiscence of amputation stump: Secondary | ICD-10-CM | POA: Diagnosis not present

## 2023-10-05 DIAGNOSIS — L97828 Non-pressure chronic ulcer of other part of left lower leg with other specified severity: Secondary | ICD-10-CM | POA: Diagnosis not present

## 2023-10-07 DIAGNOSIS — L89152 Pressure ulcer of sacral region, stage 2: Secondary | ICD-10-CM | POA: Diagnosis not present

## 2023-10-07 DIAGNOSIS — L97518 Non-pressure chronic ulcer of other part of right foot with other specified severity: Secondary | ICD-10-CM | POA: Diagnosis not present

## 2023-10-07 DIAGNOSIS — L89610 Pressure ulcer of right heel, unstageable: Secondary | ICD-10-CM | POA: Diagnosis not present

## 2023-10-07 DIAGNOSIS — L97828 Non-pressure chronic ulcer of other part of left lower leg with other specified severity: Secondary | ICD-10-CM | POA: Diagnosis not present

## 2023-10-07 DIAGNOSIS — G929 Unspecified toxic encephalopathy: Secondary | ICD-10-CM | POA: Diagnosis not present

## 2023-10-07 DIAGNOSIS — E11622 Type 2 diabetes mellitus with other skin ulcer: Secondary | ICD-10-CM | POA: Diagnosis not present

## 2023-10-07 DIAGNOSIS — I82441 Acute embolism and thrombosis of right tibial vein: Secondary | ICD-10-CM | POA: Diagnosis not present

## 2023-10-07 DIAGNOSIS — T8781 Dehiscence of amputation stump: Secondary | ICD-10-CM | POA: Diagnosis not present

## 2023-10-07 DIAGNOSIS — E11621 Type 2 diabetes mellitus with foot ulcer: Secondary | ICD-10-CM | POA: Diagnosis not present

## 2023-10-09 ENCOUNTER — Other Ambulatory Visit: Payer: Self-pay | Admitting: Medical-Surgical

## 2023-10-09 ENCOUNTER — Ambulatory Visit: Payer: Self-pay | Admitting: Medical-Surgical

## 2023-10-09 DIAGNOSIS — T148XXD Other injury of unspecified body region, subsequent encounter: Secondary | ICD-10-CM

## 2023-10-09 DIAGNOSIS — T8781 Dehiscence of amputation stump: Secondary | ICD-10-CM | POA: Diagnosis not present

## 2023-10-09 DIAGNOSIS — G929 Unspecified toxic encephalopathy: Secondary | ICD-10-CM | POA: Diagnosis not present

## 2023-10-09 DIAGNOSIS — E11622 Type 2 diabetes mellitus with other skin ulcer: Secondary | ICD-10-CM | POA: Diagnosis not present

## 2023-10-09 DIAGNOSIS — L89610 Pressure ulcer of right heel, unstageable: Secondary | ICD-10-CM | POA: Diagnosis not present

## 2023-10-09 DIAGNOSIS — L97829 Non-pressure chronic ulcer of other part of left lower leg with unspecified severity: Secondary | ICD-10-CM

## 2023-10-09 DIAGNOSIS — Z89512 Acquired absence of left leg below knee: Secondary | ICD-10-CM

## 2023-10-09 DIAGNOSIS — I82441 Acute embolism and thrombosis of right tibial vein: Secondary | ICD-10-CM | POA: Diagnosis not present

## 2023-10-09 DIAGNOSIS — L89152 Pressure ulcer of sacral region, stage 2: Secondary | ICD-10-CM | POA: Diagnosis not present

## 2023-10-09 DIAGNOSIS — E11621 Type 2 diabetes mellitus with foot ulcer: Secondary | ICD-10-CM | POA: Diagnosis not present

## 2023-10-09 DIAGNOSIS — E08621 Diabetes mellitus due to underlying condition with foot ulcer: Secondary | ICD-10-CM

## 2023-10-09 DIAGNOSIS — L97518 Non-pressure chronic ulcer of other part of right foot with other specified severity: Secondary | ICD-10-CM | POA: Diagnosis not present

## 2023-10-09 DIAGNOSIS — L97828 Non-pressure chronic ulcer of other part of left lower leg with other specified severity: Secondary | ICD-10-CM | POA: Diagnosis not present

## 2023-10-09 NOTE — Telephone Encounter (Signed)
 Red Word that prompted transfer to Nurse Triage: Joseph Grimes with [facility] states that the patient has a wound with necrotic tissue and she is concerned about infection as he has been in the hospital previously for it.     Chief Complaint: Joseph Grimes with home health reports pt. Needs refill of Santyval sent to [facility] with authorization .This is being used fir wound care. Pt. Also still has staples in place from surgery to amputation. Asking for referral to wound care clinic as soon as possible. Pt. Has necrotic areas to wounds right heel, toe and left knee. Symptoms: Above Frequency: Several months Pertinent Negatives: Patient denies any pain Disposition: [] ED /[] Urgent Care (no appt availability in office) / [] Appointment(In office/virtual)/ []  [facility]/ [] Home Care/ [] Refused Recommended Disposition /[]  Mobile Bus/ [x]  Follow-up with PCP Additional Notes: Please advise.  Reason for Disposition  [1] Wound > 48 hours old AND [2] it becomes more tender  Answer Assessment - Initial Assessment Questions 1. LOCATION: "Where is the wound located?"      Heel , toe, knee 2. WOUND APPEARANCE: "What does the wound look like?"      Necrotic, black 3. SIZE: If redness is present, ask: "What is the size of the red area?" (Inches, centimeters, or compare to size of a coin)      None 4. SPREAD: "What's changed in the last day?"  "Do you see any red streaks coming from the wound?"     No 5. ONSET: "When did it start to look infected?"      3-4 months 6. MECHANISM: "How did the wound start, what was the cause?"     Unsure 7. PAIN: "Is there any pain?" If Yes, ask: "How bad is the pain?"   (Scale 1-10; or mild, moderate, severe)     No 8. FEVER: "Do you have a fever?" If Yes, ask: "What is your temperature, how was it measured, and when did it start?"     No 9. OTHER SYMPTOMS: "Do you have any other symptoms?" (e.g., shaking chills, weakness, rash elsewhere on  body)     No 10. PREGNANCY: "Is there any chance you are pregnant?" "When was your last menstrual period?"       N/a  Protocols used: Wound Infection-A-AH

## 2023-10-09 NOTE — Telephone Encounter (Signed)
 Copied from CRM [id number]. Topic: Clinical - Medication Refill >> Oct 09, 2023 11:16 AM Yvone Neu wrote: Most Recent Primary Care Visit:  Provider: [name]  Department: [facility]  Visit Type: OFFICE VISIT  Date: 10/03/2023  Medication: Atorvastatin   Has the patient contacted their pharmacy? Yes (Agent: If no, request that the patient contact the pharmacy for the refill. If patient does not wish to contact the pharmacy document the reason why and proceed with request.) (Agent: If yes, when and what did the pharmacy advise?)  Is this the correct pharmacy for this prescription? Yes If no, delete pharmacy and type the correct one.  This is the patient's preferred pharmacy:   [id number] - [place], [place] - [address] [facility] [address] [place] [address] Phone: [phone] Fax: [phone]   Has the prescription been filled recently? No  Is the patient out of the medication? Yes  Has the patient been seen for an appointment in the last year OR does the patient have an upcoming appointment? Yes  Can we respond through MyChart? Yes  Agent: Please be advised that Rx refills may take up to 3 business days. We ask that you follow-up with your pharmacy.

## 2023-10-10 DIAGNOSIS — E11622 Type 2 diabetes mellitus with other skin ulcer: Secondary | ICD-10-CM | POA: Diagnosis not present

## 2023-10-10 DIAGNOSIS — G929 Unspecified toxic encephalopathy: Secondary | ICD-10-CM | POA: Diagnosis not present

## 2023-10-10 DIAGNOSIS — I82441 Acute embolism and thrombosis of right tibial vein: Secondary | ICD-10-CM | POA: Diagnosis not present

## 2023-10-10 DIAGNOSIS — L89610 Pressure ulcer of right heel, unstageable: Secondary | ICD-10-CM | POA: Diagnosis not present

## 2023-10-10 DIAGNOSIS — L97518 Non-pressure chronic ulcer of other part of right foot with other specified severity: Secondary | ICD-10-CM | POA: Diagnosis not present

## 2023-10-10 DIAGNOSIS — T8781 Dehiscence of amputation stump: Secondary | ICD-10-CM | POA: Diagnosis not present

## 2023-10-10 DIAGNOSIS — E11621 Type 2 diabetes mellitus with foot ulcer: Secondary | ICD-10-CM | POA: Diagnosis not present

## 2023-10-10 DIAGNOSIS — L97828 Non-pressure chronic ulcer of other part of left lower leg with other specified severity: Secondary | ICD-10-CM | POA: Diagnosis not present

## 2023-10-10 DIAGNOSIS — L89152 Pressure ulcer of sacral region, stage 2: Secondary | ICD-10-CM | POA: Diagnosis not present

## 2023-10-11 MED ORDER — SANTYL 250 UNIT/GM EX OINT: 1.0000 | TOPICAL_OINTMENT | Freq: Every day | CUTANEOUS | 11 refills | Status: DC

## 2023-10-11 NOTE — Telephone Encounter (Signed)
 Just got this today. Santyl sent to the pharmacy as requested. Wound Clinic referral placed.   ___________________________________________ Thayer Ohm, DNP, APRN, FNP-BC Primary Care and Sports Medicine [facility] [place]

## 2023-10-11 NOTE — Telephone Encounter (Signed)
 Michelle with wound care informed. She is concerned that the Santyl would be too expensive -she does not think that it is being covered by patient's insurance. She will research this and will reach out to [place] if we can do anything further to asist.

## 2023-10-11 NOTE — Addendum Note (Signed)
 Addended byChristen Butter on: 10/11/2023 11:21 AM   Modules accepted: Orders

## 2023-10-12 ENCOUNTER — Ambulatory Visit (INDEPENDENT_AMBULATORY_CARE_PROVIDER_SITE_OTHER): Payer: Medicare HMO | Admitting: Medical-Surgical

## 2023-10-12 ENCOUNTER — Encounter: Payer: Self-pay | Admitting: Medical-Surgical

## 2023-10-12 VITALS — BP 111/62 | HR 61 | Resp 20 | Ht 69.0 in

## 2023-10-12 DIAGNOSIS — I5032 Chronic diastolic (congestive) heart failure: Secondary | ICD-10-CM | POA: Diagnosis not present

## 2023-10-12 DIAGNOSIS — G929 Unspecified toxic encephalopathy: Secondary | ICD-10-CM | POA: Diagnosis not present

## 2023-10-12 DIAGNOSIS — N184 Chronic kidney disease, stage 4 (severe): Secondary | ICD-10-CM

## 2023-10-12 DIAGNOSIS — J432 Centrilobular emphysema: Secondary | ICD-10-CM | POA: Diagnosis not present

## 2023-10-12 DIAGNOSIS — E11621 Type 2 diabetes mellitus with foot ulcer: Secondary | ICD-10-CM | POA: Diagnosis not present

## 2023-10-12 DIAGNOSIS — L97519 Non-pressure chronic ulcer of other part of right foot with unspecified severity: Secondary | ICD-10-CM

## 2023-10-12 DIAGNOSIS — E08621 Diabetes mellitus due to underlying condition with foot ulcer: Secondary | ICD-10-CM

## 2023-10-12 DIAGNOSIS — T148XXD Other injury of unspecified body region, subsequent encounter: Secondary | ICD-10-CM | POA: Diagnosis not present

## 2023-10-12 DIAGNOSIS — L89610 Pressure ulcer of right heel, unstageable: Secondary | ICD-10-CM | POA: Diagnosis not present

## 2023-10-12 DIAGNOSIS — L89152 Pressure ulcer of sacral region, stage 2: Secondary | ICD-10-CM | POA: Diagnosis not present

## 2023-10-12 DIAGNOSIS — T8781 Dehiscence of amputation stump: Secondary | ICD-10-CM | POA: Diagnosis not present

## 2023-10-12 DIAGNOSIS — L97518 Non-pressure chronic ulcer of other part of right foot with other specified severity: Secondary | ICD-10-CM | POA: Diagnosis not present

## 2023-10-12 DIAGNOSIS — I82441 Acute embolism and thrombosis of right tibial vein: Secondary | ICD-10-CM | POA: Diagnosis not present

## 2023-10-12 DIAGNOSIS — L97828 Non-pressure chronic ulcer of other part of left lower leg with other specified severity: Secondary | ICD-10-CM | POA: Diagnosis not present

## 2023-10-12 DIAGNOSIS — E11622 Type 2 diabetes mellitus with other skin ulcer: Secondary | ICD-10-CM | POA: Diagnosis not present

## 2023-10-12 NOTE — Progress Notes (Signed)
        Established patient visit  History, exam, impression, and plan:  1. Diabetic ulcer of other part of right foot associated with diabetes mellitus due to underlying condition, unspecified ulcer stage (HCC) (Primary) 2. 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 He has nurses coming out to his home three times weekly to help with wound assessment and dressing changes. Has been using Santyl on the right foot ulcers but reports that he ran out and doesn't have anymore. He has just been putting dry gauze on the wounds with Kerlix and an ACE wrap before applying his POTUS boot. Changing dressings daily. Has been referred to the wound clinic but they cannot see him until the end of this month. See below for clinical photos. Left knee with necrotic ulcer. L BKA stump with surgical staples still intact, one in the center dislodged with a small part of the scab pulled away. This staple was removed without difficulty. Patient reports no sensation in the BKA stump. Has not contacted his surgeon yet to get the staples out. Dressing removed today for evaluation of wounds then replaced. His nurse is coming out today and will do the full dressing change. Santyl has been sent to the pharmacy already.   3. Centrilobular emphysema (HCC) Long term smoker with no interest in quitting. Denies SOB today. Scattered wheezing noted to the bilateral lower lobes but otherwise clear to auscultation. Encouraged smoking cessation.   4. CKD (chronic kidney disease) stage 4, GFR 15-29 ml/min (HCC) Most recent renal function labs showed improvement of GFR to 39 with creatinine of 1.90. No signs of fluid volume overload or uremia. Voiding without difficulty. Recommend avoidance of NSAIDs. Stay well hydrated. Plan to monitor closely.   5. Chronic diastolic congestive heart failure (HCC) History of chronic CHF.  No  signs of fluid volume overload or respiratory distress today.  Echocardiogram completed November 2024 showing mildly abnormal systolic function with an ejection fraction of 45-50% and mild hypokinesis of the left ventricle.  Systolic murmur noted with normal S2.  Heart rate regular.  Continue to monitor.  Left knee and BKA stump    Right foot ulcers       Procedures performed this visit: None.  Return for Follow-up as scheduled on 10/31/2023.  __________________________________ Joseph Ohm, DNP, APRN, FNP-BC Primary Care and Sports Medicine [facility] [place]

## 2023-10-13 ENCOUNTER — Encounter: Payer: Self-pay | Admitting: Medical-Surgical

## 2023-10-13 DIAGNOSIS — L97828 Non-pressure chronic ulcer of other part of left lower leg with other specified severity: Secondary | ICD-10-CM | POA: Diagnosis not present

## 2023-10-13 DIAGNOSIS — G929 Unspecified toxic encephalopathy: Secondary | ICD-10-CM | POA: Diagnosis not present

## 2023-10-13 DIAGNOSIS — E11622 Type 2 diabetes mellitus with other skin ulcer: Secondary | ICD-10-CM | POA: Diagnosis not present

## 2023-10-13 DIAGNOSIS — J432 Centrilobular emphysema: Secondary | ICD-10-CM | POA: Insufficient documentation

## 2023-10-13 DIAGNOSIS — I82441 Acute embolism and thrombosis of right tibial vein: Secondary | ICD-10-CM | POA: Diagnosis not present

## 2023-10-13 DIAGNOSIS — L89152 Pressure ulcer of sacral region, stage 2: Secondary | ICD-10-CM | POA: Diagnosis not present

## 2023-10-13 DIAGNOSIS — E11621 Type 2 diabetes mellitus with foot ulcer: Secondary | ICD-10-CM | POA: Diagnosis not present

## 2023-10-13 DIAGNOSIS — T8781 Dehiscence of amputation stump: Secondary | ICD-10-CM | POA: Diagnosis not present

## 2023-10-13 DIAGNOSIS — L89610 Pressure ulcer of right heel, unstageable: Secondary | ICD-10-CM | POA: Diagnosis not present

## 2023-10-13 DIAGNOSIS — L97518 Non-pressure chronic ulcer of other part of right foot with other specified severity: Secondary | ICD-10-CM | POA: Diagnosis not present

## 2023-10-16 ENCOUNTER — Telehealth: Payer: Self-pay

## 2023-10-16 ENCOUNTER — Ambulatory Visit: Payer: Medicare HMO

## 2023-10-16 VITALS — Ht 69.0 in | Wt 143.0 lb

## 2023-10-16 DIAGNOSIS — Z4800 Encounter for change or removal of nonsurgical wound dressing: Secondary | ICD-10-CM | POA: Diagnosis not present

## 2023-10-16 DIAGNOSIS — Z794 Long term (current) use of insulin: Secondary | ICD-10-CM | POA: Diagnosis not present

## 2023-10-16 DIAGNOSIS — I739 Peripheral vascular disease, unspecified: Secondary | ICD-10-CM | POA: Diagnosis not present

## 2023-10-16 DIAGNOSIS — Z0000 Encounter for general adult medical examination without abnormal findings: Secondary | ICD-10-CM

## 2023-10-16 DIAGNOSIS — I2510 Atherosclerotic heart disease of native coronary artery without angina pectoris: Secondary | ICD-10-CM | POA: Diagnosis not present

## 2023-10-16 DIAGNOSIS — L89152 Pressure ulcer of sacral region, stage 2: Secondary | ICD-10-CM | POA: Diagnosis not present

## 2023-10-16 DIAGNOSIS — L97828 Non-pressure chronic ulcer of other part of left lower leg with other specified severity: Secondary | ICD-10-CM | POA: Diagnosis not present

## 2023-10-16 DIAGNOSIS — E11621 Type 2 diabetes mellitus with foot ulcer: Secondary | ICD-10-CM | POA: Diagnosis not present

## 2023-10-16 DIAGNOSIS — I82441 Acute embolism and thrombosis of right tibial vein: Secondary | ICD-10-CM | POA: Diagnosis not present

## 2023-10-16 DIAGNOSIS — I252 Old myocardial infarction: Secondary | ICD-10-CM | POA: Diagnosis not present

## 2023-10-16 DIAGNOSIS — M7989 Other specified soft tissue disorders: Secondary | ICD-10-CM | POA: Diagnosis not present

## 2023-10-16 DIAGNOSIS — E119 Type 2 diabetes mellitus without complications: Secondary | ICD-10-CM | POA: Diagnosis not present

## 2023-10-16 DIAGNOSIS — F17210 Nicotine dependence, cigarettes, uncomplicated: Secondary | ICD-10-CM | POA: Diagnosis not present

## 2023-10-16 DIAGNOSIS — E11622 Type 2 diabetes mellitus with other skin ulcer: Secondary | ICD-10-CM | POA: Diagnosis not present

## 2023-10-16 DIAGNOSIS — G929 Unspecified toxic encephalopathy: Secondary | ICD-10-CM | POA: Diagnosis not present

## 2023-10-16 DIAGNOSIS — T8781 Dehiscence of amputation stump: Secondary | ICD-10-CM | POA: Diagnosis not present

## 2023-10-16 DIAGNOSIS — L97518 Non-pressure chronic ulcer of other part of right foot with other specified severity: Secondary | ICD-10-CM | POA: Diagnosis not present

## 2023-10-16 DIAGNOSIS — Z7982 Long term (current) use of aspirin: Secondary | ICD-10-CM | POA: Diagnosis not present

## 2023-10-16 DIAGNOSIS — L89610 Pressure ulcer of right heel, unstageable: Secondary | ICD-10-CM | POA: Diagnosis not present

## 2023-10-16 DIAGNOSIS — Z89421 Acquired absence of other right toe(s): Secondary | ICD-10-CM | POA: Diagnosis not present

## 2023-10-16 DIAGNOSIS — G40909 Epilepsy, unspecified, not intractable, without status epilepticus: Secondary | ICD-10-CM

## 2023-10-16 DIAGNOSIS — Z8673 Personal history of transient ischemic attack (TIA), and cerebral infarction without residual deficits: Secondary | ICD-10-CM | POA: Diagnosis not present

## 2023-10-16 NOTE — Progress Notes (Signed)
 Subjective:   Joseph Grimes is a 63 y.o. male who presents for Medicare Annual/Subsequent preventive examination.  Visit Complete: Virtual I connected with  Joseph Grimes on 10/16/23 by a audio enabled telemedicine application and verified that I am speaking with the correct person using two identifiers.  Patient Location: Home  Provider Location: Office/Clinic  I discussed the limitations of evaluation and management by telemedicine. The patient expressed understanding and agreed to proceed.  Vital Signs: Because this visit was a virtual/telehealth visit, some criteria may be missing or patient reported. Any vitals not documented were not able to be obtained and vitals that have been documented are patient reported.  Patient Medicare AWV questionnaire was completed by the patient on 08/17/2023; I have confirmed that all information answered by patient is correct and no changes since this date.  Cardiac Risk Factors include: advanced age (>19men, >56 women);male gender;hypertension;dyslipidemia;diabetes mellitus;sedentary lifestyle;smoking/ tobacco exposure;family history of premature cardiovascular disease     Objective:    Today's Vitals   10/16/23 1047 10/16/23 1048  Weight: 143 lb (64.9 kg)   Height: 5\' 9"  (1.753 m)   PainSc:  6    Body mass index is 21.12 kg/m.     10/16/2023   11:07 AM 10/11/2022   11:35 AM 03/10/2022   10:55 AM 10/06/2021   10:39 AM 07/30/2020   10:12 AM 07/23/2020   10:20 AM 07/19/2019   12:14 PM  Advanced Directives  Does Patient Have a Medical Advance Directive? Yes No No No No No No  Does patient want to make changes to medical advance directive? Yes (MAU/Ambulatory/Procedural Areas - Information given)      No - Patient declined  Would patient like information on creating a medical advance directive?  No - Patient declined No - Patient declined No - Patient declined No - Patient declined No - Patient declined No - Patient declined    Current  Medications (verified) Outpatient Encounter Medications as of 10/16/2023  Medication Sig   Accu-Chek Softclix Lancets lancets    albuterol (VENTOLIN HFA) 108 (90 Base) MCG/ACT inhaler Inhale into the lungs every 6 (six) hours as needed for wheezing or shortness of breath.   aspirin 81 MG tablet Take 1 tablet (81 mg total) by mouth daily.   blood glucose meter kit and supplies Dispense based on patient and insurance preference. Use up to four times daily as directed. (FOR ICD-10 E10.9, E11.9).   Blood Glucose Monitoring Suppl DEVI 1 each by Does not apply route 2 (two) times daily as needed. May substitute to any manufacturer covered by patient's insurance.   cilostazol (PLETAL) 100 MG tablet TAKE 1 TABLET TWICE DAILY   collagenase (SANTYL) 250 UNIT/GM ointment Apply 1 Application topically daily.   divalproex (DEPAKOTE ER) 500 MG 24 hr tablet Take 500 mg by mouth daily.   Glucose Blood (BLOOD GLUCOSE TEST STRIPS) STRP 1 each by In Vitro route 2 (two) times daily. May substitute to any manufacturer covered by patient's insurance.   Glucose Blood (BLOOD GLUCOSE TEST STRIPS) STRP 1 each by In Vitro route 2 (two) times daily as needed. May substitute to any manufacturer covered by patient's insurance.   insulin glargine, 2 Unit Dial, (TOUJEO MAX SOLOSTAR) 300 UNIT/ML Solostar Pen Inject 16 Units into the skin at bedtime. Increase by 4 units every 4 days until fasting (morning) blood sugar is <150, then stay at that dose. (Patient taking differently: Inject 35 Units into the skin at bedtime. Increase by 4 units  every 4 days until fasting (morning) blood sugar is <150, then stay at that dose. (Taking 35 units daily as of 06/23/21))   Insulin Pen Needle (NOVOFINE) 30G X 8 MM MISC Used to inject Toujeo insulin once daily as prescribed.  Please dispense appropriate brand and size to use with Toujeo max Solostar insulin pen.   Lancet Device MISC 1 each by Does not apply route 2 (two) times daily. May substitute  to any manufacturer covered by patient's insurance.   Oxycodone HCl 10 MG TABS Take 0.5-1 tablets (5-10 mg total) by mouth every 6 (six) hours as needed. #40 tabs to last 90 days.   pantoprazole (PROTONIX) 40 MG tablet TAKE 1 TABLET EVERY DAY   pregabalin (LYRICA) 50 MG capsule Take 1 capsule (50 mg total) by mouth 2 (two) times daily.   rosuvastatin (CRESTOR) 40 MG tablet TAKE 1 TABLET EVERY DAY   torsemide (DEMADEX) 20 MG tablet Take 20 mg by mouth daily.   dapagliflozin propanediol (FARXIGA) 10 MG TABS tablet Take 1 tablet (10 mg total) by mouth daily before breakfast. (Patient not taking: Reported on 10/16/2023)   metoprolol succinate (TOPROL-XL) 25 MG 24 hr tablet Take 25 mg by mouth daily. (Patient not taking: Reported on 10/16/2023)   [DISCONTINUED] Lancet Device MISC 1 each by Does not apply route 2 (two) times daily as needed. May substitute to any manufacturer covered by patient's insurance.   [DISCONTINUED] Lancets Misc. MISC 1 each by Does not apply route 2 (two) times daily as needed. May substitute to any manufacturer covered by patient's insurance.   No facility-administered encounter medications on file as of 10/16/2023.    Allergies (verified) Ozempic (0.25 or 0.5 mg-dose) [semaglutide(0.25 or 0.5mg -dos)]   History: Past Medical History:  Diagnosis Date   CKD (chronic kidney disease) stage 3, GFR 30-59 ml/min (HCC) 10/11/2018   Coronary artery disease 10/11/2018   Diabetes (HCC)    Diabetes mellitus type 2, controlled, with complications (HCC) 10/11/2018   Essential hypertension 10/11/2018   GERD (gastroesophageal reflux disease)    Heart attack (HCC) 2006   stent placed in right coronary artery   High cholesterol    History of anemia 10/11/2018   History of low potassium    Low hemoglobin    Mixed hyperlipidemia 10/11/2018   Peripheral vascular disease (HCC) 10/11/2018   Stroke (HCC)    Tobacco dependence 10/11/2018   Past Surgical History:  Procedure Laterality Date    AMPUTATION Left 07/19/2019   Procedure: LEFT PARTIAL THUMB AMPUTATION;  Surgeon: Tarry Kos, MD;  Location: [facility];  Service: Orthopedics;  Laterality: Left;  with digital block   CARDIAC CATHETERIZATION  2006   04/18/18: no stents, stent in '06   FEMORAL-POPLITEAL BYPASS GRAFT Right 05/12/2017   HAND SURGERY     KNEE ARTHROSCOPY     bilat   TOE AMPUTATION     x2   TONSILLECTOMY     Family History  Problem Relation Age of Onset   Diabetes Sister    Diabetes Brother    Heart disease Mother    Heart attack Father    Diabetes Father    Diabetes Sister    Social History   Socioeconomic History   Marital status: Widowed    Spouse name: Not on file   Number of children: 7   Years of education: 14   Highest education level: Associate degree: academic program  Occupational History   Occupation: Retired; Disabled.  Tobacco Use  Smoking status: Every Day    Current packs/day: 0.50    Average packs/day: 0.5 packs/day for 53.8 years (26.9 ttl pk-yrs)    Types: Cigarettes    Start date: 12/11/1969   Smokeless tobacco: Never   Tobacco comments:    refused  Vaping Use   Vaping status: Never Used  Substance and Sexual Activity   Alcohol use: Not Currently   Drug use: Never   Sexual activity: Not Currently    Partners: Female  Other Topics Concern   Not on file  Social History Narrative   Lives with daughter and 2 grand children. Enjoys spending time with the grand children and watching TV.   Social Drivers of Corporate investment banker Strain: Low Risk  (10/16/2023)   Overall Financial Resource Strain (CARDIA)    Difficulty of Paying Living Expenses: Not hard at all  Food Insecurity: No Food Insecurity (10/16/2023)   Hunger Vital Sign    Worried About Running Out of Food in the Last Year: Never true    Ran Out of Food in the Last Year: Never true  Transportation Needs: No Transportation Needs (10/16/2023)   PRAPARE - Scientist, research (physical sciences) (Medical): No    Lack of Transportation (Non-Medical): No  Physical Activity: Sufficiently Active (10/16/2023)   Exercise Vital Sign    Days of Exercise per Week: 5 days    Minutes of Exercise per Session: 30 min  Stress: No Stress Concern Present (10/16/2023)   [employer] of Occupational Health - Occupational Stress Questionnaire    Feeling of Stress : Not at all  Social Connections: Socially Isolated (10/16/2023)   Social Connection and Isolation Panel [NHANES]    Frequency of Communication with Friends and Family: More than three times a week    Frequency of Social Gatherings with Friends and Family: More than three times a week    Attends Religious Services: Never    Database administrator or Organizations: No    Attends Banker Meetings: Never    Marital Status: Widowed    Tobacco Counseling Ready to quit: Not Answered Counseling given: Not Answered Tobacco comments: refused   Clinical Intake:  Pre-visit preparation completed: Yes  Pain : 0-10 Pain Score: 6  Pain Type: Chronic pain Pain Location: Back Pain Orientation: Lower Pain Radiating Towards: legs Pain Descriptors / Indicators: Constant Pain Onset: More than a month ago Pain Frequency: Constant Pain Relieving Factors: Pain medication. Effect of Pain on Daily Activities: yes  Pain Relieving Factors: Pain medication.  BMI - recorded: 21.12 Nutritional Status: BMI of 19-24  Normal Nutritional Risks: None Diabetes: Yes CBG done?: Yes (109 mg/dl) Did pt. bring in CBG monitor from home?: No  How often do you need to have someone help you when you read instructions, pamphlets, or other written materials from your doctor or pharmacy?: 1 - Never What is the last grade level you completed in school?: 14  Interpreter Needed?: No      Activities of Daily Living    10/16/2023   10:52 AM  In your present state of health, do you have any difficulty performing the following  activities:  Hearing? 0  Vision? 0  Difficulty concentrating or making decisions? 0  Walking or climbing stairs? 1  Dressing or bathing? 0  Doing errands, shopping? 1  Preparing Food and eating ? Y  Using the Toilet? N  In the past six months, have you accidently leaked urine? N  Do  you have problems with loss of bowel control? N  Managing your Medications? N  Managing your Finances? N  Housekeeping or managing your Housekeeping? Y    Patient Care Team: [name], NP as PCP - General (Nurse Practitioner) Gabriel Carina, [facility] (Inactive) as Pharmacist (Pharmacist) Burt Knack, DPM (Podiatry) Lattie Corns, MD as Referring Physician (Surgery) Charlotte Sanes, MD (Inactive) as Consulting Physician (Pulmonary Disease)  Indicate any recent Medical Services you may have received from other than [facility] providers in the past year (date may be approximate).     Assessment:   This is a routine wellness examination for Joseph Grimes.  Hearing/Vision screen No results found.   Goals Addressed             This Visit's Progress    Activity and Exercise Increased       He would like to walk again and be more active.       Depression Screen    10/16/2023   11:06 AM 03/16/2023    9:51 AM 03/16/2023    9:49 AM 12/14/2022   11:00 AM 10/11/2022   11:36 AM 09/15/2022    9:59 AM 06/03/2022   10:41 AM  PHQ 2/9 Scores  PHQ - 2 Score 0 0 0 0 0 0 0    Fall Risk    10/16/2023   11:07 AM 03/16/2023    9:49 AM 12/14/2022   11:00 AM 10/11/2022   11:36 AM 09/15/2022    9:59 AM  Fall Risk   Falls in the past year? 1 0 0 0 0  Number falls in past yr: 1 0 0 0 0  Injury with Fall? 1 0 0 0 0  Risk for fall due to : Impaired mobility;History of fall(s);Impaired balance/gait No Fall Risks No Fall Risks No Fall Risks No Fall Risks  Follow up Falls evaluation completed Falls evaluation completed Falls evaluation completed Falls evaluation completed Falls evaluation completed    MEDICARE RISK AT  HOME: Medicare Risk at Home Any stairs in or around the home?: Yes If so, are there any without handrails?: Yes Home free of loose throw rugs in walkways, pet beds, electrical cords, etc?: Yes Adequate lighting in your home to reduce risk of falls?: Yes Life alert?: No Use of a cane, walker or w/c?: Yes Grab bars in the bathroom?: No Shower chair or bench in shower?: Yes Elevated toilet seat or a handicapped toilet?: No  TIMED UP AND GO:  Was the test performed?  No    Cognitive Function:        10/16/2023   11:09 AM 10/11/2022   11:42 AM 10/06/2021   10:42 AM 07/23/2020   10:22 AM  6CIT Screen  What Year?  0 points 0 points 0 points  What month?  0 points 0 points 0 points  What time? 0 points 0 points 0 points 0 points  Count back from 20 0 points 0 points 0 points 0 points  Months in reverse 0 points 0 points 0 points 0 points  Repeat phrase 0 points 0 points 0 points 0 points  Total Score  0 points 0 points 0 points    Immunizations Immunization History  Administered Date(s) Administered   Janssen (J&J) SARS-COV-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 12/04/2019   Moderna Sars-Covid-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 11/16/2021   PFIZER(Purple Top)SARS-COV-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 06/10/2020   PPD Test 07/03/2023    TDAP status: Due, Education has been provided regarding the importance of this vaccine. Advised may receive this vaccine at local pharmacy  or Health Dept. Aware to provide a copy of the vaccination record if obtained from local pharmacy or Health Dept. Verbalized acceptance and understanding.  Flu Vaccine status: Due, Education has been provided regarding the importance of this vaccine. Advised may receive this vaccine at local pharmacy or Health Dept. Aware to provide a copy of the vaccination record if obtained from local pharmacy or Health Dept. Verbalized acceptance and understanding.  Pneumococcal vaccine status: Due, Education has been provided regarding the importance of this vaccine. Advised may receive  this vaccine at local pharmacy or Health Dept. Aware to provide a copy of the vaccination record if obtained from local pharmacy or Health Dept. Verbalized acceptance and understanding.  Covid-19 vaccine status: Completed vaccines  Qualifies for Shingles Vaccine? Yes   Zostavax completed No   Shingrix Completed?: No.    Education has been provided regarding the importance of this vaccine. Patient has been advised to call insurance company to determine out of pocket expense if they have not yet received this vaccine. Advised may also receive vaccine at local pharmacy or Health Dept. Verbalized acceptance and understanding.  Screening Tests Health Maintenance  Topic Date Due   Pneumococcal Vaccine 22-98 Years old (1 of 2 - PCV) Never done   FOOT EXAM  10/14/2023   COVID-19 Vaccine (4 - 2024-25 season) 10/19/2023 (Originally 04/09/2023)   INFLUENZA VACCINE  11/06/2023 (Originally 03/09/2023)   Lung Cancer Screening  12/14/2023 (Originally 07/28/2009)   Zoster Vaccines- Shingrix (1 of 2) 01/12/2024 (Originally 07/28/1978)   DTaP/Tdap/Td (1 - Tdap) 05/30/2024 (Originally 07/28/1978)   Hepatitis C Screening  10/12/2024 (Originally 07/28/1977)   HIV Screening  10/12/2024 (Originally 07/28/1974)   OPHTHALMOLOGY EXAM  12/28/2023   Diabetic kidney evaluation - Urine ACR  03/15/2024   HEMOGLOBIN A1C  04/01/2024   Diabetic kidney evaluation - eGFR measurement  05/30/2024   Medicare Annual Wellness (AWV)  10/15/2024   Colonoscopy  07/19/2027   HPV VACCINES  Aged Out    Health Maintenance  Health Maintenance Due  Topic Date Due   Pneumococcal Vaccine 73-74 Years old (1 of 2 - PCV) Never done   FOOT EXAM  10/14/2023    Colorectal cancer screening: Type of screening: Colonoscopy. Completed 07/18/2017. Repeat every 10 years  Lung Cancer Screening: (Low Dose CT Chest recommended if Age 30-80 years, 20 pack-year currently smoking OR have quit w/in 15years.) does qualify.   Lung Cancer Screening  Referral: patient doesn't want the lung cancer screening  Additional Screening:  Hepatitis C Screening: does qualify; Completed patient has not had hep C screening  Vision Screening: Recommended annual ophthalmology exams for early detection of glaucoma and other disorders of the eye. Is the patient up to date with their annual eye exam?  Yes  Who is the provider or what is the name of the office in which the patient attends annual eye exams? [facility] If pt is not established with a provider, would they like to be referred to a provider to establish care? No .   Dental Screening: Recommended annual dental exams for proper oral hygiene  Diabetic Foot Exam: Diabetic Foot Exam: Completed 10/14/2022  He had an appointment with podiatry on 09/05/2023.  Community Resource Referral / Chronic Care Management: CRR required this visit?  No   CCM required this visit?  No     Plan:     I have personally reviewed and noted the following in the patient's chart:   Medical and social history Use of alcohol, tobacco  or illicit drugs  Current medications and supplements including opioid prescriptions. Patient is currently taking opioid prescriptions. Information provided to patient regarding non-opioid alternatives. Patient advised to discuss non-opioid treatment plan with their provider. Functional ability and status Nutritional status Physical activity Advanced directives List of other physicians Hospitalizations, surgeries, and ER visits in previous 12 months Vitals Screenings to include cognitive, depression, and falls Referrals and appointments  In addition, I have reviewed and discussed with patient certain preventive protocols, quality metrics, and best practice recommendations. A written personalized care plan for preventive services as well as general preventive health recommendations were provided to patient.     Esmond Harps, CMA   10/16/2023   After Visit Summary:  (Mail) Due to this being a telephonic visit, the after visit summary with patients personalized plan was offered to patient via mail   Nurse Notes:    Joseph Grimes is a 63 y.o. male patient of [name], NP who had a Medicare Annual Wellness Visit today via telephone. Joseph Grimes is Disabled and lives with their daughter. he has 7 children. He reports that he is socially active and does interact with friends/family regularly. He is minimally physically active and enjoys spending time with grandchildren and watching television. He does go around the house in his wheelchair for exercise.

## 2023-10-16 NOTE — Telephone Encounter (Signed)
 Joseph Grimes wanted to know if he should be taking both the divalproex XR 500 mg and the levetiracetam 500 mg?  He states he received the levetiracetam at the hospital.

## 2023-10-16 NOTE — Telephone Encounter (Signed)
 Continue both medications. Referral to Neurology placed.   ___________________________________________ Thayer Ohm, DNP, APRN, FNP-BC Primary Care and Sports Medicine [facility] [place]

## 2023-10-16 NOTE — Patient Instructions (Signed)
 Mr. Lober , Thank you for taking time to come for your Medicare Wellness Visit. I appreciate your ongoing commitment to your health goals. Please review the following plan we discussed and let me know if I can assist you in the future.   These are the goals we discussed:  Goals       Activity and Exercise Increased      He would like to walk again and be more active.       Medication Management      Patient Goals/Self-Care Activities Over the next 30 days, patient will:   take medications as prescribed, check glucose 2x day (morning and afternoon), document, and provide at future appointments, collaborate with provider on medication access solutions, and engage in dietary modifications by choosing a green vegetable at least 3x per week and aim to decrease to 1-2 Mt Dew per day at most  Follow Up Plan: Telephone follow up appointment with care management team member scheduled for:  1 month      Patient Stated      07/23/2020 AWV Goal: Diabetes Management  Patient will maintain an A1C level below 8.0 Patient will not develop any diabetic foot complications Patient will not experience any hypoglycemic episodes over the next 3 months Patient will notify our office of any CBG readings outside of the provider recommended range by calling [phone] Patient will adhere to provider recommendations for diabetes management  Patient Self Management Activities take all medications as prescribed and report any negative side effects monitor and record blood sugar readings as directed adhere to a low carbohydrate diet that incorporates lean proteins, vegetables, whole grains, low glycemic fruits check feet daily noting any sores, cracks, injuries, or callous formations see PCP or podiatrist if he notices any changes in his legs, feet, or toenails Patient will visit PCP and have an A1C level checked every 3 to 6 months as directed  have a yearly eye exam to monitor for vascular changes  associated with diabetes and will request that the report be sent to his pcp.  consult with his PCP regarding any changes in his health or new or worsening symptoms       Patient Stated (pt-stated)      10/06/2021 AWV Goal: Diabetes Management  Patient will maintain an A1C level below 7.0 Patient will not develop any diabetic foot complications Patient will not experience any hypoglycemic episodes over the next 3 months Patient will notify our office of any CBG readings outside of the provider recommended range by calling [phone] Patient will adhere to provider recommendations for diabetes management  Patient Self Management Activities take all medications as prescribed and report any negative side effects monitor and record blood sugar readings as directed adhere to a low carbohydrate diet that incorporates lean proteins, vegetables, whole grains, low glycemic fruits check feet daily noting any sores, cracks, injuries, or callous formations see PCP or podiatrist if he notices any changes in his legs, feet, or toenails Patient will visit PCP and have an A1C level checked every 3 to 6 months as directed  have a yearly eye exam to monitor for vascular changes associated with diabetes and will request that the report be sent to his pcp.  consult with his PCP regarding any changes in his health or new or worsening symptoms       Patient Stated (pt-stated)      10/11/2022 AWV Goal: Diabetes Management  Patient will maintain an A1C level below 8.0 Patient will not  develop any diabetic foot complications Patient will not experience any hypoglycemic episodes over the next 3 months Patient will notify our office of any CBG readings outside of the provider recommended range by calling [phone] Patient will adhere to provider recommendations for diabetes management  Patient Self Management Activities take all medications as prescribed and report any negative side effects monitor and  record blood sugar readings as directed adhere to a low carbohydrate diet that incorporates lean proteins, vegetables, whole grains, low glycemic fruits check feet daily noting any sores, cracks, injuries, or callous formations see PCP or podiatrist if he notices any changes in his legs, feet, or toenails Patient will visit PCP and have an A1C level checked every 3 to 6 months as directed  have a yearly eye exam to monitor for vascular changes associated with diabetes and will request that the report be sent to his pcp.  consult with his PCP regarding any changes in his health or new or worsening symptoms         This is a list of the screening recommended for you and due dates:  Health Maintenance  Topic Date Due   Pneumococcal Vaccination (1 of 2 - PCV) Never done   Complete foot exam   10/14/2023   COVID-19 Vaccine (4 - 2024-25 season) 10/19/2023*   Flu Shot  11/06/2023*   Screening for Lung Cancer  12/14/2023*   Zoster (Shingles) Vaccine (1 of 2) 01/12/2024*   DTaP/Tdap/Td vaccine (1 - Tdap) 05/30/2024*   Hepatitis C Screening  10/12/2024*   HIV Screening  10/12/2024*   Eye exam for diabetics  12/28/2023   Yearly kidney health urinalysis for diabetes  03/15/2024   Hemoglobin A1C  04/01/2024   Yearly kidney function blood test for diabetes  05/30/2024   Medicare Annual Wellness Visit  10/15/2024   Colon Cancer Screening  07/19/2027   HPV Vaccine  Aged Out  *Topic was postponed. The date shown is not the original due date.

## 2023-10-16 NOTE — Telephone Encounter (Signed)
 Patient advised of to take both of the medications. He states he doesn't have a neurologist. The seizure happened while he was in the hospital.

## 2023-10-17 DIAGNOSIS — T879 Unspecified complications of amputation stump: Secondary | ICD-10-CM | POA: Diagnosis not present

## 2023-10-18 DIAGNOSIS — L89152 Pressure ulcer of sacral region, stage 2: Secondary | ICD-10-CM | POA: Diagnosis not present

## 2023-10-18 DIAGNOSIS — G929 Unspecified toxic encephalopathy: Secondary | ICD-10-CM | POA: Diagnosis not present

## 2023-10-18 DIAGNOSIS — L89610 Pressure ulcer of right heel, unstageable: Secondary | ICD-10-CM | POA: Diagnosis not present

## 2023-10-18 DIAGNOSIS — L97828 Non-pressure chronic ulcer of other part of left lower leg with other specified severity: Secondary | ICD-10-CM | POA: Diagnosis not present

## 2023-10-18 DIAGNOSIS — E11621 Type 2 diabetes mellitus with foot ulcer: Secondary | ICD-10-CM | POA: Diagnosis not present

## 2023-10-18 DIAGNOSIS — T8781 Dehiscence of amputation stump: Secondary | ICD-10-CM | POA: Diagnosis not present

## 2023-10-18 DIAGNOSIS — L97518 Non-pressure chronic ulcer of other part of right foot with other specified severity: Secondary | ICD-10-CM | POA: Diagnosis not present

## 2023-10-18 DIAGNOSIS — I82441 Acute embolism and thrombosis of right tibial vein: Secondary | ICD-10-CM | POA: Diagnosis not present

## 2023-10-18 DIAGNOSIS — E11622 Type 2 diabetes mellitus with other skin ulcer: Secondary | ICD-10-CM | POA: Diagnosis not present

## 2023-10-20 DIAGNOSIS — G929 Unspecified toxic encephalopathy: Secondary | ICD-10-CM | POA: Diagnosis not present

## 2023-10-20 DIAGNOSIS — L97828 Non-pressure chronic ulcer of other part of left lower leg with other specified severity: Secondary | ICD-10-CM | POA: Diagnosis not present

## 2023-10-20 DIAGNOSIS — I82441 Acute embolism and thrombosis of right tibial vein: Secondary | ICD-10-CM | POA: Diagnosis not present

## 2023-10-20 DIAGNOSIS — E11621 Type 2 diabetes mellitus with foot ulcer: Secondary | ICD-10-CM | POA: Diagnosis not present

## 2023-10-20 DIAGNOSIS — L89152 Pressure ulcer of sacral region, stage 2: Secondary | ICD-10-CM | POA: Diagnosis not present

## 2023-10-20 DIAGNOSIS — L89610 Pressure ulcer of right heel, unstageable: Secondary | ICD-10-CM | POA: Diagnosis not present

## 2023-10-20 DIAGNOSIS — L97518 Non-pressure chronic ulcer of other part of right foot with other specified severity: Secondary | ICD-10-CM | POA: Diagnosis not present

## 2023-10-20 DIAGNOSIS — E11622 Type 2 diabetes mellitus with other skin ulcer: Secondary | ICD-10-CM | POA: Diagnosis not present

## 2023-10-20 DIAGNOSIS — T8781 Dehiscence of amputation stump: Secondary | ICD-10-CM | POA: Diagnosis not present

## 2023-10-23 DIAGNOSIS — E11622 Type 2 diabetes mellitus with other skin ulcer: Secondary | ICD-10-CM | POA: Diagnosis not present

## 2023-10-23 DIAGNOSIS — L89610 Pressure ulcer of right heel, unstageable: Secondary | ICD-10-CM | POA: Diagnosis not present

## 2023-10-23 DIAGNOSIS — L97518 Non-pressure chronic ulcer of other part of right foot with other specified severity: Secondary | ICD-10-CM | POA: Diagnosis not present

## 2023-10-23 DIAGNOSIS — L89152 Pressure ulcer of sacral region, stage 2: Secondary | ICD-10-CM | POA: Diagnosis not present

## 2023-10-23 DIAGNOSIS — G929 Unspecified toxic encephalopathy: Secondary | ICD-10-CM | POA: Diagnosis not present

## 2023-10-23 DIAGNOSIS — T8781 Dehiscence of amputation stump: Secondary | ICD-10-CM | POA: Diagnosis not present

## 2023-10-23 DIAGNOSIS — I82441 Acute embolism and thrombosis of right tibial vein: Secondary | ICD-10-CM | POA: Diagnosis not present

## 2023-10-23 DIAGNOSIS — T879 Unspecified complications of amputation stump: Secondary | ICD-10-CM | POA: Diagnosis not present

## 2023-10-23 DIAGNOSIS — E11621 Type 2 diabetes mellitus with foot ulcer: Secondary | ICD-10-CM | POA: Diagnosis not present

## 2023-10-23 DIAGNOSIS — L97828 Non-pressure chronic ulcer of other part of left lower leg with other specified severity: Secondary | ICD-10-CM | POA: Diagnosis not present

## 2023-10-25 DIAGNOSIS — G929 Unspecified toxic encephalopathy: Secondary | ICD-10-CM | POA: Diagnosis not present

## 2023-10-25 DIAGNOSIS — L97412 Non-pressure chronic ulcer of right heel and midfoot with fat layer exposed: Secondary | ICD-10-CM | POA: Diagnosis not present

## 2023-10-25 DIAGNOSIS — E08621 Diabetes mellitus due to underlying condition with foot ulcer: Secondary | ICD-10-CM | POA: Diagnosis not present

## 2023-10-25 DIAGNOSIS — E113313 Type 2 diabetes mellitus with moderate nonproliferative diabetic retinopathy with macular edema, bilateral: Secondary | ICD-10-CM | POA: Diagnosis not present

## 2023-10-25 DIAGNOSIS — T8781 Dehiscence of amputation stump: Secondary | ICD-10-CM | POA: Diagnosis not present

## 2023-10-25 DIAGNOSIS — I739 Peripheral vascular disease, unspecified: Secondary | ICD-10-CM | POA: Diagnosis not present

## 2023-10-25 DIAGNOSIS — L89610 Pressure ulcer of right heel, unstageable: Secondary | ICD-10-CM | POA: Diagnosis not present

## 2023-10-25 DIAGNOSIS — L89152 Pressure ulcer of sacral region, stage 2: Secondary | ICD-10-CM | POA: Diagnosis not present

## 2023-10-25 DIAGNOSIS — E11621 Type 2 diabetes mellitus with foot ulcer: Secondary | ICD-10-CM | POA: Diagnosis not present

## 2023-10-25 DIAGNOSIS — E11622 Type 2 diabetes mellitus with other skin ulcer: Secondary | ICD-10-CM | POA: Diagnosis not present

## 2023-10-25 DIAGNOSIS — Z794 Long term (current) use of insulin: Secondary | ICD-10-CM | POA: Diagnosis not present

## 2023-10-25 DIAGNOSIS — I82441 Acute embolism and thrombosis of right tibial vein: Secondary | ICD-10-CM | POA: Diagnosis not present

## 2023-10-25 DIAGNOSIS — L97828 Non-pressure chronic ulcer of other part of left lower leg with other specified severity: Secondary | ICD-10-CM | POA: Diagnosis not present

## 2023-10-25 DIAGNOSIS — L97518 Non-pressure chronic ulcer of other part of right foot with other specified severity: Secondary | ICD-10-CM | POA: Diagnosis not present

## 2023-10-27 ENCOUNTER — Other Ambulatory Visit: Payer: Self-pay | Admitting: Medical-Surgical

## 2023-10-27 DIAGNOSIS — E11621 Type 2 diabetes mellitus with foot ulcer: Secondary | ICD-10-CM | POA: Diagnosis not present

## 2023-10-27 DIAGNOSIS — I82441 Acute embolism and thrombosis of right tibial vein: Secondary | ICD-10-CM | POA: Diagnosis not present

## 2023-10-27 DIAGNOSIS — L89610 Pressure ulcer of right heel, unstageable: Secondary | ICD-10-CM | POA: Diagnosis not present

## 2023-10-27 DIAGNOSIS — L97828 Non-pressure chronic ulcer of other part of left lower leg with other specified severity: Secondary | ICD-10-CM | POA: Diagnosis not present

## 2023-10-27 DIAGNOSIS — G929 Unspecified toxic encephalopathy: Secondary | ICD-10-CM | POA: Diagnosis not present

## 2023-10-27 DIAGNOSIS — L97518 Non-pressure chronic ulcer of other part of right foot with other specified severity: Secondary | ICD-10-CM | POA: Diagnosis not present

## 2023-10-27 DIAGNOSIS — L89152 Pressure ulcer of sacral region, stage 2: Secondary | ICD-10-CM | POA: Diagnosis not present

## 2023-10-27 DIAGNOSIS — E11622 Type 2 diabetes mellitus with other skin ulcer: Secondary | ICD-10-CM | POA: Diagnosis not present

## 2023-10-27 DIAGNOSIS — T8781 Dehiscence of amputation stump: Secondary | ICD-10-CM | POA: Diagnosis not present

## 2023-10-27 NOTE — Progress Notes (Signed)
 Medical screening examination/treatment was performed by qualified clinical staff member and as supervising provider I was immediately available for consultation/collaboration. I have reviewed documentation and agree with assessment and plan.  Thayer Ohm, DNP, APRN, FNP-BC [facility] and [facility]

## 2023-10-30 DIAGNOSIS — T8781 Dehiscence of amputation stump: Secondary | ICD-10-CM | POA: Diagnosis not present

## 2023-10-30 DIAGNOSIS — E11622 Type 2 diabetes mellitus with other skin ulcer: Secondary | ICD-10-CM | POA: Diagnosis not present

## 2023-10-30 DIAGNOSIS — L97828 Non-pressure chronic ulcer of other part of left lower leg with other specified severity: Secondary | ICD-10-CM | POA: Diagnosis not present

## 2023-10-30 DIAGNOSIS — L89152 Pressure ulcer of sacral region, stage 2: Secondary | ICD-10-CM | POA: Diagnosis not present

## 2023-10-30 DIAGNOSIS — E11621 Type 2 diabetes mellitus with foot ulcer: Secondary | ICD-10-CM | POA: Diagnosis not present

## 2023-10-30 DIAGNOSIS — L89610 Pressure ulcer of right heel, unstageable: Secondary | ICD-10-CM | POA: Diagnosis not present

## 2023-10-30 DIAGNOSIS — G929 Unspecified toxic encephalopathy: Secondary | ICD-10-CM | POA: Diagnosis not present

## 2023-10-30 DIAGNOSIS — L97518 Non-pressure chronic ulcer of other part of right foot with other specified severity: Secondary | ICD-10-CM | POA: Diagnosis not present

## 2023-10-30 DIAGNOSIS — I82441 Acute embolism and thrombosis of right tibial vein: Secondary | ICD-10-CM | POA: Diagnosis not present

## 2023-10-31 ENCOUNTER — Ambulatory Visit: Payer: Medicare HMO | Admitting: Medical-Surgical

## 2023-11-01 DIAGNOSIS — G929 Unspecified toxic encephalopathy: Secondary | ICD-10-CM | POA: Diagnosis not present

## 2023-11-01 DIAGNOSIS — L89152 Pressure ulcer of sacral region, stage 2: Secondary | ICD-10-CM | POA: Diagnosis not present

## 2023-11-01 DIAGNOSIS — L89610 Pressure ulcer of right heel, unstageable: Secondary | ICD-10-CM | POA: Diagnosis not present

## 2023-11-01 DIAGNOSIS — I82441 Acute embolism and thrombosis of right tibial vein: Secondary | ICD-10-CM | POA: Diagnosis not present

## 2023-11-01 DIAGNOSIS — L97828 Non-pressure chronic ulcer of other part of left lower leg with other specified severity: Secondary | ICD-10-CM | POA: Diagnosis not present

## 2023-11-01 DIAGNOSIS — T8781 Dehiscence of amputation stump: Secondary | ICD-10-CM | POA: Diagnosis not present

## 2023-11-01 DIAGNOSIS — E11622 Type 2 diabetes mellitus with other skin ulcer: Secondary | ICD-10-CM | POA: Diagnosis not present

## 2023-11-01 DIAGNOSIS — E11621 Type 2 diabetes mellitus with foot ulcer: Secondary | ICD-10-CM | POA: Diagnosis not present

## 2023-11-01 DIAGNOSIS — L97518 Non-pressure chronic ulcer of other part of right foot with other specified severity: Secondary | ICD-10-CM | POA: Diagnosis not present

## 2023-11-02 DIAGNOSIS — L97512 Non-pressure chronic ulcer of other part of right foot with fat layer exposed: Secondary | ICD-10-CM | POA: Diagnosis not present

## 2023-11-02 DIAGNOSIS — E11622 Type 2 diabetes mellitus with other skin ulcer: Secondary | ICD-10-CM | POA: Diagnosis not present

## 2023-11-02 DIAGNOSIS — Z89512 Acquired absence of left leg below knee: Secondary | ICD-10-CM | POA: Diagnosis not present

## 2023-11-02 DIAGNOSIS — L97418 Non-pressure chronic ulcer of right heel and midfoot with other specified severity: Secondary | ICD-10-CM | POA: Diagnosis not present

## 2023-11-02 DIAGNOSIS — E1152 Type 2 diabetes mellitus with diabetic peripheral angiopathy with gangrene: Secondary | ICD-10-CM | POA: Diagnosis not present

## 2023-11-02 DIAGNOSIS — F17210 Nicotine dependence, cigarettes, uncomplicated: Secondary | ICD-10-CM | POA: Diagnosis not present

## 2023-11-02 DIAGNOSIS — I70263 Atherosclerosis of native arteries of extremities with gangrene, bilateral legs: Secondary | ICD-10-CM | POA: Diagnosis not present

## 2023-11-02 DIAGNOSIS — L97822 Non-pressure chronic ulcer of other part of left lower leg with fat layer exposed: Secondary | ICD-10-CM | POA: Diagnosis not present

## 2023-11-02 DIAGNOSIS — E11621 Type 2 diabetes mellitus with foot ulcer: Secondary | ICD-10-CM | POA: Diagnosis not present

## 2023-11-02 DIAGNOSIS — L97812 Non-pressure chronic ulcer of other part of right lower leg with fat layer exposed: Secondary | ICD-10-CM | POA: Diagnosis not present

## 2023-11-02 DIAGNOSIS — L97412 Non-pressure chronic ulcer of right heel and midfoot with fat layer exposed: Secondary | ICD-10-CM | POA: Diagnosis not present

## 2023-11-02 DIAGNOSIS — E1151 Type 2 diabetes mellitus with diabetic peripheral angiopathy without gangrene: Secondary | ICD-10-CM | POA: Diagnosis not present

## 2023-11-02 DIAGNOSIS — L97312 Non-pressure chronic ulcer of right ankle with fat layer exposed: Secondary | ICD-10-CM | POA: Diagnosis not present

## 2023-11-03 ENCOUNTER — Other Ambulatory Visit: Payer: Self-pay | Admitting: Medical-Surgical

## 2023-11-03 DIAGNOSIS — F1190 Opioid use, unspecified, uncomplicated: Secondary | ICD-10-CM

## 2023-11-03 MED ORDER — INSULIN PEN NEEDLE 30G X 8 MM MISC: 11 refills | Status: DC

## 2023-11-03 NOTE — Telephone Encounter (Signed)
 Copied from CRM [id number]. Topic: Clinical - Medication Refill >> Nov 03, 2023 12:12 PM Emylou G wrote: Most Recent Primary Care Visit:  Provider: Chalmers Cater  Department: [facility]  Visit Type: MEDICARE AWV, SEQUENTIAL  Date: 10/16/2023  Medication: Oxycodone HCl 10 MG TABS  Has the patient contacted their pharmacy? No (Agent: If no, request that the patient contact the pharmacy for the refill. If patient does not wish to contact the pharmacy document the reason why and proceed with request.) (Agent: If yes, when and what did the pharmacy advise?)  Is this the correct pharmacy for this prescription? Yes If no, delete pharmacy and type the correct one.  This is the patient's preferred pharmacy:    [id number] - [place], [place] - [address] AT [facility] [address] [place] [address] Phone: [phone] Fax: [phone]   Has the prescription been filled recently? Yes  Is the patient out of the medication? Yes 1 left  Has the patient been seen for an appointment in the last year OR does the patient have an upcoming appointment? Yes  Can we respond through MyChart? No  Agent: Please be advised that Rx refills may take up to 3 business days. We ask that you follow-up with your pharmacy.

## 2023-11-03 NOTE — Telephone Encounter (Signed)
 Copied from CRM [id number]. Topic: Clinical - Medication Refill >> Nov 03, 2023 12:09 PM Emylou G wrote: Most Recent Primary Care Visit:  Provider: Chalmers Cater  Department: [facility]  Visit Type: MEDICARE AWV, SEQUENTIAL  Date: 10/16/2023  Medication: Insulin Pen Needle (NOVOFINE) 30G X 8 MM MISC  Has the patient contacted their pharmacy? No (Agent: If no, request that the patient contact the pharmacy for the refill. If patient does not wish to contact the pharmacy document the reason why and proceed with request.) (Agent: If yes, when and what did the pharmacy advise?)  Is this the correct pharmacy for this prescription? Yes If no, delete pharmacy and type the correct one.  This is the patient's preferred pharmacy:    [facility] #[id number] - [place], [place] - [address] AT [facility] OF [address] [place] [address] Phone: [phone] Fax: [phone]   Has the prescription been filled recently? No  Is the patient out of the medication? Yes 5 left  Has the patient been seen for an appointment in the last year OR does the patient have an upcoming appointment? Yes  Can we respond through MyChart? No  Agent: Please be advised that Rx refills may take up to 3 business days. We ask that you follow-up with your pharmacy.

## 2023-11-03 NOTE — Telephone Encounter (Signed)
 Requesting rx rf of  oxycodone 10mg  10/03/2023 Insulin pen needlles. 03/17/2023 ( Showing use to inject Toujeo-toujeo shows last filled 12/24/2020) Last OV 10/12/2023 Upcoming appt 11/07/2023

## 2023-11-06 DIAGNOSIS — E11621 Type 2 diabetes mellitus with foot ulcer: Secondary | ICD-10-CM | POA: Diagnosis not present

## 2023-11-06 DIAGNOSIS — L89152 Pressure ulcer of sacral region, stage 2: Secondary | ICD-10-CM | POA: Diagnosis not present

## 2023-11-06 DIAGNOSIS — L97828 Non-pressure chronic ulcer of other part of left lower leg with other specified severity: Secondary | ICD-10-CM | POA: Diagnosis not present

## 2023-11-06 DIAGNOSIS — T8781 Dehiscence of amputation stump: Secondary | ICD-10-CM | POA: Diagnosis not present

## 2023-11-06 DIAGNOSIS — E11622 Type 2 diabetes mellitus with other skin ulcer: Secondary | ICD-10-CM | POA: Diagnosis not present

## 2023-11-06 DIAGNOSIS — G929 Unspecified toxic encephalopathy: Secondary | ICD-10-CM | POA: Diagnosis not present

## 2023-11-06 DIAGNOSIS — I82441 Acute embolism and thrombosis of right tibial vein: Secondary | ICD-10-CM | POA: Diagnosis not present

## 2023-11-06 DIAGNOSIS — L97518 Non-pressure chronic ulcer of other part of right foot with other specified severity: Secondary | ICD-10-CM | POA: Diagnosis not present

## 2023-11-06 DIAGNOSIS — L89610 Pressure ulcer of right heel, unstageable: Secondary | ICD-10-CM | POA: Diagnosis not present

## 2023-11-07 ENCOUNTER — Ambulatory Visit (INDEPENDENT_AMBULATORY_CARE_PROVIDER_SITE_OTHER): Admitting: Medical-Surgical

## 2023-11-07 ENCOUNTER — Encounter: Payer: Self-pay | Admitting: Medical-Surgical

## 2023-11-07 VITALS — BP 130/59 | HR 72 | Resp 20 | Ht 69.0 in

## 2023-11-07 DIAGNOSIS — G8929 Other chronic pain: Secondary | ICD-10-CM | POA: Diagnosis not present

## 2023-11-07 DIAGNOSIS — F1190 Opioid use, unspecified, uncomplicated: Secondary | ICD-10-CM

## 2023-11-07 MED ORDER — OXYCODONE HCL 10 MG PO TABS: 5.0000 mg | ORAL_TABLET | Freq: Four times a day (QID) | ORAL | 0 refills | Status: DC | PRN

## 2023-11-07 MED ORDER — PREGABALIN 100 MG PO CAPS: 100.0000 mg | ORAL_CAPSULE | Freq: Three times a day (TID) | ORAL | 2 refills | Status: DC

## 2023-11-07 MED ORDER — INSULIN PEN NEEDLE 30G X 8 MM MISC: 11 refills | Status: DC

## 2023-11-07 NOTE — Progress Notes (Signed)
        Established patient visit  History, exam, impression, and plan:  1. Chronic, continuous use of opioids (Primary) 2. Other chronic pain Pleasant 63 year old male presenting today for follow-up on chronic pain and opioid use.  He has been under contract with our practice for oxycodone 30 tabs for a 90-day supply however at his last visit we did increase that to 40 tablets for 90-day supply because of recent foot wounds and left below the knee amputation.  Approximately 6 weeks ago, he was started on pregabalin 50 mg twice daily.  Has been tolerating this well without side effects.  Has not noticed a difference in his pain level with the addition of the medication but is amenable to continued medication with dose changes.  Notes that his chronic pain affects multiple areas of his body but most recently his right lower leg has been painful.  The wounds on his foot do not hurt.  He has gotten in with the wound care center and is having regular follow-ups with them.  Today he is requesting a refill of the oxycodone however this was just sent on 10/03/2023 for a 90-day supply of 40 tablets.  He is down to 1 tablet and then he will be out of the medication.  Reiterated the importance of following prescribing information and his pain contract.  Advised that I will go ahead and refill his 40 tablets today but this will be a 90-day supply and he will get no further refills until July 1.  If he uses more than the allotted medication during that time, we will plan for referral to a pain clinic for chronic pain management.  Today, he has done well tolerating the pregabalin so we are increasing to 100 mg twice daily.  Sending in for 3 times daily with verbalized instructions to take twice daily but if his pain level is extremely high then he can take an extra afternoon dose to equal 3 times daily.  Advised him to reach for the afternoon dose of pregabalin before he considers taking an oxycodone.  Patient verbalized  understanding and is agreeable to the plan.  We will follow-up closely in 4-6 weeks for reevaluation. - Oxycodone HCl 10 MG TABS; Take 0.5-1 tablets (5-10 mg total) by mouth every 6 (six) hours as needed. #40 tabs to last 90 days.  Dispense: 40 tablet; Refill: 0  Procedures performed this visit: None.  Return in about 4 weeks (around 12/05/2023) for chronic pain follow up.  __________________________________ Thayer Ohm, DNP, APRN, FNP-BC Primary Care and Sports Medicine [facility]

## 2023-11-08 ENCOUNTER — Telehealth: Payer: Self-pay

## 2023-11-08 ENCOUNTER — Other Ambulatory Visit (HOSPITAL_COMMUNITY): Payer: Self-pay

## 2023-11-08 DIAGNOSIS — T8781 Dehiscence of amputation stump: Secondary | ICD-10-CM | POA: Diagnosis not present

## 2023-11-08 DIAGNOSIS — I82441 Acute embolism and thrombosis of right tibial vein: Secondary | ICD-10-CM | POA: Diagnosis not present

## 2023-11-08 DIAGNOSIS — E11622 Type 2 diabetes mellitus with other skin ulcer: Secondary | ICD-10-CM | POA: Diagnosis not present

## 2023-11-08 DIAGNOSIS — L97518 Non-pressure chronic ulcer of other part of right foot with other specified severity: Secondary | ICD-10-CM | POA: Diagnosis not present

## 2023-11-08 DIAGNOSIS — G929 Unspecified toxic encephalopathy: Secondary | ICD-10-CM | POA: Diagnosis not present

## 2023-11-08 DIAGNOSIS — L89610 Pressure ulcer of right heel, unstageable: Secondary | ICD-10-CM | POA: Diagnosis not present

## 2023-11-08 DIAGNOSIS — L89152 Pressure ulcer of sacral region, stage 2: Secondary | ICD-10-CM | POA: Diagnosis not present

## 2023-11-08 DIAGNOSIS — E11621 Type 2 diabetes mellitus with foot ulcer: Secondary | ICD-10-CM | POA: Diagnosis not present

## 2023-11-08 DIAGNOSIS — L97828 Non-pressure chronic ulcer of other part of left lower leg with other specified severity: Secondary | ICD-10-CM | POA: Diagnosis not present

## 2023-11-08 NOTE — Telephone Encounter (Signed)
 Prior Authorization form/request asks a question that requires your assistance. Please see the question below and advise accordingly. The PA will not be submitted until the necessary information is received.

## 2023-11-08 NOTE — Telephone Encounter (Signed)
 Pharmacy Patient Advocate Encounter   Received notification from Onbase that prior authorization for Novofine pen needles is required/requested.   Insurance verification completed.   The patient is insured through [employer] .   Per test claim: PA required; PA submitted to above mentioned insurance via CoverMyMeds Key/confirmation #/EOC [id number] Status is pending

## 2023-11-09 DIAGNOSIS — L97912 Non-pressure chronic ulcer of unspecified part of right lower leg with fat layer exposed: Secondary | ICD-10-CM | POA: Diagnosis not present

## 2023-11-09 DIAGNOSIS — Z89512 Acquired absence of left leg below knee: Secondary | ICD-10-CM | POA: Diagnosis not present

## 2023-11-09 DIAGNOSIS — L97922 Non-pressure chronic ulcer of unspecified part of left lower leg with fat layer exposed: Secondary | ICD-10-CM | POA: Diagnosis not present

## 2023-11-09 DIAGNOSIS — L97512 Non-pressure chronic ulcer of other part of right foot with fat layer exposed: Secondary | ICD-10-CM | POA: Diagnosis not present

## 2023-11-09 DIAGNOSIS — Z9889 Other specified postprocedural states: Secondary | ICD-10-CM | POA: Diagnosis not present

## 2023-11-09 DIAGNOSIS — F17210 Nicotine dependence, cigarettes, uncomplicated: Secondary | ICD-10-CM | POA: Diagnosis not present

## 2023-11-09 DIAGNOSIS — F17200 Nicotine dependence, unspecified, uncomplicated: Secondary | ICD-10-CM | POA: Diagnosis not present

## 2023-11-09 DIAGNOSIS — L97412 Non-pressure chronic ulcer of right heel and midfoot with fat layer exposed: Secondary | ICD-10-CM | POA: Diagnosis not present

## 2023-11-09 DIAGNOSIS — L97419 Non-pressure chronic ulcer of right heel and midfoot with unspecified severity: Secondary | ICD-10-CM | POA: Diagnosis not present

## 2023-11-09 DIAGNOSIS — E11621 Type 2 diabetes mellitus with foot ulcer: Secondary | ICD-10-CM | POA: Diagnosis not present

## 2023-11-09 DIAGNOSIS — L97919 Non-pressure chronic ulcer of unspecified part of right lower leg with unspecified severity: Secondary | ICD-10-CM | POA: Diagnosis not present

## 2023-11-09 DIAGNOSIS — E11622 Type 2 diabetes mellitus with other skin ulcer: Secondary | ICD-10-CM | POA: Diagnosis not present

## 2023-11-10 ENCOUNTER — Telehealth: Payer: Self-pay

## 2023-11-10 NOTE — Telephone Encounter (Signed)
 Copied from CRM [id number]. Topic: Clinical - Prescription Issue >> Nov 10, 2023 11:31 AM Hamdi H wrote: Reason for CRM: Alex called from [facility] needing clinical questions answered for prior authorization for Insulin Pen Needle (NOVOFINE) 30G X 8 MM MISC. Please give them a call back at [phone] Reference #: [id number].

## 2023-11-13 DIAGNOSIS — L97518 Non-pressure chronic ulcer of other part of right foot with other specified severity: Secondary | ICD-10-CM | POA: Diagnosis not present

## 2023-11-13 DIAGNOSIS — I82441 Acute embolism and thrombosis of right tibial vein: Secondary | ICD-10-CM | POA: Diagnosis not present

## 2023-11-13 DIAGNOSIS — T8781 Dehiscence of amputation stump: Secondary | ICD-10-CM | POA: Diagnosis not present

## 2023-11-13 DIAGNOSIS — G929 Unspecified toxic encephalopathy: Secondary | ICD-10-CM | POA: Diagnosis not present

## 2023-11-13 DIAGNOSIS — L97828 Non-pressure chronic ulcer of other part of left lower leg with other specified severity: Secondary | ICD-10-CM | POA: Diagnosis not present

## 2023-11-13 DIAGNOSIS — E11622 Type 2 diabetes mellitus with other skin ulcer: Secondary | ICD-10-CM | POA: Diagnosis not present

## 2023-11-13 DIAGNOSIS — E11621 Type 2 diabetes mellitus with foot ulcer: Secondary | ICD-10-CM | POA: Diagnosis not present

## 2023-11-13 DIAGNOSIS — L89610 Pressure ulcer of right heel, unstageable: Secondary | ICD-10-CM | POA: Diagnosis not present

## 2023-11-13 DIAGNOSIS — L89152 Pressure ulcer of sacral region, stage 2: Secondary | ICD-10-CM | POA: Diagnosis not present

## 2023-11-15 DIAGNOSIS — L89610 Pressure ulcer of right heel, unstageable: Secondary | ICD-10-CM | POA: Diagnosis not present

## 2023-11-15 DIAGNOSIS — L97828 Non-pressure chronic ulcer of other part of left lower leg with other specified severity: Secondary | ICD-10-CM | POA: Diagnosis not present

## 2023-11-15 DIAGNOSIS — G929 Unspecified toxic encephalopathy: Secondary | ICD-10-CM | POA: Diagnosis not present

## 2023-11-15 DIAGNOSIS — T8781 Dehiscence of amputation stump: Secondary | ICD-10-CM | POA: Diagnosis not present

## 2023-11-15 DIAGNOSIS — E11621 Type 2 diabetes mellitus with foot ulcer: Secondary | ICD-10-CM | POA: Diagnosis not present

## 2023-11-15 DIAGNOSIS — I82441 Acute embolism and thrombosis of right tibial vein: Secondary | ICD-10-CM | POA: Diagnosis not present

## 2023-11-15 DIAGNOSIS — L97518 Non-pressure chronic ulcer of other part of right foot with other specified severity: Secondary | ICD-10-CM | POA: Diagnosis not present

## 2023-11-15 DIAGNOSIS — L89152 Pressure ulcer of sacral region, stage 2: Secondary | ICD-10-CM | POA: Diagnosis not present

## 2023-11-15 DIAGNOSIS — E11622 Type 2 diabetes mellitus with other skin ulcer: Secondary | ICD-10-CM | POA: Diagnosis not present

## 2023-11-16 ENCOUNTER — Other Ambulatory Visit: Payer: Self-pay | Admitting: Medical-Surgical

## 2023-11-16 NOTE — Telephone Encounter (Signed)
 Copied from CRM [id number]. Topic: Clinical - Medication Refill >> Nov 16, 2023 12:25 PM Thomes Dinning wrote: Most Recent Primary Care Visit:  Provider: [name]  Department: [facility]  Visit Type: OFFICE VISIT  Date: 11/07/2023  Medication: divalproex (DEPAKOTE ER) 500 MG 24 hr tablet  Has the patient contacted their pharmacy? No (Agent: If no, request that the patient contact the pharmacy for the refill. If patient does not wish to contact the pharmacy document the reason why and proceed with request.) (Agent: If yes, when and what did the pharmacy advise?)  Is this the correct pharmacy for this prescription? Yes If no, delete pharmacy and type the correct one.  This is the patient's preferred pharmacy: [facility] #[id number] - [place], [place] - [address] AT [facility] [address] [place] [address] Phone: [phone] Fax: [phone]  Has the prescription been filled recently? Yes  Is the patient out of the medication? Yes  Has the patient been seen for an appointment in the last year OR does the patient have an upcoming appointment? Yes  Can we respond through MyChart? Yes  Agent: Please be advised that Rx refills may take up to 3 business days. We ask that you follow-up with your pharmacy.

## 2023-11-16 NOTE — Telephone Encounter (Signed)
 This should be managed by Neurology ideally. Need clarification on dosing since this says daily but there is info in the chart saying twice daily.

## 2023-11-17 ENCOUNTER — Other Ambulatory Visit: Payer: Self-pay | Admitting: Medical-Surgical

## 2023-11-17 DIAGNOSIS — L03115 Cellulitis of right lower limb: Secondary | ICD-10-CM | POA: Diagnosis not present

## 2023-11-17 DIAGNOSIS — L97519 Non-pressure chronic ulcer of other part of right foot with unspecified severity: Secondary | ICD-10-CM | POA: Diagnosis not present

## 2023-11-17 DIAGNOSIS — L89152 Pressure ulcer of sacral region, stage 2: Secondary | ICD-10-CM | POA: Diagnosis not present

## 2023-11-17 DIAGNOSIS — I130 Hypertensive heart and chronic kidney disease with heart failure and stage 1 through stage 4 chronic kidney disease, or unspecified chronic kidney disease: Secondary | ICD-10-CM | POA: Diagnosis not present

## 2023-11-17 DIAGNOSIS — N184 Chronic kidney disease, stage 4 (severe): Secondary | ICD-10-CM | POA: Diagnosis not present

## 2023-11-17 DIAGNOSIS — L97511 Non-pressure chronic ulcer of other part of right foot limited to breakdown of skin: Secondary | ICD-10-CM | POA: Diagnosis not present

## 2023-11-17 DIAGNOSIS — R509 Fever, unspecified: Secondary | ICD-10-CM | POA: Diagnosis not present

## 2023-11-17 DIAGNOSIS — N179 Acute kidney failure, unspecified: Secondary | ICD-10-CM | POA: Diagnosis not present

## 2023-11-17 DIAGNOSIS — I82441 Acute embolism and thrombosis of right tibial vein: Secondary | ICD-10-CM | POA: Diagnosis not present

## 2023-11-17 DIAGNOSIS — E1122 Type 2 diabetes mellitus with diabetic chronic kidney disease: Secondary | ICD-10-CM | POA: Diagnosis not present

## 2023-11-17 DIAGNOSIS — L97828 Non-pressure chronic ulcer of other part of left lower leg with other specified severity: Secondary | ICD-10-CM | POA: Diagnosis not present

## 2023-11-17 DIAGNOSIS — I4510 Unspecified right bundle-branch block: Secondary | ICD-10-CM | POA: Diagnosis not present

## 2023-11-17 DIAGNOSIS — T8781 Dehiscence of amputation stump: Secondary | ICD-10-CM | POA: Diagnosis not present

## 2023-11-17 DIAGNOSIS — R0902 Hypoxemia: Secondary | ICD-10-CM | POA: Diagnosis not present

## 2023-11-17 DIAGNOSIS — J449 Chronic obstructive pulmonary disease, unspecified: Secondary | ICD-10-CM | POA: Diagnosis not present

## 2023-11-17 DIAGNOSIS — G929 Unspecified toxic encephalopathy: Secondary | ICD-10-CM | POA: Diagnosis not present

## 2023-11-17 DIAGNOSIS — L97518 Non-pressure chronic ulcer of other part of right foot with other specified severity: Secondary | ICD-10-CM | POA: Diagnosis not present

## 2023-11-17 DIAGNOSIS — R609 Edema, unspecified: Secondary | ICD-10-CM | POA: Diagnosis not present

## 2023-11-17 DIAGNOSIS — E11621 Type 2 diabetes mellitus with foot ulcer: Secondary | ICD-10-CM | POA: Diagnosis not present

## 2023-11-17 DIAGNOSIS — E1165 Type 2 diabetes mellitus with hyperglycemia: Secondary | ICD-10-CM | POA: Diagnosis not present

## 2023-11-17 DIAGNOSIS — M79673 Pain in unspecified foot: Secondary | ICD-10-CM | POA: Diagnosis not present

## 2023-11-17 DIAGNOSIS — L97419 Non-pressure chronic ulcer of right heel and midfoot with unspecified severity: Secondary | ICD-10-CM | POA: Diagnosis not present

## 2023-11-17 DIAGNOSIS — E11622 Type 2 diabetes mellitus with other skin ulcer: Secondary | ICD-10-CM | POA: Diagnosis not present

## 2023-11-17 DIAGNOSIS — N189 Chronic kidney disease, unspecified: Secondary | ICD-10-CM | POA: Diagnosis not present

## 2023-11-17 DIAGNOSIS — I739 Peripheral vascular disease, unspecified: Secondary | ICD-10-CM | POA: Diagnosis not present

## 2023-11-17 DIAGNOSIS — E1169 Type 2 diabetes mellitus with other specified complication: Secondary | ICD-10-CM | POA: Diagnosis not present

## 2023-11-17 DIAGNOSIS — L89610 Pressure ulcer of right heel, unstageable: Secondary | ICD-10-CM | POA: Diagnosis not present

## 2023-11-17 DIAGNOSIS — Z89512 Acquired absence of left leg below knee: Secondary | ICD-10-CM | POA: Diagnosis not present

## 2023-11-17 DIAGNOSIS — D649 Anemia, unspecified: Secondary | ICD-10-CM | POA: Diagnosis not present

## 2023-11-17 DIAGNOSIS — R918 Other nonspecific abnormal finding of lung field: Secondary | ICD-10-CM | POA: Diagnosis not present

## 2023-11-17 DIAGNOSIS — S91301A Unspecified open wound, right foot, initial encounter: Secondary | ICD-10-CM | POA: Diagnosis not present

## 2023-11-17 DIAGNOSIS — D631 Anemia in chronic kidney disease: Secondary | ICD-10-CM | POA: Diagnosis not present

## 2023-11-17 DIAGNOSIS — I10 Essential (primary) hypertension: Secondary | ICD-10-CM | POA: Diagnosis not present

## 2023-11-17 DIAGNOSIS — E875 Hyperkalemia: Secondary | ICD-10-CM | POA: Diagnosis not present

## 2023-11-17 DIAGNOSIS — I509 Heart failure, unspecified: Secondary | ICD-10-CM | POA: Diagnosis not present

## 2023-11-17 NOTE — Telephone Encounter (Signed)
 Copied from CRM [id number]. Topic: Clinical - Medication Refill >> Nov 17, 2023 11:35 AM Yvone Neu wrote: Most Recent Primary Care Visit:  Provider: [name]  Department: [facility]  Visit Type: OFFICE VISIT  Date: 11/07/2023  Medication: Insulin Pen Needle (NOVOFINE) 30G X 8 MM MISC  Has the patient contacted their pharmacy? Yes (Agent: If no, request that the patient contact the pharmacy for the refill. If patient does not wish to contact the pharmacy document the reason why and proceed with request.) (Agent: If yes, when and what did the pharmacy advise?)  Is this the correct pharmacy for this prescription? Yes If no, delete pharmacy and type the correct one.  This is the patient's preferred pharmacy:   [facility] #[id number] - [place], [place] - [address] AT [facility] [address] [place] [address] Phone: [phone] Fax: [phone]   Has the prescription been filled recently? Yes  Is the patient out of the medication? Yes  Has the patient been seen for an appointment in the last year OR does the patient have an upcoming appointment? Yes  Can we respond through MyChart? Yes  Agent: Please be advised that Rx refills may take up to 3 business days. We ask that you follow-up with your pharmacy.

## 2023-11-18 DIAGNOSIS — E875 Hyperkalemia: Secondary | ICD-10-CM | POA: Diagnosis not present

## 2023-11-19 DIAGNOSIS — I4510 Unspecified right bundle-branch block: Secondary | ICD-10-CM | POA: Diagnosis not present

## 2023-11-20 NOTE — Telephone Encounter (Signed)
 Patient prescribed Novofine needles on 11/07/23 with 11 refills. Cancelled reorder. [employer], they state the Novofine insulin pen needles are not covered by insurance. Pharmacy asking if it can be switched to BD pen needles (31G or 32G).

## 2023-11-21 ENCOUNTER — Telehealth: Payer: Self-pay | Admitting: *Deleted

## 2023-11-21 DIAGNOSIS — E11622 Type 2 diabetes mellitus with other skin ulcer: Secondary | ICD-10-CM | POA: Diagnosis not present

## 2023-11-21 DIAGNOSIS — G929 Unspecified toxic encephalopathy: Secondary | ICD-10-CM | POA: Diagnosis not present

## 2023-11-21 DIAGNOSIS — L97518 Non-pressure chronic ulcer of other part of right foot with other specified severity: Secondary | ICD-10-CM | POA: Diagnosis not present

## 2023-11-21 DIAGNOSIS — L97828 Non-pressure chronic ulcer of other part of left lower leg with other specified severity: Secondary | ICD-10-CM | POA: Diagnosis not present

## 2023-11-21 DIAGNOSIS — T8781 Dehiscence of amputation stump: Secondary | ICD-10-CM | POA: Diagnosis not present

## 2023-11-21 DIAGNOSIS — I82441 Acute embolism and thrombosis of right tibial vein: Secondary | ICD-10-CM | POA: Diagnosis not present

## 2023-11-21 DIAGNOSIS — L89152 Pressure ulcer of sacral region, stage 2: Secondary | ICD-10-CM | POA: Diagnosis not present

## 2023-11-21 DIAGNOSIS — E11621 Type 2 diabetes mellitus with foot ulcer: Secondary | ICD-10-CM | POA: Diagnosis not present

## 2023-11-21 DIAGNOSIS — L89610 Pressure ulcer of right heel, unstageable: Secondary | ICD-10-CM | POA: Diagnosis not present

## 2023-11-21 NOTE — Transitions of Care (Post Inpatient/ED Visit) (Signed)
 11/21/2023  Name: Joseph Grimes MRN: [id number] DOB: Dec 16, 1960  Today's TOC FU Call Status: Today's TOC FU Call Status:: Successful TOC FU Call Completed TOC FU Call Complete Date: 11/21/23 Patient's Name and Date of Birth confirmed.  Transition Care Management Follow-up Telephone Call Date of Discharge: 11/20/23 Discharge Facility: Other [facility]) Name of Other (Non[facility]) Discharge Facility: [facility] [place] Type of Discharge: Inpatient Admission Primary Inpatient Discharge Diagnosis:: Hyperkalemia/ rt foot ulcer How have you been since you were released from the hospital?: Better Any questions or concerns?: No  Items Reviewed: Did you receive and understand the discharge instructions provided?: Yes Medications obtained,verified, and reconciled?: Yes (Medications Reviewed) Any new allergies since your discharge?: No Dietary orders reviewed?: No Do you have support at home?: Yes People in Home [RPT]: child(ren), adult, grandchild(ren) Name of Support/Comfort Primary Source: Joseph Grimes daughter  Medications Reviewed Today: Medications Reviewed Today   Medications were not reviewed in this encounter     Home Care and Equipment/Supplies: Were Home Health Services Ordered?: Yes Name of Home Health Agency:: [facility] Has Agency set up a time to come to your home?: Yes First Home Health Visit Date: 11/21/23 Any new equipment or medical supplies ordered?: NA  Functional Questionnaire: Do you need assistance with bathing/showering or dressing?: No Do you need assistance with meal preparation?: Yes Do you need assistance with eating?: No Do you have difficulty maintaining continence: No Do you need assistance with getting out of bed/getting out of a chair/moving?: Yes Do you have difficulty managing or taking your medications?: No  Follow up appointments reviewed: PCP Follow-up appointment confirmed?: Yes Date of PCP follow-up appointment?: 12/06/23 Follow-up  Provider: [name] NP Specialist Hospital Follow-up appointment confirmed?: Yes Date of Specialist follow-up appointment?: 11/27/23 Follow-Up Specialty Provider:: [id number] Dr Dilley/ [address] Do you need transportation to your follow-up appointment?: No Do you understand care options if your condition(s) worsen?: Yes-patient verbalized understanding  SDOH Interventions Today    Flowsheet Row Most Recent Value  SDOH Interventions   Food Insecurity Interventions Intervention Not Indicated  Housing Interventions Intervention Not Indicated  Transportation Interventions Intervention Not Indicated  Utilities Interventions Intervention Not Indicated       Goals Addressed             This Visit's Progress    VBCI Transitions of Care (TOC) Care Plan       Problems:  Recent Hospitalization for treatment of DMII,  Hyperkalemia, rt heel ulcer Medication management barrier Check blood sugars as per order and take medications as per Dr ordered  Goal:  Over the next 30 days, the patient will not experience hospital readmission  Interventions:  Transitions of Care: Doctor Visits  - discussed the importance of doctor visits Post discharge activity limitations prescribed by provider reviewed  Diabetes Interventions: Assessed patient's understanding of A1c goal: <6.5% Reviewed medications with patient and discussed importance of medication adherence Reviewed scheduled/upcoming provider appointments including: [id number] Lab Results  Component Value Date   HGBA1C 7.4 (A) 10/03/2023   HGBA1C 7.4 (A) 10/03/2023    Patient Self Care Activities:  Attend all scheduled provider appointments Call pharmacy for medication refills 3-7 days in advance of running out of medications Call provider office for new concerns or questions  Notify RN Care Manager of TOC call rescheduling needs Participate in Transition of Care Program/Attend TOC scheduled calls Take medications as prescribed    check blood sugar at prescribed times: 4 times daily check feet daily for cuts, sores  or redness set a realistic goal wear comfortable, cotton socks  Plan:  An initial telephone outreach has been scheduled for:  Next PCP appointment scheduled for: [id number]        Patient consented to follow up outreach calls from [facility] scheduled telephone call for [phone] 1:15 Una Ganser BSN RN [facility] Health Care Management Coordinator [email or web address] Direct Dial: [phone]  Fax: [phone] Website: [email or web address]

## 2023-11-23 MED ORDER — INSULIN PEN NEEDLE 31G X 8 MM MISC: 3 refills | Status: AC

## 2023-11-24 DIAGNOSIS — L89610 Pressure ulcer of right heel, unstageable: Secondary | ICD-10-CM | POA: Diagnosis not present

## 2023-11-24 DIAGNOSIS — E11622 Type 2 diabetes mellitus with other skin ulcer: Secondary | ICD-10-CM | POA: Diagnosis not present

## 2023-11-24 DIAGNOSIS — E11621 Type 2 diabetes mellitus with foot ulcer: Secondary | ICD-10-CM | POA: Diagnosis not present

## 2023-11-24 DIAGNOSIS — I5033 Acute on chronic diastolic (congestive) heart failure: Secondary | ICD-10-CM | POA: Diagnosis not present

## 2023-11-24 DIAGNOSIS — I82441 Acute embolism and thrombosis of right tibial vein: Secondary | ICD-10-CM | POA: Diagnosis not present

## 2023-11-24 DIAGNOSIS — L03115 Cellulitis of right lower limb: Secondary | ICD-10-CM | POA: Diagnosis not present

## 2023-11-24 DIAGNOSIS — L97828 Non-pressure chronic ulcer of other part of left lower leg with other specified severity: Secondary | ICD-10-CM | POA: Diagnosis not present

## 2023-11-24 DIAGNOSIS — T8781 Dehiscence of amputation stump: Secondary | ICD-10-CM | POA: Diagnosis not present

## 2023-11-24 DIAGNOSIS — L97518 Non-pressure chronic ulcer of other part of right foot with other specified severity: Secondary | ICD-10-CM | POA: Diagnosis not present

## 2023-11-27 DIAGNOSIS — E11622 Type 2 diabetes mellitus with other skin ulcer: Secondary | ICD-10-CM | POA: Diagnosis not present

## 2023-11-27 DIAGNOSIS — L97512 Non-pressure chronic ulcer of other part of right foot with fat layer exposed: Secondary | ICD-10-CM | POA: Diagnosis not present

## 2023-11-27 DIAGNOSIS — E11621 Type 2 diabetes mellitus with foot ulcer: Secondary | ICD-10-CM | POA: Diagnosis not present

## 2023-11-27 DIAGNOSIS — L97312 Non-pressure chronic ulcer of right ankle with fat layer exposed: Secondary | ICD-10-CM | POA: Diagnosis not present

## 2023-11-27 DIAGNOSIS — F17210 Nicotine dependence, cigarettes, uncomplicated: Secondary | ICD-10-CM | POA: Diagnosis not present

## 2023-11-27 DIAGNOSIS — L97412 Non-pressure chronic ulcer of right heel and midfoot with fat layer exposed: Secondary | ICD-10-CM | POA: Diagnosis not present

## 2023-11-27 DIAGNOSIS — Z89512 Acquired absence of left leg below knee: Secondary | ICD-10-CM | POA: Diagnosis not present

## 2023-11-27 DIAGNOSIS — Z9889 Other specified postprocedural states: Secondary | ICD-10-CM | POA: Diagnosis not present

## 2023-11-27 DIAGNOSIS — L97922 Non-pressure chronic ulcer of unspecified part of left lower leg with fat layer exposed: Secondary | ICD-10-CM | POA: Diagnosis not present

## 2023-11-27 DIAGNOSIS — L97912 Non-pressure chronic ulcer of unspecified part of right lower leg with fat layer exposed: Secondary | ICD-10-CM | POA: Diagnosis not present

## 2023-11-29 ENCOUNTER — Telehealth: Payer: Self-pay

## 2023-11-29 DIAGNOSIS — L89610 Pressure ulcer of right heel, unstageable: Secondary | ICD-10-CM | POA: Diagnosis not present

## 2023-11-29 DIAGNOSIS — I82441 Acute embolism and thrombosis of right tibial vein: Secondary | ICD-10-CM | POA: Diagnosis not present

## 2023-11-29 DIAGNOSIS — E11621 Type 2 diabetes mellitus with foot ulcer: Secondary | ICD-10-CM | POA: Diagnosis not present

## 2023-11-29 DIAGNOSIS — T8781 Dehiscence of amputation stump: Secondary | ICD-10-CM | POA: Diagnosis not present

## 2023-11-29 DIAGNOSIS — I5033 Acute on chronic diastolic (congestive) heart failure: Secondary | ICD-10-CM | POA: Diagnosis not present

## 2023-11-29 DIAGNOSIS — E11622 Type 2 diabetes mellitus with other skin ulcer: Secondary | ICD-10-CM | POA: Diagnosis not present

## 2023-11-29 DIAGNOSIS — L03115 Cellulitis of right lower limb: Secondary | ICD-10-CM | POA: Diagnosis not present

## 2023-11-29 DIAGNOSIS — L97518 Non-pressure chronic ulcer of other part of right foot with other specified severity: Secondary | ICD-10-CM | POA: Diagnosis not present

## 2023-11-29 DIAGNOSIS — L97828 Non-pressure chronic ulcer of other part of left lower leg with other specified severity: Secondary | ICD-10-CM | POA: Diagnosis not present

## 2023-11-29 NOTE — Transitions of Care (Post Inpatient/ED Visit) (Signed)
 Care Management  Transitions of Care Program Transitions of Care Post-discharge week 2  11/29/2023 Name: JADIEL SCHMIEDER MRN: [id number] DOB: 1961/08/04  Subjective: Joseph Grimes is a 63 y.o. year old male who is a primary care patient of Cherre Cornish, NP. The Care Management team was unable to reach the patient by phone to assess and address transitions of care needs.   Plan: Additional outreach attempts will be made to reach the patient enrolled in the [facility] Program (Post Inpatient/ED Visit).  Tonia Frankel RN, CCM [place]  VBCI-Population Health RN Care Manager [phone]

## 2023-11-30 ENCOUNTER — Telehealth: Payer: Self-pay

## 2023-11-30 NOTE — Transitions of Care (Post Inpatient/ED Visit) (Signed)
 Transition of Care week 2  Visit Note  11/30/2023  Name: Joseph Grimes MRN: [id number]          DOB: [birth year]  Situation: Patient enrolled in [facility] 30-day program. Visit completed with patient by telephone.   Background: TOC RN following patient who discharged from [facility] 11/20/23 Hyperkalemia r/t foot ulcer   Initial Transition Care Management Follow-up Telephone Call    Past Medical History:  Diagnosis Date   CKD (chronic kidney disease) stage 3, GFR 30-59 ml/min (HCC) 10/11/2018   Coronary artery disease 10/11/2018   Diabetes (HCC)    Diabetes mellitus type 2, controlled, with complications (HCC) 10/11/2018   Essential hypertension 10/11/2018   GERD (gastroesophageal reflux disease)    Heart attack (HCC) 2006   stent placed in right coronary artery   High cholesterol    History of anemia 10/11/2018   History of low potassium    Low hemoglobin    Mixed hyperlipidemia 10/11/2018   Peripheral vascular disease (HCC) 10/11/2018   Stroke (HCC)    Tobacco dependence 10/11/2018    Assessment: Patient Reported Symptoms: Cognitive Cognitive Status: Normal speech and language skills, Alert and oriented to person, place, and time      Neurological Neurological Review of Symptoms: No symptoms reported Neurological Conditions: Seizures Neurological Management Strategies: Medication therapy Neurological Self-Management Outcome: 4 (good) Neurological Comment: patient denies any recent seizure activity  HEENT HEENT Symptoms Reported: No symptoms reported      Cardiovascular Cardiovascular Symptoms Reported: No symptoms reported    Respiratory Respiratory Symptoms Reported: No symptoms reported    Endocrine Patient reports the following symptoms related to hypoglycemia or hyperglycemia : No symptoms reported Is patient diabetic?: Yes Is patient checking blood sugars at home?: Yes Endocrine Conditions: Diabetes Endocrine Management Strategies: Diet modification, Medication  therapy Endocrine Self-Management Outcome: 4 (good) Endocrine Comment: A1C per record is 7.1  Gastrointestinal Gastrointestinal Symptoms Reported: No symptoms reported      Genitourinary Genitourinary Symptoms Reported: No symptoms reported    Integumentary Integumentary Symptoms Reported: Wound Additional Integumentary Details: open right foot ulcer Skin Conditions: Wound Skin Management Strategies: Medication therapy Skin Self-Management Outcome: 4 (good) Skin Comment: [facility] comes 3x/week and sees Dr Adora Aland at wound clinic with last wound clinic visit 11/27/23  Musculoskeletal Musculoskelatal Symptoms Reviewed: No symptoms reported        Psychosocial Psychosocial Symptoms Reported: No symptoms reported         There were no vitals filed for this visit.  Medications Reviewed Today     Reviewed by Sharmaine Dearth, RN (Registered Nurse) on 11/30/23 at [phone]  Med List Status: <None>   Medication Order Taking? Sig Documenting Provider Last Dose Status Informant  Accu-Chek Softclix Lancets lancets [id number] Yes  [provider] Taking Active   albuterol (VENTOLIN HFA) 108 (90 Base) MCG/ACT inhaler [id number] Yes Inhale into the lungs every 6 (six) hours as needed for wheezing or shortness of breath. [provider] Taking Active            Med Note Welford Haley, CRYSTAL L   Thu May 18, 2023  9:52 AM) 2 puffs  aspirin  81 MG tablet [id number] Yes Take 1 tablet (81 mg total) by mouth daily. Alexander, Natalie, DO Taking Active   blood glucose meter kit and supplies [id number] Yes Dispense based on patient and insurance preference. Use up to four times daily as directed. (FOR ICD-10 E10.9, E11.9). Alexander, Natalie, DO Taking Active  Med Note (LLOYD, CRYSTAL L   Thu May 18, 2023  9:46 AM) Checks DAILY  ( is not checking 4 x day)  Blood Glucose Monitoring Suppl DEVI [id number] Yes 1 each by Does not apply route 2 (two) times daily as needed. May  substitute to any manufacturer covered by patient's insurance. Cherre Cornish, NP Taking Active   cephALEXin  (KEFLEX ) 500 MG capsule [id number] Yes Take 500 mg by mouth 3 (three) times daily. [provider] Taking Active   cilostazol  (PLETAL ) 100 MG tablet [id number] Yes TAKE 1 TABLET TWICE DAILY Cherre Cornish, NP Taking Active   collagenase  (SANTYL ) 250 UNIT/GM ointment [id number] Yes Apply 1 Application topically daily. Cherre Cornish, NP Taking Active   dapagliflozin  propanediol (FARXIGA ) 10 MG TABS tablet [id number] No Take 1 tablet (10 mg total) by mouth daily before breakfast.  Patient not taking: Reported on 11/30/2023   Cherre Cornish, NP Not Taking Active            Med Note (TOUSEY, LAINE M   Wed May 24, 2023  3:35 PM) 05/24/23: Reports during [facility] call today he "has been out of this medication for awhile;" reports he was receiving this medication from established PAP, but "misplaced" 2 of the bottles in a recent "move;" states he has not been taking recently- patient unsure of drug manufacturer-- stated "my doctor arranged it for me"   divalproex (DEPAKOTE ER) 500 MG 24 hr tablet [id number] Yes Take 500 mg by mouth 2 (two) times daily. Per hospital discharge 11/20/23 now 2x/day [provider] Taking Active   Glucose Blood (BLOOD GLUCOSE TEST STRIPS) STRP [id number] Yes 1 each by In Vitro route 2 (two) times daily. May substitute to any manufacturer covered by patient's insurance. Cherre Cornish, NP Taking Active            Med Note Welford Haley, CRYSTAL L   Thu May 18, 2023  9:48 AM) Uses QD  Glucose Blood (BLOOD GLUCOSE TEST STRIPS) STRP [id number] Yes 1 each by In Vitro route 2 (two) times daily as needed. May substitute to any manufacturer covered by patient's insurance. Cherre Cornish, NP Taking Active   insulin  glargine, 2 Unit Dial, (TOUJEO  MAX SOLOSTAR) 300 UNIT/ML Solostar Pen [id number] Yes Inject 16 Units into the skin at bedtime. Increase by 4 units every 4 days until fasting (morning) blood  sugar is <150, then stay at that dose.  Patient taking differently: Inject 35 Units into the skin at bedtime. Increase by 4 units every 4 days until fasting (morning) blood sugar is <150, then stay at that dose. (Taking 35 units daily as of 06/23/21)   Alexander, Natalie, DO Taking Active            Med Note (TOUSEY, LAINE M   Wed May 24, 2023  3:37 PM) 05/24/23: reports during [facility] call today that he is currently taking 20-22 Units at bedtime   Insulin  Pen Needle 31G X 8 MM MISC [id number] Yes Use to inject insulin  daily Adela Holter, DO Taking Active   Lancet Device MISC [id number] Yes 1 each by Does not apply route 2 (two) times daily. May substitute to any manufacturer covered by patient's insurance. Cherre Cornish, NP Taking Active   levETIRAcetam (KEPPRA) 500 MG tablet [id number] Yes Take 500 mg by mouth 2 (two) times daily. [provider] Taking Active   metoprolol succinate (TOPROL-XL) 25 MG 24 hr tablet [id number] No Take 25 mg by mouth daily.  Patient not taking: Reported on 11/21/2023   [provider] Not Taking Active Self           Med Note (TOUSEY, LAINE M   Wed May 24, 2023  3:38 PM) 05/24/23: reports during [facility] call today: "this has been stopped for awhile now;" unable to tell me who stopped this medication  metroNIDAZOLE (FLAGYL) 500 MG tablet [id number] Yes Take 500 mg by mouth 3 (three) times daily. [provider] Taking Active   Oxycodone  HCl 10 MG TABS [id number] Yes Take 0.5-1 tablets (5-10 mg total) by mouth every 6 (six) hours as needed. #40 tabs to last 90 days. Cherre Cornish, NP Taking Active   pantoprazole  (PROTONIX ) 40 MG tablet [id number] Yes TAKE 1 TABLET EVERY DAY  Patient taking differently: Take 40 mg by mouth daily. Every 3 days per hospital discharge 4/14 [facility]   Delmar, Kenney Peacemaker, NP Taking Active   pregabalin  (LYRICA ) 100 MG capsule [id number] Yes Take 1 capsule (100 mg total) by mouth 3 (three) times daily.  Patient taking differently: Take 50 mg  by mouth 2 (two) times daily. Per [facility] hospital d/c 50mg  2x/day   Cherre Cornish, NP Taking Active   rosuvastatin  (CRESTOR ) 40 MG tablet [id number] Yes TAKE 1 TABLET EVERY DAY Cherre Cornish, NP Taking Active   SSD 1 % cream [id number] No Apply 1 Application topically daily.  Patient not taking: Reported on 11/30/2023   [provider] Not Taking Active   torsemide (DEMADEX) 20 MG tablet [id number] No Take 20 mg by mouth daily.  Patient not taking: Reported on 11/30/2023   [provider] Not Taking Active            Med Note [facility], Jerusalen Mateja A   Thu Nov 30, 2023  9:45 AM) Patient states at his 11/27/23 appointment with Dr Adora Aland he was told not to take the Torsemide            Recommendation:   PCP Follow-up 12/06/23 AM  Follow Up Plan:   Telephone follow up appointment date/time:  12/06/23 3pm  Tonia Frankel RN, CCM [place]  VBCI-Population Health RN Care Manager [phone]

## 2023-11-30 NOTE — Patient Instructions (Signed)
 Visit Information  Thank you for taking time to visit with me today. Please don't hesitate to contact me if I can be of assistance to you before our next scheduled telephone appointment.  Our next appointment is by telephone on 12/06/23 at 3pm  Following is a copy of your care plan:   Goals Addressed             This Visit's Progress    VBCI Transitions of Care (TOC) Care Plan       Problems:  Recent Hospitalization for treatment of DMII,  Hyperkalemia, rt heel ulcer  Medication management barrier Check blood sugars as per order and take medications as per Dr ordered  Goal:  Over the next 30 days, the patient will not experience hospital readmission  Interventions:  Transitions of Care: Doctor Visits  - discussed the importance of doctor visits - seen by Dr Adora Aland at wound clinic 11/27/23 Post discharge activity limitations prescribed by provider reviewed  Diabetes Interventions: Assessed patient's understanding of A1c goal: <6.5% Reviewed medications with patient and discussed importance of medication adherence Reviewed scheduled/upcoming provider appointments including: [id number] with PCP, 12/05/23 with new MD related to seizures  7.1 A1C noted in record  Lab Results  Component Value Date   HGBA1C 7.4 (A) 10/03/2023   HGBA1C 7.4 (A) 10/03/2023    Patient Self Care Activities:  Attend all scheduled provider appointments Call pharmacy for medication refills 3-7 days in advance of running out of medications Call provider office for new concerns or questions  Notify RN Care Manager of [facility] call rescheduling needs Participate in Transition of Care Program/Attend TOC scheduled calls Take medications as prescribed   check blood sugar at prescribed times: 4 times daily check feet daily for cuts, sores or redness set a realistic goal wear comfortable, cotton socks  Plan:  Next PCP appointment scheduled for: 12/06/23 Telephone follow up appointment with care management team  member scheduled for:  12/06/23 3pm post PCP appointment        Patient verbalizes understanding of instructions and care plan provided today and agrees to view in MyChart. Active MyChart status and patient understanding of how to access instructions and care plan via MyChart confirmed with patient.     Telephone follow up appointment with care management team member scheduled for:12/06/23 3pm The patient has been provided with contact information for the care management team and has been advised to call with any health related questions or concerns.   Please call the care guide team at [phone] if you need to cancel or reschedule your appointment.   Please call the Suicide and Crisis Lifeline: 988 call the [place]  National Suicide Prevention Lifeline: (518)044-6508 or TTY: 334-469-4929 TTY 6142625330) to talk to a trained counselor call 911 if you are experiencing a Mental Health or Behavioral Health Crisis or need someone to talk to.  Tonia Frankel RN, CCM [place]  VBCI-Population Health RN Care Manager [phone]

## 2023-12-01 DIAGNOSIS — I5033 Acute on chronic diastolic (congestive) heart failure: Secondary | ICD-10-CM | POA: Diagnosis not present

## 2023-12-01 DIAGNOSIS — L89610 Pressure ulcer of right heel, unstageable: Secondary | ICD-10-CM | POA: Diagnosis not present

## 2023-12-01 DIAGNOSIS — T8781 Dehiscence of amputation stump: Secondary | ICD-10-CM | POA: Diagnosis not present

## 2023-12-01 DIAGNOSIS — E11622 Type 2 diabetes mellitus with other skin ulcer: Secondary | ICD-10-CM | POA: Diagnosis not present

## 2023-12-01 DIAGNOSIS — L97828 Non-pressure chronic ulcer of other part of left lower leg with other specified severity: Secondary | ICD-10-CM | POA: Diagnosis not present

## 2023-12-01 DIAGNOSIS — I82441 Acute embolism and thrombosis of right tibial vein: Secondary | ICD-10-CM | POA: Diagnosis not present

## 2023-12-01 DIAGNOSIS — L97518 Non-pressure chronic ulcer of other part of right foot with other specified severity: Secondary | ICD-10-CM | POA: Diagnosis not present

## 2023-12-01 DIAGNOSIS — L03115 Cellulitis of right lower limb: Secondary | ICD-10-CM | POA: Diagnosis not present

## 2023-12-01 DIAGNOSIS — E11621 Type 2 diabetes mellitus with foot ulcer: Secondary | ICD-10-CM | POA: Diagnosis not present

## 2023-12-04 DIAGNOSIS — I5042 Chronic combined systolic (congestive) and diastolic (congestive) heart failure: Secondary | ICD-10-CM | POA: Diagnosis not present

## 2023-12-04 DIAGNOSIS — E1169 Type 2 diabetes mellitus with other specified complication: Secondary | ICD-10-CM | POA: Diagnosis not present

## 2023-12-04 DIAGNOSIS — R9439 Abnormal result of other cardiovascular function study: Secondary | ICD-10-CM | POA: Diagnosis not present

## 2023-12-04 DIAGNOSIS — I739 Peripheral vascular disease, unspecified: Secondary | ICD-10-CM | POA: Diagnosis not present

## 2023-12-04 DIAGNOSIS — Z1330 Encounter for screening examination for mental health and behavioral disorders, unspecified: Secondary | ICD-10-CM | POA: Diagnosis not present

## 2023-12-04 DIAGNOSIS — I2510 Atherosclerotic heart disease of native coronary artery without angina pectoris: Secondary | ICD-10-CM | POA: Diagnosis not present

## 2023-12-04 DIAGNOSIS — Z794 Long term (current) use of insulin: Secondary | ICD-10-CM | POA: Diagnosis not present

## 2023-12-04 DIAGNOSIS — I10 Essential (primary) hypertension: Secondary | ICD-10-CM | POA: Diagnosis not present

## 2023-12-04 DIAGNOSIS — E785 Hyperlipidemia, unspecified: Secondary | ICD-10-CM | POA: Diagnosis not present

## 2023-12-05 ENCOUNTER — Ambulatory Visit: Admitting: Neurology

## 2023-12-06 ENCOUNTER — Ambulatory Visit: Admitting: Medical-Surgical

## 2023-12-06 ENCOUNTER — Telehealth: Payer: Self-pay

## 2023-12-06 NOTE — Transitions of Care (Post Inpatient/ED Visit) (Signed)
 Transition of Care week 3  Visit Note  12/06/2023  Name: Joseph Grimes MRN: [id number]          DOB: 11-18-1960  Situation: Patient enrolled in [facility] 30-day program. Visit completed with patient by telephone.   Background: TOC RN following patient who discharged from [facility] 11/20/23 Hyperkalemia r/t foot ulcer. Patient stated Home Health had not been out this week and we placed a call to [facility] and spoke with Kirstie who states they thought patient had wound clinic appointment Monday and patient did not call back nurse who left voicemail for today's visit (patient states he did return the call). Kirstie offered to send a nurse out tomorrow but patient declined and said for nurse to come Friday as previously scheduled. During medication review, patient reported again that he is not taking the Torsemide but cardiology visit note states "continue Torsemide". Conference call was made with patient and message left for clinical staff at office of [facility] cardiology, Dr Terry Ficks. [facility] RN also sent inbasket message to PCP related to Torsemide and patient stating he is out of the Depakote. TOC RN also noted patient missed office visits and patient states he rescheduled them. TOC RN offered to try to help get visits sooner and patient declined.   Initial Transition Care Management Follow-up Telephone Call    Past Medical History:  Diagnosis Date   CKD (chronic kidney disease) stage 3, GFR 30-59 ml/min (HCC) 10/11/2018   Coronary artery disease 10/11/2018   Diabetes (HCC)    Diabetes mellitus type 2, controlled, with complications (HCC) 10/11/2018   Essential hypertension 10/11/2018   GERD (gastroesophageal reflux disease)    Heart attack (HCC) 2006   stent placed in right coronary artery   High cholesterol    History of anemia 10/11/2018   History of low potassium    Low hemoglobin    Mixed hyperlipidemia 10/11/2018   Peripheral vascular disease (HCC) 10/11/2018   Stroke (HCC)    Tobacco dependence 10/11/2018     Assessment: Patient Reported Symptoms: Cognitive        Neurological      HEENT        Cardiovascular   Cardiovascular Conditions: Hypertension  Respiratory Respiratory Symptoms Reported: No symptoms reported    Endocrine Is patient diabetic?: Yes Is patient checking blood sugars at home?: Yes    Gastrointestinal        Genitourinary      Integumentary   Skin Comment: patient states [facility] last visit was 4/25 - call placed to [facility], spoke with kirstie who said she will check on this and make sure a nurse comes - patient said he doesn't want them to come until Friday  Musculoskeletal          Psychosocial           There were no vitals filed for this visit.  Medications Reviewed Today     Reviewed by Sharmaine Dearth, RN (Registered Nurse) on 12/06/23 at 1525  Med List Status: <None>   Medication Order Taking? Sig Documenting Provider Last Dose Status Informant  Accu-Chek Softclix Lancets lancets [id number] Yes  [provider] Taking Active   albuterol (VENTOLIN HFA) 108 (90 Base) MCG/ACT inhaler [id number]  Inhale into the lungs every 6 (six) hours as needed for wheezing or shortness of breath. [provider]  Active            Med Note Welford Haley, CRYSTAL Vernal Gold May 18, 2023  9:52  AM) 2 puffs  aspirin  81 MG tablet [id number] Yes Take 1 tablet (81 mg total) by mouth daily. Alexander, Natalie, DO Taking Active   blood glucose meter kit and supplies [id number] Yes Dispense based on patient and insurance preference. Use up to four times daily as directed. (FOR ICD-10 E10.9, E11.9). Alexander, Natalie, DO Taking Active            Med Note Welford Haley, CRYSTAL L   Thu May 18, 2023  9:46 AM) Checks DAILY  ( is not checking 4 x day)  Blood Glucose Monitoring Suppl DEVI [id number] Yes 1 each by Does not apply route 2 (two) times daily as needed. May substitute to any manufacturer covered by patient's insurance. Cherre Cornish, NP Taking Active    cilostazol  (PLETAL ) 100 MG tablet [id number] Yes TAKE 1 TABLET TWICE DAILY Cherre Cornish, NP Taking Active   collagenase  (SANTYL ) 250 UNIT/GM ointment [id number] Yes Apply 1 Application topically daily. Cherre Cornish, NP Taking Active   dapagliflozin  propanediol (FARXIGA ) 10 MG TABS tablet [id number] No Take 1 tablet (10 mg total) by mouth daily before breakfast.  Patient not taking: Reported on 12/06/2023   Cherre Cornish, NP Not Taking Active            Med Note (TOUSEY, LAINE M   Wed May 24, 2023  3:35 PM) 05/24/23: Reports during [facility] call today he "has been out of this medication for awhile;" reports he was receiving this medication from established PAP, but "misplaced" 2 of the bottles in a recent "move;" states he has not been taking recently- patient unsure of drug manufacturer-- stated "my doctor arranged it for me"   divalproex (DEPAKOTE ER) 500 MG 24 hr tablet [id number] No Take 500 mg by mouth 2 (two) times daily. Per hospital discharge 11/20/23 now 2x/day  Patient not taking: Reported on 12/06/2023   [provider] Not Taking Active            Med Note [facility], Wali Reinheimer A   Wed Dec 06, 2023  3:22 PM) Patient states he is out of this medication - Inbasket message sent to PCP, Cherre Cornish   Glucose Blood (BLOOD GLUCOSE TEST STRIPS) STRP [id number] Yes 1 each by In Vitro route 2 (two) times daily. May substitute to any manufacturer covered by patient's insurance. Cherre Cornish, NP Taking Active            Med Note Welford Haley, CRYSTAL L   Thu May 18, 2023  9:48 AM) Uses QD  Glucose Blood (BLOOD GLUCOSE TEST STRIPS) STRP [id number] Yes 1 each by In Vitro route 2 (two) times daily as needed. May substitute to any manufacturer covered by patient's insurance. Cherre Cornish, NP Taking Active   insulin  glargine, 2 Unit Dial, (TOUJEO  MAX SOLOSTAR) 300 UNIT/ML Solostar Pen [id number] Yes Inject 16 Units into the skin at bedtime. Increase by 4 units every 4 days until fasting (morning) blood sugar is <150, then  stay at that dose.  Patient taking differently: Inject 35 Units into the skin at bedtime. Increase by 4 units every 4 days until fasting (morning) blood sugar is <150, then stay at that dose. (Taking 35 units daily as of 06/23/21)   Melodi Sprung, DO Taking Active            Med Note (TOUSEY, LAINE M   Wed May 24, 2023  3:37 PM) 05/24/23: reports during [facility] call today that he is currently taking 20-22 Units at bedtime   Insulin  Pen Needle 31G X 8  MM MISC [id number] Yes Use to inject insulin  daily Adela Holter, DO Taking Active   Lancet Device MISC [id number] Yes 1 each by Does not apply route 2 (two) times daily. May substitute to any manufacturer covered by patient's insurance. Cherre Cornish, NP Taking Active   levETIRAcetam (KEPPRA) 500 MG tablet [id number] Yes Take 500 mg by mouth 2 (two) times daily. [provider] Taking Active   metoprolol succinate (TOPROL-XL) 25 MG 24 hr tablet [id number] No Take 25 mg by mouth daily.  Patient not taking: Reported on 11/21/2023   [provider] Not Taking Active Self           Med Note (TOUSEY, LAINE M   Wed May 24, 2023  3:38 PM) 05/24/23: reports during [facility] call today: "this has been stopped for awhile now;" unable to tell me who stopped this medication  Oxycodone  HCl 10 MG TABS [id number] Yes Take 0.5-1 tablets (5-10 mg total) by mouth every 6 (six) hours as needed. #40 tabs to last 90 days. Cherre Cornish, NP Taking Active   pantoprazole  (PROTONIX ) 40 MG tablet [id number] Yes TAKE 1 TABLET EVERY DAY  Patient taking differently: Take 40 mg by mouth daily. Every 3 days per hospital discharge 4/14 [facility]   Ollie, Kenney Peacemaker, NP Taking Active   pregabalin  (LYRICA ) 100 MG capsule [id number] Yes Take 1 capsule (100 mg total) by mouth 3 (three) times daily.  Patient taking differently: Take 50 mg by mouth 2 (two) times daily. Per [facility] hospital d/c 50mg  2x/day   Cherre Cornish, NP Taking Active   rosuvastatin  (CRESTOR ) 40 MG tablet [id number] Yes TAKE 1  TABLET EVERY DAY Cherre Cornish, NP Taking Active   SSD 1 % cream [id number] No Apply 1 Application topically daily.  Patient not taking: Reported on 12/06/2023   [provider] Not Taking Active   torsemide (DEMADEX) 20 MG tablet [id number] No Take 20 mg by mouth daily.  Patient not taking: Reported on 12/06/2023   [provider] Not Taking Active            Med Note [facility], Aleg[place] Dominique A   Wed Dec 06, 2023  3:24 PM) Patient states he is still not taking this but cardiology visit note from 12/04/23 states continue torsemide - notified office of Dr Ruthine Cowper              Recommendation:   PCP Follow-up regarding Depakote Home Health requests: Next visit Friday 12/08/23  Follow Up Plan:   Telephone follow up appointment date/time:  12/14/23 2pm   Tonia Frankel RN, CCM [place]  VBCI-Population Health RN Care Manager [phone]

## 2023-12-07 ENCOUNTER — Other Ambulatory Visit: Payer: Self-pay | Admitting: Medical-Surgical

## 2023-12-07 MED ORDER — DIVALPROEX SODIUM ER 500 MG PO TB24: 500.0000 mg | ORAL_TABLET | Freq: Two times a day (BID) | ORAL | 1 refills | Status: AC

## 2023-12-08 DIAGNOSIS — I5033 Acute on chronic diastolic (congestive) heart failure: Secondary | ICD-10-CM | POA: Diagnosis not present

## 2023-12-08 DIAGNOSIS — L89610 Pressure ulcer of right heel, unstageable: Secondary | ICD-10-CM | POA: Diagnosis not present

## 2023-12-08 DIAGNOSIS — L97518 Non-pressure chronic ulcer of other part of right foot with other specified severity: Secondary | ICD-10-CM | POA: Diagnosis not present

## 2023-12-08 DIAGNOSIS — L03115 Cellulitis of right lower limb: Secondary | ICD-10-CM | POA: Diagnosis not present

## 2023-12-08 DIAGNOSIS — L97828 Non-pressure chronic ulcer of other part of left lower leg with other specified severity: Secondary | ICD-10-CM | POA: Diagnosis not present

## 2023-12-08 DIAGNOSIS — T8781 Dehiscence of amputation stump: Secondary | ICD-10-CM | POA: Diagnosis not present

## 2023-12-08 DIAGNOSIS — E11621 Type 2 diabetes mellitus with foot ulcer: Secondary | ICD-10-CM | POA: Diagnosis not present

## 2023-12-08 DIAGNOSIS — I82441 Acute embolism and thrombosis of right tibial vein: Secondary | ICD-10-CM | POA: Diagnosis not present

## 2023-12-08 DIAGNOSIS — E11622 Type 2 diabetes mellitus with other skin ulcer: Secondary | ICD-10-CM | POA: Diagnosis not present

## 2023-12-11 DIAGNOSIS — L97511 Non-pressure chronic ulcer of other part of right foot limited to breakdown of skin: Secondary | ICD-10-CM | POA: Diagnosis not present

## 2023-12-11 DIAGNOSIS — E11622 Type 2 diabetes mellitus with other skin ulcer: Secondary | ICD-10-CM | POA: Diagnosis not present

## 2023-12-11 DIAGNOSIS — F17210 Nicotine dependence, cigarettes, uncomplicated: Secondary | ICD-10-CM | POA: Diagnosis not present

## 2023-12-11 DIAGNOSIS — E1152 Type 2 diabetes mellitus with diabetic peripheral angiopathy with gangrene: Secondary | ICD-10-CM | POA: Diagnosis not present

## 2023-12-11 DIAGNOSIS — L97412 Non-pressure chronic ulcer of right heel and midfoot with fat layer exposed: Secondary | ICD-10-CM | POA: Diagnosis not present

## 2023-12-11 DIAGNOSIS — L97821 Non-pressure chronic ulcer of other part of left lower leg limited to breakdown of skin: Secondary | ICD-10-CM | POA: Diagnosis not present

## 2023-12-11 DIAGNOSIS — Z794 Long term (current) use of insulin: Secondary | ICD-10-CM | POA: Diagnosis not present

## 2023-12-11 DIAGNOSIS — L97319 Non-pressure chronic ulcer of right ankle with unspecified severity: Secondary | ICD-10-CM | POA: Diagnosis not present

## 2023-12-11 DIAGNOSIS — E11621 Type 2 diabetes mellitus with foot ulcer: Secondary | ICD-10-CM | POA: Diagnosis not present

## 2023-12-11 DIAGNOSIS — L97919 Non-pressure chronic ulcer of unspecified part of right lower leg with unspecified severity: Secondary | ICD-10-CM | POA: Diagnosis not present

## 2023-12-13 DIAGNOSIS — T8781 Dehiscence of amputation stump: Secondary | ICD-10-CM | POA: Diagnosis not present

## 2023-12-13 DIAGNOSIS — L97828 Non-pressure chronic ulcer of other part of left lower leg with other specified severity: Secondary | ICD-10-CM | POA: Diagnosis not present

## 2023-12-13 DIAGNOSIS — L97518 Non-pressure chronic ulcer of other part of right foot with other specified severity: Secondary | ICD-10-CM | POA: Diagnosis not present

## 2023-12-13 DIAGNOSIS — E11622 Type 2 diabetes mellitus with other skin ulcer: Secondary | ICD-10-CM | POA: Diagnosis not present

## 2023-12-13 DIAGNOSIS — L03115 Cellulitis of right lower limb: Secondary | ICD-10-CM | POA: Diagnosis not present

## 2023-12-13 DIAGNOSIS — I82441 Acute embolism and thrombosis of right tibial vein: Secondary | ICD-10-CM | POA: Diagnosis not present

## 2023-12-13 DIAGNOSIS — L89610 Pressure ulcer of right heel, unstageable: Secondary | ICD-10-CM | POA: Diagnosis not present

## 2023-12-13 DIAGNOSIS — I5033 Acute on chronic diastolic (congestive) heart failure: Secondary | ICD-10-CM | POA: Diagnosis not present

## 2023-12-13 DIAGNOSIS — E11621 Type 2 diabetes mellitus with foot ulcer: Secondary | ICD-10-CM | POA: Diagnosis not present

## 2023-12-14 ENCOUNTER — Other Ambulatory Visit: Payer: Self-pay

## 2023-12-14 NOTE — Transitions of Care (Post Inpatient/ED Visit) (Signed)
 Transition of Care week 3  Visit Note  12/14/2023  Name: Joseph Grimes MRN: [id number]          DOB: 08/06/1961  Situation: Patient enrolled in [facility] 30-day program. Visit completed with patient by telephone.   Background: TOC RN following patient who discharged from [facility] 11/20/23 Hyperkalemia r/t foot ulcer. Patient states he was seen at wound clinic 12/11/23 and states right heel is improving and was told to continue with Santyl , Home health nurse, return in 2 weeks. Patient reviewed medications and states he is taking medication as prescribed and that wound clinic MD told him to restart the Torsemide. Patient states bgm was 101 today  Initial Transition Care Management Follow-up Telephone Call    Past Medical History:  Diagnosis Date   CKD (chronic kidney disease) stage 3, GFR 30-59 ml/min (HCC) 10/11/2018   Coronary artery disease 10/11/2018   Diabetes (HCC)    Diabetes mellitus type 2, controlled, with complications (HCC) 10/11/2018   Essential hypertension 10/11/2018   GERD (gastroesophageal reflux disease)    Heart attack (HCC) 2006   stent placed in right coronary artery   High cholesterol    History of anemia 10/11/2018   History of low potassium    Low hemoglobin    Mixed hyperlipidemia 10/11/2018   Peripheral vascular disease (HCC) 10/11/2018   Stroke (HCC)    Tobacco dependence 10/11/2018    Assessment: Patient Reported Symptoms: call short per patient request who was watching news about new [place].  There were no vitals filed for this visit.  Medications Reviewed Today     Reviewed by Sharmaine Dearth, RN (Registered Nurse) on 12/14/23 at 1421  Med List Status: <None>   Medication Order Taking? Sig Documenting Provider Last Dose Status Informant  Accu-Chek Softclix Lancets lancets [id number] Yes  [provider] Taking Active   albuterol (VENTOLIN HFA) 108 (90 Base) MCG/ACT inhaler [id number] Yes Inhale into the lungs every 6 (six) hours as needed for wheezing or  shortness of breath. [provider] Taking Active            Med Note Welford Haley, CRYSTAL L   Thu May 18, 2023  9:52 AM) 2 puffs  aspirin  81 MG tablet [id number] Yes Take 1 tablet (81 mg total) by mouth daily. Alexander, Natalie, DO Taking Active   blood glucose meter kit and supplies [id number] Yes Dispense based on patient and insurance preference. Use up to four times daily as directed. (FOR ICD-10 E10.9, E11.9). Alexander, Natalie, DO Taking Active            Med Note Welford Haley, CRYSTAL L   Thu May 18, 2023  9:46 AM) Checks DAILY  ( is not checking 4 x day)  Blood Glucose Monitoring Suppl DEVI [id number] Yes 1 each by Does not apply route 2 (two) times daily as needed. May substitute to any manufacturer covered by patient's insurance. Cherre Cornish, NP Taking Active   cilostazol  (PLETAL ) 100 MG tablet [id number] Yes TAKE 1 TABLET TWICE DAILY Cherre Cornish, NP Taking Active   collagenase  (SANTYL ) 250 UNIT/GM ointment [id number] Yes Apply 1 Application topically daily. Cherre Cornish, NP Taking Active   dapagliflozin  propanediol (FARXIGA ) 10 MG TABS tablet [id number] No Take 1 tablet (10 mg total) by mouth daily before breakfast.  Patient not taking: Reported on 11/30/2023   Cherre Cornish, NP Not Taking Active            Med Note (TOUSEY, LAINE M   Wed May 24, 2023  3:35 PM) 05/24/23: Reports during TOC call today he "has been out of this medication for awhile;" reports he was receiving this medication from established PAP, but "misplaced" 2 of the bottles in a recent "move;" states he has not been taking recently- patient unsure of drug manufacturer-- stated "my doctor arranged it for me"   divalproex  (DEPAKOTE  ER) 500 MG 24 hr tablet [id number] Yes Take 1 tablet (500 mg total) by mouth 2 (two) times daily. Cherre Cornish, NP Taking Active   Glucose Blood (BLOOD GLUCOSE TEST STRIPS) STRP [id number] Yes 1 each by In Vitro route 2 (two) times daily. May substitute to any manufacturer covered by patient's insurance.  Cherre Cornish, NP Taking Active            Med Note Welford Haley, CRYSTAL L   Thu May 18, 2023  9:48 AM) Uses QD  Glucose Blood (BLOOD GLUCOSE TEST STRIPS) STRP [id number] Yes 1 each by In Vitro route 2 (two) times daily as needed. May substitute to any manufacturer covered by patient's insurance. Cherre Cornish, NP Taking Active   insulin  glargine, 2 Unit Dial, (TOUJEO  MAX SOLOSTAR) 300 UNIT/ML Solostar Pen [id number] Yes Inject 16 Units into the skin at bedtime. Increase by 4 units every 4 days until fasting (morning) blood sugar is <150, then stay at that dose.  Patient taking differently: Inject 35 Units into the skin at bedtime. 12/14/23 patient reports taking 14 units   Alexander, Natalie, DO Taking Active            Med Note (TOUSEY, LAINE M   Wed May 24, 2023  3:37 PM) 05/24/23: reports during [facility] call today that he is currently taking 20-22 Units at bedtime   Insulin  Pen Needle 31G X 8 MM MISC [id number] Yes Use to inject insulin  daily Adela Holter, DO Taking Active   Lancet Device MISC [id number] Yes 1 each by Does not apply route 2 (two) times daily. May substitute to any manufacturer covered by patient's insurance. Cherre Cornish, NP Taking Active   levETIRAcetam (KEPPRA) 500 MG tablet [id number] Yes Take 500 mg by mouth 2 (two) times daily. [provider] Taking Active   metoprolol succinate (TOPROL-XL) 25 MG 24 hr tablet [id number] No Take 25 mg by mouth daily.  Patient not taking: Reported on 11/21/2023   [provider] Not Taking Active Self           Med Note (TOUSEY, LAINE M   Wed May 24, 2023  3:38 PM) 05/24/23: reports during [facility] call today: "this has been stopped for awhile now;" unable to tell me who stopped this medication  Oxycodone  HCl 10 MG TABS [id number] Yes Take 0.5-1 tablets (5-10 mg total) by mouth every 6 (six) hours as needed. #40 tabs to last 90 days. Cherre Cornish, NP Taking Active   pantoprazole  (PROTONIX ) 40 MG tablet [id number] Yes TAKE 1 TABLET EVERY DAY  Patient  taking differently: Take 40 mg by mouth daily. Every 3 days per hospital discharge 4/14 [facility]   [place], Kenney Peacemaker, NP Taking Active   pregabalin  (LYRICA ) 100 MG capsule [id number] Yes Take 1 capsule (100 mg total) by mouth 3 (three) times daily.  Patient taking differently: Take 50 mg by mouth 2 (two) times daily. Per [facility] hospital d/c 50mg  2x/day   Cherre Cornish, NP Taking Active   rosuvastatin  (CRESTOR ) 40 MG tablet [id number] Yes TAKE 1 TABLET EVERY DAY Jessup, Joy, NP Taking Active   SSD 1 % cream [id number] No Apply 1 Application topically daily.  Patient not taking: Reported on 11/30/2023   [provider] Not Taking Active   torsemide (DEMADEX) 20 MG tablet [id number] Yes Take 20 mg by mouth daily. Patient states wound doctor told him to restart at 12/11/23 appointment [provider] Taking Active            Med Note [facility], Avian [place] A   Wed Dec 06, 2023  3:24 PM) Patient states he is still not taking this but cardiology visit note from 12/04/23 states continue torsemide - notified office of Dr Ruthine Cowper              Recommendation:   Keep PCP Follow-up as scheduled  Continue to follow with wound clinic as directed  Follow Up Plan:  12/21/23 1pm   Tonia Frankel RN, CCM [place]  VBCI-Population Health RN Care Manager [phone]

## 2023-12-15 DIAGNOSIS — L89610 Pressure ulcer of right heel, unstageable: Secondary | ICD-10-CM | POA: Diagnosis not present

## 2023-12-15 DIAGNOSIS — E11621 Type 2 diabetes mellitus with foot ulcer: Secondary | ICD-10-CM | POA: Diagnosis not present

## 2023-12-15 DIAGNOSIS — I5033 Acute on chronic diastolic (congestive) heart failure: Secondary | ICD-10-CM | POA: Diagnosis not present

## 2023-12-15 DIAGNOSIS — I82441 Acute embolism and thrombosis of right tibial vein: Secondary | ICD-10-CM | POA: Diagnosis not present

## 2023-12-15 DIAGNOSIS — L97518 Non-pressure chronic ulcer of other part of right foot with other specified severity: Secondary | ICD-10-CM | POA: Diagnosis not present

## 2023-12-15 DIAGNOSIS — L03115 Cellulitis of right lower limb: Secondary | ICD-10-CM | POA: Diagnosis not present

## 2023-12-15 DIAGNOSIS — T8781 Dehiscence of amputation stump: Secondary | ICD-10-CM | POA: Diagnosis not present

## 2023-12-15 DIAGNOSIS — E11622 Type 2 diabetes mellitus with other skin ulcer: Secondary | ICD-10-CM | POA: Diagnosis not present

## 2023-12-15 DIAGNOSIS — L97828 Non-pressure chronic ulcer of other part of left lower leg with other specified severity: Secondary | ICD-10-CM | POA: Diagnosis not present

## 2023-12-18 ENCOUNTER — Other Ambulatory Visit: Payer: Self-pay | Admitting: Medical-Surgical

## 2023-12-18 NOTE — Telephone Encounter (Signed)
 I'm not showing these lancets or test strips.

## 2023-12-20 DIAGNOSIS — I82441 Acute embolism and thrombosis of right tibial vein: Secondary | ICD-10-CM | POA: Diagnosis not present

## 2023-12-20 DIAGNOSIS — E11621 Type 2 diabetes mellitus with foot ulcer: Secondary | ICD-10-CM | POA: Diagnosis not present

## 2023-12-20 DIAGNOSIS — L97828 Non-pressure chronic ulcer of other part of left lower leg with other specified severity: Secondary | ICD-10-CM | POA: Diagnosis not present

## 2023-12-20 DIAGNOSIS — T8781 Dehiscence of amputation stump: Secondary | ICD-10-CM | POA: Diagnosis not present

## 2023-12-20 DIAGNOSIS — E11622 Type 2 diabetes mellitus with other skin ulcer: Secondary | ICD-10-CM | POA: Diagnosis not present

## 2023-12-20 DIAGNOSIS — L97518 Non-pressure chronic ulcer of other part of right foot with other specified severity: Secondary | ICD-10-CM | POA: Diagnosis not present

## 2023-12-20 DIAGNOSIS — L03115 Cellulitis of right lower limb: Secondary | ICD-10-CM | POA: Diagnosis not present

## 2023-12-20 DIAGNOSIS — L89610 Pressure ulcer of right heel, unstageable: Secondary | ICD-10-CM | POA: Diagnosis not present

## 2023-12-20 DIAGNOSIS — I5033 Acute on chronic diastolic (congestive) heart failure: Secondary | ICD-10-CM | POA: Diagnosis not present

## 2023-12-21 ENCOUNTER — Telehealth: Payer: Self-pay

## 2023-12-21 DIAGNOSIS — E08621 Diabetes mellitus due to underlying condition with foot ulcer: Secondary | ICD-10-CM

## 2023-12-21 NOTE — Transitions of Care (Post Inpatient/ED Visit) (Signed)
 Transition of Care Week #5 Closure Note  Visit Note  12/21/2023  Name: Joseph Grimes MRN: [id number]          DOB: 1960/12/31  Situation: Patient enrolled in [facility] 30-day program. Visit completed with patient by telephone.   Background: TOC RN has followed patient who discharged from [facility] 11/20/23 Hyperkalemia r/t foot ulcer. Patient continues with home health nurse visits 2x/week from [facility] and goes to wound center ever other Monday. Patient states he has appointment with vascular at [facility] 12/25/23 and with PCP, Cherre Cornish 5/20, and new patient appointment with Camara, Amadou 02/22/24 - referral for seizure disorder - patient denies any recent seizure activity. Patient agreeable to [facility] program closure with referral to CCM.   Initial Transition Care Management Follow-up Telephone Call    Past Medical History:  Diagnosis Date   CKD (chronic kidney disease) stage 3, GFR 30-59 ml/min (HCC) 10/11/2018   Coronary artery disease 10/11/2018   Diabetes (HCC)    Diabetes mellitus type 2, controlled, with complications (HCC) 10/11/2018   Essential hypertension 10/11/2018   GERD (gastroesophageal reflux disease)    Heart attack (HCC) 2006   stent placed in right coronary artery   High cholesterol    History of anemia 10/11/2018   History of low potassium    Low hemoglobin    Mixed hyperlipidemia 10/11/2018   Peripheral vascular disease (HCC) 10/11/2018   Stroke (HCC)    Tobacco dependence 10/11/2018    Assessment: Patient Reported Symptoms: Cognitive Cognitive Status: Alert and oriented to person, place, and time, Normal speech and language skills Cognitive/Intellectual Conditions Management [RPT]: None reported or documented in medical history or problem list      Neurological Neurological Review of Symptoms: No symptoms reported Neurological Conditions: Seizures Neurological Comment: patient denies any seizure activity in the past couple of month  HEENT HEENT Symptoms Reported: No symptoms  reported      Cardiovascular Cardiovascular Symptoms Reported: No symptoms reported    Respiratory Respiratory Symptoms Reported: No symptoms reported    Endocrine Patient reports the following symptoms related to hypoglycemia or hyperglycemia : No symptoms reported Is patient diabetic?: Yes Is patient checking blood sugars at home?: Yes Endocrine Conditions: Diabetes Endocrine Management Strategies: Medication therapy  Gastrointestinal Gastrointestinal Symptoms Reported: No symptoms reported      Genitourinary Genitourinary Symptoms Reported: No symptoms reported    Integumentary Integumentary Symptoms Reported: Wound Additional Integumentary Details: right heel with ulcer has Home Health nurse 2x/week and goes to wound center every other Monday - , 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  Medications Reviewed Today     Reviewed by Sharmaine Dearth, RN (Registered Nurse) on 12/21/23 at 1311  Med List Status: <None>   Medication Order Taking? Sig Documenting Provider Last Dose Status Informant  albuterol (VENTOLIN HFA) 108 (90 Base) MCG/ACT inhaler [id number] Yes Inhale into the lungs every 6 (six) hours as needed for wheezing or shortness of breath. [provider] Taking Active            Med Note (LLOYD, CRYSTAL L  Thu May 18, 2023  9:52 AM) 2 puffs  aspirin  81 MG tablet [id number] Yes Take 1 tablet (81 mg total) by mouth daily. Alexander, Natalie, DO Taking Active   blood glucose meter kit and supplies [id number] Yes Dispense based on patient and insurance preference. Use up  to four times daily as directed. (FOR ICD-10 E10.9, E11.9). Alexander, Natalie, DO Taking Active            Med Note Welford Haley, CRYSTAL L   Thu May 18, 2023  9:46 AM) Checks DAILY  ( is not checking 4 x day)  Blood Glucose Monitoring Suppl DEVI [id number] Yes 1 each by Does not apply route 2 (two) times daily as needed. May substitute to any manufacturer covered by patient's insurance. Cherre Cornish, NP Taking Active   cilostazol  (PLETAL ) 100 MG tablet [id number] Yes TAKE 1 TABLET TWICE DAILY Cherre Cornish, NP Taking Active   collagenase  (SANTYL ) 250 UNIT/GM ointment [id number] Yes Apply 1 Application topically daily. Cherre Cornish, NP Taking Active   dapagliflozin  propanediol (FARXIGA ) 10 MG TABS tablet [id number] No Take 1 tablet (10 mg total) by mouth daily before breakfast.  Patient not taking: Reported on 12/21/2023   Cherre Cornish, NP Not Taking Active            Med Note (TOUSEY, LAINE M   Wed May 24, 2023  3:35 PM) 05/24/23: Reports during [facility] call today he "has been out of this medication for awhile;" reports he was receiving this medication from established PAP, but "misplaced" 2 of the bottles in a recent "move;" states he has not been taking recently- patient unsure of drug manufacturer-- stated "my doctor arranged it for me"   divalproex  (DEPAKOTE  ER) 500 MG 24 hr tablet [id number] Yes Take 1 tablet (500 mg total) by mouth 2 (two) times daily. Cherre Cornish, NP Taking Active   Glucose Blood (BLOOD GLUCOSE TEST STRIPS) STRP [id number] Yes 1 each by In Vitro route 2 (two) times daily as needed. May substitute to any manufacturer covered by patient's insurance. Cherre Cornish, NP Taking Active   Glucose Blood (BLOOD GLUCOSE TEST STRIPS) STRP [id number] Yes TEST BLOOD SUGAR TWICE DAILY AS NEEDED Cherre Cornish, NP Taking Active   insulin  glargine, 2 Unit Dial, (TOUJEO  MAX SOLOSTAR) 300 UNIT/ML Solostar Pen [id number] Yes Inject 16 Units into the skin at bedtime. Increase by 4 units every 4 days until fasting (morning)  blood sugar is <150, then stay at that dose.  Patient taking differently: Inject 35 Units into the skin at bedtime. 12/14/23 patient reports taking 14 units   Alexander, Natalie, DO Taking Active            Med Note (TOUSEY, LAINE M   Wed May 24, 2023  3:37 PM) 05/24/23: reports during [facility] call today that he is currently taking 20-22 Units at bedtime   Insulin  Pen Needle 31G X 8 MM MISC [id number] Yes Use to inject insulin  daily Adela Holter, DO Taking Active   Lancet Device MISC [id number] Yes 1 each by Does not apply route 2 (two) times daily. May substitute to any manufacturer covered by patient's insurance. Cherre Cornish, NP Taking Active   levETIRAcetam (KEPPRA) 500 MG tablet [id number] Yes Take 500 mg by mouth 2 (two) times daily. [provider] Taking Active   metoprolol succinate (TOPROL-XL) 25 MG 24 hr tablet [id number] No Take 25 mg by mouth daily.  Patient not taking: Reported on 11/21/2023   [provider] Not Taking Active Self  Med Note (TOUSEY, LAINE M   Wed May 24, 2023  3:38 PM) 05/24/23: reports during [facility] call today: "this has been stopped for awhile now;" unable to tell me who stopped this medication  Oxycodone  HCl 10 MG TABS [id number] Yes Take 0.5-1 tablets (5-10 mg total) by mouth every 6 (six) hours as needed. #40 tabs to last 90 days. Cherre Cornish, NP Taking Active   pantoprazole  (PROTONIX ) 40 MG tablet [id number] Yes TAKE 1 TABLET EVERY DAY  Patient taking differently: Take 40 mg by mouth daily. Every 3 days per hospital discharge 4/14 [facility]   Brooks, Kenney Peacemaker, NP Taking Active   pregabalin  (LYRICA ) 100 MG capsule [id number] Yes Take 1 capsule (100 mg total) by mouth 3 (three) times daily.  Patient taking differently: Take 50 mg by mouth 2 (two) times daily. Per [facility] hospital d/c 50mg  2x/day   Cherre Cornish, NP Taking Active   rosuvastatin  (CRESTOR ) 40 MG tablet [id number] Yes TAKE 1 TABLET EVERY DAY Cherre Cornish, NP Taking Active   SSD 1 % cream [id number]  No Apply 1 Application topically daily.  Patient not taking: Reported on 12/21/2023   [provider] Not Taking Active   torsemide (DEMADEX) 20 MG tablet [id number] Yes Take 20 mg by mouth daily. Patient states wound doctor told him to restart at 12/11/23 appointment [provider] Taking Active            Med Note [facility], Mattson Dayal A   Wed Dec 06, 2023  3:24 PM) Patient states he is still not taking this but cardiology visit note from 12/04/23 states continue torsemide - notified office of Dr Adline Hook Badal    TRUEplus Lancets 33G MISC [id number] Yes TEST BLOOD SUGAR TWO TIMES DAILY AS NEEDED Cherre Cornish, NP Taking Active             Recommendation:   Continue with home health, wound care center, PCP, vascular as directed  Follow Up Plan:   Closing From:  Transitions of Care Program with referral complete to CCM  Tonia Frankel RN, CCM   VBCI-Population Health RN Care Manager [phone]

## 2023-12-22 DIAGNOSIS — I5033 Acute on chronic diastolic (congestive) heart failure: Secondary | ICD-10-CM | POA: Diagnosis not present

## 2023-12-22 DIAGNOSIS — E11621 Type 2 diabetes mellitus with foot ulcer: Secondary | ICD-10-CM | POA: Diagnosis not present

## 2023-12-22 DIAGNOSIS — L97412 Non-pressure chronic ulcer of right heel and midfoot with fat layer exposed: Secondary | ICD-10-CM | POA: Diagnosis not present

## 2023-12-22 DIAGNOSIS — E1151 Type 2 diabetes mellitus with diabetic peripheral angiopathy without gangrene: Secondary | ICD-10-CM | POA: Diagnosis not present

## 2023-12-22 DIAGNOSIS — L97518 Non-pressure chronic ulcer of other part of right foot with other specified severity: Secondary | ICD-10-CM | POA: Diagnosis not present

## 2023-12-22 DIAGNOSIS — L97828 Non-pressure chronic ulcer of other part of left lower leg with other specified severity: Secondary | ICD-10-CM | POA: Diagnosis not present

## 2023-12-22 DIAGNOSIS — E11622 Type 2 diabetes mellitus with other skin ulcer: Secondary | ICD-10-CM | POA: Diagnosis not present

## 2023-12-22 DIAGNOSIS — T8781 Dehiscence of amputation stump: Secondary | ICD-10-CM | POA: Diagnosis not present

## 2023-12-22 DIAGNOSIS — L03115 Cellulitis of right lower limb: Secondary | ICD-10-CM | POA: Diagnosis not present

## 2023-12-22 DIAGNOSIS — L89610 Pressure ulcer of right heel, unstageable: Secondary | ICD-10-CM | POA: Diagnosis not present

## 2023-12-22 DIAGNOSIS — Z89512 Acquired absence of left leg below knee: Secondary | ICD-10-CM | POA: Diagnosis not present

## 2023-12-22 DIAGNOSIS — I82441 Acute embolism and thrombosis of right tibial vein: Secondary | ICD-10-CM | POA: Diagnosis not present

## 2023-12-26 ENCOUNTER — Ambulatory Visit: Admitting: Medical-Surgical

## 2023-12-26 NOTE — Progress Notes (Deleted)
   Established patient visit  History, exam, impression, and plan:  No problem-specific Assessment & Plan notes found for this encounter.   ROS  Physical Exam  Procedures performed this visit: None.  No follow-ups on file.  __________________________________ Thayer Ohm, DNP, APRN, FNP-BC Primary Care and Sports Medicine [facility]

## 2023-12-27 DIAGNOSIS — T8781 Dehiscence of amputation stump: Secondary | ICD-10-CM | POA: Diagnosis not present

## 2023-12-27 DIAGNOSIS — I82441 Acute embolism and thrombosis of right tibial vein: Secondary | ICD-10-CM | POA: Diagnosis not present

## 2023-12-27 DIAGNOSIS — E11621 Type 2 diabetes mellitus with foot ulcer: Secondary | ICD-10-CM | POA: Diagnosis not present

## 2023-12-27 DIAGNOSIS — L97828 Non-pressure chronic ulcer of other part of left lower leg with other specified severity: Secondary | ICD-10-CM | POA: Diagnosis not present

## 2023-12-27 DIAGNOSIS — L89610 Pressure ulcer of right heel, unstageable: Secondary | ICD-10-CM | POA: Diagnosis not present

## 2023-12-27 DIAGNOSIS — L03115 Cellulitis of right lower limb: Secondary | ICD-10-CM | POA: Diagnosis not present

## 2023-12-27 DIAGNOSIS — L97518 Non-pressure chronic ulcer of other part of right foot with other specified severity: Secondary | ICD-10-CM | POA: Diagnosis not present

## 2023-12-27 DIAGNOSIS — E11622 Type 2 diabetes mellitus with other skin ulcer: Secondary | ICD-10-CM | POA: Diagnosis not present

## 2023-12-27 DIAGNOSIS — I5033 Acute on chronic diastolic (congestive) heart failure: Secondary | ICD-10-CM | POA: Diagnosis not present

## 2023-12-29 DIAGNOSIS — I5033 Acute on chronic diastolic (congestive) heart failure: Secondary | ICD-10-CM | POA: Diagnosis not present

## 2023-12-29 DIAGNOSIS — E11621 Type 2 diabetes mellitus with foot ulcer: Secondary | ICD-10-CM | POA: Diagnosis not present

## 2023-12-29 DIAGNOSIS — L03115 Cellulitis of right lower limb: Secondary | ICD-10-CM | POA: Diagnosis not present

## 2023-12-29 DIAGNOSIS — I82441 Acute embolism and thrombosis of right tibial vein: Secondary | ICD-10-CM | POA: Diagnosis not present

## 2023-12-29 DIAGNOSIS — L89610 Pressure ulcer of right heel, unstageable: Secondary | ICD-10-CM | POA: Diagnosis not present

## 2023-12-29 DIAGNOSIS — T8781 Dehiscence of amputation stump: Secondary | ICD-10-CM | POA: Diagnosis not present

## 2023-12-29 DIAGNOSIS — L97828 Non-pressure chronic ulcer of other part of left lower leg with other specified severity: Secondary | ICD-10-CM | POA: Diagnosis not present

## 2023-12-29 DIAGNOSIS — E11622 Type 2 diabetes mellitus with other skin ulcer: Secondary | ICD-10-CM | POA: Diagnosis not present

## 2023-12-29 DIAGNOSIS — L97518 Non-pressure chronic ulcer of other part of right foot with other specified severity: Secondary | ICD-10-CM | POA: Diagnosis not present

## 2024-01-03 DIAGNOSIS — I82441 Acute embolism and thrombosis of right tibial vein: Secondary | ICD-10-CM | POA: Diagnosis not present

## 2024-01-03 DIAGNOSIS — E11621 Type 2 diabetes mellitus with foot ulcer: Secondary | ICD-10-CM | POA: Diagnosis not present

## 2024-01-03 DIAGNOSIS — L97518 Non-pressure chronic ulcer of other part of right foot with other specified severity: Secondary | ICD-10-CM | POA: Diagnosis not present

## 2024-01-03 DIAGNOSIS — I5033 Acute on chronic diastolic (congestive) heart failure: Secondary | ICD-10-CM | POA: Diagnosis not present

## 2024-01-03 DIAGNOSIS — T8781 Dehiscence of amputation stump: Secondary | ICD-10-CM | POA: Diagnosis not present

## 2024-01-03 DIAGNOSIS — L97828 Non-pressure chronic ulcer of other part of left lower leg with other specified severity: Secondary | ICD-10-CM | POA: Diagnosis not present

## 2024-01-03 DIAGNOSIS — L03115 Cellulitis of right lower limb: Secondary | ICD-10-CM | POA: Diagnosis not present

## 2024-01-03 DIAGNOSIS — L89610 Pressure ulcer of right heel, unstageable: Secondary | ICD-10-CM | POA: Diagnosis not present

## 2024-01-03 DIAGNOSIS — E11622 Type 2 diabetes mellitus with other skin ulcer: Secondary | ICD-10-CM | POA: Diagnosis not present

## 2024-01-04 DIAGNOSIS — F17210 Nicotine dependence, cigarettes, uncomplicated: Secondary | ICD-10-CM | POA: Diagnosis not present

## 2024-01-04 DIAGNOSIS — Z794 Long term (current) use of insulin: Secondary | ICD-10-CM | POA: Diagnosis not present

## 2024-01-04 DIAGNOSIS — I739 Peripheral vascular disease, unspecified: Secondary | ICD-10-CM | POA: Diagnosis not present

## 2024-01-04 DIAGNOSIS — L97821 Non-pressure chronic ulcer of other part of left lower leg limited to breakdown of skin: Secondary | ICD-10-CM | POA: Diagnosis not present

## 2024-01-04 DIAGNOSIS — L97511 Non-pressure chronic ulcer of other part of right foot limited to breakdown of skin: Secondary | ICD-10-CM | POA: Diagnosis not present

## 2024-01-04 DIAGNOSIS — E11622 Type 2 diabetes mellitus with other skin ulcer: Secondary | ICD-10-CM | POA: Diagnosis not present

## 2024-01-04 DIAGNOSIS — E1151 Type 2 diabetes mellitus with diabetic peripheral angiopathy without gangrene: Secondary | ICD-10-CM | POA: Diagnosis not present

## 2024-01-04 DIAGNOSIS — L97319 Non-pressure chronic ulcer of right ankle with unspecified severity: Secondary | ICD-10-CM | POA: Diagnosis not present

## 2024-01-04 DIAGNOSIS — L97419 Non-pressure chronic ulcer of right heel and midfoot with unspecified severity: Secondary | ICD-10-CM | POA: Diagnosis not present

## 2024-01-04 DIAGNOSIS — L97412 Non-pressure chronic ulcer of right heel and midfoot with fat layer exposed: Secondary | ICD-10-CM | POA: Diagnosis not present

## 2024-01-04 DIAGNOSIS — E11621 Type 2 diabetes mellitus with foot ulcer: Secondary | ICD-10-CM | POA: Diagnosis not present

## 2024-01-04 DIAGNOSIS — E1152 Type 2 diabetes mellitus with diabetic peripheral angiopathy with gangrene: Secondary | ICD-10-CM | POA: Diagnosis not present

## 2024-01-05 ENCOUNTER — Telehealth: Payer: Self-pay | Admitting: Medical-Surgical

## 2024-01-05 ENCOUNTER — Encounter: Payer: Self-pay | Admitting: Medical-Surgical

## 2024-01-05 ENCOUNTER — Ambulatory Visit (INDEPENDENT_AMBULATORY_CARE_PROVIDER_SITE_OTHER): Admitting: Medical-Surgical

## 2024-01-05 VITALS — BP 129/75 | HR 79 | Resp 20 | Ht 69.0 in

## 2024-01-05 DIAGNOSIS — F1190 Opioid use, unspecified, uncomplicated: Secondary | ICD-10-CM

## 2024-01-05 DIAGNOSIS — G40909 Epilepsy, unspecified, not intractable, without status epilepticus: Secondary | ICD-10-CM

## 2024-01-05 DIAGNOSIS — E1140 Type 2 diabetes mellitus with diabetic neuropathy, unspecified: Secondary | ICD-10-CM | POA: Diagnosis not present

## 2024-01-05 DIAGNOSIS — G8929 Other chronic pain: Secondary | ICD-10-CM

## 2024-01-05 DIAGNOSIS — Z794 Long term (current) use of insulin: Secondary | ICD-10-CM

## 2024-01-05 MED ORDER — DAPAGLIFLOZIN PROPANEDIOL 10 MG PO TABS: 10.0000 mg | ORAL_TABLET | Freq: Every day | ORAL | 3 refills | Status: DC

## 2024-01-05 MED ORDER — PREGABALIN 100 MG PO CAPS: 100.0000 mg | ORAL_CAPSULE | Freq: Three times a day (TID) | ORAL | 2 refills | Status: DC

## 2024-01-05 MED ORDER — OXYCODONE HCL 10 MG PO TABS
5.0000 mg | ORAL_TABLET | Freq: Four times a day (QID) | ORAL | 0 refills | Status: DC | PRN
Start: 2024-01-05 — End: 2024-04-23

## 2024-01-05 NOTE — Telephone Encounter (Signed)
 Copied from CRM [id number]. Topic: Clinical - Prescription Issue >> Jan 05, 2024  4:00 PM Lenon Radar A wrote: Reason for CRM: Portia Brittle from [facility] called in regarding medication Oxycodone  HCl 10 MG TABS. Looking for clarification on medication. Please contact back for clarification at [phone]. Pharmacist would prefer to speak with prescribing doctor than nurse.

## 2024-01-05 NOTE — Telephone Encounter (Signed)
 Please review the message below. Thanks in advance.

## 2024-01-05 NOTE — Progress Notes (Signed)
        Established patient visit  History, exam, impression, and plan:  1. Chronic, continuous use of opioids (Primary) Pleasant 63 year old male presenting today with a history of chronic opioid use. He was previously prescribed 30 tablets for a 90 day supply. Given his recent amputation and diabetic foot ulcers, his quantity was increased to 40 tablets for 90 days. Today, the patient reports that he is already out of the medication even though it has been less than 90 days since his last refill. We have discussed his use of the medication and the recommendation for referral to pain management. He continues to decline referral. After discussion, agreement reached for 60 tablets per 90 days with absolutely NO early refills. If he runs out, he is aware that he will be without medication until his next 84 supply is available. If further adjustments are desired for more medication, he will need to see pain management. New opioid contract signed with updated information.  - Oxycodone  HCl 10 MG TABS; Take 0.5-1 tablets (5-10 mg total) by mouth every 6 (six) hours as needed. #60 tabs to last 90 days. NO early refills  Dispense: 60 tablet; Refill: 0  2. Other chronic pain Has been taking Lyrica  100mg  three times daily but does not know if it has been helping or not. Ran out of the medication a week ago but has not noted a difference in his pain level. Recommend continuing Lyrica  as prescribed as an adjunct to the Oxycodone  noted above.  - pregabalin  (LYRICA ) 100 MG capsule; Take 1 capsule (100 mg total) by mouth 3 (three) times daily.  Dispense: 90 capsule; Refill: 2  3. Seizure disorder (HCC) Has questions about Depakote  and Keppra dosing. Questions if he should be taking both of the medications together. Has not gotten in with Neurology yet. On review of his last hospital discharge, he was to continue both medications. Sending refills to the pharmacy.   4. Type 2 diabetes mellitus with diabetic  neuropathy, with long-term current use of insulin  [facility]) Reports he is out of Farxiga  for his diabetes management. Requesting refills. Refill sent but reaching out to clinical pharmacy as this was supposed to be a medication covered by patient assistance.  - dapagliflozin  propanediol (FARXIGA ) 10 MG TABS tablet; Take 1 tablet (10 mg total) by mouth daily before breakfast.  Dispense: 90 tablet; Refill: 3   Procedures performed this visit: None.  Return in about 3 months (around 04/06/2024) for chronic disease follow up.  __________________________________ Maryl Snook, DNP, APRN, FNP-BC Primary Care and Sports Medicine [facility]

## 2024-01-07 MED ORDER — LEVETIRACETAM 500 MG PO TABS: 500.0000 mg | ORAL_TABLET | Freq: Two times a day (BID) | ORAL | 1 refills | Status: DC

## 2024-01-08 ENCOUNTER — Telehealth: Payer: Self-pay

## 2024-01-08 NOTE — Telephone Encounter (Signed)
 PAP: Patient assistance application for Farxiga through [employer] ([employer]) has been mailed to pt's home address on file. Provider portion of application will be faxed to provider's office.

## 2024-01-08 NOTE — Telephone Encounter (Signed)
 Reached out to patient to make him aware of the PAP Application being mailed and to return in the provided envelope.  Left HIPAA compliant v/m with that message.

## 2024-01-10 DIAGNOSIS — E11622 Type 2 diabetes mellitus with other skin ulcer: Secondary | ICD-10-CM | POA: Diagnosis not present

## 2024-01-10 DIAGNOSIS — I6509 Occlusion and stenosis of unspecified vertebral artery: Secondary | ICD-10-CM | POA: Diagnosis not present

## 2024-01-10 DIAGNOSIS — L89610 Pressure ulcer of right heel, unstageable: Secondary | ICD-10-CM | POA: Diagnosis not present

## 2024-01-10 DIAGNOSIS — I5033 Acute on chronic diastolic (congestive) heart failure: Secondary | ICD-10-CM | POA: Diagnosis not present

## 2024-01-10 DIAGNOSIS — I5022 Chronic systolic (congestive) heart failure: Secondary | ICD-10-CM | POA: Diagnosis not present

## 2024-01-10 DIAGNOSIS — N184 Chronic kidney disease, stage 4 (severe): Secondary | ICD-10-CM | POA: Diagnosis not present

## 2024-01-10 DIAGNOSIS — Z743 Need for continuous supervision: Secondary | ICD-10-CM | POA: Diagnosis not present

## 2024-01-10 DIAGNOSIS — I491 Atrial premature depolarization: Secondary | ICD-10-CM | POA: Diagnosis not present

## 2024-01-10 DIAGNOSIS — S91301A Unspecified open wound, right foot, initial encounter: Secondary | ICD-10-CM | POA: Diagnosis not present

## 2024-01-10 DIAGNOSIS — R1310 Dysphagia, unspecified: Secondary | ICD-10-CM | POA: Diagnosis not present

## 2024-01-10 DIAGNOSIS — R809 Proteinuria, unspecified: Secondary | ICD-10-CM | POA: Diagnosis not present

## 2024-01-10 DIAGNOSIS — N183 Chronic kidney disease, stage 3 unspecified: Secondary | ICD-10-CM | POA: Diagnosis not present

## 2024-01-10 DIAGNOSIS — I10 Essential (primary) hypertension: Secondary | ICD-10-CM | POA: Diagnosis not present

## 2024-01-10 DIAGNOSIS — I70233 Atherosclerosis of native arteries of right leg with ulceration of ankle: Secondary | ICD-10-CM | POA: Diagnosis not present

## 2024-01-10 DIAGNOSIS — E1169 Type 2 diabetes mellitus with other specified complication: Secondary | ICD-10-CM | POA: Diagnosis not present

## 2024-01-10 DIAGNOSIS — E11621 Type 2 diabetes mellitus with foot ulcer: Secondary | ICD-10-CM | POA: Diagnosis not present

## 2024-01-10 DIAGNOSIS — E119 Type 2 diabetes mellitus without complications: Secondary | ICD-10-CM | POA: Diagnosis not present

## 2024-01-10 DIAGNOSIS — I808 Phlebitis and thrombophlebitis of other sites: Secondary | ICD-10-CM | POA: Diagnosis not present

## 2024-01-10 DIAGNOSIS — I6523 Occlusion and stenosis of bilateral carotid arteries: Secondary | ICD-10-CM | POA: Diagnosis not present

## 2024-01-10 DIAGNOSIS — L089 Local infection of the skin and subcutaneous tissue, unspecified: Secondary | ICD-10-CM | POA: Diagnosis not present

## 2024-01-10 DIAGNOSIS — T8781 Dehiscence of amputation stump: Secondary | ICD-10-CM | POA: Diagnosis not present

## 2024-01-10 DIAGNOSIS — R600 Localized edema: Secondary | ICD-10-CM | POA: Diagnosis not present

## 2024-01-10 DIAGNOSIS — E1152 Type 2 diabetes mellitus with diabetic peripheral angiopathy with gangrene: Secondary | ICD-10-CM | POA: Diagnosis not present

## 2024-01-10 DIAGNOSIS — M79671 Pain in right foot: Secondary | ICD-10-CM | POA: Diagnosis not present

## 2024-01-10 DIAGNOSIS — I70261 Atherosclerosis of native arteries of extremities with gangrene, right leg: Secondary | ICD-10-CM | POA: Diagnosis not present

## 2024-01-10 DIAGNOSIS — I4510 Unspecified right bundle-branch block: Secondary | ICD-10-CM | POA: Diagnosis not present

## 2024-01-10 DIAGNOSIS — I672 Cerebral atherosclerosis: Secondary | ICD-10-CM | POA: Diagnosis not present

## 2024-01-10 DIAGNOSIS — E8809 Other disorders of plasma-protein metabolism, not elsewhere classified: Secondary | ICD-10-CM | POA: Diagnosis not present

## 2024-01-10 DIAGNOSIS — I152 Hypertension secondary to endocrine disorders: Secondary | ICD-10-CM | POA: Diagnosis not present

## 2024-01-10 DIAGNOSIS — R239 Unspecified skin changes: Secondary | ICD-10-CM | POA: Diagnosis not present

## 2024-01-10 DIAGNOSIS — I739 Peripheral vascular disease, unspecified: Secondary | ICD-10-CM | POA: Diagnosis not present

## 2024-01-10 DIAGNOSIS — M86171 Other acute osteomyelitis, right ankle and foot: Secondary | ICD-10-CM | POA: Diagnosis not present

## 2024-01-10 DIAGNOSIS — L97519 Non-pressure chronic ulcer of other part of right foot with unspecified severity: Secondary | ICD-10-CM | POA: Diagnosis not present

## 2024-01-10 DIAGNOSIS — I6381 Other cerebral infarction due to occlusion or stenosis of small artery: Secondary | ICD-10-CM | POA: Diagnosis not present

## 2024-01-10 DIAGNOSIS — I70234 Atherosclerosis of native arteries of right leg with ulceration of heel and midfoot: Secondary | ICD-10-CM | POA: Diagnosis not present

## 2024-01-10 DIAGNOSIS — M869 Osteomyelitis, unspecified: Secondary | ICD-10-CM | POA: Diagnosis not present

## 2024-01-10 DIAGNOSIS — N1832 Chronic kidney disease, stage 3b: Secondary | ICD-10-CM | POA: Diagnosis not present

## 2024-01-10 DIAGNOSIS — I82441 Acute embolism and thrombosis of right tibial vein: Secondary | ICD-10-CM | POA: Diagnosis not present

## 2024-01-10 DIAGNOSIS — M868X7 Other osteomyelitis, ankle and foot: Secondary | ICD-10-CM | POA: Diagnosis not present

## 2024-01-10 DIAGNOSIS — D631 Anemia in chronic kidney disease: Secondary | ICD-10-CM | POA: Diagnosis not present

## 2024-01-10 DIAGNOSIS — I70201 Unspecified atherosclerosis of native arteries of extremities, right leg: Secondary | ICD-10-CM | POA: Diagnosis not present

## 2024-01-10 DIAGNOSIS — L03115 Cellulitis of right lower limb: Secondary | ICD-10-CM | POA: Diagnosis not present

## 2024-01-10 DIAGNOSIS — I2510 Atherosclerotic heart disease of native coronary artery without angina pectoris: Secondary | ICD-10-CM | POA: Diagnosis not present

## 2024-01-10 DIAGNOSIS — I70221 Atherosclerosis of native arteries of extremities with rest pain, right leg: Secondary | ICD-10-CM | POA: Diagnosis not present

## 2024-01-10 DIAGNOSIS — I499 Cardiac arrhythmia, unspecified: Secondary | ICD-10-CM | POA: Diagnosis not present

## 2024-01-10 DIAGNOSIS — L97518 Non-pressure chronic ulcer of other part of right foot with other specified severity: Secondary | ICD-10-CM | POA: Diagnosis not present

## 2024-01-10 DIAGNOSIS — Z95828 Presence of other vascular implants and grafts: Secondary | ICD-10-CM | POA: Diagnosis not present

## 2024-01-10 DIAGNOSIS — T82858A Stenosis of vascular prosthetic devices, implants and grafts, initial encounter: Secondary | ICD-10-CM | POA: Diagnosis not present

## 2024-01-10 DIAGNOSIS — E11628 Type 2 diabetes mellitus with other skin complications: Secondary | ICD-10-CM | POA: Diagnosis not present

## 2024-01-10 DIAGNOSIS — L97828 Non-pressure chronic ulcer of other part of left lower leg with other specified severity: Secondary | ICD-10-CM | POA: Diagnosis not present

## 2024-01-11 DIAGNOSIS — M86171 Other acute osteomyelitis, right ankle and foot: Secondary | ICD-10-CM | POA: Diagnosis not present

## 2024-01-11 DIAGNOSIS — R600 Localized edema: Secondary | ICD-10-CM | POA: Diagnosis not present

## 2024-01-11 DIAGNOSIS — I739 Peripheral vascular disease, unspecified: Secondary | ICD-10-CM | POA: Diagnosis not present

## 2024-01-11 DIAGNOSIS — M868X7 Other osteomyelitis, ankle and foot: Secondary | ICD-10-CM | POA: Diagnosis not present

## 2024-01-11 DIAGNOSIS — L089 Local infection of the skin and subcutaneous tissue, unspecified: Secondary | ICD-10-CM | POA: Diagnosis not present

## 2024-01-11 DIAGNOSIS — E11628 Type 2 diabetes mellitus with other skin complications: Secondary | ICD-10-CM | POA: Diagnosis not present

## 2024-01-12 DIAGNOSIS — E11628 Type 2 diabetes mellitus with other skin complications: Secondary | ICD-10-CM | POA: Diagnosis not present

## 2024-01-12 DIAGNOSIS — L089 Local infection of the skin and subcutaneous tissue, unspecified: Secondary | ICD-10-CM | POA: Diagnosis not present

## 2024-01-13 DIAGNOSIS — L089 Local infection of the skin and subcutaneous tissue, unspecified: Secondary | ICD-10-CM | POA: Diagnosis not present

## 2024-01-13 DIAGNOSIS — E11628 Type 2 diabetes mellitus with other skin complications: Secondary | ICD-10-CM | POA: Diagnosis not present

## 2024-01-14 DIAGNOSIS — L089 Local infection of the skin and subcutaneous tissue, unspecified: Secondary | ICD-10-CM | POA: Diagnosis not present

## 2024-01-14 DIAGNOSIS — E11628 Type 2 diabetes mellitus with other skin complications: Secondary | ICD-10-CM | POA: Diagnosis not present

## 2024-01-15 DIAGNOSIS — L089 Local infection of the skin and subcutaneous tissue, unspecified: Secondary | ICD-10-CM | POA: Diagnosis not present

## 2024-01-15 DIAGNOSIS — I4510 Unspecified right bundle-branch block: Secondary | ICD-10-CM | POA: Diagnosis not present

## 2024-01-15 DIAGNOSIS — E11628 Type 2 diabetes mellitus with other skin complications: Secondary | ICD-10-CM | POA: Diagnosis not present

## 2024-01-15 DIAGNOSIS — I491 Atrial premature depolarization: Secondary | ICD-10-CM | POA: Diagnosis not present

## 2024-01-16 DIAGNOSIS — E11628 Type 2 diabetes mellitus with other skin complications: Secondary | ICD-10-CM | POA: Diagnosis not present

## 2024-01-16 DIAGNOSIS — L089 Local infection of the skin and subcutaneous tissue, unspecified: Secondary | ICD-10-CM | POA: Diagnosis not present

## 2024-01-18 DIAGNOSIS — E11628 Type 2 diabetes mellitus with other skin complications: Secondary | ICD-10-CM | POA: Diagnosis not present

## 2024-01-18 DIAGNOSIS — L089 Local infection of the skin and subcutaneous tissue, unspecified: Secondary | ICD-10-CM | POA: Diagnosis not present

## 2024-01-19 ENCOUNTER — Telehealth: Payer: Self-pay

## 2024-01-19 ENCOUNTER — Telehealth: Payer: Self-pay | Admitting: *Deleted

## 2024-01-19 DIAGNOSIS — I4510 Unspecified right bundle-branch block: Secondary | ICD-10-CM | POA: Diagnosis not present

## 2024-01-19 DIAGNOSIS — I499 Cardiac arrhythmia, unspecified: Secondary | ICD-10-CM | POA: Diagnosis not present

## 2024-01-19 DIAGNOSIS — L089 Local infection of the skin and subcutaneous tissue, unspecified: Secondary | ICD-10-CM | POA: Diagnosis not present

## 2024-01-19 DIAGNOSIS — E11628 Type 2 diabetes mellitus with other skin complications: Secondary | ICD-10-CM | POA: Diagnosis not present

## 2024-01-19 DIAGNOSIS — I491 Atrial premature depolarization: Secondary | ICD-10-CM | POA: Diagnosis not present

## 2024-01-19 NOTE — Progress Notes (Signed)
   01/19/2024  Patient ID: Joseph Grimes, male   DOB: 1961-01-22, 63 y.o.   MRN: [id number]  Outreach attempt to follow-up with patient regarding Farxiga  10mg  PAP paperwork mailed to him by the medication assistance team 6/2, because paperwork has not yet been returned.  I was unable to reach the patient or emergency contact; both numbers state call cannot be completed at this time.    It does appear patient currently admitted at [facility] for diabetic foot infection.  Linn Rich, PharmD, DPLA

## 2024-01-19 NOTE — Progress Notes (Unsigned)
 Complex Care Management Care Guide Note  01/19/2024 Name: Joseph Grimes MRN: [id number] DOB: 08/18/1960  Joseph Grimes is a 63 y.o. year old male who is a primary care patient of Cherre Cornish, NP and is actively engaged with the care management team. I reached out to Stefani Edin by phone today to assist with re-scheduling  with the RN Case Manager.  Follow up plan: Unsuccessful telephone outreach attempt made. A HIPAA compliant phone message was left for the patient providing contact information and requesting a return call.  Kandis Ormond, CMA [place]  [facility], [facility] Guide Direct Dial: [phone]  Fax: [phone] Website: [email or web address]

## 2024-01-20 DIAGNOSIS — L089 Local infection of the skin and subcutaneous tissue, unspecified: Secondary | ICD-10-CM | POA: Diagnosis not present

## 2024-01-20 DIAGNOSIS — E11628 Type 2 diabetes mellitus with other skin complications: Secondary | ICD-10-CM | POA: Diagnosis not present

## 2024-01-21 DIAGNOSIS — L089 Local infection of the skin and subcutaneous tissue, unspecified: Secondary | ICD-10-CM | POA: Diagnosis not present

## 2024-01-21 DIAGNOSIS — E11628 Type 2 diabetes mellitus with other skin complications: Secondary | ICD-10-CM | POA: Diagnosis not present

## 2024-01-22 DIAGNOSIS — I739 Peripheral vascular disease, unspecified: Secondary | ICD-10-CM | POA: Diagnosis not present

## 2024-01-22 DIAGNOSIS — L97512 Non-pressure chronic ulcer of other part of right foot with fat layer exposed: Secondary | ICD-10-CM | POA: Diagnosis not present

## 2024-01-22 DIAGNOSIS — Z743 Need for continuous supervision: Secondary | ICD-10-CM | POA: Diagnosis not present

## 2024-01-22 DIAGNOSIS — E11628 Type 2 diabetes mellitus with other skin complications: Secondary | ICD-10-CM | POA: Diagnosis not present

## 2024-01-22 DIAGNOSIS — N1832 Chronic kidney disease, stage 3b: Secondary | ICD-10-CM | POA: Diagnosis not present

## 2024-01-22 DIAGNOSIS — L97412 Non-pressure chronic ulcer of right heel and midfoot with fat layer exposed: Secondary | ICD-10-CM | POA: Diagnosis not present

## 2024-01-22 DIAGNOSIS — E11621 Type 2 diabetes mellitus with foot ulcer: Secondary | ICD-10-CM | POA: Diagnosis not present

## 2024-01-22 DIAGNOSIS — L989 Disorder of the skin and subcutaneous tissue, unspecified: Secondary | ICD-10-CM | POA: Diagnosis not present

## 2024-01-22 DIAGNOSIS — L089 Local infection of the skin and subcutaneous tissue, unspecified: Secondary | ICD-10-CM | POA: Diagnosis not present

## 2024-01-22 DIAGNOSIS — M869 Osteomyelitis, unspecified: Secondary | ICD-10-CM | POA: Diagnosis not present

## 2024-01-22 DIAGNOSIS — I70221 Atherosclerosis of native arteries of extremities with rest pain, right leg: Secondary | ICD-10-CM | POA: Diagnosis not present

## 2024-01-22 DIAGNOSIS — I70234 Atherosclerosis of native arteries of right leg with ulceration of heel and midfoot: Secondary | ICD-10-CM | POA: Diagnosis not present

## 2024-01-22 DIAGNOSIS — I70235 Atherosclerosis of native arteries of right leg with ulceration of other part of foot: Secondary | ICD-10-CM | POA: Diagnosis not present

## 2024-01-22 DIAGNOSIS — R278 Other lack of coordination: Secondary | ICD-10-CM | POA: Diagnosis not present

## 2024-01-22 DIAGNOSIS — E119 Type 2 diabetes mellitus without complications: Secondary | ICD-10-CM | POA: Diagnosis not present

## 2024-01-22 DIAGNOSIS — M6281 Muscle weakness (generalized): Secondary | ICD-10-CM | POA: Diagnosis not present

## 2024-01-22 DIAGNOSIS — I10 Essential (primary) hypertension: Secondary | ICD-10-CM | POA: Diagnosis not present

## 2024-01-22 DIAGNOSIS — I70233 Atherosclerosis of native arteries of right leg with ulceration of ankle: Secondary | ICD-10-CM | POA: Diagnosis not present

## 2024-01-22 DIAGNOSIS — Z89512 Acquired absence of left leg below knee: Secondary | ICD-10-CM | POA: Diagnosis not present

## 2024-01-22 NOTE — Progress Notes (Signed)
 Complex Care Management Care Guide Note  01/22/2024 Name: [name] MRN: [id number] DOB: 1960/09/11  Joseph Grimes is a 63 y.o. year old male who is a primary care patient of Cherre Cornish, NP and is actively engaged with the care management team. I reached out to Stefani Edin by phone today to assist with re-scheduling  with the RN Case Manager.  Follow up plan: Unsuccessful telephone outreach attempt made. A HIPAA compliant phone message was left for the patient providing contact information and requesting a return call. No further outreach attempt will be made due to inability to maintain patient contact.   Kandis Ormond, CMA, [place]  [facility], [facility] Guide Direct Dial: [phone]  Fax: [phone] Website: [email or web address]

## 2024-01-23 DIAGNOSIS — E11621 Type 2 diabetes mellitus with foot ulcer: Secondary | ICD-10-CM | POA: Diagnosis not present

## 2024-01-23 DIAGNOSIS — I739 Peripheral vascular disease, unspecified: Secondary | ICD-10-CM | POA: Diagnosis not present

## 2024-01-23 DIAGNOSIS — I70235 Atherosclerosis of native arteries of right leg with ulceration of other part of foot: Secondary | ICD-10-CM | POA: Diagnosis not present

## 2024-01-23 DIAGNOSIS — N1832 Chronic kidney disease, stage 3b: Secondary | ICD-10-CM | POA: Diagnosis not present

## 2024-01-23 DIAGNOSIS — L97512 Non-pressure chronic ulcer of other part of right foot with fat layer exposed: Secondary | ICD-10-CM | POA: Diagnosis not present

## 2024-01-23 DIAGNOSIS — E119 Type 2 diabetes mellitus without complications: Secondary | ICD-10-CM | POA: Diagnosis not present

## 2024-01-23 DIAGNOSIS — M869 Osteomyelitis, unspecified: Secondary | ICD-10-CM | POA: Diagnosis not present

## 2024-01-25 DIAGNOSIS — M6281 Muscle weakness (generalized): Secondary | ICD-10-CM | POA: Diagnosis not present

## 2024-01-25 DIAGNOSIS — R278 Other lack of coordination: Secondary | ICD-10-CM | POA: Diagnosis not present

## 2024-01-25 DIAGNOSIS — I739 Peripheral vascular disease, unspecified: Secondary | ICD-10-CM | POA: Diagnosis not present

## 2024-01-25 DIAGNOSIS — E119 Type 2 diabetes mellitus without complications: Secondary | ICD-10-CM | POA: Diagnosis not present

## 2024-01-25 DIAGNOSIS — M869 Osteomyelitis, unspecified: Secondary | ICD-10-CM | POA: Diagnosis not present

## 2024-01-25 DIAGNOSIS — Z89512 Acquired absence of left leg below knee: Secondary | ICD-10-CM | POA: Diagnosis not present

## 2024-01-25 DIAGNOSIS — E11628 Type 2 diabetes mellitus with other skin complications: Secondary | ICD-10-CM | POA: Diagnosis not present

## 2024-01-26 NOTE — Telephone Encounter (Signed)
 Reached out again regarding Pap application for Farxiga  and phone number says  cannot complete call at this time.

## 2024-01-29 DIAGNOSIS — R278 Other lack of coordination: Secondary | ICD-10-CM | POA: Diagnosis not present

## 2024-01-29 DIAGNOSIS — M869 Osteomyelitis, unspecified: Secondary | ICD-10-CM | POA: Diagnosis not present

## 2024-01-29 DIAGNOSIS — Z89512 Acquired absence of left leg below knee: Secondary | ICD-10-CM | POA: Diagnosis not present

## 2024-01-29 DIAGNOSIS — E11628 Type 2 diabetes mellitus with other skin complications: Secondary | ICD-10-CM | POA: Diagnosis not present

## 2024-01-29 DIAGNOSIS — M6281 Muscle weakness (generalized): Secondary | ICD-10-CM | POA: Diagnosis not present

## 2024-01-29 DIAGNOSIS — E119 Type 2 diabetes mellitus without complications: Secondary | ICD-10-CM | POA: Diagnosis not present

## 2024-01-29 DIAGNOSIS — I739 Peripheral vascular disease, unspecified: Secondary | ICD-10-CM | POA: Diagnosis not present

## 2024-01-29 DIAGNOSIS — L989 Disorder of the skin and subcutaneous tissue, unspecified: Secondary | ICD-10-CM | POA: Diagnosis not present

## 2024-01-30 DIAGNOSIS — L97412 Non-pressure chronic ulcer of right heel and midfoot with fat layer exposed: Secondary | ICD-10-CM | POA: Diagnosis not present

## 2024-01-30 DIAGNOSIS — I70234 Atherosclerosis of native arteries of right leg with ulceration of heel and midfoot: Secondary | ICD-10-CM | POA: Diagnosis not present

## 2024-01-30 DIAGNOSIS — I739 Peripheral vascular disease, unspecified: Secondary | ICD-10-CM | POA: Diagnosis not present

## 2024-01-30 DIAGNOSIS — E11621 Type 2 diabetes mellitus with foot ulcer: Secondary | ICD-10-CM | POA: Diagnosis not present

## 2024-01-31 DIAGNOSIS — E119 Type 2 diabetes mellitus without complications: Secondary | ICD-10-CM | POA: Diagnosis not present

## 2024-01-31 DIAGNOSIS — M6281 Muscle weakness (generalized): Secondary | ICD-10-CM | POA: Diagnosis not present

## 2024-01-31 DIAGNOSIS — I739 Peripheral vascular disease, unspecified: Secondary | ICD-10-CM | POA: Diagnosis not present

## 2024-01-31 DIAGNOSIS — Z89512 Acquired absence of left leg below knee: Secondary | ICD-10-CM | POA: Diagnosis not present

## 2024-01-31 DIAGNOSIS — R278 Other lack of coordination: Secondary | ICD-10-CM | POA: Diagnosis not present

## 2024-01-31 DIAGNOSIS — M869 Osteomyelitis, unspecified: Secondary | ICD-10-CM | POA: Diagnosis not present

## 2024-01-31 DIAGNOSIS — E11628 Type 2 diabetes mellitus with other skin complications: Secondary | ICD-10-CM | POA: Diagnosis not present

## 2024-02-05 DIAGNOSIS — Z89512 Acquired absence of left leg below knee: Secondary | ICD-10-CM | POA: Diagnosis not present

## 2024-02-05 DIAGNOSIS — E11628 Type 2 diabetes mellitus with other skin complications: Secondary | ICD-10-CM | POA: Diagnosis not present

## 2024-02-05 DIAGNOSIS — I739 Peripheral vascular disease, unspecified: Secondary | ICD-10-CM | POA: Diagnosis not present

## 2024-02-05 DIAGNOSIS — M869 Osteomyelitis, unspecified: Secondary | ICD-10-CM | POA: Diagnosis not present

## 2024-02-05 DIAGNOSIS — E119 Type 2 diabetes mellitus without complications: Secondary | ICD-10-CM | POA: Diagnosis not present

## 2024-02-05 DIAGNOSIS — M6281 Muscle weakness (generalized): Secondary | ICD-10-CM | POA: Diagnosis not present

## 2024-02-05 DIAGNOSIS — R278 Other lack of coordination: Secondary | ICD-10-CM | POA: Diagnosis not present

## 2024-02-06 DIAGNOSIS — I70239 Atherosclerosis of native arteries of right leg with ulceration of unspecified site: Secondary | ICD-10-CM | POA: Diagnosis not present

## 2024-02-06 DIAGNOSIS — I70221 Atherosclerosis of native arteries of extremities with rest pain, right leg: Secondary | ICD-10-CM | POA: Diagnosis not present

## 2024-02-06 DIAGNOSIS — Z89511 Acquired absence of right leg below knee: Secondary | ICD-10-CM | POA: Diagnosis not present

## 2024-02-06 DIAGNOSIS — D631 Anemia in chronic kidney disease: Secondary | ICD-10-CM | POA: Diagnosis not present

## 2024-02-06 DIAGNOSIS — I152 Hypertension secondary to endocrine disorders: Secondary | ICD-10-CM | POA: Diagnosis not present

## 2024-02-06 DIAGNOSIS — E875 Hyperkalemia: Secondary | ICD-10-CM | POA: Diagnosis not present

## 2024-02-06 DIAGNOSIS — I96 Gangrene, not elsewhere classified: Secondary | ICD-10-CM | POA: Diagnosis not present

## 2024-02-06 DIAGNOSIS — I5020 Unspecified systolic (congestive) heart failure: Secondary | ICD-10-CM | POA: Diagnosis not present

## 2024-02-06 DIAGNOSIS — I70261 Atherosclerosis of native arteries of extremities with gangrene, right leg: Secondary | ICD-10-CM | POA: Diagnosis not present

## 2024-02-06 DIAGNOSIS — G8918 Other acute postprocedural pain: Secondary | ICD-10-CM | POA: Diagnosis not present

## 2024-02-06 DIAGNOSIS — L97211 Non-pressure chronic ulcer of right calf limited to breakdown of skin: Secondary | ICD-10-CM | POA: Diagnosis not present

## 2024-02-06 DIAGNOSIS — E1152 Type 2 diabetes mellitus with diabetic peripheral angiopathy with gangrene: Secondary | ICD-10-CM | POA: Diagnosis not present

## 2024-02-06 DIAGNOSIS — N184 Chronic kidney disease, stage 4 (severe): Secondary | ICD-10-CM | POA: Diagnosis not present

## 2024-02-06 DIAGNOSIS — Z89512 Acquired absence of left leg below knee: Secondary | ICD-10-CM | POA: Diagnosis not present

## 2024-02-06 DIAGNOSIS — L97919 Non-pressure chronic ulcer of unspecified part of right lower leg with unspecified severity: Secondary | ICD-10-CM | POA: Diagnosis not present

## 2024-02-06 DIAGNOSIS — I4510 Unspecified right bundle-branch block: Secondary | ICD-10-CM | POA: Diagnosis not present

## 2024-02-06 DIAGNOSIS — J432 Centrilobular emphysema: Secondary | ICD-10-CM | POA: Diagnosis not present

## 2024-02-06 DIAGNOSIS — I70291 Other atherosclerosis of native arteries of extremities, right leg: Secondary | ICD-10-CM | POA: Diagnosis not present

## 2024-02-06 DIAGNOSIS — E1122 Type 2 diabetes mellitus with diabetic chronic kidney disease: Secondary | ICD-10-CM | POA: Diagnosis not present

## 2024-02-06 DIAGNOSIS — M86171 Other acute osteomyelitis, right ankle and foot: Secondary | ICD-10-CM | POA: Diagnosis not present

## 2024-02-06 LAB — HEMOGLOBIN A1C: Hemoglobin A1C: 7.9

## 2024-02-07 DIAGNOSIS — I70221 Atherosclerosis of native arteries of extremities with rest pain, right leg: Secondary | ICD-10-CM | POA: Diagnosis not present

## 2024-02-07 DIAGNOSIS — E875 Hyperkalemia: Secondary | ICD-10-CM | POA: Diagnosis not present

## 2024-02-08 DIAGNOSIS — E875 Hyperkalemia: Secondary | ICD-10-CM | POA: Diagnosis not present

## 2024-02-09 DIAGNOSIS — E875 Hyperkalemia: Secondary | ICD-10-CM | POA: Diagnosis not present

## 2024-02-10 DIAGNOSIS — E875 Hyperkalemia: Secondary | ICD-10-CM | POA: Diagnosis not present

## 2024-02-11 DIAGNOSIS — E875 Hyperkalemia: Secondary | ICD-10-CM | POA: Diagnosis not present

## 2024-02-12 DIAGNOSIS — E875 Hyperkalemia: Secondary | ICD-10-CM | POA: Diagnosis not present

## 2024-02-13 ENCOUNTER — Telehealth: Payer: Self-pay

## 2024-02-13 NOTE — Transitions of Care (Post Inpatient/ED Visit) (Signed)
   02/13/2024  Name: JEREMEY BASCOM MRN: [id number] DOB: [birth year]  Today's TOC FU Call Status: Today's TOC FU Call Status:: Unsuccessful Call (1st Attempt) Unsuccessful Call (1st Attempt) Date: 02/13/24  Attempted to reach the patient regarding the most recent Inpatient/ED visit.  Follow Up Plan: Additional outreach attempts will be made to reach the patient to complete the Transitions of Care (Post Inpatient/ED visit) call.   Shona Prow RN, CCM [place]  VBCI-Population Health RN Care Manager [phone]

## 2024-02-14 ENCOUNTER — Telehealth: Payer: Self-pay

## 2024-02-14 ENCOUNTER — Telehealth: Payer: Self-pay | Admitting: Medical-Surgical

## 2024-02-14 DIAGNOSIS — N1832 Chronic kidney disease, stage 3b: Secondary | ICD-10-CM | POA: Diagnosis not present

## 2024-02-14 DIAGNOSIS — I5022 Chronic systolic (congestive) heart failure: Secondary | ICD-10-CM | POA: Diagnosis not present

## 2024-02-14 DIAGNOSIS — E11649 Type 2 diabetes mellitus with hypoglycemia without coma: Secondary | ICD-10-CM | POA: Diagnosis not present

## 2024-02-14 DIAGNOSIS — S51812D Laceration without foreign body of left forearm, subsequent encounter: Secondary | ICD-10-CM | POA: Diagnosis not present

## 2024-02-14 DIAGNOSIS — S51811D Laceration without foreign body of right forearm, subsequent encounter: Secondary | ICD-10-CM | POA: Diagnosis not present

## 2024-02-14 DIAGNOSIS — Z4781 Encounter for orthopedic aftercare following surgical amputation: Secondary | ICD-10-CM | POA: Diagnosis not present

## 2024-02-14 DIAGNOSIS — I130 Hypertensive heart and chronic kidney disease with heart failure and stage 1 through stage 4 chronic kidney disease, or unspecified chronic kidney disease: Secondary | ICD-10-CM | POA: Diagnosis not present

## 2024-02-14 DIAGNOSIS — D631 Anemia in chronic kidney disease: Secondary | ICD-10-CM | POA: Diagnosis not present

## 2024-02-14 DIAGNOSIS — E1122 Type 2 diabetes mellitus with diabetic chronic kidney disease: Secondary | ICD-10-CM | POA: Diagnosis not present

## 2024-02-14 NOTE — Transitions of Care (Post Inpatient/ED Visit) (Signed)
   02/14/2024  Name: BENZION MESTA MRN: [id number] DOB: [birth year]  Today's TOC FU Call Status:    Attempted to reach the patient regarding the most recent Inpatient/ED visit.  Follow Up Plan: Additional outreach attempts will be made to reach the patient to complete the Transitions of Care (Post Inpatient/ED visit) call.   Shona Prow RN, CCM [place]  VBCI-Population Health RN Care Manager [phone]

## 2024-02-14 NOTE — Telephone Encounter (Signed)
 Copied from CRM [id number]. Topic: Medical Record Request - Provider/Facility Request >> Feb 14, 2024  8:51 AM Graeme ORN wrote: Reason for CRM: Marval Regal with [facility] to see if patient sees Zada and is she will sign home health orders for wound care. Also requesting copy of last office visit note.  Fax: [phone]

## 2024-02-15 ENCOUNTER — Telehealth: Payer: Self-pay | Admitting: *Deleted

## 2024-02-15 ENCOUNTER — Telehealth: Payer: Self-pay

## 2024-02-15 ENCOUNTER — Encounter: Payer: Self-pay | Admitting: Vascular Surgery

## 2024-02-15 NOTE — Transitions of Care (Post Inpatient/ED Visit) (Signed)
   02/15/2024  Name: Joseph Grimes MRN: [id number] DOB: [birth year]  Today's TOC FU Call Status: Today's TOC FU Call Status:: Unsuccessful Call (3rd Attempt) Unsuccessful Call (3rd Attempt) Date: 02/15/24  Attempted to reach the patient regarding the most recent Inpatient/ED visit.  Follow Up Plan: No further outreach attempts will be made at this time. We have been unable to contact the patient.  Andrea Dimes RN, BSN [place]  [facility] RN Care Manager [phone]

## 2024-02-15 NOTE — Transitions of Care (Post Inpatient/ED Visit) (Signed)
   02/15/2024  Name: Joseph Grimes MRN: [id number] DOB: [birth year]  Today's TOC FU Call Status: Today's TOC FU Call Status:: Unsuccessful Call (2nd Attempt) Unsuccessful Call (2nd Attempt) Date: 02/14/24  Attempted to reach the patient regarding the most recent Inpatient/ED visit.  Follow Up Plan: No further outreach attempts will be made at this time. We have been unable to contact the patient.  Shona Prow RN, CCM [place]  VBCI-Population Health RN Care Manager [phone]

## 2024-02-21 NOTE — Telephone Encounter (Signed)
 Reached out again regarding PAP application for Farxiga  (AZ&Me) and got the same phone service message- no v/m set up.

## 2024-02-22 ENCOUNTER — Encounter: Payer: Self-pay | Admitting: Neurology

## 2024-02-22 ENCOUNTER — Ambulatory Visit: Admitting: Neurology

## 2024-02-26 ENCOUNTER — Ambulatory Visit: Payer: Self-pay

## 2024-02-26 DIAGNOSIS — R0689 Other abnormalities of breathing: Secondary | ICD-10-CM | POA: Diagnosis not present

## 2024-02-26 DIAGNOSIS — G928 Other toxic encephalopathy: Secondary | ICD-10-CM | POA: Diagnosis not present

## 2024-02-26 DIAGNOSIS — Z89512 Acquired absence of left leg below knee: Secondary | ICD-10-CM | POA: Diagnosis not present

## 2024-02-26 DIAGNOSIS — D631 Anemia in chronic kidney disease: Secondary | ICD-10-CM | POA: Diagnosis not present

## 2024-02-26 DIAGNOSIS — I6782 Cerebral ischemia: Secondary | ICD-10-CM | POA: Diagnosis not present

## 2024-02-26 DIAGNOSIS — R0602 Shortness of breath: Secondary | ICD-10-CM | POA: Diagnosis not present

## 2024-02-26 DIAGNOSIS — R918 Other nonspecific abnormal finding of lung field: Secondary | ICD-10-CM | POA: Diagnosis not present

## 2024-02-26 DIAGNOSIS — R410 Disorientation, unspecified: Secondary | ICD-10-CM | POA: Diagnosis not present

## 2024-02-26 DIAGNOSIS — M25561 Pain in right knee: Secondary | ICD-10-CM | POA: Diagnosis not present

## 2024-02-26 DIAGNOSIS — E7800 Pure hypercholesterolemia, unspecified: Secondary | ICD-10-CM | POA: Diagnosis not present

## 2024-02-26 DIAGNOSIS — I491 Atrial premature depolarization: Secondary | ICD-10-CM | POA: Diagnosis not present

## 2024-02-26 DIAGNOSIS — F4489 Other dissociative and conversion disorders: Secondary | ICD-10-CM | POA: Diagnosis not present

## 2024-02-26 DIAGNOSIS — R000 Tachycardia, unspecified: Secondary | ICD-10-CM | POA: Diagnosis not present

## 2024-02-26 DIAGNOSIS — N179 Acute kidney failure, unspecified: Secondary | ICD-10-CM | POA: Diagnosis not present

## 2024-02-26 DIAGNOSIS — N184 Chronic kidney disease, stage 4 (severe): Secondary | ICD-10-CM | POA: Diagnosis not present

## 2024-02-26 DIAGNOSIS — I959 Hypotension, unspecified: Secondary | ICD-10-CM | POA: Diagnosis not present

## 2024-02-26 DIAGNOSIS — I4510 Unspecified right bundle-branch block: Secondary | ICD-10-CM | POA: Diagnosis not present

## 2024-02-26 DIAGNOSIS — E876 Hypokalemia: Secondary | ICD-10-CM | POA: Diagnosis not present

## 2024-02-26 DIAGNOSIS — E1151 Type 2 diabetes mellitus with diabetic peripheral angiopathy without gangrene: Secondary | ICD-10-CM | POA: Diagnosis not present

## 2024-02-26 DIAGNOSIS — N1832 Chronic kidney disease, stage 3b: Secondary | ICD-10-CM | POA: Diagnosis not present

## 2024-02-26 DIAGNOSIS — E1122 Type 2 diabetes mellitus with diabetic chronic kidney disease: Secondary | ICD-10-CM | POA: Diagnosis not present

## 2024-02-26 DIAGNOSIS — Z89511 Acquired absence of right leg below knee: Secondary | ICD-10-CM | POA: Diagnosis not present

## 2024-02-26 DIAGNOSIS — I672 Cerebral atherosclerosis: Secondary | ICD-10-CM | POA: Diagnosis not present

## 2024-02-26 DIAGNOSIS — E8729 Other acidosis: Secondary | ICD-10-CM | POA: Diagnosis not present

## 2024-02-26 DIAGNOSIS — K529 Noninfective gastroenteritis and colitis, unspecified: Secondary | ICD-10-CM | POA: Diagnosis not present

## 2024-02-26 DIAGNOSIS — I10 Essential (primary) hypertension: Secondary | ICD-10-CM | POA: Diagnosis not present

## 2024-02-26 DIAGNOSIS — G9340 Encephalopathy, unspecified: Secondary | ICD-10-CM | POA: Diagnosis not present

## 2024-02-26 DIAGNOSIS — R809 Proteinuria, unspecified: Secondary | ICD-10-CM | POA: Diagnosis not present

## 2024-02-26 DIAGNOSIS — J432 Centrilobular emphysema: Secondary | ICD-10-CM | POA: Diagnosis not present

## 2024-02-26 DIAGNOSIS — D72829 Elevated white blood cell count, unspecified: Secondary | ICD-10-CM | POA: Diagnosis not present

## 2024-02-26 DIAGNOSIS — I5022 Chronic systolic (congestive) heart failure: Secondary | ICD-10-CM | POA: Diagnosis not present

## 2024-02-26 DIAGNOSIS — R4182 Altered mental status, unspecified: Secondary | ICD-10-CM | POA: Diagnosis not present

## 2024-02-26 DIAGNOSIS — M1711 Unilateral primary osteoarthritis, right knee: Secondary | ICD-10-CM | POA: Diagnosis not present

## 2024-02-26 DIAGNOSIS — I739 Peripheral vascular disease, unspecified: Secondary | ICD-10-CM | POA: Diagnosis not present

## 2024-02-26 DIAGNOSIS — Z743 Need for continuous supervision: Secondary | ICD-10-CM | POA: Diagnosis not present

## 2024-02-26 DIAGNOSIS — I2510 Atherosclerotic heart disease of native coronary artery without angina pectoris: Secondary | ICD-10-CM | POA: Diagnosis not present

## 2024-02-26 DIAGNOSIS — N183 Chronic kidney disease, stage 3 unspecified: Secondary | ICD-10-CM | POA: Diagnosis not present

## 2024-02-26 DIAGNOSIS — I4719 Other supraventricular tachycardia: Secondary | ICD-10-CM | POA: Diagnosis not present

## 2024-02-26 DIAGNOSIS — E1159 Type 2 diabetes mellitus with other circulatory complications: Secondary | ICD-10-CM | POA: Diagnosis not present

## 2024-02-26 DIAGNOSIS — I63412 Cerebral infarction due to embolism of left middle cerebral artery: Secondary | ICD-10-CM | POA: Diagnosis not present

## 2024-02-26 DIAGNOSIS — J449 Chronic obstructive pulmonary disease, unspecified: Secondary | ICD-10-CM | POA: Diagnosis not present

## 2024-02-26 DIAGNOSIS — A419 Sepsis, unspecified organism: Secondary | ICD-10-CM | POA: Diagnosis not present

## 2024-02-26 DIAGNOSIS — I152 Hypertension secondary to endocrine disorders: Secondary | ICD-10-CM | POA: Diagnosis not present

## 2024-02-26 NOTE — Telephone Encounter (Signed)
 Per provider's request - a wellness check has been issue with [facility] department. A unit will be sent out to check on the patient. Any updates will be provided to the provider directly via cell phone.

## 2024-02-26 NOTE — Telephone Encounter (Signed)
 Patient's daughter called back, stating that she does not believe the patient needs to go back to the ED, but believes he needs physical therapy and would like him to be referred to a rehab center. Please advise. Her number is [phone].

## 2024-02-26 NOTE — Telephone Encounter (Signed)
 Attempted to contact both the patient and daughter regarding the patient's symptoms. No answer. Phone keeps ringing. Unable to leave a vm msg.

## 2024-02-26 NOTE — Telephone Encounter (Signed)
 FYI Only or Action Required?: Action required by provider: Refused ED.  Patient was last seen in primary care on 01/05/2024 by Joseph Mini, NP.  Called Nurse Triage reporting Pain, Fatigue, and Neurologic Problem. S/w daughter she reports that pt is unable to sit up, is incoherent, very weak, and may have a fever. Recent amputation.   Symptoms began several weeks ago.  Interventions attempted: Nothing.  Symptoms are: gradually worsening.  Triage Disposition: Go to ED Now (or PCP Triage)  Patient/caregiver understands and will follow disposition?: No Called [facility] to report ED refusal.              Copied from CRM [id number]. 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 Still in alot of pain Reason for Disposition  Patient sounds very sick or weak to the triager  Answer Assessment - Initial Assessment Questions 1. DESCRIPTION: Describe how you are feeling.     Cannot sit up - pt is incoherent 2. SEVERITY: How bad is it?  Can you stand and walk?     no 3. ONSET: When did these symptoms begin? (e.g., hours, days, weeks, months)     After amputation 4. CAUSE: What do you think is causing the weakness or fatigue? (e.g., not drinking enough fluids, medical problem, trouble sleeping)     Unsure 5. NEW MEDICINES:  Have you started on any new medicines recently? (e.g., opioid pain medicines, benzodiazepines, muscle relaxants, antidepressants, antihistamines, neuroleptics, beta blockers)     no 6. OTHER SYMPTOMS: Do you have any other symptoms? (e.g., chest pain, fever, cough, SOB, vomiting, diarrhea, bleeding, other areas of pain)     Incoherent, Pain, weakness  Protocols used: Weakness (Generalized) and Fatigue-A-AH

## 2024-02-27 DIAGNOSIS — K529 Noninfective gastroenteritis and colitis, unspecified: Secondary | ICD-10-CM | POA: Diagnosis not present

## 2024-02-27 DIAGNOSIS — R4182 Altered mental status, unspecified: Secondary | ICD-10-CM | POA: Diagnosis not present

## 2024-02-27 DIAGNOSIS — I4510 Unspecified right bundle-branch block: Secondary | ICD-10-CM | POA: Diagnosis not present

## 2024-02-27 DIAGNOSIS — I491 Atrial premature depolarization: Secondary | ICD-10-CM | POA: Diagnosis not present

## 2024-02-27 NOTE — Telephone Encounter (Signed)
 Patient now admitted to the hospital for AMS, hypokalemia, AKI on CKD, and proctocolitis. If he needs rehab, would urge his daughter to discuss this with the social worker at the hospital as they can facilitate placement in a rehab center at discharge.

## 2024-02-28 DIAGNOSIS — K529 Noninfective gastroenteritis and colitis, unspecified: Secondary | ICD-10-CM | POA: Diagnosis not present

## 2024-02-28 NOTE — Telephone Encounter (Signed)
 I tried to call his daughter's phone it just rings and hangs up. I called his number and the phone just rings. No voicemail.

## 2024-02-29 DIAGNOSIS — K529 Noninfective gastroenteritis and colitis, unspecified: Secondary | ICD-10-CM | POA: Diagnosis not present

## 2024-02-29 NOTE — Telephone Encounter (Signed)
 Attempted call to both patient and patient's daughter lyn- both phones rang without answer. Could not leave a voice mail message.

## 2024-03-01 DIAGNOSIS — K529 Noninfective gastroenteritis and colitis, unspecified: Secondary | ICD-10-CM | POA: Diagnosis not present

## 2024-03-02 DIAGNOSIS — K529 Noninfective gastroenteritis and colitis, unspecified: Secondary | ICD-10-CM | POA: Diagnosis not present

## 2024-03-02 DIAGNOSIS — R809 Proteinuria, unspecified: Secondary | ICD-10-CM | POA: Diagnosis not present

## 2024-03-02 DIAGNOSIS — I10 Essential (primary) hypertension: Secondary | ICD-10-CM | POA: Diagnosis not present

## 2024-03-02 DIAGNOSIS — I2510 Atherosclerotic heart disease of native coronary artery without angina pectoris: Secondary | ICD-10-CM | POA: Diagnosis not present

## 2024-03-02 DIAGNOSIS — N183 Chronic kidney disease, stage 3 unspecified: Secondary | ICD-10-CM | POA: Diagnosis not present

## 2024-03-03 DIAGNOSIS — R809 Proteinuria, unspecified: Secondary | ICD-10-CM | POA: Diagnosis not present

## 2024-03-03 DIAGNOSIS — I10 Essential (primary) hypertension: Secondary | ICD-10-CM | POA: Diagnosis not present

## 2024-03-03 DIAGNOSIS — I2510 Atherosclerotic heart disease of native coronary artery without angina pectoris: Secondary | ICD-10-CM | POA: Diagnosis not present

## 2024-03-03 DIAGNOSIS — N179 Acute kidney failure, unspecified: Secondary | ICD-10-CM | POA: Diagnosis not present

## 2024-03-03 DIAGNOSIS — K529 Noninfective gastroenteritis and colitis, unspecified: Secondary | ICD-10-CM | POA: Diagnosis not present

## 2024-03-03 DIAGNOSIS — N1832 Chronic kidney disease, stage 3b: Secondary | ICD-10-CM | POA: Diagnosis not present

## 2024-03-03 DIAGNOSIS — Z89511 Acquired absence of right leg below knee: Secondary | ICD-10-CM | POA: Diagnosis not present

## 2024-03-03 DIAGNOSIS — N183 Chronic kidney disease, stage 3 unspecified: Secondary | ICD-10-CM | POA: Diagnosis not present

## 2024-03-03 DIAGNOSIS — Z89512 Acquired absence of left leg below knee: Secondary | ICD-10-CM | POA: Diagnosis not present

## 2024-03-04 DIAGNOSIS — K529 Noninfective gastroenteritis and colitis, unspecified: Secondary | ICD-10-CM | POA: Diagnosis not present

## 2024-03-04 DIAGNOSIS — N179 Acute kidney failure, unspecified: Secondary | ICD-10-CM | POA: Diagnosis not present

## 2024-03-04 DIAGNOSIS — I4510 Unspecified right bundle-branch block: Secondary | ICD-10-CM | POA: Diagnosis not present

## 2024-03-04 DIAGNOSIS — I4719 Other supraventricular tachycardia: Secondary | ICD-10-CM | POA: Diagnosis not present

## 2024-03-04 DIAGNOSIS — N183 Chronic kidney disease, stage 3 unspecified: Secondary | ICD-10-CM | POA: Diagnosis not present

## 2024-03-04 DIAGNOSIS — I2510 Atherosclerotic heart disease of native coronary artery without angina pectoris: Secondary | ICD-10-CM | POA: Diagnosis not present

## 2024-03-04 DIAGNOSIS — I10 Essential (primary) hypertension: Secondary | ICD-10-CM | POA: Diagnosis not present

## 2024-03-04 DIAGNOSIS — R809 Proteinuria, unspecified: Secondary | ICD-10-CM | POA: Diagnosis not present

## 2024-03-04 NOTE — Telephone Encounter (Signed)
 Pt still showing admission. Per note, it has been agreeable for pt to go to SNF.

## 2024-03-05 DIAGNOSIS — I2510 Atherosclerotic heart disease of native coronary artery without angina pectoris: Secondary | ICD-10-CM | POA: Diagnosis not present

## 2024-03-05 DIAGNOSIS — R809 Proteinuria, unspecified: Secondary | ICD-10-CM | POA: Diagnosis not present

## 2024-03-05 DIAGNOSIS — N183 Chronic kidney disease, stage 3 unspecified: Secondary | ICD-10-CM | POA: Diagnosis not present

## 2024-03-05 DIAGNOSIS — K529 Noninfective gastroenteritis and colitis, unspecified: Secondary | ICD-10-CM | POA: Diagnosis not present

## 2024-03-05 DIAGNOSIS — I10 Essential (primary) hypertension: Secondary | ICD-10-CM | POA: Diagnosis not present

## 2024-03-06 DIAGNOSIS — M6281 Muscle weakness (generalized): Secondary | ICD-10-CM | POA: Diagnosis not present

## 2024-03-06 DIAGNOSIS — N179 Acute kidney failure, unspecified: Secondary | ICD-10-CM | POA: Diagnosis not present

## 2024-03-06 DIAGNOSIS — E11621 Type 2 diabetes mellitus with foot ulcer: Secondary | ICD-10-CM | POA: Diagnosis not present

## 2024-03-06 DIAGNOSIS — E119 Type 2 diabetes mellitus without complications: Secondary | ICD-10-CM | POA: Diagnosis not present

## 2024-03-06 DIAGNOSIS — Z89512 Acquired absence of left leg below knee: Secondary | ICD-10-CM | POA: Diagnosis not present

## 2024-03-06 DIAGNOSIS — E1159 Type 2 diabetes mellitus with other circulatory complications: Secondary | ICD-10-CM | POA: Diagnosis not present

## 2024-03-06 DIAGNOSIS — E11628 Type 2 diabetes mellitus with other skin complications: Secondary | ICD-10-CM | POA: Diagnosis not present

## 2024-03-06 DIAGNOSIS — I2510 Atherosclerotic heart disease of native coronary artery without angina pectoris: Secondary | ICD-10-CM | POA: Diagnosis not present

## 2024-03-06 DIAGNOSIS — N183 Chronic kidney disease, stage 3 unspecified: Secondary | ICD-10-CM | POA: Diagnosis not present

## 2024-03-06 DIAGNOSIS — K529 Noninfective gastroenteritis and colitis, unspecified: Secondary | ICD-10-CM | POA: Diagnosis not present

## 2024-03-06 DIAGNOSIS — M869 Osteomyelitis, unspecified: Secondary | ICD-10-CM | POA: Diagnosis not present

## 2024-03-06 DIAGNOSIS — I10 Essential (primary) hypertension: Secondary | ICD-10-CM | POA: Diagnosis not present

## 2024-03-06 DIAGNOSIS — E1151 Type 2 diabetes mellitus with diabetic peripheral angiopathy without gangrene: Secondary | ICD-10-CM | POA: Diagnosis not present

## 2024-03-06 DIAGNOSIS — D72829 Elevated white blood cell count, unspecified: Secondary | ICD-10-CM | POA: Diagnosis not present

## 2024-03-06 DIAGNOSIS — K5229 Other allergic and dietetic gastroenteritis and colitis: Secondary | ICD-10-CM | POA: Diagnosis not present

## 2024-03-06 DIAGNOSIS — R278 Other lack of coordination: Secondary | ICD-10-CM | POA: Diagnosis not present

## 2024-03-06 DIAGNOSIS — Z743 Need for continuous supervision: Secondary | ICD-10-CM | POA: Diagnosis not present

## 2024-03-06 DIAGNOSIS — I639 Cerebral infarction, unspecified: Secondary | ICD-10-CM | POA: Diagnosis not present

## 2024-03-06 DIAGNOSIS — I63412 Cerebral infarction due to embolism of left middle cerebral artery: Secondary | ICD-10-CM | POA: Diagnosis not present

## 2024-03-06 DIAGNOSIS — F4489 Other dissociative and conversion disorders: Secondary | ICD-10-CM | POA: Diagnosis not present

## 2024-03-06 DIAGNOSIS — L97819 Non-pressure chronic ulcer of other part of right lower leg with unspecified severity: Secondary | ICD-10-CM | POA: Diagnosis not present

## 2024-03-06 DIAGNOSIS — E876 Hypokalemia: Secondary | ICD-10-CM | POA: Diagnosis not present

## 2024-03-06 DIAGNOSIS — J449 Chronic obstructive pulmonary disease, unspecified: Secondary | ICD-10-CM | POA: Diagnosis not present

## 2024-03-06 DIAGNOSIS — I739 Peripheral vascular disease, unspecified: Secondary | ICD-10-CM | POA: Diagnosis not present

## 2024-03-06 DIAGNOSIS — N1832 Chronic kidney disease, stage 3b: Secondary | ICD-10-CM | POA: Diagnosis not present

## 2024-03-06 DIAGNOSIS — Z89511 Acquired absence of right leg below knee: Secondary | ICD-10-CM | POA: Diagnosis not present

## 2024-03-06 DIAGNOSIS — R809 Proteinuria, unspecified: Secondary | ICD-10-CM | POA: Diagnosis not present

## 2024-03-07 DIAGNOSIS — N1832 Chronic kidney disease, stage 3b: Secondary | ICD-10-CM | POA: Diagnosis not present

## 2024-03-07 DIAGNOSIS — M869 Osteomyelitis, unspecified: Secondary | ICD-10-CM | POA: Diagnosis not present

## 2024-03-07 DIAGNOSIS — I739 Peripheral vascular disease, unspecified: Secondary | ICD-10-CM | POA: Diagnosis not present

## 2024-03-07 DIAGNOSIS — E119 Type 2 diabetes mellitus without complications: Secondary | ICD-10-CM | POA: Diagnosis not present

## 2024-03-08 DIAGNOSIS — N1832 Chronic kidney disease, stage 3b: Secondary | ICD-10-CM | POA: Diagnosis not present

## 2024-03-08 DIAGNOSIS — I2510 Atherosclerotic heart disease of native coronary artery without angina pectoris: Secondary | ICD-10-CM | POA: Diagnosis not present

## 2024-03-08 DIAGNOSIS — I639 Cerebral infarction, unspecified: Secondary | ICD-10-CM | POA: Diagnosis not present

## 2024-03-08 DIAGNOSIS — E119 Type 2 diabetes mellitus without complications: Secondary | ICD-10-CM | POA: Diagnosis not present

## 2024-03-12 DIAGNOSIS — I639 Cerebral infarction, unspecified: Secondary | ICD-10-CM | POA: Diagnosis not present

## 2024-03-12 DIAGNOSIS — J449 Chronic obstructive pulmonary disease, unspecified: Secondary | ICD-10-CM | POA: Diagnosis not present

## 2024-03-12 DIAGNOSIS — N1832 Chronic kidney disease, stage 3b: Secondary | ICD-10-CM | POA: Diagnosis not present

## 2024-03-12 DIAGNOSIS — E119 Type 2 diabetes mellitus without complications: Secondary | ICD-10-CM | POA: Diagnosis not present

## 2024-03-13 DIAGNOSIS — I739 Peripheral vascular disease, unspecified: Secondary | ICD-10-CM | POA: Diagnosis not present

## 2024-03-13 DIAGNOSIS — N179 Acute kidney failure, unspecified: Secondary | ICD-10-CM | POA: Diagnosis not present

## 2024-03-13 DIAGNOSIS — E119 Type 2 diabetes mellitus without complications: Secondary | ICD-10-CM | POA: Diagnosis not present

## 2024-03-13 DIAGNOSIS — K5229 Other allergic and dietetic gastroenteritis and colitis: Secondary | ICD-10-CM | POA: Diagnosis not present

## 2024-03-13 DIAGNOSIS — Z89512 Acquired absence of left leg below knee: Secondary | ICD-10-CM | POA: Diagnosis not present

## 2024-03-13 DIAGNOSIS — M6281 Muscle weakness (generalized): Secondary | ICD-10-CM | POA: Diagnosis not present

## 2024-03-13 DIAGNOSIS — E876 Hypokalemia: Secondary | ICD-10-CM | POA: Diagnosis not present

## 2024-03-13 DIAGNOSIS — E11628 Type 2 diabetes mellitus with other skin complications: Secondary | ICD-10-CM | POA: Diagnosis not present

## 2024-03-13 DIAGNOSIS — R278 Other lack of coordination: Secondary | ICD-10-CM | POA: Diagnosis not present

## 2024-03-13 DIAGNOSIS — M869 Osteomyelitis, unspecified: Secondary | ICD-10-CM | POA: Diagnosis not present

## 2024-03-13 DIAGNOSIS — D72829 Elevated white blood cell count, unspecified: Secondary | ICD-10-CM | POA: Diagnosis not present

## 2024-03-18 DIAGNOSIS — E11628 Type 2 diabetes mellitus with other skin complications: Secondary | ICD-10-CM | POA: Diagnosis not present

## 2024-03-18 DIAGNOSIS — M6281 Muscle weakness (generalized): Secondary | ICD-10-CM | POA: Diagnosis not present

## 2024-03-18 DIAGNOSIS — R278 Other lack of coordination: Secondary | ICD-10-CM | POA: Diagnosis not present

## 2024-03-18 DIAGNOSIS — I739 Peripheral vascular disease, unspecified: Secondary | ICD-10-CM | POA: Diagnosis not present

## 2024-03-18 DIAGNOSIS — Z89512 Acquired absence of left leg below knee: Secondary | ICD-10-CM | POA: Diagnosis not present

## 2024-03-18 DIAGNOSIS — E119 Type 2 diabetes mellitus without complications: Secondary | ICD-10-CM | POA: Diagnosis not present

## 2024-03-18 DIAGNOSIS — M869 Osteomyelitis, unspecified: Secondary | ICD-10-CM | POA: Diagnosis not present

## 2024-03-19 DIAGNOSIS — N1832 Chronic kidney disease, stage 3b: Secondary | ICD-10-CM | POA: Diagnosis not present

## 2024-03-19 DIAGNOSIS — I639 Cerebral infarction, unspecified: Secondary | ICD-10-CM | POA: Diagnosis not present

## 2024-03-19 DIAGNOSIS — L97819 Non-pressure chronic ulcer of other part of right lower leg with unspecified severity: Secondary | ICD-10-CM | POA: Diagnosis not present

## 2024-03-19 DIAGNOSIS — E11621 Type 2 diabetes mellitus with foot ulcer: Secondary | ICD-10-CM | POA: Diagnosis not present

## 2024-03-19 DIAGNOSIS — J449 Chronic obstructive pulmonary disease, unspecified: Secondary | ICD-10-CM | POA: Diagnosis not present

## 2024-03-19 DIAGNOSIS — I739 Peripheral vascular disease, unspecified: Secondary | ICD-10-CM | POA: Diagnosis not present

## 2024-03-19 DIAGNOSIS — N183 Chronic kidney disease, stage 3 unspecified: Secondary | ICD-10-CM | POA: Diagnosis not present

## 2024-03-19 DIAGNOSIS — E119 Type 2 diabetes mellitus without complications: Secondary | ICD-10-CM | POA: Diagnosis not present

## 2024-03-19 NOTE — Telephone Encounter (Signed)
 Reached out to patient again- no answer or v/m to leave a message.

## 2024-03-20 ENCOUNTER — Other Ambulatory Visit: Payer: Self-pay

## 2024-03-20 DIAGNOSIS — M6281 Muscle weakness (generalized): Secondary | ICD-10-CM | POA: Diagnosis not present

## 2024-03-20 DIAGNOSIS — E11628 Type 2 diabetes mellitus with other skin complications: Secondary | ICD-10-CM | POA: Diagnosis not present

## 2024-03-20 DIAGNOSIS — M869 Osteomyelitis, unspecified: Secondary | ICD-10-CM | POA: Diagnosis not present

## 2024-03-20 DIAGNOSIS — Z89512 Acquired absence of left leg below knee: Secondary | ICD-10-CM | POA: Diagnosis not present

## 2024-03-20 DIAGNOSIS — R278 Other lack of coordination: Secondary | ICD-10-CM | POA: Diagnosis not present

## 2024-03-20 DIAGNOSIS — I739 Peripheral vascular disease, unspecified: Secondary | ICD-10-CM | POA: Diagnosis not present

## 2024-03-20 DIAGNOSIS — E119 Type 2 diabetes mellitus without complications: Secondary | ICD-10-CM | POA: Diagnosis not present

## 2024-03-22 DIAGNOSIS — I639 Cerebral infarction, unspecified: Secondary | ICD-10-CM | POA: Diagnosis not present

## 2024-03-22 DIAGNOSIS — N1832 Chronic kidney disease, stage 3b: Secondary | ICD-10-CM | POA: Diagnosis not present

## 2024-03-22 DIAGNOSIS — J449 Chronic obstructive pulmonary disease, unspecified: Secondary | ICD-10-CM | POA: Diagnosis not present

## 2024-03-22 DIAGNOSIS — E119 Type 2 diabetes mellitus without complications: Secondary | ICD-10-CM | POA: Diagnosis not present

## 2024-03-25 ENCOUNTER — Telehealth: Payer: Self-pay

## 2024-03-25 NOTE — Transitions of Care (Post Inpatient/ED Visit) (Unsigned)
   03/25/2024  Name: Joseph Grimes MRN: [id number] DOB: September 10, 1960  Today's TOC FU Call Status: Today's TOC FU Call Status:: Unsuccessful Call (1st Attempt) Unsuccessful Call (1st Attempt) Date: 03/25/24  Attempted to reach the patient regarding the most recent Inpatient/ED visit.  Follow Up Plan: Additional outreach attempts will be made to reach the patient to complete the Transitions of Care (Post Inpatient/ED visit) call.   Signature Julian Lemmings, LPN [facility] Nurse Health Advisor Direct Dial [phone]

## 2024-03-26 ENCOUNTER — Inpatient Hospital Stay: Admitting: Medical-Surgical

## 2024-03-26 NOTE — Transitions of Care (Post Inpatient/ED Visit) (Unsigned)
   03/26/2024  Name: Joseph Grimes MRN: [id number] DOB: [birth year]  Today's TOC FU Call Status: Today's TOC FU Call Status:: Unsuccessful Call (2nd Attempt) Unsuccessful Call (1st Attempt) Date: 03/25/24 Unsuccessful Call (2nd Attempt) Date: 03/26/24  Attempted to reach the patient regarding the most recent Inpatient/ED visit.  Follow Up Plan: Additional outreach attempts will be made to reach the patient to complete the Transitions of Care (Post Inpatient/ED visit) call.   Signature Julian Lemmings, LPN [facility] Nurse Health Advisor Direct Dial [phone]

## 2024-03-26 NOTE — Progress Notes (Deleted)
        Established patient visit   History of Present Illness   Discussed the use of AI scribe software for clinical note transcription with the patient, who gave verbal consent to proceed.  History of Present Illness            Physical Exam   Physical Exam  Assessment & Plan   Assessment and Plan               Follow up   No follow-ups on file.  __________________________________ Joseph FREDRIK Palin, DNP, APRN, FNP-BC Primary Care and Sports Medicine [facility] [place]

## 2024-03-27 NOTE — Transitions of Care (Post Inpatient/ED Visit) (Signed)
   03/27/2024  Name: Joseph Grimes MRN: [id number] DOB: June 14, 1961  Today's TOC FU Call Status: Today's TOC FU Call Status:: Unsuccessful Call (3rd Attempt) Unsuccessful Call (1st Attempt) Date: 03/25/24 Unsuccessful Call (2nd Attempt) Date: 03/26/24 Unsuccessful Call (3rd Attempt) Date: 03/27/24  Attempted to reach the patient regarding the most recent Inpatient/ED visit.  Follow Up Plan: No further outreach attempts will be made at this time. We have been unable to contact the patient.  Signature Julian Lemmings, LPN [facility] Nurse Health Advisor Direct Dial [phone]

## 2024-04-03 NOTE — Telephone Encounter (Signed)
 After reaching out multiple times with no response for PAP application for Farxiga . I will notify PharmD of closing encounter.

## 2024-04-05 ENCOUNTER — Ambulatory Visit: Admitting: Medical-Surgical

## 2024-04-11 ENCOUNTER — Other Ambulatory Visit: Payer: Self-pay | Admitting: Medical-Surgical

## 2024-04-11 DIAGNOSIS — F1190 Opioid use, unspecified, uncomplicated: Secondary | ICD-10-CM

## 2024-04-11 NOTE — Telephone Encounter (Unsigned)
 Copied from CRM [id number]. Topic: Clinical - Medication Refill >> Apr 11, 2024  1:59 PM Laurier C wrote: Medication: Oxycodone  HCl 10 MG TABS  Has the patient contacted their pharmacy? No   This is the patient's preferred pharmacy:   [id number] - [place], [place] - [address] AT [facility] [address] [place] [address] Phone: [phone] Fax: [phone]  Is this the correct pharmacy for this prescription? Yes If no, delete pharmacy and type the correct one.   Has the prescription been filled recently? No  Is the patient out of the medication? Yes  Has the patient been seen for an appointment in the last year OR does the patient have an upcoming appointment? Yes  Can we respond through MyChart? Yes  Agent: Please be advised that Rx refills may take up to 3 business days. We ask that you follow-up with your pharmacy.

## 2024-04-22 ENCOUNTER — Ambulatory Visit: Admitting: Physician Assistant

## 2024-04-23 ENCOUNTER — Encounter: Payer: Self-pay | Admitting: Medical-Surgical

## 2024-04-23 ENCOUNTER — Ambulatory Visit (INDEPENDENT_AMBULATORY_CARE_PROVIDER_SITE_OTHER): Admitting: Medical-Surgical

## 2024-04-23 VITALS — BP 163/68 | HR 78 | Resp 20 | Ht 69.0 in

## 2024-04-23 DIAGNOSIS — L03113 Cellulitis of right upper limb: Secondary | ICD-10-CM | POA: Diagnosis not present

## 2024-04-23 DIAGNOSIS — F1190 Opioid use, unspecified, uncomplicated: Secondary | ICD-10-CM

## 2024-04-23 DIAGNOSIS — Z89512 Acquired absence of left leg below knee: Secondary | ICD-10-CM

## 2024-04-23 DIAGNOSIS — Z89511 Acquired absence of right leg below knee: Secondary | ICD-10-CM

## 2024-04-23 DIAGNOSIS — I10 Essential (primary) hypertension: Secondary | ICD-10-CM

## 2024-04-23 MED ORDER — OXYCODONE HCL 10 MG PO TABS: 5.0000 mg | ORAL_TABLET | Freq: Four times a day (QID) | ORAL | 0 refills | Status: DC | PRN

## 2024-04-23 MED ORDER — DOXYCYCLINE HYCLATE 100 MG PO TABS: 100.0000 mg | ORAL_TABLET | Freq: Two times a day (BID) | ORAL | 0 refills | Status: AC

## 2024-04-23 NOTE — Progress Notes (Unsigned)
        Established patient visit   History of Present Illness   Discussed the use of AI scribe software for clinical note transcription with the patient, who gave verbal consent to proceed.  History of Present Illness            Physical Exam   Physical Exam  Assessment & Plan   Assessment and Plan               Follow up   No follow-ups on file.  __________________________________ Joseph Grimes Palin, DNP, APRN, FNP-BC Primary Care and Sports Medicine [facility] [place]

## 2024-04-24 ENCOUNTER — Telehealth: Payer: Self-pay

## 2024-04-24 ENCOUNTER — Other Ambulatory Visit (HOSPITAL_COMMUNITY): Payer: Self-pay

## 2024-04-24 DIAGNOSIS — L03113 Cellulitis of right upper limb: Secondary | ICD-10-CM | POA: Insufficient documentation

## 2024-04-24 DIAGNOSIS — Z89511 Acquired absence of right leg below knee: Secondary | ICD-10-CM | POA: Insufficient documentation

## 2024-04-24 NOTE — Assessment & Plan Note (Addendum)
 Right hand swelling and pain consistent with cellulitis. Risk of severe infection/sepsis if untreated. - Prescribed doxycycline  for 7 days. - Advised covering the scabbed area with Band-Aid to prevent trauma.

## 2024-04-24 NOTE — Telephone Encounter (Signed)
 Pharmacy Patient Advocate Encounter   Received notification from Patient Pharmacy that prior authorization for Oxycodone  10mg  tabs is required/requested.   Insurance verification completed.   The patient is insured through [employer] .   Per test claim: PA required; PA submitted to above mentioned insurance via Latent Key/confirmation #/EOC [facility] Status is pending

## 2024-04-24 NOTE — Assessment & Plan Note (Addendum)
 Issues with home health services due to noncompliance marking and communication problems. - Contacted [facility] to resolve service issues. - Follow up with Ortho/orthopedic surgery for wound care and staple removal. - Planned to update medication list upon receiving rehab records.

## 2024-04-24 NOTE — Assessment & Plan Note (Signed)
 Chronic pain managed with oxycodone  10mg  every 6 hours prn. Pharmacy discrepancy noted in PDMP. Possibly related to rehab stay. Discussed opioid agreement and prescription limits. - Prescribed 60 oxycodone  tablets for 90 days to [facility]. - Ensured opioid agreement is current. - Reviewed recommendation for pain management referral if quantity prescribed is insufficient. - Scheduled three-month follow-up for pain management review.

## 2024-04-24 NOTE — Assessment & Plan Note (Signed)
 Blood pressure elevated on arrival and again on recheck. Per med list, on Toprol-XL and Lisinopril  for management. Unclear if hypertensive medications were changed in rehab as patient is a poor historian.  - Requesting records to update his med list so changes can be made safely.  - Continue Toprol-XL and Lisinopril . - Take today's dose as soon as possible.

## 2024-04-25 ENCOUNTER — Other Ambulatory Visit (HOSPITAL_COMMUNITY): Payer: Self-pay

## 2024-04-25 NOTE — Telephone Encounter (Signed)
 Pharmacy Patient Advocate Encounter  Received notification from [employer] that Prior Authorization for Oxycodone  10mg  tabs has been DENIED.  See denial reason below. No denial letter attached in CMM. Will attach denial letter to Media tab once received.   PA #/Case ID/Reference #: [id number]   I left a message at [facility] to notify them that the insurance would not do a 90 day supply but will have to make documentation that it is to last for 90 days.

## 2024-05-21 ENCOUNTER — Other Ambulatory Visit: Payer: Self-pay | Admitting: Medical-Surgical

## 2024-05-21 DIAGNOSIS — L03113 Cellulitis of right upper limb: Secondary | ICD-10-CM

## 2024-05-21 NOTE — Telephone Encounter (Signed)
 Refill of Doxycycline  inappropriate. Depakote  should be managed by Neurology.

## 2024-05-21 NOTE — Telephone Encounter (Signed)
 Please advise if the refills for the following medications are appropriate to send to the pharmacy. Thanks in advance.

## 2024-06-20 ENCOUNTER — Telehealth: Payer: Self-pay

## 2024-06-20 NOTE — Telephone Encounter (Signed)
 Message sent to patient with information as below: [place], Ulanda Norris, MD   Elbridge Signe Sayer, MD   Braxton Goo, MD   [facility]  Roseanne Ulanda Norris, MD  [address]  [address]  [place], [place] [address]  Phone: tel:[phone]  [phone]      Reason for Visit - Auth/Cert (Routine) Referral ID Status Reason Start Date Expiration Date Visits Requested Visits Authorized  [id number]         1 1    Encounter Details  Encounter Details Date Type Department Care Team (Latest Contact Info) Description  02/06/2024 11:09 AM EDT - 02/12/2024 4:29 PM EDT Hospital Encounter [facility] - [facility] MEDICAL SURGICAL UNIT  [address]  [place], [place] [address]  [phone]  Roseanne Ulanda Norris, MD  [address]  [address]  [place], [place] [address]  [phone] (Work)  [phone] (Fax)    Elbridge Signe Sayer, MD  [facility] [address]  [place], [place] [address]  [phone] (Work)  [phone] (Fax)    Braxton Goo, MD  [address] [place], [place] [address]  [phone] (Work)  [phone] (Fax)     I  have included the contact information provided in the notes as above.  Thank you,  Luke Plenty, LPN

## 2024-06-20 NOTE — Telephone Encounter (Signed)
 Copied from CRM [id number]. Topic: Clinical - Medical Advice >> Jun 19, 2024  3:31 PM Delon DASEN wrote: Reason for CRM: Patient still has staples in leg from amputation in July or August, needs to know the name of the surgeon so he can schedule an appt to  have them removed- [phone]

## 2024-06-25 DIAGNOSIS — Z89511 Acquired absence of right leg below knee: Secondary | ICD-10-CM | POA: Diagnosis not present

## 2024-06-25 DIAGNOSIS — Z89512 Acquired absence of left leg below knee: Secondary | ICD-10-CM | POA: Diagnosis not present

## 2024-07-23 ENCOUNTER — Ambulatory Visit: Admitting: Medical-Surgical

## 2024-07-25 ENCOUNTER — Ambulatory Visit: Admitting: Medical-Surgical

## 2024-07-25 VITALS — BP 156/77 | HR 98 | Resp 20 | Ht 69.0 in

## 2024-07-25 DIAGNOSIS — I10 Essential (primary) hypertension: Secondary | ICD-10-CM | POA: Diagnosis not present

## 2024-07-25 DIAGNOSIS — E1140 Type 2 diabetes mellitus with diabetic neuropathy, unspecified: Secondary | ICD-10-CM | POA: Diagnosis not present

## 2024-07-25 DIAGNOSIS — Z794 Long term (current) use of insulin: Secondary | ICD-10-CM | POA: Diagnosis not present

## 2024-07-25 DIAGNOSIS — R300 Dysuria: Secondary | ICD-10-CM

## 2024-07-25 DIAGNOSIS — S61511A Laceration without foreign body of right wrist, initial encounter: Secondary | ICD-10-CM

## 2024-07-25 DIAGNOSIS — G8929 Other chronic pain: Secondary | ICD-10-CM

## 2024-07-25 DIAGNOSIS — F1190 Opioid use, unspecified, uncomplicated: Secondary | ICD-10-CM | POA: Diagnosis not present

## 2024-07-25 LAB — POCT UA - MICROALBUMIN
Albumin/Creatinine Ratio, Urine, POC: >300
Creatinine, POC: 50 mg/dL
Microalbumin Ur, POC: 150 mg/L

## 2024-07-25 LAB — POCT URINALYSIS DIP (CLINITEK)
Bilirubin, UA: NEGATIVE
Glucose, UA: >=1000 mg/dL — AB
Ketones, POC UA: NEGATIVE mg/dL
Nitrite, UA: NEGATIVE
POC PROTEIN,UA: >=300 — AB
Spec Grav, UA: 1.015 (ref 1.010–1.025)
Urobilinogen, UA: 0.2 U/dL
pH, UA: 6 (ref 5.0–8.0)

## 2024-07-25 LAB — POCT GLYCOSYLATED HEMOGLOBIN (HGB A1C)
HbA1c, POC (controlled diabetic range): 14.7 % — AB (ref 0.0–7.0)
Hemoglobin A1C: 14.7 % — AB (ref 4.0–5.6)

## 2024-07-25 MED ORDER — LISINOPRIL 10 MG PO TABS: 10.0000 mg | ORAL_TABLET | Freq: Every day | ORAL | 3 refills | Status: AC

## 2024-07-25 MED ORDER — OXYCODONE HCL 10 MG PO TABS: 5.0000 mg | ORAL_TABLET | Freq: Four times a day (QID) | ORAL | 0 refills | Status: AC | PRN

## 2024-07-25 MED ORDER — PREGABALIN 100 MG PO CAPS: 100.0000 mg | ORAL_CAPSULE | Freq: Three times a day (TID) | ORAL | 2 refills | Status: AC

## 2024-07-25 MED ORDER — METHOCARBAMOL 500 MG PO TABS: 500.0000 mg | ORAL_TABLET | Freq: Three times a day (TID) | ORAL | 1 refills | Status: AC

## 2024-07-25 MED ORDER — ROSUVASTATIN CALCIUM 40 MG PO TABS: 40.0000 mg | ORAL_TABLET | Freq: Every day | ORAL | 3 refills | Status: AC

## 2024-07-25 MED ORDER — NITROFURANTOIN MONOHYD MACRO 100 MG PO CAPS: 100.0000 mg | ORAL_CAPSULE | Freq: Two times a day (BID) | ORAL | 0 refills | Status: AC

## 2024-07-25 MED ORDER — GABAPENTIN 300 MG PO CAPS: 300.0000 mg | ORAL_CAPSULE | Freq: Three times a day (TID) | ORAL | 3 refills | Status: AC

## 2024-07-25 MED ORDER — LEVETIRACETAM 500 MG PO TABS: 500.0000 mg | ORAL_TABLET | Freq: Two times a day (BID) | ORAL | 1 refills | Status: AC

## 2024-07-25 MED ORDER — TOUJEO MAX SOLOSTAR 300 UNIT/ML ~~LOC~~ SOPN: 10.0000 [IU] | PEN_INJECTOR | Freq: Every evening | SUBCUTANEOUS | 2 refills | Status: AC

## 2024-07-25 MED ORDER — DAPAGLIFLOZIN PROPANEDIOL 10 MG PO TABS: 10.0000 mg | ORAL_TABLET | Freq: Every day | ORAL | 3 refills | Status: AC

## 2024-07-25 NOTE — Progress Notes (Cosign Needed Addendum)
 Established Patient Office Visit  Subjective   Patient ID: Joseph Grimes, male    DOB: 12-Feb-1961  Age: 64 y.o. MRN: [id number]  Chief Complaint  Patient presents with   Hypertension   Diabetes    HPI  63 year old male presents to the office for chronic disease follow up  Type 2 DM Patient is not currently taking any medications for blood sugars at this time. He states that he has not taken any medication since August. He was previously taking Toujeo  12 units before stopping due to hypoglycemia episodes. Does not check his blood sugars routinely.  Hypertension Patient is not currently currently taking any blood pressure medications. Reports he has been off of his medications for over 3 months. He does not routinely check is blood pressures at home. Denies having any chests pain, heart palpitation, dizziness, or shortness of breath.  Chronic Pain Continues to take Lyrica  100 mg three times daily, gabapentin  300 mg three times per day, and Oxycodone  10 mg every 6 hours as needed.   Review of Systems  Constitutional: Negative.   HENT: Negative.    Eyes: Negative.   Respiratory: Negative.    Cardiovascular: Negative.   Gastrointestinal: Negative.   Genitourinary: Negative.   Musculoskeletal:  Positive for joint pain.       Phantom pain in legs and hips  Skin: Negative.        Skin tear on right hand  Neurological: Negative.   Endo/Heme/Allergies: Negative.   Psychiatric/Behavioral: Negative.        Objective:     BP (!) 156/77 (BP Location: Right Arm, Cuff Size: Normal)   Pulse 98   Resp 20   Ht 5' 9 (1.753 m)   SpO2 98%   BMI 21.12 kg/m  BP Readings from Last 3 Encounters:  07/25/24 (!) 156/77  04/23/24 (!) 163/68  01/05/24 129/75   Wt Readings from Last 3 Encounters:  10/16/23 64.9 kg  05/31/23 68.5 kg  05/18/23 72.6 kg      Physical Exam Vitals and nursing note reviewed.  Constitutional:      General: He is not in acute distress.     Appearance: Normal appearance.  Cardiovascular:     Rate and Rhythm: Normal rate and regular rhythm.     Pulses: Normal pulses.     Heart sounds: Normal heart sounds.  Pulmonary:     Effort: Pulmonary effort is normal.     Breath sounds: Normal breath sounds.  Skin:    Findings: Wound present.     Comments: 3 cm skin tear on right wrist with erythema and bleeding.  Neurological:     General: No focal deficit present.     Mental Status: He is alert and oriented to person, place, and time.  Psychiatric:        Mood and Affect: Mood normal.        Behavior: Behavior normal.        Thought Content: Thought content normal.        Judgment: Judgment normal.      Results for orders placed or performed in visit on 07/25/24  POCT HgB A1C  Result Value Ref Range   Hemoglobin A1C 14.7 (A) 4.0 - 5.6 %   HbA1c POC (<> result, manual entry)     HbA1c, POC (prediabetic range)     HbA1c, POC (controlled diabetic range) 14.7 (A) 0.0 - 7.0 %  POCT UA - Microalbumin  Result Value Ref Range  Microalbumin Ur, POC 150 mg/L   Creatinine, POC 50 mg/dL   Albumin/Creatinine Ratio, Urine, POC >300   POCT URINALYSIS DIP (CLINITEK)  Result Value Ref Range   Color, UA other (A) yellow   Clarity, UA cloudy (A) clear   Glucose, UA >=1,000 (A) negative mg/dL   Bilirubin, UA negative negative   Ketones, POC UA negative negative mg/dL   Spec Grav, UA 8.984 8.989 - 1.025   Blood, UA moderate (A) negative   pH, UA 6.0 5.0 - 8.0   POC PROTEIN,UA >=300 (A) negative, trace   Urobilinogen, UA 0.2 0.2 or 1.0 E.U./dL   Nitrite, UA Negative Negative   Leukocytes, UA Trace (A) Negative    Last CBC Lab Results  Component Value Date   WBC 10.8 05/31/2023   HGB 8.3 (L) 05/31/2023   HCT 26.0 (L) 05/31/2023   MCV 98 (H) 05/31/2023   MCH 31.2 05/31/2023   RDW 13.4 05/31/2023   PLT 436 05/31/2023   Last metabolic panel Lab Results  Component Value Date   GLUCOSE 190 (H) 05/31/2023   NA 139 05/31/2023    K 5.4 (H) 05/31/2023   CL 104 05/31/2023   CO2 22 05/31/2023   BUN 41 (H) 05/31/2023   CREATININE 2.02 (H) 05/31/2023   EGFR 36 (L) 05/31/2023   CALCIUM  8.7 05/31/2023   PROT 6.2 05/31/2023   ALBUMIN 2.8 (L) 05/31/2023   LABGLOB 3.4 05/31/2023   BILITOT 0.3 05/31/2023   ALKPHOS 306 (H) 05/31/2023   AST 23 05/31/2023   ALT 24 05/31/2023   Last lipids Lab Results  Component Value Date   CHOL 101 12/02/2021   HDL 48 12/02/2021   LDLCALC 37 12/02/2021   TRIG 75 12/02/2021   CHOLHDL 2.1 12/02/2021   Last hemoglobin A1c Lab Results  Component Value Date   HGBA1C 14.7 (A) 07/25/2024   HGBA1C 14.7 (A) 07/25/2024      The ASCVD Risk score (Arnett DK, et al., 2019) failed to calculate for the following reasons:   Risk score cannot be calculated because patient has a medical history suggesting prior/existing ASCVD   * - Cholesterol units were assumed    Assessment & Plan:   1. Essential hypertension (Primary) -Restart Lisinopril  10 mg daily -Initial BP 160/77 and repeat 156/77 -Follow up in 2 weeks for nurse visit BP check  2. Type 2 diabetes mellitus with diabetic neuropathy, with long-term current use of insulin  (HCC) -Restart Toujeo  10 units nightly. Increase by 2 units every 4 days until fasting blood sugars are normal - POCT HgB A1C - POCT UA - Microalbumin  3. Chronic, continuous use of opioids - Continue taking oxycodone  as needed for pain -Discussed the importance of making his prescription last the full 90 days - Oxycodone  HCl 10 MG TABS; Take 0.5-1 tablets (5-10 mg total) by mouth every 6 (six) hours as needed. #60 tabs to last 90 days. NO early refills  Dispense: 60 tablet; Refill: 0 - Drug Screen 12+Alcohol+CRT, Ur  4. Other chronic pain -Continue with current regimen Lyrica  100 mg 3 times daily, gabapentin  300 mg 3 times daily, and Oxycodone  10 mg as needed for pain management - pregabalin  (LYRICA ) 100 MG capsule; Take 1 capsule (100 mg total) by mouth 3  (three) times daily.  Dispense: 90 capsule; Refill: 2 - Drug Screen 12+Alcohol+CRT, Ur  5. Tear of skin of right wrist, initial encounter -Applied triple antibiotic ointment to the area and wrapped with a non-adherent dressing followed by gauze wrap  and coban.  6. Dysuria -POCT urinalysis + mod blood, trace leuks, and + protein -urine culture sent - Macrobid  rx sent to the pharmacy to treat empirically for UTI  - POCT URINALYSIS DIP (CLINITEK) - Urine Culture   Return in about 2 weeks (around 08/08/2024) for Nurse Visit BP Check.    Derrek JINNY Freund, NP Student

## 2024-07-25 NOTE — Progress Notes (Signed)
 Patient reports that his daughter has been requesting refills but they were never sent. On review of the medical record, only two refill encounters were found. These were for oxycodone  which he was due to follow up on and was sent at his visit on 9/16. The others were for an antibiotic which was not appropriate and for Depakote  that was supposed to be managed by Neurology. Suspect the pharmacy was sending refills to the hospital discharge provider who filled them last. Explained this to patient and requested if future issues, notify the office directly as soon as possible.   A1c completely uncontrolled today at 14.7% with urine microalbumin showing high abnormal. Discussed the importance of resuming insulin  and farxiga  for glucose management. Dysuria reported as tingling with urination likely related to spilling a large amount of glucose in the urine but with his uncontrolled diabetes, at high risk for UTI. Starting Macrobid  today empirically but will tailor treatment depending on culture results.   Restarting prior medications for management of chronic conditions. Patient verbalized understanding of treatment recommendations and will restart the medications as instructed.   Medical screening examination/treatment was performed by qualified clinical staff member and as supervising provider I was immediately available for consultation/collaboration. I have reviewed documentation and agree with assessment and plan.  Zada FREDRIK Palin, DNP, APRN, FNP-BC [facility] and Sports Medicine

## 2024-07-29 ENCOUNTER — Ambulatory Visit: Payer: Self-pay | Admitting: Medical-Surgical

## 2024-07-29 LAB — URINE CULTURE

## 2024-07-29 MED ORDER — SULFAMETHOXAZOLE-TRIMETHOPRIM 800-160 MG PO TABS: 1.0000 | ORAL_TABLET | Freq: Two times a day (BID) | ORAL | 0 refills | Status: AC

## 2024-08-09 ENCOUNTER — Ambulatory Visit

## 2024-08-09 NOTE — Progress Notes (Deleted)
" ° °  Subjective:    Patient ID: Joseph Grimes, male    DOB: [birth year], 64 y.o.   MRN: [id number]  HPI  Patient is here for a BP recheck. Denies CP, SOB, headache, vision changes, or heart palpitations.  Review of Systems     Objective:   Physical Exam        Assessment & Plan:   Patients first BP reading is second is "

## 2024-08-12 NOTE — Progress Notes (Unsigned)
 This encounter was created in error - please disregard.  This encounter was created in error - please disregard.

## 2024-08-21 ENCOUNTER — Telehealth: Payer: Self-pay

## 2024-08-21 ENCOUNTER — Telehealth: Payer: Self-pay | Admitting: Medical-Surgical

## 2024-08-21 NOTE — Telephone Encounter (Signed)
 Copied from CRM [id number]. Topic: Clinical - Prescription Issue >> Aug 20, 2024  3:31 PM Antony RAMAN wrote: Reason for CRM: natalie from [employer] is calling to make sure patient is on a beta blocker since there is a diagnoses of heart failure. Cb- [phone]

## 2024-08-21 NOTE — Telephone Encounter (Unsigned)
 Copied from CRM [id number]. Topic: Clinical - Medication Refill >> Aug 21, 2024 12:20 PM Victoria B wrote: Medication: gabapentin  (NEURONTIN ) 300 MG capsule  Has the patient contacted their pharmacy? Pharmacy called patient (Agent: If no, request that the patient contact the pharmacy for the refill. If patient does not wish to contact the pharmacy document the reason why and proceed with request.) (Agent: If yes, when and what did the pharmacy advise?)contact pcp  This is the patient's preferred pharmacy:  [facility] - [place], [place] - [address] [place] [address] Phone: [phone] Fax: [phone]      Is this the correct pharmacy for this prescription? yes  Has the prescription been filled recently? no  Is the patient out of the medication? yes  Has the patient been seen for an appointment in the last year OR does the patient have an upcoming appointment? yes  Can we respond through MyChart? no  Agent: Please be advised that Rx refills may take up to 3 business days. We ask that you follow-up with your pharmacy.

## 2024-08-21 NOTE — Telephone Encounter (Signed)
 Requesting rx rf of Gabapentin  300mg   Last written 07/25/2024 90 day supply with 3 refills  To [facility] mail order   Attempted call to patient to make him aware of this. Left a voicemail message requesting a return call.

## 2024-08-22 ENCOUNTER — Telehealth: Payer: Self-pay | Admitting: Medical-Surgical

## 2024-08-22 MED ORDER — METOPROLOL SUCCINATE ER 25 MG PO TB24: 25.0000 mg | ORAL_TABLET | Freq: Every day | ORAL | 3 refills | Status: AC

## 2024-08-22 NOTE — Telephone Encounter (Signed)
 Pt called Kim back but she is in the room with a pt and she will have to call him back

## 2024-08-22 NOTE — Addendum Note (Signed)
 Addended byBETHA WILLO MINI on: 08/22/2024 08:22 PM   Modules accepted: Orders

## 2024-08-22 NOTE — Telephone Encounter (Signed)
 Patient informed that numerous medications were sent to [facility] pharmacy , Including GAbapentin  on 07/25/2024 . He states he was not aware of this and will call [facility] to check on this.

## 2024-08-22 NOTE — Telephone Encounter (Signed)
 Patient informed that numerous medications were sent to [facility] pharmacy  on 07/25/2024 . He states he was not aware of this and will call [facility] to check on this.

## 2024-08-22 NOTE — Telephone Encounter (Signed)
 Copied from CRM [id number]. Topic: Clinical - Prescription Issue >> Aug 22, 2024 12:26 PM Joesph B wrote: Reason for CRM: patient would like to have all his medications refilled. Patient requesting refills on current medications. Patient unable to provide medication names and states PCP is aware of current regimen. Please review medication list and advise on refills as appropriate. He states his seizure medicine goes to [facility].

## 2024-08-22 NOTE — Telephone Encounter (Signed)
 Pateint is agreeable to starting Toprol  XL if you feel he should be on this.

## 2024-09-02 ENCOUNTER — Telehealth: Payer: Self-pay

## 2024-09-02 NOTE — Telephone Encounter (Signed)
 Copied from CRM #[id number]. Topic: Clinical - Medication Question >> Sep 02, 2024 10:06 AM Rosaria BRAVO wrote: Reason for CRM: Odis from [facility] pharmacy called requesting to speak to someone from the clinic regarding pregabalin  (LYRICA ) 100 MG capsule  Needs to confirm whether the patient is supposed to take this or the gabapentin   Best contact: [phone]

## 2024-09-02 NOTE — Telephone Encounter (Signed)
 Is he on both?

## 2024-09-02 NOTE — Telephone Encounter (Signed)
 I was unable to update with pharmacy. I do not have Joy's NPI while working from home.

## 2024-09-03 NOTE — Telephone Encounter (Signed)
 [facility] advised.

## 2024-09-05 NOTE — Progress Notes (Signed)
 Joseph Grimes                                          MRN: [id number]   09/05/2024   The [facility] Quality Team Specialist reviewed this patient medical record for the purposes of chart review for care gap closure. The following were reviewed: chart review for care gap closure-glycemic status assessment.    [facility] Quality Team

## 2024-09-09 ENCOUNTER — Telehealth: Payer: Self-pay

## 2024-10-22 ENCOUNTER — Ambulatory Visit
